# Patient Record
Sex: Female | Born: 1955
Health system: Southern US, Community
[De-identification: ages and names within clinical notes are randomized; demographics above are authoritative.]

## PROBLEM LIST (undated history)

## (undated) DIAGNOSIS — G4700 Insomnia, unspecified: Secondary | ICD-10-CM

## (undated) DIAGNOSIS — C9030 Solitary plasmacytoma not having achieved remission: Secondary | ICD-10-CM

## (undated) DIAGNOSIS — I10 Essential (primary) hypertension: Secondary | ICD-10-CM

## (undated) DIAGNOSIS — E079 Disorder of thyroid, unspecified: Secondary | ICD-10-CM

## (undated) DIAGNOSIS — N289 Disorder of kidney and ureter, unspecified: Secondary | ICD-10-CM

## (undated) HISTORY — DX: Essential (primary) hypertension: I10

## (undated) HISTORY — DX: Disorder of thyroid, unspecified: E07.9

## (undated) HISTORY — DX: Disorder of kidney and ureter, unspecified: N28.9

## (undated) HISTORY — DX: Insomnia, unspecified: G47.00

---

## 1999-08-22 ENCOUNTER — Other Ambulatory Visit: Admission: RE | Admit: 1999-08-22 | Discharge: 1999-08-22 | Payer: Self-pay | Admitting: Family Medicine

## 2002-03-11 ENCOUNTER — Other Ambulatory Visit: Admission: RE | Admit: 2002-03-11 | Discharge: 2002-03-11 | Payer: Self-pay | Admitting: Family Medicine

## 2004-05-07 ENCOUNTER — Other Ambulatory Visit: Admission: RE | Admit: 2004-05-07 | Discharge: 2004-05-07 | Payer: Self-pay | Admitting: Family Medicine

## 2005-12-03 ENCOUNTER — Other Ambulatory Visit: Admission: RE | Admit: 2005-12-03 | Discharge: 2005-12-03 | Payer: Self-pay | Admitting: Family Medicine

## 2007-03-16 ENCOUNTER — Other Ambulatory Visit: Admission: RE | Admit: 2007-03-16 | Discharge: 2007-03-16 | Payer: Self-pay | Admitting: Family Medicine

## 2008-03-02 ENCOUNTER — Ambulatory Visit: Payer: Self-pay | Admitting: Internal Medicine

## 2008-03-02 ENCOUNTER — Other Ambulatory Visit: Admission: RE | Admit: 2008-03-02 | Discharge: 2008-03-02 | Payer: Self-pay | Admitting: Internal Medicine

## 2008-06-12 ENCOUNTER — Ambulatory Visit: Payer: Self-pay | Admitting: Internal Medicine

## 2009-08-21 ENCOUNTER — Other Ambulatory Visit: Admission: RE | Admit: 2009-08-21 | Discharge: 2009-08-21 | Payer: Self-pay | Admitting: Internal Medicine

## 2009-08-27 ENCOUNTER — Ambulatory Visit: Payer: Self-pay | Admitting: Internal Medicine

## 2010-07-09 LAB — HM MAMMOGRAPHY

## 2010-07-30 ENCOUNTER — Encounter: Payer: Self-pay | Admitting: Internal Medicine

## 2010-07-31 ENCOUNTER — Encounter: Payer: Self-pay | Admitting: Internal Medicine

## 2010-08-02 ENCOUNTER — Encounter: Payer: Self-pay | Admitting: Internal Medicine

## 2010-08-02 ENCOUNTER — Ambulatory Visit (INDEPENDENT_AMBULATORY_CARE_PROVIDER_SITE_OTHER): Payer: 59 | Admitting: Internal Medicine

## 2010-08-02 VITALS — BP 158/98 | HR 76 | Temp 99.2°F | Ht 68.0 in | Wt 181.0 lb

## 2010-08-02 DIAGNOSIS — E039 Hypothyroidism, unspecified: Secondary | ICD-10-CM

## 2010-08-02 DIAGNOSIS — IMO0001 Reserved for inherently not codable concepts without codable children: Secondary | ICD-10-CM

## 2010-08-02 DIAGNOSIS — R030 Elevated blood-pressure reading, without diagnosis of hypertension: Secondary | ICD-10-CM

## 2010-08-02 DIAGNOSIS — E669 Obesity, unspecified: Secondary | ICD-10-CM

## 2010-08-02 DIAGNOSIS — L0100 Impetigo, unspecified: Secondary | ICD-10-CM

## 2010-08-03 ENCOUNTER — Encounter: Payer: Self-pay | Admitting: Internal Medicine

## 2010-08-03 DIAGNOSIS — E039 Hypothyroidism, unspecified: Secondary | ICD-10-CM | POA: Insufficient documentation

## 2010-08-03 NOTE — Progress Notes (Signed)
  Subjective:    Patient ID: Jordan Gregory, female    DOB: 12/27/1955, 55 y.o.   MRN: [id number]  HPI patient with history of hypothyroidism currently on thyroid replacement therapy. She had some sun exposure on her nose and is concerned about a lesion on her nose that has not healed over the past couple of weeks. Initially thought it was just sun burn or a pimple but it did not improve and she became worried. Also her blood pressure is elevated today at 158/98. Has has not been watching her blood pressure at time but says parents have a home monitor that she can start using. Suggested she start taking blood pressure several times a week and bring me some readings in the next few weeks. She is also going to start some salt restriction. He is overweight.    Review of Systems     Objective:   Physical Exam chest clear to auscultation; cardiac exam regular rate and rhythm, normal S1/S2, extremities without edema.  Nasal bridge: Has abraded area mid nasal bridge with some exudate.        Assessment & Plan:   Impetigo of nasal bridge status post sun exposure  Probable essential hypertension-patient is to keep blood pressure readings for a few weeks and contact me with these readings  Hypothyroidism  Patient will be placed on doxycycline 100 mg twice daily for 10 days. Apply Bactroban ointment to the nasal bridge twice daily after cleaning with peroxide gently. If not better in 2 weeks will refer to dermatologist.

## 2010-09-02 ENCOUNTER — Other Ambulatory Visit: Payer: 59 | Admitting: Internal Medicine

## 2010-09-02 DIAGNOSIS — Z0000 Encounter for general adult medical examination without abnormal findings: Secondary | ICD-10-CM

## 2010-09-02 LAB — COMPREHENSIVE METABOLIC PANEL
ALT: 11 U/L (ref 0–35)
AST: 16 U/L (ref 0–37)
Albumin: 4.6 g/dL (ref 3.5–5.2)
Calcium: 9.4 mg/dL (ref 8.4–10.5)
Chloride: 107 mEq/L (ref 96–112)
Potassium: 4.4 mEq/L (ref 3.5–5.3)
Sodium: 141 mEq/L (ref 135–145)

## 2010-09-02 LAB — CBC WITH DIFFERENTIAL/PLATELET
Basophils Absolute: 0 10*3/uL (ref 0.0–0.1)
Lymphocytes Relative: 31 % (ref 12–46)
Neutro Abs: 2.6 10*3/uL (ref 1.7–7.7)
Platelets: 193 10*3/uL (ref 150–400)
RDW: 13.2 % (ref 11.5–15.5)
WBC: 4.6 10*3/uL (ref 4.0–10.5)

## 2010-09-02 LAB — LIPID PANEL
HDL: 74 mg/dL (ref >39)
LDL Cholesterol: 90 mg/dL (ref 0–99)

## 2010-09-02 LAB — VITAMIN D 25 HYDROXY (VIT D DEFICIENCY, FRACTURES): Vit D, 25-Hydroxy: 38 ng/mL (ref 30–89)

## 2010-09-03 ENCOUNTER — Encounter: Payer: Self-pay | Admitting: Internal Medicine

## 2010-09-03 ENCOUNTER — Ambulatory Visit (INDEPENDENT_AMBULATORY_CARE_PROVIDER_SITE_OTHER): Payer: 59 | Admitting: Internal Medicine

## 2010-09-03 DIAGNOSIS — I10 Essential (primary) hypertension: Secondary | ICD-10-CM

## 2010-09-03 DIAGNOSIS — E039 Hypothyroidism, unspecified: Secondary | ICD-10-CM

## 2010-09-03 DIAGNOSIS — Z0000 Encounter for general adult medical examination without abnormal findings: Secondary | ICD-10-CM

## 2010-09-03 DIAGNOSIS — G4700 Insomnia, unspecified: Secondary | ICD-10-CM

## 2010-09-03 LAB — POCT URINALYSIS DIPSTICK
Bilirubin, UA: NEGATIVE
Blood, UA: NEGATIVE
Glucose, UA: NEGATIVE
Nitrite, UA: NEGATIVE
Spec Grav, UA: 1.02
Urobilinogen, UA: NEGATIVE
pH, UA: 5

## 2010-09-05 ENCOUNTER — Encounter: Payer: Self-pay | Admitting: Internal Medicine

## 2010-09-05 DIAGNOSIS — G4700 Insomnia, unspecified: Secondary | ICD-10-CM | POA: Insufficient documentation

## 2010-09-05 NOTE — Progress Notes (Signed)
  Subjective:    Patient ID: Jordan Gregory, female    DOB: 1955-02-13, 55 y.o.   MRN: [id number]  HPI  55 year old white female with history of hypothyroidism. Occasionally takes Ambien for insomnia. History of fractured wrist, fractured clavicle and fractured leg: Childhood. Patient has a Masters degree and is married. Does not smoke. Social alcohol consumption. Exercise consists of walking 5 times a week for an hour or so. She does some cycling. Never had screening colonoscopy. Last mammogram July 2012. Tetanus immunization given February 2010. History of vitamin D deficiency.  Father with history of arrhythmia, mother with history of arthritis. 2 brothers in good health. No children.    Review of Systems  Constitutional: Negative.   HENT: Negative.   Eyes: Negative.   Respiratory: Negative.   Cardiovascular: Negative.   Gastrointestinal: Negative.   Genitourinary: Negative.   Musculoskeletal: Negative.   Skin: Negative.   Neurological: Negative.   Hematological: Negative.   Psychiatric/Behavioral: Negative.        Objective:   Physical Exam  Constitutional: She is oriented to person, place, and time. She appears well-developed and well-nourished. No distress.  HENT:  Head: Normocephalic and atraumatic.  Right Ear: External ear normal.  Left Ear: External ear normal.  Mouth/Throat: Oropharynx is clear and moist.  Eyes: Conjunctivae and EOM are normal. Pupils are equal, round, and reactive to light. No scleral icterus.  Neck: Neck supple. No JVD present. No thyromegaly present.  Cardiovascular: Normal rate, regular rhythm, normal heart sounds and intact distal pulses.   Pulmonary/Chest: Effort normal and breath sounds normal. No respiratory distress. She has no rales.  Abdominal: Soft. Bowel sounds are normal. She exhibits no mass. There is no tenderness.  Genitourinary:       Last Pap 2011. Pap not done today  Musculoskeletal: She exhibits no edema.  Lymphadenopathy:      She has no cervical adenopathy.  Neurological: She is alert and oriented to person, place, and time. She has normal reflexes. No cranial nerve deficit.  Skin: Skin is warm and dry. No rash noted.  Psychiatric: She has a normal mood and affect. Her behavior is normal. Thought content normal.          Assessment & Plan:  Hypothyroidism  Insomnia  Refill Ambien 10 mg (#30 (1/2-1 by mouth each bedtime with one refill. Refill levothyroxin 0.05 mg daily for 6 months. Return in 6 months for office visit and recheck TSH. Patient is to let he know when she wants to have screening colonoscopy.  Hypertension patient brings in multiple blood pressure readings which have been elevated from 138-147 systolically over the last 2 weeks and diastolically from 87-104. We're going to start her on Altace 5 mg daily and reevaluate in 4-6 weeks with office visit and basic metabolic panel.

## 2010-10-03 ENCOUNTER — Encounter: Payer: Self-pay | Admitting: Internal Medicine

## 2010-10-03 ENCOUNTER — Ambulatory Visit (INDEPENDENT_AMBULATORY_CARE_PROVIDER_SITE_OTHER): Payer: 59 | Admitting: Internal Medicine

## 2010-10-03 VITALS — BP 134/82 | HR 76 | Resp 16 | Ht 68.0 in | Wt 178.0 lb

## 2010-10-03 DIAGNOSIS — I10 Essential (primary) hypertension: Secondary | ICD-10-CM | POA: Insufficient documentation

## 2010-10-03 DIAGNOSIS — R002 Palpitations: Secondary | ICD-10-CM

## 2010-10-03 LAB — BASIC METABOLIC PANEL
BUN: 21 mg/dL (ref 6–23)
Chloride: 102 mEq/L (ref 96–112)
Creat: 0.99 mg/dL (ref 0.50–1.10)
Glucose, Bld: 87 mg/dL (ref 70–99)
Potassium: 4.4 mEq/L (ref 3.5–5.3)

## 2010-10-03 NOTE — Progress Notes (Signed)
  Subjective:    Patient ID: Jordan Gregory, female    DOB: 07/15/1955, 55 y.o.   MRN: [id number]  HPI 55 year old white female with history of hypothyroidism, vitamin D deficiency, insomnia recently seen for elevated blood pressures and placed on Altace 5 mg daily. Returns today for followup. Says blood pressure at home and hasn't changed all that much. She's also had some issues with palpitations recently. This is of concern for her because in January she is planning to go to [place] for a three-week trip where she will be riding a bike 30 miles a day in the remote part of the country. No chest pain. No shortness of breath. Palpitations are self-limited. Not related to caffeine consumption or stress that she is aware of. EKG performed today. She is on thyroid replacement therapy consisting of levothyroxine 0.05 mg daily.    Review of Systems     Objective:   Physical Exam neck is supple without bruits or thyromegaly; chest clear; cardiac exam regular rate and rhythm; extremities without edema        Assessment & Plan:  Hypertension  Palpitations  Patient is considerably alarmed about palpitations and 1.2 refer her to cardiologist for further evaluation. This is because of her upcoming trip out of the country for an extended period of time.

## 2010-10-04 ENCOUNTER — Telehealth: Payer: Self-pay

## 2010-10-04 DIAGNOSIS — Z1211 Encounter for screening for malignant neoplasm of colon: Secondary | ICD-10-CM

## 2010-10-04 DIAGNOSIS — R002 Palpitations: Secondary | ICD-10-CM

## 2010-10-04 NOTE — Telephone Encounter (Signed)
Ambulatory ref to Cardiology and Gastroenterology made

## 2010-10-14 ENCOUNTER — Encounter: Payer: Self-pay | Admitting: Gastroenterology

## 2010-10-29 ENCOUNTER — Ambulatory Visit (AMBULATORY_SURGERY_CENTER): Payer: 59

## 2010-10-29 VITALS — Ht 68.0 in | Wt 180.8 lb

## 2010-10-29 DIAGNOSIS — Z1211 Encounter for screening for malignant neoplasm of colon: Secondary | ICD-10-CM

## 2010-10-29 DIAGNOSIS — Z8371 Family history of colonic polyps: Secondary | ICD-10-CM

## 2010-10-29 MED ORDER — PEG-KCL-NACL-NASULF-NA ASC-C 100 G PO SOLR: 1.0000 | Freq: Once | ORAL | Status: AC

## 2010-10-30 ENCOUNTER — Encounter: Payer: Self-pay | Admitting: Gastroenterology

## 2010-11-07 ENCOUNTER — Telehealth: Payer: Self-pay | Admitting: Internal Medicine

## 2010-11-07 NOTE — Telephone Encounter (Signed)
Phoned in ramipril 5 mg #90 with 1 additional refill to [facility]

## 2010-11-07 NOTE — Telephone Encounter (Signed)
At the end of the call, the patient stated she had ran out of medication 3 days ago and the pharmacy had given her 3 pills.  Stated the pharmacist told her they had been trying to reach our office without success.  Advised patient we had not been contacted by pharmacy and that she was the only person who had tried to contact us for a refill and that was today.

## 2010-11-11 ENCOUNTER — Encounter: Payer: Self-pay | Admitting: Gastroenterology

## 2010-11-11 ENCOUNTER — Ambulatory Visit (AMBULATORY_SURGERY_CENTER): Payer: 59 | Admitting: Gastroenterology

## 2010-11-11 DIAGNOSIS — Z1211 Encounter for screening for malignant neoplasm of colon: Secondary | ICD-10-CM

## 2010-11-11 DIAGNOSIS — Z8371 Family history of colonic polyps: Secondary | ICD-10-CM

## 2010-11-11 MED ORDER — SODIUM CHLORIDE 0.9 % IV SOLN: 500.0000 mL | INTRAVENOUS | Status: DC

## 2010-11-11 NOTE — Patient Instructions (Signed)
Please refer to the blue and neon green sheets for instructions regarding diet and activity for the rest of today.  Resume previous medications. 

## 2010-11-12 ENCOUNTER — Telehealth: Payer: Self-pay

## 2010-11-12 NOTE — Telephone Encounter (Signed)
No ID on answering machine. 

## 2011-01-08 ENCOUNTER — Other Ambulatory Visit: Payer: Self-pay | Admitting: Internal Medicine

## 2011-01-10 ENCOUNTER — Other Ambulatory Visit: Payer: Self-pay

## 2011-01-10 MED ORDER — ZOLPIDEM TARTRATE 10 MG PO TABS: 5.0000 mg | ORAL_TABLET | Freq: Every evening | ORAL | Status: DC | PRN

## 2011-04-26 ENCOUNTER — Other Ambulatory Visit: Payer: Self-pay | Admitting: Internal Medicine

## 2011-04-29 ENCOUNTER — Encounter: Payer: Self-pay | Admitting: Internal Medicine

## 2011-04-29 ENCOUNTER — Ambulatory Visit (INDEPENDENT_AMBULATORY_CARE_PROVIDER_SITE_OTHER): Payer: 59 | Admitting: Internal Medicine

## 2011-04-29 VITALS — BP 146/84 | HR 78 | Resp 16 | Ht 68.0 in | Wt 175.0 lb

## 2011-04-29 DIAGNOSIS — R002 Palpitations: Secondary | ICD-10-CM

## 2011-04-29 DIAGNOSIS — E039 Hypothyroidism, unspecified: Secondary | ICD-10-CM

## 2011-04-29 LAB — TSH: TSH: 2.315 u[IU]/mL (ref 0.350–4.500)

## 2011-04-29 NOTE — Patient Instructions (Addendum)
Thyroid functions have been checked today. We will make a referral to cardiologist regarding recurrent palpitations.

## 2011-05-04 ENCOUNTER — Encounter: Payer: Self-pay | Admitting: Internal Medicine

## 2011-05-04 NOTE — Progress Notes (Signed)
  Subjective:    Patient ID: Jordan Gregory, female    DOB: 1955/12/17, 56 y.o.   MRN: [id number]  HPI  56 year old white female in today complaining of some chest tightness and palpitations. Was seen September 2012 for palpitations. EKG at that time showed left axis deviation and borderline first degree AV block. Patient is concerned because she has family history of heart disease in her father. Seems a bit anxious. Is not taking over-the-counter decongestants, drinking alcohol or lots of caffeine. Is somewhat vague about history of palpitations and chest tightness. Is across her chest. Does not go into neck or down left arm. No nausea with this chest discomfort.      Review of Systems     Objective:   Physical Exam neck is supple without JVD thyromegaly or carotid bruits; chest clear to auscultation. Cardiac exam regular rate and rhythm normal S1 and S2 without murmurs gallops rubs or clicks; extremities without edema. EKG shows sinus rhythm rate of 80. PR interval 0.20, QRS 0.06, QT interval 0.36. Axis is 0.        Assessment & Plan:  Palpitations  Chest tightness  Plan: I think patient has some underlying anxiety causing the symptoms however she does have family history of heart disease in her father. We will refer her to cardiologist for further evaluation.

## 2011-05-12 ENCOUNTER — Ambulatory Visit (INDEPENDENT_AMBULATORY_CARE_PROVIDER_SITE_OTHER): Payer: 59 | Admitting: Cardiovascular Disease

## 2011-05-12 ENCOUNTER — Encounter: Payer: Self-pay | Admitting: Cardiovascular Disease

## 2011-05-12 VITALS — BP 154/89 | HR 72 | Ht 68.0 in | Wt 179.0 lb

## 2011-05-12 DIAGNOSIS — E039 Hypothyroidism, unspecified: Secondary | ICD-10-CM

## 2011-05-12 DIAGNOSIS — R079 Chest pain, unspecified: Secondary | ICD-10-CM

## 2011-05-12 DIAGNOSIS — R002 Palpitations: Secondary | ICD-10-CM

## 2011-05-12 DIAGNOSIS — I10 Essential (primary) hypertension: Secondary | ICD-10-CM

## 2011-05-12 NOTE — Assessment & Plan Note (Signed)
Continue synthroid.  TSH normal on labs reviewed 3/26 at [facility] office

## 2011-05-12 NOTE — Progress Notes (Signed)
Patient ID: Jordan Gregory, female   DOB: 08-27-1955, 56 y.o.   MRN: [id number] Referred by Dr Lenord Fellers.  HPI 56 year old white female in today complaining of some chest tightness and palpitations. Was seen September 2012 for palpitations. EKG at that time showed left axis deviation and borderline first degree AV block. Patient is concerned because she has family history of heart disease in her father. Seems a bit anxious. Is not taking over-the-counter decongestants, drinking alcohol or lots of caffeine. Is somewhat vague about history of palpitations and chest tightness. Is across her chest. Does not go into neck or down left arm. No nausea with this chest discomfort. She rides her bike and walks 3-4/week.  No correlation with SSCP.  Palpitations more at night and can wake her from sleep.  Has not had ETT before  BP Rx with meds over a year.  Previous patient of Dr Artis Flock  Works at [employer] and [employer]  ROS: Denies fever, malais, weight loss, blurry vision, decreased visual acuity, cough, sputum, SOB, hemoptysis, pleuritic pain, palpitaitons, heartburn, abdominal pain, melena, lower extremity edema, claudication, or rash.  All other systems reviewed and negative   General: Affect appropriate Healthy:  appears stated age HEENT: normal Neck supple with no adenopathy JVP normal no bruits no thyromegaly Lungs clear with no wheezing and good diaphragmatic motion Heart:  S1/S2 no murmur,rub, gallop or click PMI normal Abdomen: benighn, BS positve, no tenderness, no AAA no bruit.  No HSM or HJR Distal pulses intact with no bruits No edema Neuro non-focal Skin warm and dry No muscular weakness  Medications Current Outpatient Prescriptions  Medication Sig Dispense Refill  . ramipril (ALTACE) 5 MG capsule TAKE 1 CAPSULE BY MOUTH DAILY  90 capsule  1  . SYNTHROID 50 MCG tablet TAKE 1 TABLET EVERY DAY  90 tablet  3  . VITAMIN D, CHOLECALCIFEROL, PO Take 2,000 Int'l Units by mouth daily.        Marland Kitchen zolpidem (AMBIEN) 10 MG tablet Take 0.5 tablets (5 mg total) by mouth at bedtime as needed.  30 tablet  2    Allergies Review of patient's allergies indicates no known allergies.  Family History: Family History  Problem Relation Age of Onset  . Arthritis Mother   . Heart disease Father     Social History: History   Social History  . Marital Status: Married    Spouse Name: N/A    Number of Children: N/A  . Years of Education: N/A   Occupational History  . Not on file.   Social History Main Topics  . Smoking status: Never Smoker   . Smokeless tobacco: Never Used  . Alcohol Use: 7.0 oz/week    14 drink(s) per week     social  . Drug Use: No  . Sexually Active: Not on file   Other Topics Concern  . Not on file   Social History Narrative  . No narrative on file    Electrocardiogram:  NSR rate 72 normal ECG  Assessment and Plan

## 2011-05-12 NOTE — Assessment & Plan Note (Signed)
Atypical and anxiety may play a role. ECG no acute changes.  F/U stress echo

## 2011-05-12 NOTE — Assessment & Plan Note (Signed)
Benign no need for monitor unless stress echo abnormal

## 2011-05-12 NOTE — Patient Instructions (Signed)
Your physician recommends that you schedule a follow-up appointment in: AS NEEDED Your physician recommends that you continue on your current medications as directed. Please refer to the Current Medication list given to you today. Your physician has requested that you have a stress echocardiogram. For further information please visit [email or web address] Please follow instruction sheet as given. DX PALPITATIONS

## 2011-05-12 NOTE — Assessment & Plan Note (Signed)
Well controlled.  Continue current medications and low sodium Dash type diet.   Continue ACE/ARB

## 2011-05-29 ENCOUNTER — Ambulatory Visit (HOSPITAL_COMMUNITY): Payer: 59 | Attending: Cardiovascular Disease

## 2011-05-29 ENCOUNTER — Encounter: Payer: Self-pay | Admitting: Cardiovascular Disease

## 2011-05-29 DIAGNOSIS — R002 Palpitations: Secondary | ICD-10-CM | POA: Insufficient documentation

## 2011-05-29 DIAGNOSIS — I10 Essential (primary) hypertension: Secondary | ICD-10-CM | POA: Insufficient documentation

## 2011-05-29 DIAGNOSIS — R072 Precordial pain: Secondary | ICD-10-CM | POA: Insufficient documentation

## 2011-06-04 ENCOUNTER — Telehealth: Payer: Self-pay | Admitting: Cardiovascular Disease

## 2011-06-04 NOTE — Telephone Encounter (Signed)
[phone] PT RTN CALL RE STRESS TEST RESULTS

## 2011-06-04 NOTE — Telephone Encounter (Signed)
PT  AWARE OF STRESS ECHO RESULTS ./CY 

## 2011-08-01 ENCOUNTER — Encounter: Payer: Self-pay | Admitting: Internal Medicine

## 2011-08-01 ENCOUNTER — Ambulatory Visit (INDEPENDENT_AMBULATORY_CARE_PROVIDER_SITE_OTHER): Payer: 59 | Admitting: Internal Medicine

## 2011-08-01 VITALS — BP 148/90 | HR 80 | Temp 98.8°F | Ht 68.0 in | Wt 180.0 lb

## 2011-08-01 DIAGNOSIS — N390 Urinary tract infection, site not specified: Secondary | ICD-10-CM

## 2011-08-01 LAB — POCT URINALYSIS DIPSTICK
Bilirubin, UA: NEGATIVE
Glucose, UA: NEGATIVE
Ketones, UA: NEGATIVE
Nitrite, UA: NEGATIVE
pH, UA: 6

## 2011-08-01 NOTE — Addendum Note (Signed)
Addended by: Judy Pimple on: 08/01/2011 05:22 PM   Modules accepted: Orders

## 2011-08-01 NOTE — Progress Notes (Signed)
  Subjective:    Patient ID: Jordan Gregory, female    DOB: [birth year], 56 y.o.   MRN: [id number]  HPI 56 year old white female with onset of dysuria and urinary frequency on Sunday, July 21. She drank a lot of cranberry juice and thought she was getting better. H however, her symptoms have returned. She took some Azo-Standard over-the-counter for dysuria. No fever nausea vomiting or shaking chills. No back pain.    Review of Systems     Objective:   Physical Exam no CVA tenderness. Chest clear to auscultation. Cardiac exam regular rate and rhythm. Urine is abnormal. Culture taken.       Assessment & Plan:  Urinary tract infection  Plan: Cipro 250 mg twice daily for 10 days. Await culture results.

## 2011-08-01 NOTE — Patient Instructions (Addendum)
Take Cipro twice daily for 10 days. Call if not better in 48 hours or sooner if worse. Culture is pending.

## 2011-08-04 LAB — URINE CULTURE: Colony Count: >=100000

## 2011-08-15 ENCOUNTER — Other Ambulatory Visit: Payer: Self-pay

## 2011-08-15 MED ORDER — ZOLPIDEM TARTRATE 10 MG PO TABS: 10.0000 mg | ORAL_TABLET | Freq: Every evening | ORAL | Status: DC | PRN

## 2011-11-09 ENCOUNTER — Other Ambulatory Visit: Payer: Self-pay | Admitting: Internal Medicine

## 2012-01-02 NOTE — Addendum Note (Signed)
Addended by: Judy Pimple on: 01/02/2012 12:33 PM   Modules accepted: Orders

## 2012-01-26 ENCOUNTER — Encounter: Payer: Self-pay | Admitting: Radiology

## 2012-02-04 ENCOUNTER — Other Ambulatory Visit: Payer: Self-pay | Admitting: Internal Medicine

## 2012-02-04 NOTE — Telephone Encounter (Signed)
Last CPE 8/12. Last TSH 4/13. Needs OV and TSH check and can book PE after that.

## 2012-02-05 ENCOUNTER — Telehealth: Payer: Self-pay

## 2012-02-05 MED ORDER — SYNTHROID 50 MCG PO TABS: 50.0000 ug | ORAL_TABLET | Freq: Every day | ORAL | Status: DC

## 2012-02-05 NOTE — Telephone Encounter (Signed)
Note done

## 2012-02-11 ENCOUNTER — Other Ambulatory Visit: Payer: Self-pay

## 2012-02-11 MED ORDER — SYNTHROID 50 MCG PO TABS: 50.0000 ug | ORAL_TABLET | Freq: Every day | ORAL | Status: DC

## 2012-03-16 ENCOUNTER — Other Ambulatory Visit: Payer: 59 | Admitting: Internal Medicine

## 2012-03-16 DIAGNOSIS — E039 Hypothyroidism, unspecified: Secondary | ICD-10-CM

## 2012-03-16 DIAGNOSIS — Z0000 Encounter for general adult medical examination without abnormal findings: Secondary | ICD-10-CM

## 2012-03-16 LAB — LIPID PANEL
Cholesterol: 191 mg/dL (ref 0–200)
VLDL: 11 mg/dL (ref 0–40)

## 2012-03-16 LAB — CBC WITH DIFFERENTIAL/PLATELET
Hemoglobin: 13.4 g/dL (ref 12.0–15.0)
Lymphocytes Relative: 33 % (ref 12–46)
Lymphs Abs: 1.2 10*3/uL (ref 0.7–4.0)
Neutrophils Relative %: 53 % (ref 43–77)
Platelets: 219 10*3/uL (ref 150–400)
RBC: 4.63 MIL/uL (ref 3.87–5.11)
WBC: 3.7 10*3/uL — ABNORMAL LOW (ref 4.0–10.5)

## 2012-03-16 LAB — COMPREHENSIVE METABOLIC PANEL
ALT: 14 U/L (ref 0–35)
Albumin: 4.6 g/dL (ref 3.5–5.2)
CO2: 28 mEq/L (ref 19–32)
Calcium: 9.2 mg/dL (ref 8.4–10.5)
Chloride: 105 mEq/L (ref 96–112)
Potassium: 4.1 mEq/L (ref 3.5–5.3)
Sodium: 141 mEq/L (ref 135–145)
Total Protein: 6.6 g/dL (ref 6.0–8.3)

## 2012-03-17 LAB — VITAMIN D 25 HYDROXY (VIT D DEFICIENCY, FRACTURES): Vit D, 25-Hydroxy: 53 ng/mL (ref 30–89)

## 2012-03-18 ENCOUNTER — Ambulatory Visit: Payer: 59 | Admitting: Internal Medicine

## 2012-03-18 ENCOUNTER — Other Ambulatory Visit (HOSPITAL_COMMUNITY)
Admission: RE | Admit: 2012-03-18 | Discharge: 2012-03-18 | Disposition: A | Discharge status: Home / Self Care | Payer: 59 | Source: Ambulatory Visit | Attending: Internal Medicine | Admitting: Internal Medicine

## 2012-03-18 ENCOUNTER — Encounter: Payer: Self-pay | Admitting: Internal Medicine

## 2012-03-18 VITALS — BP 140/90 | HR 76 | Ht 67.25 in

## 2012-03-18 DIAGNOSIS — Z01419 Encounter for gynecological examination (general) (routine) without abnormal findings: Secondary | ICD-10-CM | POA: Insufficient documentation

## 2012-03-18 DIAGNOSIS — M25559 Pain in unspecified hip: Secondary | ICD-10-CM

## 2012-03-18 DIAGNOSIS — M25551 Pain in right hip: Secondary | ICD-10-CM

## 2012-03-18 DIAGNOSIS — I10 Essential (primary) hypertension: Secondary | ICD-10-CM

## 2012-03-18 DIAGNOSIS — R21 Rash and other nonspecific skin eruption: Secondary | ICD-10-CM

## 2012-03-18 DIAGNOSIS — E039 Hypothyroidism, unspecified: Secondary | ICD-10-CM

## 2012-03-18 DIAGNOSIS — Z0000 Encounter for general adult medical examination without abnormal findings: Secondary | ICD-10-CM

## 2012-03-18 DIAGNOSIS — L259 Unspecified contact dermatitis, unspecified cause: Secondary | ICD-10-CM

## 2012-03-18 LAB — POCT URINALYSIS DIPSTICK
Bilirubin, UA: NEGATIVE
Glucose, UA: NEGATIVE
Leukocytes, UA: NEGATIVE
Nitrite, UA: NEGATIVE
pH, UA: 5.5

## 2012-03-18 NOTE — Patient Instructions (Addendum)
Prescription written 3 weeks of physical therapy for right hip pain. For rash on the upper back use triamcinolone cream twice daily. Continue same antihypertensive medication. TSH is normal and continue thyroid replacement therapy. Cough may be caused by your antihypertensive medication. Change to Cozaar 50 mg daily and return in 3 weeks.

## 2012-03-19 ENCOUNTER — Other Ambulatory Visit: Payer: Self-pay | Admitting: Internal Medicine

## 2012-03-19 NOTE — Telephone Encounter (Signed)
Refill for 6 months. 

## 2012-04-08 ENCOUNTER — Encounter: Payer: Self-pay | Admitting: Internal Medicine

## 2012-04-08 ENCOUNTER — Ambulatory Visit (INDEPENDENT_AMBULATORY_CARE_PROVIDER_SITE_OTHER): Payer: 59 | Admitting: Internal Medicine

## 2012-04-08 VITALS — BP 130/70 | Temp 98.7°F | Wt 184.0 lb

## 2012-04-08 DIAGNOSIS — I10 Essential (primary) hypertension: Secondary | ICD-10-CM

## 2012-04-08 LAB — BASIC METABOLIC PANEL
BUN: 17 mg/dL (ref 6–23)
CO2: 26 mEq/L (ref 19–32)
Glucose, Bld: 96 mg/dL (ref 70–99)
Potassium: 3.7 mEq/L (ref 3.5–5.3)

## 2012-04-08 MED ORDER — LOSARTAN POTASSIUM 50 MG PO TABS: 50.0000 mg | ORAL_TABLET | Freq: Every day | ORAL | Status: DC

## 2012-04-08 NOTE — Progress Notes (Signed)
  Subjective:    Patient ID: Jordan Gregory, female    DOB: 28-Mar-1955, 57 y.o.   MRN: [id number]  HPI  At last visit, changed from ramipril  to Cozaar 50 mg daily because of cough. In today for followup on hypertension on Cozaar. Tolerating Cozaar fine without side effects. Drew B-met today. Still having bilateral hip pain. Has order for physical therapy but has not gone.    Review of Systems     Objective:   Physical Exam neck is supple without JVD thyromegaly or carotid bruits. Chest clear to auscultation. Cardiac exam regular rate and rhythm normal S1 and S2. Extremities without edema.        Assessment & Plan:  Hypertension-well controlled on Cozaar 50 mg daily.  Intolerant to Ramipril because of cough  Bilateral hip pain-previously given prescription for physical therapy  Plan: Return in 6 months. B- met drawn.  Time spent with patient: 15 minutes

## 2012-04-08 NOTE — Patient Instructions (Addendum)
Continue Cozaar and return in 6 months.

## 2012-04-21 ENCOUNTER — Other Ambulatory Visit: Payer: Self-pay

## 2012-04-21 MED ORDER — SYNTHROID 50 MCG PO TABS: 50.0000 ug | ORAL_TABLET | Freq: Every day | ORAL | Status: DC

## 2012-05-10 ENCOUNTER — Other Ambulatory Visit: Payer: Self-pay

## 2012-05-10 DIAGNOSIS — I10 Essential (primary) hypertension: Secondary | ICD-10-CM

## 2012-05-10 MED ORDER — LOSARTAN POTASSIUM 50 MG PO TABS: 50.0000 mg | ORAL_TABLET | Freq: Every day | ORAL | Status: DC

## 2012-07-20 ENCOUNTER — Encounter: Payer: Self-pay | Admitting: Internal Medicine

## 2012-07-20 ENCOUNTER — Ambulatory Visit (INDEPENDENT_AMBULATORY_CARE_PROVIDER_SITE_OTHER): Payer: 59 | Admitting: Internal Medicine

## 2012-07-20 VITALS — BP 140/84 | HR 68 | Temp 99.0°F | Wt 182.0 lb

## 2012-07-20 DIAGNOSIS — R109 Unspecified abdominal pain: Secondary | ICD-10-CM

## 2012-07-20 DIAGNOSIS — R319 Hematuria, unspecified: Secondary | ICD-10-CM

## 2012-07-20 DIAGNOSIS — R1012 Left upper quadrant pain: Secondary | ICD-10-CM

## 2012-07-20 DIAGNOSIS — F411 Generalized anxiety disorder: Secondary | ICD-10-CM

## 2012-07-20 DIAGNOSIS — R1032 Left lower quadrant pain: Secondary | ICD-10-CM

## 2012-07-20 LAB — POCT URINALYSIS DIPSTICK
Bilirubin, UA: NEGATIVE
Ketones, UA: NEGATIVE
Protein, UA: NEGATIVE
Spec Grav, UA: 1.01
pH, UA: 5.5

## 2012-07-20 NOTE — Patient Instructions (Addendum)
Advance diet slowly. Call If  symptoms persist.

## 2012-07-20 NOTE — Progress Notes (Signed)
  Subjective:    Patient ID: Jordan Gregory, female    DOB: [birth year], 57 y.o.   MRN: [id number]  HPI  Patient noticed some discomfort last week on Friday, July 11. Had some epigastric and left-sided abdominal pain extending from her left diaphragm area down to her left lower quadrant. Had some burping. Had decreased appetite. Had no diarrhea or nausea. Bowel movements were essentially normal. In late June she finished physical therapy for back and hip pain. She has been doing some exercises and thought she may have had a muscle strain in her left abdomen. Recently a close friend was diagnosed with stage IV ovarian cancer. This has her concerned.  No urinary symptoms. She is menopausal. She had a Pap smear which was normal in March 2014.    Review of Systems     Objective:   Physical Exam Chest is clear to auscultation. No palpable left anterior chest wall tenderness. Abdomen is soft nondistended. Bowel sounds are decreased. No hepatosplenomegaly masses or significant tenderness. Bimanual exam is normal without masses. Rectovaginal confirms. No stool to guaiac. Skin is warm and dry. She seems anxious. Blood pressure is elevated today I assume because of anxiety      Assessment & Plan:  Vague epigastric and left abdominal discomfort. Etiology unclear. May have element of GE reflux and element of musculoskeletal pain. Also seems to have an element of anxiety.  Anxiety  Elevated blood pressure likely secondary to anxiety continue to monitor  Plan: Patient was reassured no masses or palpable. She is going to advance diet slowly over the next few days and advise if symptoms do not resolve. Says she feels better today than last week. Right now, do not see a reason to order ultrasound of the ovaries/CT of abdomen. Would like to see if patient improves with conservative therapy. If she's having reflux symptoms may take Zantac or Nexium over-the-counter. Patient has abnormal urine dipstick. We'll  send for microscopic urine and culture. Urine in March 2014 was normal.  25 minutes spent with patient

## 2012-07-21 LAB — URINALYSIS, ROUTINE W REFLEX MICROSCOPIC
Bilirubin Urine: NEGATIVE
Glucose, UA: NEGATIVE mg/dL
Ketones, ur: NEGATIVE mg/dL
Specific Gravity, Urine: 1.011 (ref 1.005–1.030)
pH: 5.5 (ref 5.0–8.0)

## 2012-07-21 LAB — URINALYSIS, MICROSCOPIC ONLY
Crystals: NONE SEEN
Squamous Epithelial / LPF: NONE SEEN

## 2012-07-22 LAB — URINE CULTURE: Organism ID, Bacteria: NO GROWTH

## 2012-08-01 ENCOUNTER — Encounter: Payer: Self-pay | Admitting: Internal Medicine

## 2012-08-01 NOTE — Progress Notes (Signed)
Subjective:    Patient ID: Jordan Gregory, female    DOB: Jan 09, 1955, 57 y.o.   MRN: [id number]  HPI 57 year old White female in today for health maintenance and evaluation of medical problems including hypertension and hypothyroidism. History of insomnia and palpitations. Patient says she is unable to tolerate Ramipril because of cough. History of palpitations  evaluated by Dr. Eden Emms in the past and thought to be benign. Had negative stress echocardiogram with his evaluation. Last Pap smear 2011. Last mammogram  January 2014.  Colonoscopy by Dr. Jarold Motto 2012. No polyps identified. 10 year followup recommended.  Hypertension diagnosed in 2012. History of insomnia and Vitamin D deficiency.  Had tetanus immunization February 2010  No known drug allergies  History of fractured wrist, fractured clavicle and fractured leg all in  Childhood.  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 She is married. No children. Does not smoke. Social alcohol consumption. Exercise consists of walking about an hour 5 times a week and she does some cycling.  Family history: Father with history of arrhythmia. Mother with history of arthritis. 2 brothers in good health.  History of right shoulder pain evaluated by Dr. Teressa Senter in 2009 showing type II acromion with impingement. Physical therapy recommended.  New complaints today: Right hip pain and nonspecific rash upper back   Review of Systems  Constitutional: Negative.   HENT: Negative.   Respiratory:       Has developed cough on Altace  Cardiovascular:       History of palpitations thought to be benign  Gastrointestinal: Negative.   Endocrine:       History of hypothyroidism  Genitourinary:       Menopausal  Psychiatric/Behavioral:       History of insomnia       Objective:   Physical Exam  Vitals reviewed. Constitutional: She is oriented to person, place, and time. She appears well-developed and  well-nourished. No distress.  HENT:  Head: Normocephalic and atraumatic.  Right Ear: External ear normal.  Left Ear: External ear normal.  Mouth/Throat: Oropharynx is clear and moist. No oropharyngeal exudate.  Eyes: Conjunctivae and EOM are normal. Pupils are equal, round, and reactive to light. Right eye exhibits no discharge. Left eye exhibits no discharge. No scleral icterus.  Neck: Neck supple. No JVD present. No thyromegaly present.  Cardiovascular: Normal rate, regular rhythm and normal heart sounds.   No murmur heard. Pulmonary/Chest: Effort normal and breath sounds normal. No respiratory distress. She has no wheezes. She has no rales. She exhibits no tenderness.  Breasts normal female  Abdominal: Soft. Bowel sounds are normal. She exhibits no distension and no mass. There is no tenderness. There is no rebound and no guarding.  Genitourinary: No vaginal discharge found.  Pap taken no masses on bimanual  Musculoskeletal: Normal range of motion. She exhibits no edema and no tenderness.  Good range of motion right hip but pain with rotation  Lymphadenopathy:    She has no cervical adenopathy.  Neurological: She is alert and oriented to person, place, and time. She has normal reflexes. No cranial nerve deficit. Coordination normal.  Skin: Skin is warm and dry. Rash noted. She is not diaphoretic.  Upper back scattered fine macular papular rash consistent with folliculitis  Psychiatric: She has a normal mood and affect. Her behavior is normal. Judgment and thought content normal.          Assessment & Plan:  Cough on Altace-change to Cozaar 50  mg daily  Hypothyroidism-continue same dose of Synthroid and recheck in 6 months  History of vitamin D deficiency  History of palpitations previously seen by cardiologist and thought to be benign  History of insomnia  History of hypertension diagnosed 2012  History of type II acromion with impingement 2009  Nonspecific  dermatitis-use triamcinolone cream upper back 3 times daily  Right hip pain-musculoskeletal-prescription given for physical therapy for 3 weeks  Plan: Recheck blood pressure on Cozaar 50 mg daily in 3 weeks and recheck hip pain at that time

## 2012-08-07 ENCOUNTER — Ambulatory Visit (INDEPENDENT_AMBULATORY_CARE_PROVIDER_SITE_OTHER): Payer: 59 | Admitting: Internal Medicine

## 2012-08-07 ENCOUNTER — Ambulatory Visit: Payer: 59

## 2012-08-07 VITALS — BP 135/72 | HR 71 | Temp 98.6°F | Resp 18 | Ht 67.5 in | Wt 183.0 lb

## 2012-08-07 DIAGNOSIS — K921 Melena: Secondary | ICD-10-CM

## 2012-08-07 DIAGNOSIS — R1032 Left lower quadrant pain: Secondary | ICD-10-CM

## 2012-08-07 DIAGNOSIS — N950 Postmenopausal bleeding: Secondary | ICD-10-CM

## 2012-08-07 DIAGNOSIS — R1013 Epigastric pain: Secondary | ICD-10-CM

## 2012-08-07 LAB — POCT CBC
HCT, POC: 46.6 % (ref 37.7–47.9)
Hemoglobin: 15.1 g/dL (ref 12.2–16.2)
Lymph, poc: 1 (ref 0.6–3.4)
MCH, POC: 30.1 pg (ref 27–31.2)
MCHC: 32.4 g/dL (ref 31.8–35.4)
MCV: 92.8 fL (ref 80–97)
POC Granulocyte: 3.1 (ref 2–6.9)
POC LYMPH PERCENT: 22.9 %L (ref 10–50)
RDW, POC: 13.4 %
WBC: 4.4 10*3/uL — AB (ref 4.6–10.2)

## 2012-08-07 LAB — IFOBT (OCCULT BLOOD): IFOBT: NEGATIVE

## 2012-08-07 LAB — POCT SEDIMENTATION RATE: POCT SED RATE: 14 mm/hr (ref 0–22)

## 2012-08-07 IMAGING — CR DG ABDOMEN 1V
1 series · 1 of 1 positions shown · non-contrast
Comparison: None.

CLINICAL DATA: LLQ abdominal pain

ABDOMEN - 1 VIEW

[AP]
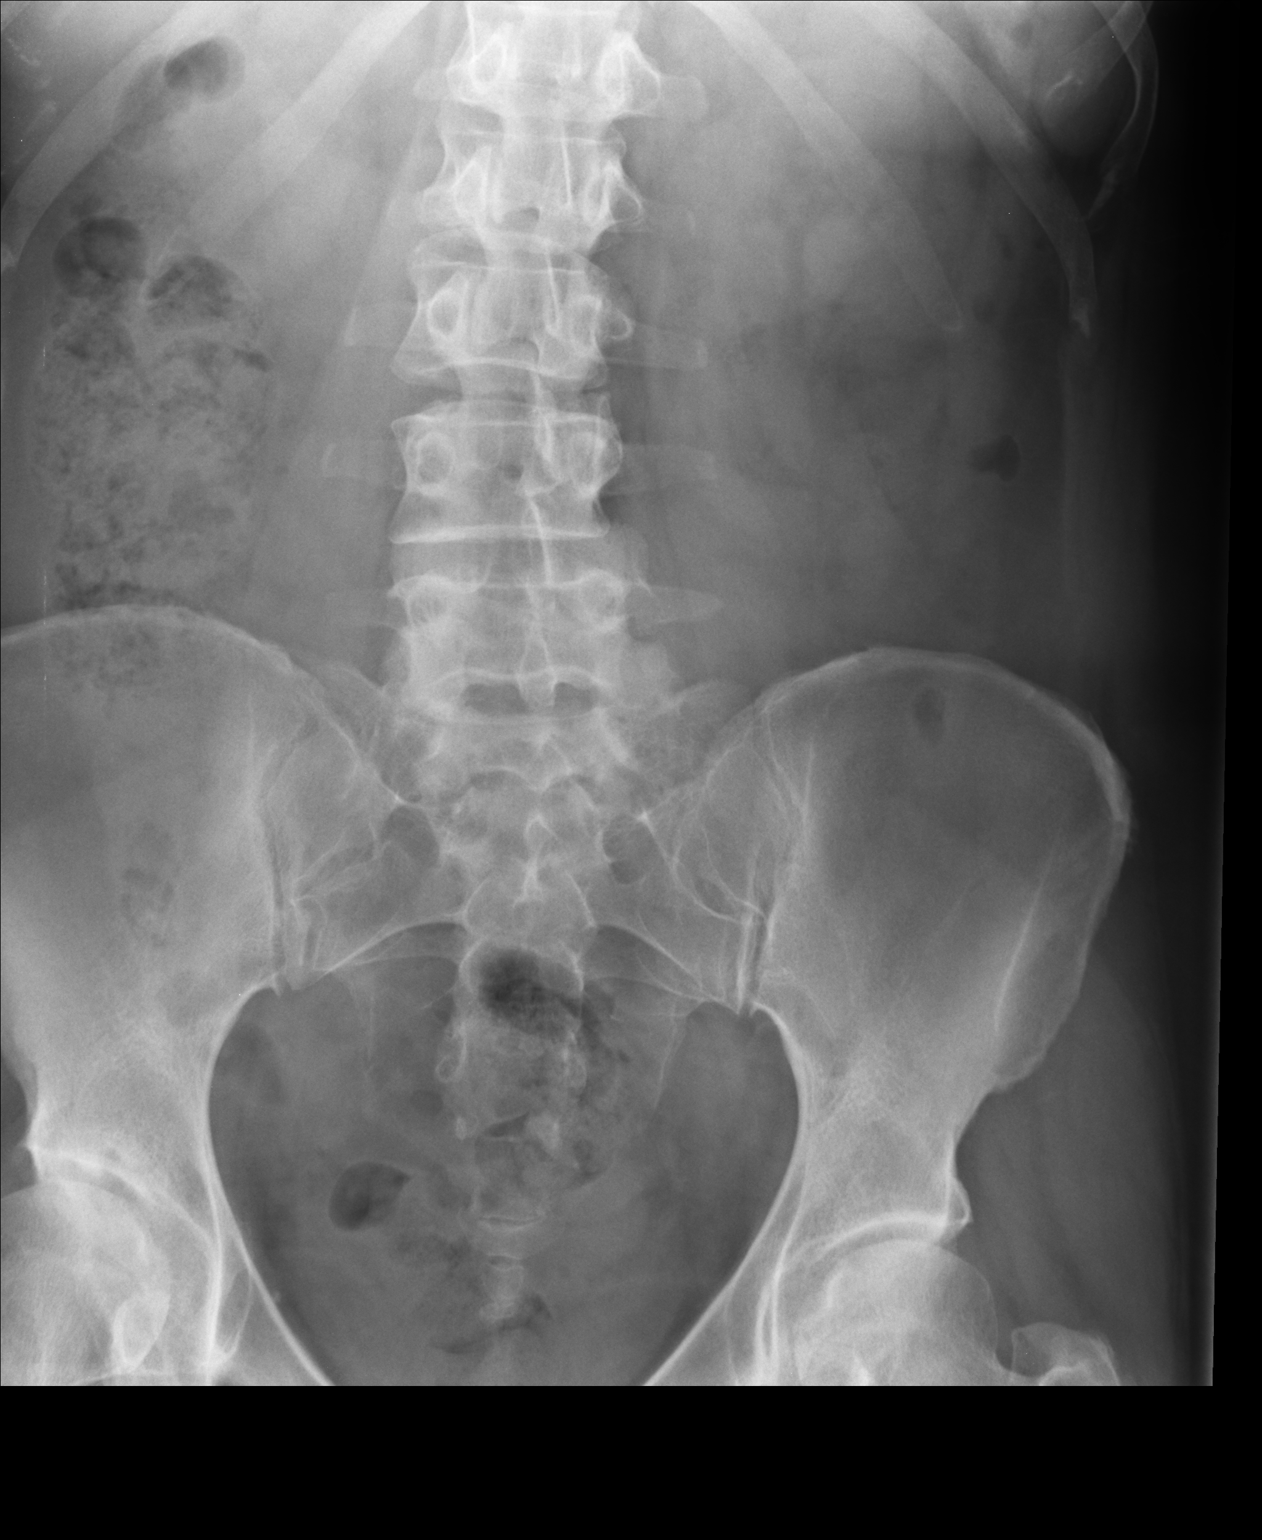

[1 of 1 positions shown; findings below may reference images not displayed]

FINDINGS: Nonobstructive bowel gas pattern.

Visualized osseous structures are within normal limits.
IMPRESSION: Unremarkable abdominal radiograph.

Clinically significant discrepancy from primary report, if
provided: None

## 2012-08-07 NOTE — Progress Notes (Signed)
Subjective:    Patient ID: Jordan Gregory, female    DOB: 03-20-1955, 57 y.o.   MRN: [id number]  HPI complaining of continuing abdominal pain for one month specifically in the left lower and middle quadrants. This started with epigastric involvement with the left lower quadrant. No nausea vomiting diarrhea or constipation. No dysuria or frequency. She had some epigastric burning at the onset but this has resolved. No history of NSAIDs. No problems her. Pain not made worse by food. No nocturnal pain. After evaluation by her primary care provider Dr. Eden Emms [place] she tried Gas-X with no relief. Over the past 3 weeks she has had episodes of dark black stools. Today she noticed vaginal bleeding of dark blood. She is 18 months post menopause. No vaginal discharge or dyspareunia.  Past medical history hypothyroidism on Synthroid/hypertension on Cozaar/insomnia on Ambien  Review of Systems  Constitutional: Negative for fever, chills, activity change, appetite change, fatigue and unexpected weight change.  Respiratory: Negative for cough and shortness of breath.   Cardiovascular: Negative for chest pain.  Genitourinary: Negative for dysuria, frequency, hematuria, flank pain and pelvic pain.  Musculoskeletal: Negative for back pain.  Skin: Negative for rash.  Hematological: Negative for adenopathy. Does not bruise/bleed easily.       Objective:   Physical Exam  Constitutional: She is oriented to person, place, and time. She appears well-developed and well-nourished.  Neck: Neck supple. No thyromegaly present.  Cardiovascular: Normal rate, regular rhythm, normal heart sounds and intact distal pulses.   No murmur heard. Pulmonary/Chest: Effort normal and breath sounds normal.  Abdominal:  The abdomen is soft nontender and nondistended except for very mild tenderness in the left lower quadrant. There is no rebound tenderness or guarding. No hepatomegaly or splenomegaly. Bowel sounds are  normal. Rectal exam reveals no masses and stool is heme negative  Genitourinary:  The introitus is clear although there is dark red blood in the vault. No active bleeding from the os. Uterus is mid position and nontender. There are no adnexal masses or tenderness.  Musculoskeletal: She exhibits no edema.  Lymphadenopathy:    She has no cervical adenopathy.  Neurological: She is alert and oriented to person, place, and time.  Psychiatric: She has a normal mood and affect. Her behavior is normal. Judgment and thought content normal.      [facility] reading (PRIMARY) by  Dr. Gilad Dugger=NAD   Results for orders placed in visit on 08/07/12  POCT CBC      Result Value Range   WBC 4.4 (*) 4.6 - 10.2 K/uL   Lymph, poc 1.0  0.6 - 3.4   POC LYMPH PERCENT 22.9  10 - 50 %L   MID (cbc) 0.3  0 - 0.9   POC MID % 6.3  0 - 12 %M   POC Granulocyte 3.1  2 - 6.9   Granulocyte percent 70.8  37 - 80 %G   RBC 5.02  4.04 - 5.48 M/uL   Hemoglobin 15.1  12.2 - 16.2 g/dL   HCT, POC 82.9  56.2 - 47.9 %   MCV 92.8  80 - 97 fL   MCH, POC 30.1  27 - 31.2 pg   MCHC 32.4  31.8 - 35.4 g/dL   RDW, POC 13.0     Platelet Count, POC 253  142 - 424 K/uL   MPV 8.7  0 - 99.8 fL  IFOBT (OCCULT BLOOD)      Result Value Range   IFOBT Negative  Assessment & Plan:  Problem #1 left lower quadrant pain Problem #2 postmenopausal bleeding  Sed rate/metabolic profile Refer to GYN for transvaginal ultrasound and consideration of endometrial biopsy Discussed that the pain in the vaginal bleeding may not be related

## 2012-08-08 LAB — COMPREHENSIVE METABOLIC PANEL
BUN: 13 mg/dL (ref 6–23)
CO2: 23 mEq/L (ref 19–32)
Calcium: 9.4 mg/dL (ref 8.4–10.5)
Chloride: 108 mEq/L (ref 96–112)
Creat: 1.08 mg/dL (ref 0.50–1.10)
Glucose, Bld: 101 mg/dL — ABNORMAL HIGH (ref 70–99)
Total Bilirubin: 0.4 mg/dL (ref 0.3–1.2)

## 2012-08-10 LAB — HELICOBACTER PYLORI  ANTIBODY, IGM: Helicobacter pylori, IgM: 3.4 U/mL (ref <9.0)

## 2012-08-19 ENCOUNTER — Ambulatory Visit (INDEPENDENT_AMBULATORY_CARE_PROVIDER_SITE_OTHER): Payer: 59 | Admitting: Gynecology

## 2012-08-19 ENCOUNTER — Encounter: Payer: Self-pay | Admitting: Gynecology

## 2012-08-19 VITALS — BP 130/88 | Ht 67.25 in | Wt 182.0 lb

## 2012-08-19 DIAGNOSIS — R102 Pelvic and perineal pain: Secondary | ICD-10-CM | POA: Insufficient documentation

## 2012-08-19 DIAGNOSIS — N950 Postmenopausal bleeding: Secondary | ICD-10-CM | POA: Insufficient documentation

## 2012-08-19 DIAGNOSIS — N949 Unspecified condition associated with female genital organs and menstrual cycle: Secondary | ICD-10-CM

## 2012-08-19 NOTE — Progress Notes (Signed)
Patient is a 57 year old who was referred for practice today from Dr. Merla Riches as a result of patient's recent episode of postmenopausal bleeding and lower abdominal pains. Patient states that she has been menopausal for several years. She has never suffered from major vasomotor symptoms and has never been on hormone replacement therapy. She denies any dyspareunia. She noticed this past weekend that she had bleeding for a couple of days very light. She is complaining at times feeling bloated pain is lower abdomen sometimes left upper quadrant.  Menarche age 84 No children No family history of GYN malignancy No history of hormone replacement therapy Oral contraceptive pills at a younger age  Normal Pap smear and mammogram reported January of this year.  Exam: Abdomen: Soft nontender no rebound guarding Pelvic: Bartholin urethra Skene was within normal limits Vagina: No lesions or discharge Cervix: No lesions or discharge Uterus: Anteverted normal size shape and consistency Adnexa: No palpable masses or tenderness Rectal exam not done  Patient was counseled for endometrial biopsy as part of the evaluation for postmenopausal bleeding. The cervix was cleansed with Betadine solution. A sterile Pipelle was introduced into the intrauterine cavity. The uterus sounded to 7 cm. Very little endometrial tissue was obtained but a little that was collected was submitted for histological evaluation.  Assessment/plan: Patient with postmenopausal bleeding. Endometrial biopsy today results pending at time of this dictation. Patient returned back to the office within the next week for a sonohysterogram to rule out any intracavitary defect and for better assessment of her adnexa.

## 2012-08-19 NOTE — Patient Instructions (Addendum)
Endometrial Biopsy This is a test in which a tissue sample (a biopsy) is taken from inside the uterus (womb). It is then looked at by a specialist under a microscope to see if the tissue is normal or abnormal. The endometrium is the lining of the uterus. This test helps determine where you are in your menstrual cycle and how hormone levels are affecting the lining of the uterus. Another use for this test is to diagnose endometrial cancer, tuberculosis, polyps, or inflammatory conditions and to evaluate uterine bleeding. PREPARATION FOR TEST No preparation or fasting is necessary. NORMAL FINDINGS No pathologic conditions. Presence of "secretory-type" endometrium 3 to 5 days before to normal menstruation. Ranges for normal findings may vary among different laboratories and hospitals. You should always check with your doctor after having lab work or other tests done to discuss the meaning of your test results and whether your values are considered within normal limits. MEANING OF TEST  Your caregiver will go over the test results with you and discuss the importance and meaning of your results, as well as treatment options and the need for additional tests if necessary. OBTAINING THE TEST RESULTS It is your responsibility to obtain your test results. Ask the lab or department performing the test when and how you will get your results. Document Released: 04/25/2004 Document Revised: 03/17/2011 Document Reviewed: 12/03/2007 [facility] Patient Information 2014 [place], [place]. Transvaginal Ultrasound Transvaginal ultrasound is a pelvic ultrasound, using a metal probe that is placed in the vagina, to look at a women's female organs. Transvaginal ultrasound is a method of seeing inside the pelvis of a woman. The ultrasound machine sends out sound waves from the transducer (probe). These sound waves bounce off body structures (like an echo) to create a picture. The picture shows up on a monitor. It is called  transvaginal because the probe is inserted into the vagina. There should be very little discomfort from the vaginal probe. This test can also be used during pregnancy. Endovaginal ultrasound is another name for a transvaginal ultrasound. In a transabdominal ultrasound, the probe is placed on the outside of the belly. This method gives pictures that are lower quality than pictures from the transvaginal technique. Transvaginal ultrasound is used to look for problems of the female genital tract. Some such problems include:  Infertility problems.  Congenital (birth defect) malformations of the uterus and ovaries.  Tumors in the uterus.  Abnormal bleeding.  Ovarian tumors and cysts.  Abscess (inflamed tissue around pus) in the pelvis.  Unexplained abdominal or pelvic pain.  Pelvic infection. DURING PREGNANCY, TRANSVAGINAL ULTRASOUND MAY BE USED TO LOOK AT:  Normal pregnancy.  Ectopic pregnancy (pregnancy outside the uterus).  Fetal heartbeat.  Abnormalities in the pelvis, that are not seen well with transabdominal ultrasound.  Suspected twins or multiples.  Impending miscarriage.  Problems with the cervix (incompetent cervix, not able to stay closed and hold the baby).  When doing an amniocentesis (removing fluid from the pregnancy sac, for testing).  Looking for abnormalities of the baby.  Checking the growth, development, and age of the fetus.  Measuring the amount of fluid in the amniotic sac.  When doing an external version of the baby (moving baby into correct position).  Evaluating the baby for problems in high risk pregnancies (biophysical profile).  Suspected fetal demise (death). Sometimes a special ultrasound method called Saline Infusion Sonography (SIS) is used for a more accurate look at the uterus. Sterile saline (salt water) is injected into the uterus of non-pregnant  patients to see the inside of the uterus better. SIS is not used on pregnant women. The  vaginal probe can also assist in obtaining biopsies of abnormal areas, in draining fluid from cysts on the ovary, and in finding IUDs (intrauterine device, birth control) that cannot be located. PREPARATION FOR TEST A transvaginal ultrasound is done with the bladder empty. The transabdominal ultrasound is done with your bladder full. You may be asked to drink several glasses of water before that exam. Sometimes, a transabdominal ultrasound is done just after a transvaginal ultrasound, to look at organs in your abdomen. PROCEDURE  You will lie down on a table, with your knees bent and your feet in foot holders. The probe is covered with a condom. A sterile lubricant is put into the vagina and on the probe. The lubricant helps transmit the sound waves and avoid irritating the vagina. Your caregiver will move the probe inside the vaginal cavity to scan the pelvic structures. A normal test will show a normal pelvis and normal contents. An abnormal test will show abnormalities of the pelvis, placenta, or baby. ABNORMAL RESULTS MAY BE DUE TO:  Growths or tumors in the:  Uterus.  Ovaries.  Vagina.  Other pelvic structures.  Non-cancerous growths of the uterus and ovaries.  Twisting of the ovary, cutting off blood supply to the ovary (ovarian torsion).  Areas of infection, including:  Pelvic inflammatory disease.  Abscess in the pelvis.  Locating an IUD. PROBLEMS FOUND IN PREGNANT WOMEN MAY INCLUDE:  Ectopic pregnancy (pregnancy outside the uterus).  Multiple pregnancies.  Early dilation (opening) of the cervix. This may indicate an incompetent cervix and early delivery.  Impending miscarriage.  Fetal death.  Problems with the placenta, including:  Placenta has grown over the opening of the womb (placenta previa).  Placenta has separated early in the womb (placental abruption).  Placenta grows into the muscle of the uterus (placenta accreta).  Tumors of pregnancy, including  gestational trophoblastic disease. This is an abnormal pregnancy, with no fetus. The uterus is filled with many grape-like cysts that could sometimes be cancerous.  Incorrect position of the fetus (breech, vertex).  Intrauterine fetal growth retardation (IUGR) (poor growth in the womb).  Fetal abnormalities or infection. RISKS AND COMPLICATIONS There are no known risks to the ultrasound procedure. There is no X-ray used when doing an ultrasound. Document Released: 12/05/2003 Document Revised: 03/17/2011 Document Reviewed: 11/22/2008 [facility] Patient Information 2014 [place], [place].

## 2012-08-23 ENCOUNTER — Other Ambulatory Visit: Payer: Self-pay | Admitting: Gynecology

## 2012-08-23 DIAGNOSIS — R102 Pelvic and perineal pain: Secondary | ICD-10-CM

## 2012-08-30 ENCOUNTER — Ambulatory Visit (INDEPENDENT_AMBULATORY_CARE_PROVIDER_SITE_OTHER): Payer: 59 | Admitting: Internal Medicine

## 2012-08-30 ENCOUNTER — Encounter: Payer: Self-pay | Admitting: Internal Medicine

## 2012-08-30 VITALS — BP 148/90 | HR 80 | Temp 99.5°F | Wt 179.5 lb

## 2012-08-30 DIAGNOSIS — R1012 Left upper quadrant pain: Secondary | ICD-10-CM

## 2012-08-30 DIAGNOSIS — R109 Unspecified abdominal pain: Secondary | ICD-10-CM

## 2012-08-30 LAB — LIPASE: Lipase: <10 U/L (ref 0–75)

## 2012-08-30 LAB — AMYLASE: Amylase: 97 U/L (ref 0–105)

## 2012-09-02 ENCOUNTER — Ambulatory Visit
Admission: RE | Admit: 2012-09-02 | Discharge: 2012-09-02 | Disposition: A | Discharge status: Home / Self Care | Payer: 59 | Source: Ambulatory Visit | Attending: Internal Medicine | Admitting: Internal Medicine

## 2012-09-02 MED ORDER — IOHEXOL 300 MG/ML  SOLN
100.0000 mL | Freq: Once | INTRAMUSCULAR | Status: AC | PRN
  Administered 2012-09-02: 100 mL via INTRAVENOUS

## 2012-09-03 NOTE — Progress Notes (Signed)
Patient informed. 

## 2012-09-03 NOTE — Progress Notes (Signed)
Patient informed. Will be having pelvic ultrasound on 09/22/2012. Advised ref to GI MD if abdominal pain is no better.

## 2012-09-04 NOTE — Patient Instructions (Addendum)
Amylase lipase and and antigliadin antibodies drawn. CT of the abdomen and pelvis to be arranged. If symptoms persist, we will arrange for you to see gastroenterologist.

## 2012-09-04 NOTE — Progress Notes (Signed)
  Subjective:    Patient ID: Jordan Gregory, female    DOB: 30-Oct-1955, 57 y.o.   MRN: [id number]  HPI 57 year old white female returns today with complaint of persistent abdominal pain. Complaining of left upper quadrant and epigastric pain almost daily. Has tried Zantac and Gas-X without relief. She has seen GYN physician. She had an endometrial biopsy which was negative. An ultrasound is planned for September. Patient complaining of decreased appetite. No constipation or diarrhea. She was seen in urgent care recently by Dr. Merla Riches on August 2.. Patient had normal rectal exam stool is guaiac negative on August 2. CBC, C. met, H. pylori antibody were all within normal limits.  Colonoscopy by Dr. Jarold Motto in 2012. No polyps identified. 10 year followup recommended.  History of hypertension, insomnia, vitamin D deficiency and hypothyroidism. History of palpitations previously evaluated by Dr. Eden Emms and thought to be benign. Had negative stress echocardiogram during his evaluation.  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 Is married. No children. Does not smoke. Social alcohol consumption. She exercises by walking an hour 5 times a week. Does some cycling.  Family history: Father with history of arrhythmia. Mother with history of arthritis. 2 brothers in good health.  Patient admits that she is worried because a friend has been diagnosed with ovarian cancer.    Review of Systems     Objective:   Physical Exam Bowel sounds are active. No hepatosplenomegaly. No masses appreciated. She has some mild tenderness in her left upper quadrant without rebound tenderness.        Assessment & Plan:  Persistent left abdominal pain-etiology unclear. Has had colonoscopy in 2012 which was normal. Recent lab work including CBC and see met with H. pylori antibody are all within normal limits. She's had recent GYN exam. Endometrial biopsy normal for postmenopausal leading.  Ultrasound of the ovaries pending in September per GYN. Today will proceed with amylase, lipase, antigliadin antibodies, sedimentation rate. She'll have CT of the abdomen and pelvis. Patient was reassured once again. If symptoms persist, she will need to see gastroenterologist. I think there is an element of anxiety because her friends been diagnosed with ovarian cancer.  Addendum: CT of abdomen and pelvis is negative. They noted a small 4 mm nodule in left lung base. She is a nonsmoker and therefore at low-risk for carcinoma.  25 minutes spent with patient

## 2012-09-13 ENCOUNTER — Ambulatory Visit (INDEPENDENT_AMBULATORY_CARE_PROVIDER_SITE_OTHER): Payer: 59 | Admitting: Gynecology

## 2012-09-13 ENCOUNTER — Ambulatory Visit (INDEPENDENT_AMBULATORY_CARE_PROVIDER_SITE_OTHER): Payer: 59

## 2012-09-13 DIAGNOSIS — D259 Leiomyoma of uterus, unspecified: Secondary | ICD-10-CM

## 2012-09-13 DIAGNOSIS — R102 Pelvic and perineal pain: Secondary | ICD-10-CM

## 2012-09-13 DIAGNOSIS — N949 Unspecified condition associated with female genital organs and menstrual cycle: Secondary | ICD-10-CM

## 2012-09-13 DIAGNOSIS — N950 Postmenopausal bleeding: Secondary | ICD-10-CM

## 2012-09-13 NOTE — Progress Notes (Signed)
The patient is a 57 year old who presented to the office today for continued evaluation of her isolated episode of postmenopausal bleeding (dark and small amount) and left lower quadrant discomfort.Patient states that she has been menopausal for several years. She has never suffered from major vasomotor symptoms and has never been on hormone replacement therapy. She denies any dyspareunia.   Menarche age 44  No children  No family history of GYN malignancy  No history of hormone replacement therapy  Oral contraceptive pills at a younger age  Normal Pap smear and mammogram reported January of this year.  Patient had an endometrial biopsy on the last office visit August 14 with the following results:  Diagnosis Endometrium, biopsy - ATROPHIC APPEARING ENDOMETRIUM. - THERE IS NO EVIDENCE OF HYPERPLASIA OR MALIGNANCY.  The patient underwent a sonohysterogram today with the following findings: Uterus measures 6.9 x 5.7 x 3.2 cm endometrial stripe of 3.2 mm. One small left pedunculated fibroid measuring 3.9 x 2.6 mm was noted and 2 other small ones largest one measuring 15 x 11 mm. Both ovaries appeared to be normal. After normal saline was instilled into the uterine cavity there was no evidence of any intrauterine defect noted.  Patient stated her primary physician ordered a CA 125 recently and the result was normal with a value of 5.0  Assessment/plan: Isolated postmenopausal uterine atrophic bleed. Normal endometrial biopsy, normal sonohysterogram, normal CA 125. The left lower quadrant twinges that patient's experience may be either attributed to the small pedunculated fibroid intermittently causing discomfort or IBS or diverticulosis. She will keep a menstrual calendar for the next 6 months and monitor her symptoms. She will touch base with her primary for possible colonoscopy. We did discuss if her symptoms continue to affect her quality of life we may proceed a later date with a total  laparoscopic hysterectomy instead of a laparoscopic myomectomy since she is 57 years of age.

## 2012-09-22 ENCOUNTER — Other Ambulatory Visit: Payer: 59

## 2012-09-22 ENCOUNTER — Ambulatory Visit: Payer: 59 | Admitting: Gynecology

## 2012-10-15 ENCOUNTER — Ambulatory Visit (INDEPENDENT_AMBULATORY_CARE_PROVIDER_SITE_OTHER): Payer: 59 | Admitting: Internal Medicine

## 2012-10-15 ENCOUNTER — Encounter: Payer: Self-pay | Admitting: Internal Medicine

## 2012-10-15 VITALS — BP 148/84 | HR 68 | Temp 98.8°F | Wt 182.0 lb

## 2012-10-15 DIAGNOSIS — K589 Irritable bowel syndrome without diarrhea: Secondary | ICD-10-CM

## 2012-10-15 DIAGNOSIS — Z23 Encounter for immunization: Secondary | ICD-10-CM

## 2012-10-15 DIAGNOSIS — I10 Essential (primary) hypertension: Secondary | ICD-10-CM

## 2012-10-15 MED ORDER — LOSARTAN POTASSIUM 100 MG PO TABS: 100.0000 mg | ORAL_TABLET | Freq: Every day | ORAL | Status: DC

## 2012-10-15 MED ORDER — HYOSCYAMINE SULFATE 0.125 MG SL SUBL: 0.1250 mg | SUBLINGUAL_TABLET | SUBLINGUAL | Status: DC | PRN

## 2012-10-15 MED ORDER — LEVOFLOXACIN 500 MG PO TABS: 500.0000 mg | ORAL_TABLET | Freq: Every day | ORAL | Status: DC

## 2012-10-15 NOTE — Progress Notes (Signed)
  Subjective:    Patient ID: Jordan Gregory, female    DOB: [birth year], 57 y.o.   MRN: [id number]  HPI  In today to followup on hypothyroidism and hypertension. She is not been checking blood pressure at home. It is elevated today. She has recently seen Dr. Lily Peer regarding abdominal pain. Was found to have uterine fibroids. She does feel gassy after a meal particularly in the afternoon. Has seen Dr. Jarold Motto in the past for colonoscopy. For going to try Levsin sublingual before lunch to see if that will help with flatus symptoms and abdominal bloating. Recent CT of the abdomen and pelvis were within normal limits. She her husband are going to [place] for a vacation in 2 weeks. Suggested she take hepatitis A series. Will take influenza vaccine today. Blood pressure need some adjustment. TSH drawn and is pending.    Review of Systems     Objective:   Physical Exam neck is supple without thyromegaly. Chest clear to auscultation. Cardiac exam regular rate and rhythm normal S1 and S2. Extremities without edema. Skin is warm and dry.        Assessment & Plan:  Traveled to [place]  Hypothyroidism-TSH is pending  Hypertension-increased losartan from 50-100 mg daily and have patient return in 4 weeks for office visit and basic metabolic panel  Abdominal bloating-? Irritable bowel syndrome  Hepatitis A vaccine ordered. She will only get one dose before leaving for [place]. Second dose due in 6 months. Try Levsin sublingual before lunch and reevaluate when she returns in 4 weeks  Time spent with patient including research for travel to [place] 25 minutes

## 2012-10-15 NOTE — Patient Instructions (Signed)
Hepatitis A vaccine will be ordered. Increased losartan 100 mg daily and return in 4 weeks. TSH drawn today and is pending. Flu vaccine given

## 2012-10-16 LAB — TSH: TSH: 1.964 u[IU]/mL (ref 0.350–4.500)

## 2012-10-17 ENCOUNTER — Other Ambulatory Visit: Payer: Self-pay | Admitting: Internal Medicine

## 2012-10-17 NOTE — Telephone Encounter (Signed)
Refill x 6 months 

## 2012-10-19 ENCOUNTER — Ambulatory Visit (INDEPENDENT_AMBULATORY_CARE_PROVIDER_SITE_OTHER): Payer: 59 | Admitting: Internal Medicine

## 2012-10-19 DIAGNOSIS — Z23 Encounter for immunization: Secondary | ICD-10-CM

## 2012-10-19 MED ORDER — HEPATITIS A VACCINE 1440 EL U/ML IM SUSP: 1.0000 mL | Freq: Once | INTRAMUSCULAR | Status: DC

## 2012-10-20 ENCOUNTER — Other Ambulatory Visit: Payer: Self-pay | Admitting: Internal Medicine

## 2012-11-26 ENCOUNTER — Ambulatory Visit (INDEPENDENT_AMBULATORY_CARE_PROVIDER_SITE_OTHER): Payer: 59 | Admitting: Internal Medicine

## 2012-11-26 ENCOUNTER — Encounter: Payer: Self-pay | Admitting: Internal Medicine

## 2012-11-26 VITALS — BP 130/88 | HR 64 | Temp 98.2°F | Ht 67.5 in | Wt 185.0 lb

## 2012-11-26 DIAGNOSIS — Z131 Encounter for screening for diabetes mellitus: Secondary | ICD-10-CM

## 2012-11-26 DIAGNOSIS — I10 Essential (primary) hypertension: Secondary | ICD-10-CM

## 2012-11-26 DIAGNOSIS — R109 Unspecified abdominal pain: Secondary | ICD-10-CM

## 2012-11-26 LAB — BASIC METABOLIC PANEL
BUN: 21 mg/dL (ref 6–23)
Calcium: 9 mg/dL (ref 8.4–10.5)
Creat: 0.95 mg/dL (ref 0.50–1.10)
Glucose, Bld: 80 mg/dL (ref 70–99)
Potassium: 4.7 mEq/L (ref 3.5–5.3)

## 2012-11-26 LAB — HEMOGLOBIN A1C
Hgb A1c MFr Bld: 5.5 % (ref <5.7)
Mean Plasma Glucose: 111 mg/dL (ref <117)

## 2013-03-03 ENCOUNTER — Encounter: Payer: Self-pay | Admitting: Internal Medicine

## 2013-03-03 NOTE — Patient Instructions (Addendum)
Continue same antihypertensive medication- losartan 100 mg daily and return in 6 months. Physical exam due after 03/18/2013. Second hepatitis A vaccine due at same time

## 2013-03-03 NOTE — Progress Notes (Signed)
   Subjective:    Patient ID: Jordan Gregory, female    DOB: 08/28/1955, 58 y.o.   MRN: [id number]  HPI She returned from trip to [place] recently. She did well during the trip and had a nice time. She had first hepatitis A immunization October 14 and is due for second immunization mid-March 2015. She had been having some vague abdominal pain. She was worried about cancer. She saw GYN physician for exam and was found to have uterine fibroids. She  also  had CT abdomen and pelvis ordered by myself which proved to be negative. I prescribed Levsin for her. At last visit we increased losartan from 50-100 mg daily for hypertension. She feels well on this dose. Blood pressure is slightly elevated today diastolically. However says blood pressures been fine at home. She seems much less stressed.    Review of Systems     Objective:   Physical Exam Neck is supple without JVD thyromegaly or carotid bruits. Chest clear. Cardiac exam regular rate and rhythm normal S1 and S2. Abdomen no hepatosplenomegaly masses or significant tenderness        Assessment & Plan:  Abdominal pain-improved  Hypertension-stable on losartan 100 mg daily  Uterine fibroids-followed by Dr. Toney Rakes  Plan: Return in 6 months or as needed. Hepatitis A #2   is due mid-March 2015

## 2013-04-10 ENCOUNTER — Other Ambulatory Visit: Payer: Self-pay | Admitting: Internal Medicine

## 2013-04-24 ENCOUNTER — Other Ambulatory Visit: Payer: Self-pay | Admitting: Internal Medicine

## 2013-04-25 NOTE — Telephone Encounter (Signed)
Notes say pt due for Hep A #2 and TSH with OV and BP check this month.

## 2013-05-24 ENCOUNTER — Other Ambulatory Visit: Payer: 59 | Admitting: Internal Medicine

## 2013-05-24 DIAGNOSIS — Z1329 Encounter for screening for other suspected endocrine disorder: Secondary | ICD-10-CM

## 2013-05-24 DIAGNOSIS — E039 Hypothyroidism, unspecified: Secondary | ICD-10-CM

## 2013-05-24 DIAGNOSIS — Z130 Encounter for screening for diseases of the blood and blood-forming organs and certain disorders involving the immune mechanism: Secondary | ICD-10-CM

## 2013-05-24 DIAGNOSIS — I10 Essential (primary) hypertension: Secondary | ICD-10-CM

## 2013-05-24 DIAGNOSIS — Z13228 Encounter for screening for other metabolic disorders: Secondary | ICD-10-CM

## 2013-05-24 DIAGNOSIS — Z13220 Encounter for screening for lipoid disorders: Secondary | ICD-10-CM

## 2013-05-24 LAB — LIPID PANEL
CHOL/HDL RATIO: 2.6 ratio
CHOLESTEROL: 190 mg/dL (ref 0–200)
HDL: 74 mg/dL (ref >39)
LDL Cholesterol: 107 mg/dL — ABNORMAL HIGH (ref 0–99)
Triglycerides: 47 mg/dL (ref <150)
VLDL: 9 mg/dL (ref 0–40)

## 2013-05-24 LAB — CBC WITH DIFFERENTIAL/PLATELET
Basophils Absolute: 0 10*3/uL (ref 0.0–0.1)
Basophils Relative: 1 % (ref 0–1)
EOS ABS: 0.2 10*3/uL (ref 0.0–0.7)
EOS PCT: 5 % (ref 0–5)
HEMATOCRIT: 39.2 % (ref 36.0–46.0)
HEMOGLOBIN: 13.5 g/dL (ref 12.0–15.0)
LYMPHS ABS: 1.2 10*3/uL (ref 0.7–4.0)
Lymphocytes Relative: 33 % (ref 12–46)
MCH: 29.4 pg (ref 26.0–34.0)
MCHC: 34.4 g/dL (ref 30.0–36.0)
MCV: 85.4 fL (ref 78.0–100.0)
MONO ABS: 0.3 10*3/uL (ref 0.1–1.0)
MONOS PCT: 8 % (ref 3–12)
Neutro Abs: 1.9 10*3/uL (ref 1.7–7.7)
Neutrophils Relative %: 53 % (ref 43–77)
Platelets: 241 10*3/uL (ref 150–400)
RBC: 4.59 MIL/uL (ref 3.87–5.11)
RDW: 14.3 % (ref 11.5–15.5)
WBC: 3.6 10*3/uL — AB (ref 4.0–10.5)

## 2013-05-24 LAB — COMPREHENSIVE METABOLIC PANEL
ALT: 11 U/L (ref 0–35)
AST: 14 U/L (ref 0–37)
Albumin: 4.5 g/dL (ref 3.5–5.2)
Alkaline Phosphatase: 51 U/L (ref 39–117)
BUN: 14 mg/dL (ref 6–23)
CALCIUM: 9.2 mg/dL (ref 8.4–10.5)
CO2: 28 meq/L (ref 19–32)
CREATININE: 0.84 mg/dL (ref 0.50–1.10)
Chloride: 105 mEq/L (ref 96–112)
GLUCOSE: 97 mg/dL (ref 70–99)
Potassium: 4 mEq/L (ref 3.5–5.3)
Sodium: 140 mEq/L (ref 135–145)
TOTAL PROTEIN: 6.9 g/dL (ref 6.0–8.3)
Total Bilirubin: 0.6 mg/dL (ref 0.2–1.2)

## 2013-05-25 ENCOUNTER — Other Ambulatory Visit: Payer: Self-pay | Admitting: Internal Medicine

## 2013-05-25 LAB — VITAMIN D 25 HYDROXY (VIT D DEFICIENCY, FRACTURES): Vit D, 25-Hydroxy: 64 ng/mL (ref 30–89)

## 2013-05-25 LAB — TSH: TSH: 2.105 u[IU]/mL (ref 0.350–4.500)

## 2013-05-26 ENCOUNTER — Ambulatory Visit (INDEPENDENT_AMBULATORY_CARE_PROVIDER_SITE_OTHER): Payer: 59 | Admitting: Internal Medicine

## 2013-05-26 VITALS — BP 144/86 | HR 80 | Temp 98.6°F | Ht 68.25 in | Wt 186.0 lb

## 2013-05-26 DIAGNOSIS — Z23 Encounter for immunization: Secondary | ICD-10-CM

## 2013-05-26 DIAGNOSIS — I10 Essential (primary) hypertension: Secondary | ICD-10-CM

## 2013-05-26 DIAGNOSIS — G4700 Insomnia, unspecified: Secondary | ICD-10-CM

## 2013-05-26 DIAGNOSIS — E039 Hypothyroidism, unspecified: Secondary | ICD-10-CM

## 2013-05-26 DIAGNOSIS — Z0000 Encounter for general adult medical examination without abnormal findings: Secondary | ICD-10-CM

## 2013-05-26 LAB — POCT URINALYSIS DIPSTICK
BILIRUBIN UA: NEGATIVE
GLUCOSE UA: NEGATIVE
KETONES UA: NEGATIVE
Leukocytes, UA: NEGATIVE
Nitrite, UA: NEGATIVE
Protein, UA: NEGATIVE
SPEC GRAV UA: 1.02
Urobilinogen, UA: NEGATIVE
pH, UA: 6

## 2013-05-26 MED ORDER — HEPATITIS A VACCINE 1440 EL U/ML IM SUSP: 1.0000 mL | Freq: Once | INTRAMUSCULAR | Status: DC

## 2013-05-26 NOTE — Telephone Encounter (Signed)
Print refill before OV today

## 2013-07-14 ENCOUNTER — Encounter: Payer: Self-pay | Admitting: Internal Medicine

## 2013-07-14 ENCOUNTER — Ambulatory Visit (INDEPENDENT_AMBULATORY_CARE_PROVIDER_SITE_OTHER): Payer: 59 | Admitting: Internal Medicine

## 2013-07-14 VITALS — BP 144/88 | HR 76 | Temp 99.1°F | Wt 182.0 lb

## 2013-07-14 DIAGNOSIS — J302 Other seasonal allergic rhinitis: Secondary | ICD-10-CM

## 2013-07-14 DIAGNOSIS — J3089 Other allergic rhinitis: Secondary | ICD-10-CM

## 2013-07-14 MED ORDER — METHYLPREDNISOLONE ACETATE 80 MG/ML IJ SUSP
80.0000 mg | Freq: Once | INTRAMUSCULAR | Status: AC
  Administered 2013-07-14: 80 mg via INTRAMUSCULAR

## 2013-09-12 ENCOUNTER — Encounter: Payer: Self-pay | Admitting: Internal Medicine

## 2013-09-12 NOTE — Progress Notes (Signed)
   Subjective:    Patient ID: Jordan Gregory, female    DOB: [birth year], 58 y.o.   MRN: [id number]  HPI  57 year old Female in today for health maintenance exam and evaluation of medical issues. She has a history of hypertension and hypothyroidism. Hypertension was diagnosed in 2012. She also has history of insomnia and vitamin D deficiency.   Patient is unable to tolerate Ramapril  because of cough. History of palpitations evaluated by Dr. Johnsie Cancel in the past and thought to be benign. Had negative stress echocardiogram with his evaluation. Colonoscopy done by Dr. Sharlett Iles and 2012. No polyps identified. 10 year followup recommended. No known drug allergies  History of fractured wrist, fractured clavicle and fractured leg: Childhood.  History of right shoulder pain evaluated by Dr. Daylene Katayama  in 2009 showing type II acromion with impingement. Physical therapy recommended.  Social history: She has a Scientist, water quality in art history. Works at [employer]. She is married. No children. Does not smoke. Social alcohol consumption.  Family history: Father with history of arrhythmia. Mother with history of arthritis. 2 brothers in good health.    Review of Systems  Constitutional: Negative.   All other systems reviewed and are negative.      Objective:   Physical Exam  Vitals reviewed. Constitutional: She is oriented to person, place, and time. She appears well-developed and well-nourished. No distress.  HENT:  Head: Normocephalic and atraumatic.  Right Ear: External ear normal.  Left Ear: External ear normal.  Mouth/Throat: Oropharynx is clear and moist. No oropharyngeal exudate.  Eyes: Conjunctivae and EOM are normal. Pupils are equal, round, and reactive to light. Right eye exhibits no discharge. No scleral icterus.  Neck: Neck supple. No JVD present. No thyromegaly present.  Cardiovascular: Normal rate, regular rhythm, normal heart sounds and intact distal pulses.   No  murmur heard. Pulmonary/Chest: Effort normal and breath sounds normal. No respiratory distress. She has no rales. She exhibits no tenderness.  Breasts normal female  Abdominal: Soft. Bowel sounds are normal. She exhibits no distension and no mass. There is no tenderness. There is no rebound and no guarding.  Genitourinary:  Pap done 2014. Bimanual is normal.  Musculoskeletal: Normal range of motion. She exhibits no edema.  Lymphadenopathy:    She has no cervical adenopathy.  Neurological: She is alert and oriented to person, place, and time. She has normal reflexes. No cranial nerve deficit. Coordination normal.  Skin: Skin is warm and dry. No rash noted. She is not diaphoretic.  Psychiatric: She has a normal mood and affect. Her behavior is normal. Judgment and thought content normal.          Assessment & Plan:  Hypertension-stable  Hypothyroidism-stable on thyroid replacement.  History of insomnia  History of type II acromion impingement 2009  History of vitamin D deficiency  History of palpitations  Plan: Return in 6 months for office visit and TSH.

## 2013-09-12 NOTE — Patient Instructions (Signed)
Continue Cozaar 100 mg daily. Return in 6 months for office visit and TSH.

## 2013-09-17 NOTE — Patient Instructions (Signed)
Call if symptoms have not improved within the next 2 weeks. Depo-Medrol IM given today

## 2013-09-17 NOTE — Progress Notes (Signed)
   Subjective:    Patient ID: Jordan Gregory, female    DOB: August 19, 1955, 58 y.o.   MRN: [id number]  HPI Was here in May for physical examination. Complaining of some dizziness. Thinks she may have an ear infection. Dizziness is noted with position change.     Review of Systems     Objective:   Physical Exam  TMs are slightly full but not red. Pharynx is clear. Boggy nasal mucosa. PERRLA. Slight nystagmus. Cranial nerves II through XII are intact. Muscle strength is normal. No focal deficits on brief neurological exam.      Assessment & Plan:  Benign positional vertigo  Serous otitis media bilateral  Plan: Depo-Medrol 80 mg IM.

## 2013-10-13 ENCOUNTER — Other Ambulatory Visit: Payer: Self-pay | Admitting: Internal Medicine

## 2013-11-03 ENCOUNTER — Other Ambulatory Visit: Payer: Self-pay | Admitting: Internal Medicine

## 2013-11-28 ENCOUNTER — Encounter: Payer: Self-pay | Admitting: Internal Medicine

## 2013-11-28 ENCOUNTER — Ambulatory Visit (INDEPENDENT_AMBULATORY_CARE_PROVIDER_SITE_OTHER): Payer: 59 | Admitting: Internal Medicine

## 2013-11-28 VITALS — BP 134/82 | HR 79 | Temp 98.8°F | Wt 183.0 lb

## 2013-11-28 DIAGNOSIS — I10 Essential (primary) hypertension: Secondary | ICD-10-CM

## 2013-11-28 DIAGNOSIS — G4700 Insomnia, unspecified: Secondary | ICD-10-CM

## 2013-11-28 DIAGNOSIS — E039 Hypothyroidism, unspecified: Secondary | ICD-10-CM

## 2013-11-28 NOTE — Patient Instructions (Addendum)
Continue same medications and return in 6 months. TSH is pending.

## 2013-11-29 ENCOUNTER — Telehealth: Payer: Self-pay

## 2013-11-29 LAB — TSH: TSH: 2.469 u[IU]/mL (ref 0.350–4.500)

## 2013-11-29 NOTE — Telephone Encounter (Signed)
Left message informing patient tsh was normal and no change in medication.

## 2013-11-29 NOTE — Telephone Encounter (Signed)
-----   Message from Elby Showers, MD sent at 11/29/2013 10:23 AM EST ----- TSH is normal. Continue same dose of Synthroid

## 2014-01-03 ENCOUNTER — Other Ambulatory Visit: Payer: Self-pay | Admitting: Internal Medicine

## 2014-01-04 ENCOUNTER — Other Ambulatory Visit: Payer: Self-pay | Admitting: *Deleted

## 2014-01-04 MED ORDER — ZOLPIDEM TARTRATE 10 MG PO TABS: 10.0000 mg | ORAL_TABLET | Freq: Every evening | ORAL | Status: DC | PRN

## 2014-01-04 NOTE — Telephone Encounter (Signed)
Refill x 6 months 

## 2014-01-04 NOTE — Telephone Encounter (Signed)
Script called in to pharmacy for Ambien

## 2014-01-06 HISTORY — PX: MOHS SURGERY: SUR867

## 2014-03-02 ENCOUNTER — Encounter: Payer: Self-pay | Admitting: Internal Medicine

## 2014-03-02 NOTE — Progress Notes (Signed)
   Subjective:    Patient ID: Jordan Gregory, female    DOB: 13-Dec-1955, 59 y.o.   MRN: [id number]  HPI 59 year old White Female in today for six-month recheck on hypertension and hypothyroidism. No new complaints or problems. Feels well. Continues to work at [employer]. History of insomnia for which she takes Ambien. She is on low-dose thyroid replacement therapy and losartan.    Review of Systems     Objective:   Physical Exam  Skin warm and dry. Nodes none. No thyromegaly. Chest clear. Cardiac exam regular rate and rhythm. Extremities without edema.      Assessment & Plan:  Essential hypertension-stable on losartan  Hypothyroidism-TSH is normal on Synthroid 0.05 mg daily  Insomnia  Plan: Return in 6 months for physical examination.

## 2014-03-05 ENCOUNTER — Other Ambulatory Visit: Payer: Self-pay | Admitting: Internal Medicine

## 2014-04-12 ENCOUNTER — Other Ambulatory Visit: Payer: Self-pay | Admitting: Internal Medicine

## 2014-05-15 ENCOUNTER — Other Ambulatory Visit: Payer: Self-pay | Admitting: Internal Medicine

## 2014-05-29 ENCOUNTER — Other Ambulatory Visit: Payer: 59 | Admitting: Internal Medicine

## 2014-05-29 ENCOUNTER — Other Ambulatory Visit: Payer: Self-pay | Admitting: Internal Medicine

## 2014-05-29 DIAGNOSIS — Z1329 Encounter for screening for other suspected endocrine disorder: Secondary | ICD-10-CM

## 2014-05-29 DIAGNOSIS — Z1321 Encounter for screening for nutritional disorder: Secondary | ICD-10-CM

## 2014-05-29 DIAGNOSIS — Z0000 Encounter for general adult medical examination without abnormal findings: Secondary | ICD-10-CM

## 2014-05-29 DIAGNOSIS — Z13220 Encounter for screening for lipoid disorders: Secondary | ICD-10-CM

## 2014-05-29 DIAGNOSIS — Z130 Encounter for screening for diseases of the blood and blood-forming organs and certain disorders involving the immune mechanism: Secondary | ICD-10-CM

## 2014-05-29 LAB — CBC WITH DIFFERENTIAL/PLATELET
BASOS PCT: 1 % (ref 0–1)
Basophils Absolute: 0 10*3/uL (ref 0.0–0.1)
EOS ABS: 0.2 10*3/uL (ref 0.0–0.7)
Eosinophils Relative: 5 % (ref 0–5)
HCT: 40.6 % (ref 36.0–46.0)
HEMOGLOBIN: 13.7 g/dL (ref 12.0–15.0)
LYMPHS ABS: 1.2 10*3/uL (ref 0.7–4.0)
Lymphocytes Relative: 30 % (ref 12–46)
MCH: 29.3 pg (ref 26.0–34.0)
MCHC: 33.7 g/dL (ref 30.0–36.0)
MCV: 86.9 fL (ref 78.0–100.0)
MONO ABS: 0.3 10*3/uL (ref 0.1–1.0)
MONOS PCT: 8 % (ref 3–12)
MPV: 9.9 fL (ref 8.6–12.4)
NEUTROS ABS: 2.3 10*3/uL (ref 1.7–7.7)
Neutrophils Relative %: 56 % (ref 43–77)
Platelets: 207 10*3/uL (ref 150–400)
RBC: 4.67 MIL/uL (ref 3.87–5.11)
RDW: 14.3 % (ref 11.5–15.5)
WBC: 4.1 10*3/uL (ref 4.0–10.5)

## 2014-05-29 LAB — LIPID PANEL
CHOL/HDL RATIO: 2.6 ratio
CHOLESTEROL: 183 mg/dL (ref 0–200)
HDL: 71 mg/dL (ref >=46)
LDL Cholesterol: 100 mg/dL — ABNORMAL HIGH (ref 0–99)
TRIGLYCERIDES: 58 mg/dL (ref <150)
VLDL: 12 mg/dL (ref 0–40)

## 2014-05-29 LAB — COMPLETE METABOLIC PANEL WITH GFR
ALT: 14 U/L (ref 0–35)
AST: 15 U/L (ref 0–37)
Albumin: 4.3 g/dL (ref 3.5–5.2)
Alkaline Phosphatase: 52 U/L (ref 39–117)
BILIRUBIN TOTAL: 0.5 mg/dL (ref 0.2–1.2)
BUN: 16 mg/dL (ref 6–23)
CALCIUM: 9.2 mg/dL (ref 8.4–10.5)
CO2: 27 mEq/L (ref 19–32)
Chloride: 105 mEq/L (ref 96–112)
Creat: 0.78 mg/dL (ref 0.50–1.10)
GFR, EST NON AFRICAN AMERICAN: 83 mL/min
GFR, Est African American: >89 mL/min
Glucose, Bld: 104 mg/dL — ABNORMAL HIGH (ref 70–99)
POTASSIUM: 4.5 meq/L (ref 3.5–5.3)
Sodium: 141 mEq/L (ref 135–145)
Total Protein: 6.5 g/dL (ref 6.0–8.3)

## 2014-05-29 LAB — TSH: TSH: 1.871 u[IU]/mL (ref 0.350–4.500)

## 2014-05-30 ENCOUNTER — Encounter: Payer: Self-pay | Admitting: Internal Medicine

## 2014-05-30 ENCOUNTER — Ambulatory Visit (INDEPENDENT_AMBULATORY_CARE_PROVIDER_SITE_OTHER): Payer: 59 | Admitting: Internal Medicine

## 2014-05-30 VITALS — BP 130/84 | HR 84 | Temp 98.6°F | Ht 68.0 in | Wt 180.0 lb

## 2014-05-30 DIAGNOSIS — G4700 Insomnia, unspecified: Secondary | ICD-10-CM | POA: Diagnosis not present

## 2014-05-30 DIAGNOSIS — Z8639 Personal history of other endocrine, nutritional and metabolic disease: Secondary | ICD-10-CM

## 2014-05-30 DIAGNOSIS — E669 Obesity, unspecified: Secondary | ICD-10-CM

## 2014-05-30 DIAGNOSIS — F411 Generalized anxiety disorder: Secondary | ICD-10-CM

## 2014-05-30 DIAGNOSIS — E039 Hypothyroidism, unspecified: Secondary | ICD-10-CM

## 2014-05-30 DIAGNOSIS — Z0000 Encounter for general adult medical examination without abnormal findings: Secondary | ICD-10-CM

## 2014-05-30 DIAGNOSIS — Z8679 Personal history of other diseases of the circulatory system: Secondary | ICD-10-CM | POA: Diagnosis not present

## 2014-05-30 DIAGNOSIS — I10 Essential (primary) hypertension: Secondary | ICD-10-CM

## 2014-05-30 DIAGNOSIS — Z87898 Personal history of other specified conditions: Secondary | ICD-10-CM

## 2014-05-30 LAB — POCT URINALYSIS DIPSTICK
Bilirubin, UA: NEGATIVE
Blood, UA: NEGATIVE
GLUCOSE UA: NEGATIVE
KETONES UA: NEGATIVE
LEUKOCYTES UA: NEGATIVE
Nitrite, UA: NEGATIVE
Protein, UA: NEGATIVE
Spec Grav, UA: 1.01
Urobilinogen, UA: NEGATIVE
pH, UA: 7.5

## 2014-05-30 LAB — HEMOGLOBIN A1C
HEMOGLOBIN A1C: 5.7 % — AB (ref <5.7)
MEAN PLASMA GLUCOSE: 117 mg/dL — AB (ref <117)

## 2014-05-30 LAB — VITAMIN D 25 HYDROXY (VIT D DEFICIENCY, FRACTURES): Vit D, 25-Hydroxy: 49 ng/mL (ref 30–100)

## 2014-05-30 MED ORDER — ALPRAZOLAM 0.25 MG PO TABS: 0.2500 mg | ORAL_TABLET | Freq: Two times a day (BID) | ORAL | Status: DC | PRN

## 2014-05-30 NOTE — Patient Instructions (Signed)
Trial of Xanax as needed for anxiety. Return in 6 months for office visit TSH and hemoglobin A1c. Refer to dietitian regarding elevated serum glucose and hemoglobin A1c of 5.7%.

## 2014-05-30 NOTE — Progress Notes (Signed)
   Subjective:    Patient ID: Jordan Gregory, female    DOB: 1955-11-25, 59 y.o.   MRN: [id number]  HPI  59 year old white female in today for health maintenance exam. She has a history of hypertension and hypothyroidism. Hypertension was diagnosed in 2012. She also has a history of insomnia and vitamin D deficiency. Has been unable to tolerate Ramapril because of cough. History of palpitations evaluated by Dr. Johnsie Cancel in the past and thought to be benign. Had negative stress echocardiogram with cardiology evaluation.  Colonoscopy done by Dr. Sharlett Iles in 2012 with no polyps identified. 10 year follow-up recommended.  No known drug allergies  History of fractured wrist, fractured clavicle and fractured leg in childhood  History of right shoulder pain evaluated by Dr. Daylene Katayama in 2009 showing type II acromion with impingement. Physical therapy was recommended  Social history: She has Masters degree in art history. Works at [employer]. She is married. No children. Does not smoke. Social alcohol consumption.  Family history: Father with history of arrhythmia. Mother with history of arthritis. 2 brothers in good health.    Review of Systems  Constitutional: Negative.   All other systems reviewed and are negative.      Objective:   Physical Exam  Constitutional: She is oriented to person, place, and time. She appears well-developed and well-nourished. No distress.  HENT:  Head: Normocephalic and atraumatic.  Right Ear: External ear normal.  Mouth/Throat: Oropharynx is clear and moist. No oropharyngeal exudate.  Eyes: Conjunctivae are normal. Pupils are equal, round, and reactive to light. Right eye exhibits no discharge. No scleral icterus.  Neck: Neck supple. No JVD present. No thyromegaly present.  Cardiovascular: Normal rate, regular rhythm, normal heart sounds and intact distal pulses.   No murmur heard. Pulmonary/Chest: Effort normal and breath sounds normal. No  respiratory distress. She has no wheezes. She has no rales.  Breasts normal female without masses  Abdominal: Soft. Bowel sounds are normal. She exhibits no distension and no mass. There is no tenderness. There is no rebound and no guarding.  Genitourinary:  Pap done in 2014. Bimanual normal.  Musculoskeletal: Normal range of motion. She exhibits no edema.  Lymphadenopathy:    She has no cervical adenopathy.  Neurological: She is alert and oriented to person, place, and time. She has normal reflexes. No cranial nerve deficit. Coordination normal.  Skin: Skin is warm and dry. No rash noted. She is not diaphoretic.  Psychiatric: She has a normal mood and affect. Her behavior is normal. Judgment and thought content normal.  Vitals reviewed.         Assessment & Plan:  Obesity-encouraged diet and exercise  Elevated serum glucose-add hemoglobin A1c  Hypertension-stable  Hypothyroidism-stable on thyroid replacement  History of insomnia  History of vitamin D deficiency  History of palpitations  Plan: Return in 6 months for office visit, TSH and hemoglobin A1c. Refer to dietitian. Encouraged diet exercise and weight loss.  Addendum: Hemoglobin A1c 5.7%-recommend dietary consultation and reevaluate in 6 months

## 2014-05-31 ENCOUNTER — Telehealth: Payer: Self-pay | Admitting: *Deleted

## 2014-05-31 NOTE — Telephone Encounter (Signed)
Reviewed lab results and instructions with patient

## 2014-06-14 ENCOUNTER — Encounter: Payer: Self-pay | Admitting: Internal Medicine

## 2014-07-09 ENCOUNTER — Other Ambulatory Visit: Payer: Self-pay | Admitting: Internal Medicine

## 2014-07-29 ENCOUNTER — Other Ambulatory Visit: Payer: Self-pay | Admitting: Internal Medicine

## 2014-07-31 NOTE — Telephone Encounter (Signed)
Refill x 3 months 

## 2014-08-13 ENCOUNTER — Other Ambulatory Visit: Payer: Self-pay | Admitting: Internal Medicine

## 2014-11-08 ENCOUNTER — Other Ambulatory Visit: Payer: Self-pay | Admitting: Internal Medicine

## 2014-11-11 ENCOUNTER — Other Ambulatory Visit: Payer: Self-pay | Admitting: Internal Medicine

## 2014-12-05 ENCOUNTER — Other Ambulatory Visit: Payer: Commercial Managed Care - HMO | Admitting: Internal Medicine

## 2014-12-05 DIAGNOSIS — R739 Hyperglycemia, unspecified: Secondary | ICD-10-CM

## 2014-12-05 DIAGNOSIS — E039 Hypothyroidism, unspecified: Secondary | ICD-10-CM

## 2014-12-05 LAB — TSH: TSH: 1.852 u[IU]/mL (ref 0.350–4.500)

## 2014-12-05 LAB — HEMOGLOBIN A1C
Hgb A1c MFr Bld: 5.5 % (ref <5.7)
MEAN PLASMA GLUCOSE: 111 mg/dL (ref <117)

## 2014-12-05 NOTE — Addendum Note (Signed)
Addended by: Beryle Quant on: 12/05/2014 12:31 PM   Modules accepted: Orders

## 2014-12-07 ENCOUNTER — Ambulatory Visit (INDEPENDENT_AMBULATORY_CARE_PROVIDER_SITE_OTHER): Payer: Commercial Managed Care - HMO | Admitting: Internal Medicine

## 2014-12-07 ENCOUNTER — Encounter: Payer: Self-pay | Admitting: Internal Medicine

## 2014-12-07 VITALS — BP 130/80 | HR 78 | Temp 97.7°F | Resp 20 | Ht 68.0 in | Wt 174.0 lb

## 2014-12-07 DIAGNOSIS — G4700 Insomnia, unspecified: Secondary | ICD-10-CM

## 2014-12-07 DIAGNOSIS — E039 Hypothyroidism, unspecified: Secondary | ICD-10-CM

## 2014-12-07 DIAGNOSIS — I10 Essential (primary) hypertension: Secondary | ICD-10-CM | POA: Diagnosis not present

## 2014-12-07 DIAGNOSIS — M7918 Myalgia, other site: Secondary | ICD-10-CM

## 2014-12-07 DIAGNOSIS — E559 Vitamin D deficiency, unspecified: Secondary | ICD-10-CM | POA: Diagnosis not present

## 2014-12-07 DIAGNOSIS — M791 Myalgia: Secondary | ICD-10-CM

## 2014-12-07 DIAGNOSIS — F411 Generalized anxiety disorder: Secondary | ICD-10-CM

## 2014-12-07 DIAGNOSIS — R7302 Impaired glucose tolerance (oral): Secondary | ICD-10-CM | POA: Diagnosis not present

## 2014-12-07 NOTE — Patient Instructions (Signed)
Continue diet and exercise efforts. Continue same medications. Return in 6 months for physical exam.

## 2014-12-25 ENCOUNTER — Other Ambulatory Visit: Payer: Self-pay | Admitting: Internal Medicine

## 2015-01-03 ENCOUNTER — Other Ambulatory Visit: Payer: Self-pay | Admitting: Internal Medicine

## 2015-01-03 NOTE — Telephone Encounter (Signed)
Refill x 3 months 

## 2015-01-03 NOTE — Telephone Encounter (Signed)
Phoned to pharmacy 

## 2015-02-06 DIAGNOSIS — R7302 Impaired glucose tolerance (oral): Secondary | ICD-10-CM | POA: Insufficient documentation

## 2015-02-06 NOTE — Progress Notes (Signed)
   Subjective:    Patient ID: Jordan Gregory, female    DOB: 03-May-1955, 60 y.o.   MRN: [id number]  HPI history of essential hypertension, insomnia, hypothyroidism. Blood pressures under good control on current regimen consisting of losartan. Takes Ambien occasionally to sleep. Is on low dose Synthroid for hypothyroidism. Take exam axis needed for anxiety. She is lost 6 pounds since May 2016. Anxiety is under good control. She has a history of palpitations but that's not been an issue lately. His been evaluated by Dr. Roosvelt Harps and thought to be benign. Had negative stress echocardiogram with cardiology evaluation. History obesity. History of vitamin D deficiency.    Review of Systems no new complaints     Objective:   Physical Exam Skin warm and dry. Nodes none. No thyromegaly. No JVD or carotid bruits. Chest clear to auscultation. Cardiac exam regular rate and rhythm. Normal S1 and S2. Extremity is without edema. Affect is normal.       Assessment & Plan:  Essential hypertension-stable  Hypothyroidism-stable  Insomnia-treated with Ambien area of anxiety treated with when necessary Xanax  History of vitamin D deficiency  History of palpitations  History of elevated serum glucose  Obesity  Plan: Hemoglobin A1c was 5.7% in May. Encourage diet exercise and weight loss. Return in 6 months for physical examination.

## 2015-05-09 ENCOUNTER — Other Ambulatory Visit: Payer: Self-pay | Admitting: Internal Medicine

## 2015-05-29 ENCOUNTER — Other Ambulatory Visit: Payer: Commercial Managed Care - HMO | Admitting: Internal Medicine

## 2015-05-29 DIAGNOSIS — E559 Vitamin D deficiency, unspecified: Secondary | ICD-10-CM

## 2015-05-29 DIAGNOSIS — Z130 Encounter for screening for diseases of the blood and blood-forming organs and certain disorders involving the immune mechanism: Secondary | ICD-10-CM

## 2015-05-29 DIAGNOSIS — R7302 Impaired glucose tolerance (oral): Secondary | ICD-10-CM

## 2015-05-29 DIAGNOSIS — Z0000 Encounter for general adult medical examination without abnormal findings: Secondary | ICD-10-CM

## 2015-05-29 DIAGNOSIS — Z13220 Encounter for screening for lipoid disorders: Secondary | ICD-10-CM

## 2015-05-29 DIAGNOSIS — E039 Hypothyroidism, unspecified: Secondary | ICD-10-CM

## 2015-05-29 DIAGNOSIS — I10 Essential (primary) hypertension: Secondary | ICD-10-CM

## 2015-05-29 LAB — CBC WITH DIFFERENTIAL/PLATELET
Basophils Absolute: 36 cells/uL (ref 0–200)
Basophils Relative: 1 %
Eosinophils Absolute: 216 cells/uL (ref 15–500)
Eosinophils Relative: 6 %
HCT: 41.6 % (ref 35.0–45.0)
Hemoglobin: 13.9 g/dL (ref 11.7–15.5)
LYMPHS PCT: 29 %
Lymphs Abs: 1044 cells/uL (ref 850–3900)
MCH: 29.3 pg (ref 27.0–33.0)
MCHC: 33.4 g/dL (ref 32.0–36.0)
MCV: 87.8 fL (ref 80.0–100.0)
MONO ABS: 396 {cells}/uL (ref 200–950)
MONOS PCT: 11 %
MPV: 10.1 fL (ref 7.5–12.5)
NEUTROS PCT: 53 %
Neutro Abs: 1908 cells/uL (ref 1500–7800)
Platelets: 201 10*3/uL (ref 140–400)
RBC: 4.74 MIL/uL (ref 3.80–5.10)
RDW: 14 % (ref 11.0–15.0)
WBC: 3.6 10*3/uL — AB (ref 3.8–10.8)

## 2015-05-29 LAB — LIPID PANEL
CHOL/HDL RATIO: 2.1 ratio (ref <=5.0)
CHOLESTEROL: 181 mg/dL (ref 125–200)
HDL: 88 mg/dL (ref >=46)
LDL Cholesterol: 84 mg/dL (ref <130)
TRIGLYCERIDES: 43 mg/dL (ref <150)
VLDL: 9 mg/dL (ref <30)

## 2015-05-29 LAB — COMPLETE METABOLIC PANEL WITH GFR
ALT: 12 U/L (ref 6–29)
AST: 14 U/L (ref 10–35)
Albumin: 4.4 g/dL (ref 3.6–5.1)
Alkaline Phosphatase: 57 U/L (ref 33–130)
BUN: 16 mg/dL (ref 7–25)
CHLORIDE: 104 mmol/L (ref 98–110)
CO2: 27 mmol/L (ref 20–31)
Calcium: 9.6 mg/dL (ref 8.6–10.4)
Creat: 0.8 mg/dL (ref 0.50–0.99)
GFR, Est Non African American: 80 mL/min (ref >=60)
Glucose, Bld: 113 mg/dL — ABNORMAL HIGH (ref 65–99)
POTASSIUM: 4.6 mmol/L (ref 3.5–5.3)
Sodium: 139 mmol/L (ref 135–146)
Total Bilirubin: 0.5 mg/dL (ref 0.2–1.2)
Total Protein: 6.6 g/dL (ref 6.1–8.1)

## 2015-05-29 LAB — TSH: TSH: 1.77 mIU/L

## 2015-05-29 LAB — HEMOGLOBIN A1C
Hgb A1c MFr Bld: 5.5 % (ref <5.7)
Mean Plasma Glucose: 111 mg/dL

## 2015-05-30 LAB — VITAMIN D 25 HYDROXY (VIT D DEFICIENCY, FRACTURES): Vit D, 25-Hydroxy: 48 ng/mL (ref 30–100)

## 2015-05-31 ENCOUNTER — Encounter: Payer: Self-pay | Admitting: Internal Medicine

## 2015-05-31 ENCOUNTER — Other Ambulatory Visit (HOSPITAL_COMMUNITY)
Admission: RE | Admit: 2015-05-31 | Discharge: 2015-05-31 | Disposition: A | Discharge status: Home / Self Care | Payer: Self-pay | Source: Ambulatory Visit | Attending: Internal Medicine | Admitting: Internal Medicine

## 2015-05-31 ENCOUNTER — Ambulatory Visit (INDEPENDENT_AMBULATORY_CARE_PROVIDER_SITE_OTHER): Payer: Commercial Managed Care - HMO | Admitting: Internal Medicine

## 2015-05-31 VITALS — BP 128/84 | HR 78 | Temp 98.0°F | Resp 18 | Wt 174.0 lb

## 2015-05-31 DIAGNOSIS — G8929 Other chronic pain: Secondary | ICD-10-CM | POA: Diagnosis not present

## 2015-05-31 DIAGNOSIS — Z124 Encounter for screening for malignant neoplasm of cervix: Secondary | ICD-10-CM | POA: Diagnosis not present

## 2015-05-31 DIAGNOSIS — G4700 Insomnia, unspecified: Secondary | ICD-10-CM

## 2015-05-31 DIAGNOSIS — E039 Hypothyroidism, unspecified: Secondary | ICD-10-CM | POA: Diagnosis not present

## 2015-05-31 DIAGNOSIS — E669 Obesity, unspecified: Secondary | ICD-10-CM

## 2015-05-31 DIAGNOSIS — I10 Essential (primary) hypertension: Secondary | ICD-10-CM | POA: Diagnosis not present

## 2015-05-31 DIAGNOSIS — Z0000 Encounter for general adult medical examination without abnormal findings: Secondary | ICD-10-CM

## 2015-05-31 DIAGNOSIS — Z01419 Encounter for gynecological examination (general) (routine) without abnormal findings: Secondary | ICD-10-CM | POA: Insufficient documentation

## 2015-05-31 DIAGNOSIS — M898X1 Other specified disorders of bone, shoulder: Secondary | ICD-10-CM | POA: Diagnosis not present

## 2015-05-31 LAB — POCT URINALYSIS DIPSTICK
BILIRUBIN UA: NEGATIVE
Blood, UA: NEGATIVE
GLUCOSE UA: NEGATIVE
KETONES UA: NEGATIVE
LEUKOCYTES UA: NEGATIVE
NITRITE UA: NEGATIVE
PH UA: 7
Protein, UA: NEGATIVE
Spec Grav, UA: 1.015
Urobilinogen, UA: 0.2

## 2015-05-31 NOTE — Progress Notes (Signed)
   Subjective:    Patient ID: Jordan Gregory, female    DOB: 1955/11/28, 60 y.o.   MRN: [id number]  HPI 60 year old Female in today for health maintenance exam and evaluation of medical issues. Is feeling some discomfort along her medial right scapular area. Thinks is musculoskeletal pain. Have suggested she try over-the-counter anti-inflammatory medication along with ice or heat. She may want to consider massage therapy. She has a history of anxiety, hypertension, insomnia, hypothyroidism, vitamin D deficiency, palpitations and obesity.  Cannot take Ramapril due to cough. Has seen Dr. Johnsie Cancel for palpitations in the past and thought to be benign. Had negative stress echocardiogram a cardiology evaluation.  No known drug allergies  Colonoscopy done by Dr. Sharlett Iles in 2012 and no polyps identified. 10 year follow-up recommended.  History of fractured wrist, fractured clavicle and fractured leg in childhood.  History of right shoulder pain evaluated by Dr. Daylene Katayama in 2009 showing type II acromion on with impingement. Physical therapy recommended.   Social history: She has a Scientist, water quality in art history. She works at [employer]. She is married. Likes to travel. No children. Does not smoke. Social alcohol consumption.  Family history: Father with history of arrhythmia. Mother with history of arthritis. 2 brothers in good health.    Review of Systems  Constitutional: Negative.   All other systems reviewed and are negative.      Objective:   Physical Exam  Constitutional: She is oriented to person, place, and time. She appears well-developed and well-nourished. No distress.  HENT:  Head: Normocephalic and atraumatic.  Right Ear: External ear normal.  Mouth/Throat: Oropharynx is clear and moist. No oropharyngeal exudate.  Eyes: Conjunctivae and EOM are normal. Pupils are equal, round, and reactive to light. Right eye exhibits no discharge. Left eye exhibits no discharge.  No scleral icterus.  Neck: No JVD present. No thyromegaly present.  Cardiovascular: Normal rate, normal heart sounds and intact distal pulses.   No murmur heard. Pulmonary/Chest: Effort normal and breath sounds normal. No respiratory distress. She has no wheezes. She has no rales. She exhibits no tenderness.  Breasts normal female  Abdominal: Soft. Bowel sounds are normal. She exhibits no distension and no mass. There is no tenderness. There is no rebound and no guarding.  Genitourinary:  Pap taken. Bimanual normal.  Musculoskeletal: Normal range of motion. She exhibits no edema.  Lymphadenopathy:    She has no cervical adenopathy.  Neurological: She is alert and oriented to person, place, and time. She has normal reflexes. No cranial nerve deficit. Coordination normal.  Skin: Skin is warm and dry. No rash noted. She is not diaphoretic.  Psychiatric: She has a normal mood and affect. Her behavior is normal. Judgment and thought content normal.  Vitals reviewed.         Assessment & Plan:  Essential hypertension stable on current regimen  Insomnia  History of vitamin D deficiency-vitamin D level is normal  Musculoskeletal pain in scapular area  Hypothyroidism-stable on thyroid replacement  History of palpitations-benign cardiology workup  Impaired glucose tolerance-hemoglobin A1c 5.7% in 2016. Hemoglobin A1c now 5.5%  Plan: Return in 6 months or as needed

## 2015-06-05 LAB — CYTOLOGY - PAP

## 2015-06-06 NOTE — Patient Instructions (Signed)
It was a pleasure to see you today. Consider massage therapy for scapular pain. Continue same medications and return in 6 months.

## 2015-06-07 ENCOUNTER — Other Ambulatory Visit: Payer: Self-pay | Admitting: Internal Medicine

## 2015-06-08 NOTE — Telephone Encounter (Signed)
Refill for 90 days

## 2015-06-08 NOTE — Telephone Encounter (Signed)
Phoned to pharmacy 

## 2015-06-25 ENCOUNTER — Other Ambulatory Visit: Payer: Self-pay | Admitting: Internal Medicine

## 2015-07-19 ENCOUNTER — Other Ambulatory Visit: Payer: Self-pay | Admitting: Internal Medicine

## 2015-07-20 NOTE — Telephone Encounter (Signed)
Phoned to pharmacy 

## 2015-07-20 NOTE — Telephone Encounter (Signed)
Refill x 90 days 

## 2015-08-07 ENCOUNTER — Other Ambulatory Visit: Payer: Self-pay | Admitting: Internal Medicine

## 2015-11-04 ENCOUNTER — Other Ambulatory Visit: Payer: Self-pay | Admitting: Internal Medicine

## 2015-12-02 ENCOUNTER — Other Ambulatory Visit: Payer: Self-pay | Admitting: Internal Medicine

## 2015-12-23 ENCOUNTER — Other Ambulatory Visit: Payer: Self-pay | Admitting: Internal Medicine

## 2015-12-24 NOTE — Telephone Encounter (Signed)
Pt was to have been seen in November for 6 month recheck. Please call her before refilling.

## 2016-01-10 ENCOUNTER — Ambulatory Visit: Payer: Commercial Managed Care - HMO | Admitting: Internal Medicine

## 2016-01-10 ENCOUNTER — Telehealth: Payer: Self-pay | Admitting: Internal Medicine

## 2016-01-10 NOTE — Telephone Encounter (Signed)
Did not keep appointment for follow-up regarding anxiety, hypertension and hypothyroidism. Did not answer phone.

## 2016-01-10 NOTE — Telephone Encounter (Signed)
Please call pt.  Needs q 6 month recheck to get meds refilled Was last here in May.

## 2016-01-10 NOTE — Telephone Encounter (Signed)
Patient called and stated that she did not make that appointment nor did she have any record of it. She states that she did get an appointment reminder but left a message stating that she did not make any appointment and she's not even sure why she would have to be seen. She said that she hasn't spoken with anyone in the office since she was last here in May, and did not schedule that appointment. Patient did inquire what the appointment was about and stated that her thyroid is fine. Patient was advised that MD would be made aware.

## 2016-01-10 NOTE — Telephone Encounter (Signed)
Left message on machine to call back and schedule f/u appt.

## 2016-01-21 ENCOUNTER — Other Ambulatory Visit: Payer: Self-pay | Admitting: Internal Medicine

## 2016-02-12 ENCOUNTER — Encounter: Payer: Self-pay | Admitting: Adult Health

## 2016-02-12 ENCOUNTER — Ambulatory Visit (INDEPENDENT_AMBULATORY_CARE_PROVIDER_SITE_OTHER): Payer: BLUE CROSS/BLUE SHIELD | Admitting: Adult Health

## 2016-02-12 VITALS — BP 130/86 | Temp 97.7°F | Ht 68.0 in | Wt 185.8 lb

## 2016-02-12 DIAGNOSIS — I159 Secondary hypertension, unspecified: Secondary | ICD-10-CM

## 2016-02-12 DIAGNOSIS — E039 Hypothyroidism, unspecified: Secondary | ICD-10-CM | POA: Diagnosis not present

## 2016-02-12 DIAGNOSIS — G4700 Insomnia, unspecified: Secondary | ICD-10-CM | POA: Diagnosis not present

## 2016-02-12 DIAGNOSIS — Z7689 Persons encountering health services in other specified circumstances: Secondary | ICD-10-CM | POA: Diagnosis not present

## 2016-02-12 MED ORDER — LEVOTHYROXINE SODIUM 50 MCG PO TABS: 50.0000 ug | ORAL_TABLET | Freq: Every day | ORAL | 1 refills | Status: DC

## 2016-02-12 NOTE — Progress Notes (Signed)
Patient presents to clinic today to establish care. She is a pleasant 61 year old female who  has a past medical history of Hypertension; Insomnia; Thyroid disease; and Vitamin D deficiency.   Her last physical was in 2017   Acute Concerns: Establish Care   Chronic Issues: Essential Hypertension - She feels as as though it is well controlled on current medication   Insomnia - She has been without [place] for the last two months and feels as though she is doing well without it.   Health Maintenance: Dental --Routine Care  Vision -- Routine Care  Immunizations -- UTD  Colonoscopy -- 2012 - normal  Mammogram -- May 2017  PAP --  May 2017  Bone Density -- Never had  Diet: She eats healthy  Exercise: She likes to stay active and cycle   Past Medical History:  Diagnosis Date  . Hypertension   . Insomnia   . Thyroid disease   . Vitamin D deficiency     No past surgical history on file.  Current Outpatient Prescriptions on File Prior to Visit  Medication Sig Dispense Refill  . ALPRAZolam (XANAX) 0.25 MG tablet TAKE 1 TABLET BY MOUTH TWICE A DAY AS NEEDED FOR ANXIETY 60 tablet 2  . aspirin EC 81 MG tablet Take 81 mg by mouth daily.    . calcium carbonate (OS-CAL) 600 MG TABS Take 600 mg by mouth daily.    Marland Kitchen losartan (COZAAR) 100 MG tablet TAKE 1 TABLET (100 MG TOTAL) BY MOUTH DAILY. 90 tablet 1  . SYNTHROID 50 MCG tablet TAKE ONE TABLET BY MOUTH DAILY 30 tablet 0  . VITAMIN D, CHOLECALCIFEROL, PO Take 2,000 Int'l Units by mouth daily.     Marland Kitchen zolpidem (AMBIEN) 10 MG tablet TAKE ONE-HALF TO ONE TABLET BY MOUTH AT BEDTIME AS NEEDED 30 tablet 2   No current facility-administered medications on file prior to visit.     Allergies  Allergen Reactions  . Ramipril Cough    Family History  Problem Relation Age of Onset  . Arthritis Mother   . Heart disease Father     Social History   Social History  . Marital status: Married    Spouse name: N/A  . Number of children: N/A   . Years of education: N/A   Occupational History  . Not on file.   Social History Main Topics  . Smoking status: Never Smoker  . Smokeless tobacco: Never Used  . Alcohol use 7.0 oz/week    14 drink(s) per week     Comment: social  . Drug use: No  . Sexual activity: Yes    Birth control/ protection: Post-menopausal   Other Topics Concern  . Not on file   Social History Narrative  . No narrative on file    Review of Systems  Constitutional: Negative.   HENT: Negative.   Eyes: Negative.   Respiratory: Negative.   Cardiovascular: Negative.   Gastrointestinal: Negative.   Musculoskeletal: Negative.   Skin: Negative.   Neurological: Negative.   Endo/Heme/Allergies: Negative.   Psychiatric/Behavioral: Negative.   All other systems reviewed and are negative.   BP (!) 150/82   Temp 97.7 F (36.5 C) (Oral)   Ht 5\' 8"  (1.727 m)   Wt 185 lb 12.8 oz (84.3 kg)   LMP 03/04/2011   BMI 28.25 kg/m   Physical Exam  Constitutional: She is oriented to person, place, and time. No distress.  Cardiovascular: Normal rate, regular rhythm, normal heart  sounds and intact distal pulses.  Exam reveals no gallop and no friction rub.   No murmur heard. Pulmonary/Chest: Effort normal and breath sounds normal. No respiratory distress. She has no wheezes. She has no rales. She exhibits no tenderness.  Neurological: She is alert and oriented to person, place, and time. She has normal reflexes. No cranial nerve deficit. She exhibits normal muscle tone. Gait normal. Coordination normal. GCS score is 15.  Skin: Skin is warm and dry. No rash noted. She is not diaphoretic. No erythema. No pallor.  Psychiatric: Mood, memory, affect and judgment normal.  Nursing note and vitals reviewed.  Assessment/Plan: 1. Encounter to establish care - Follow up in May for next CPE or sooner if needed - Continue to eat healthy and exercise   2. Insomnia, unspecified type - Advised melatonin as needed  3.  Secondary hypertension - Near goal - No change in medication at this time  4. Hypothyroidism, unspecified type  - levothyroxine (SYNTHROID) 50 MCG tablet; Take 1 tablet (50 mcg total) by mouth daily.  Dispense: 90 tablet; Refill: 1  Dorothyann Peng, NP

## 2016-05-06 ENCOUNTER — Other Ambulatory Visit (INDEPENDENT_AMBULATORY_CARE_PROVIDER_SITE_OTHER): Payer: BLUE CROSS/BLUE SHIELD

## 2016-05-06 DIAGNOSIS — Z0000 Encounter for general adult medical examination without abnormal findings: Secondary | ICD-10-CM | POA: Diagnosis not present

## 2016-05-06 LAB — BASIC METABOLIC PANEL
BUN: 20 mg/dL (ref 6–23)
CALCIUM: 9.5 mg/dL (ref 8.4–10.5)
CHLORIDE: 105 meq/L (ref 96–112)
CO2: 28 meq/L (ref 19–32)
CREATININE: 0.86 mg/dL (ref 0.40–1.20)
GFR: 71.09 mL/min (ref >60.00)
GLUCOSE: 110 mg/dL — AB (ref 70–99)
Potassium: 4.3 mEq/L (ref 3.5–5.1)
Sodium: 141 mEq/L (ref 135–145)

## 2016-05-06 LAB — POC URINALSYSI DIPSTICK (AUTOMATED)
BILIRUBIN UA: NEGATIVE
GLUCOSE UA: NEGATIVE
Ketones, UA: NEGATIVE
LEUKOCYTES UA: NEGATIVE
NITRITE UA: NEGATIVE
Protein, UA: NEGATIVE
Spec Grav, UA: 1.015 (ref 1.010–1.025)
UROBILINOGEN UA: 0.2 U/dL
pH, UA: 6 (ref 5.0–8.0)

## 2016-05-06 LAB — CBC WITH DIFFERENTIAL/PLATELET
BASOS ABS: 0 10*3/uL (ref 0.0–0.1)
Basophils Relative: 1.1 % (ref 0.0–3.0)
EOS ABS: 0.2 10*3/uL (ref 0.0–0.7)
Eosinophils Relative: 4.3 % (ref 0.0–5.0)
HCT: 41 % (ref 36.0–46.0)
Hemoglobin: 14 g/dL (ref 12.0–15.0)
LYMPHS ABS: 1.1 10*3/uL (ref 0.7–4.0)
Lymphocytes Relative: 26.5 % (ref 12.0–46.0)
MCHC: 34.1 g/dL (ref 30.0–36.0)
MCV: 87.1 fl (ref 78.0–100.0)
MONOS PCT: 8.3 % (ref 3.0–12.0)
Monocytes Absolute: 0.3 10*3/uL (ref 0.1–1.0)
NEUTROS ABS: 2.4 10*3/uL (ref 1.4–7.7)
NEUTROS PCT: 59.8 % (ref 43.0–77.0)
PLATELETS: 204 10*3/uL (ref 150.0–400.0)
RBC: 4.71 Mil/uL (ref 3.87–5.11)
RDW: 13.2 % (ref 11.5–15.5)
WBC: 4 10*3/uL (ref 4.0–10.5)

## 2016-05-06 LAB — HEPATIC FUNCTION PANEL
ALBUMIN: 4.5 g/dL (ref 3.5–5.2)
ALK PHOS: 55 U/L (ref 39–117)
ALT: 13 U/L (ref 0–35)
AST: 15 U/L (ref 0–37)
Bilirubin, Direct: 0.1 mg/dL (ref 0.0–0.3)
TOTAL PROTEIN: 6.9 g/dL (ref 6.0–8.3)
Total Bilirubin: 0.6 mg/dL (ref 0.2–1.2)

## 2016-05-06 LAB — TSH: TSH: 2.08 u[IU]/mL (ref 0.35–4.50)

## 2016-05-06 LAB — LIPID PANEL
Cholesterol: 195 mg/dL (ref 0–200)
HDL: 78.6 mg/dL (ref >39.00)
LDL Cholesterol: 108 mg/dL — ABNORMAL HIGH (ref 0–99)
NONHDL: 116.86
TRIGLYCERIDES: 46 mg/dL (ref 0.0–149.0)
Total CHOL/HDL Ratio: 2
VLDL: 9.2 mg/dL (ref 0.0–40.0)

## 2016-05-13 ENCOUNTER — Ambulatory Visit (INDEPENDENT_AMBULATORY_CARE_PROVIDER_SITE_OTHER): Payer: BLUE CROSS/BLUE SHIELD | Admitting: Adult Health

## 2016-05-13 ENCOUNTER — Encounter: Payer: Self-pay | Admitting: Adult Health

## 2016-05-13 VITALS — BP 136/78 | Temp 98.0°F | Ht 68.0 in | Wt 187.9 lb

## 2016-05-13 DIAGNOSIS — Z0000 Encounter for general adult medical examination without abnormal findings: Secondary | ICD-10-CM

## 2016-05-13 DIAGNOSIS — R7309 Other abnormal glucose: Secondary | ICD-10-CM | POA: Diagnosis not present

## 2016-05-13 DIAGNOSIS — I159 Secondary hypertension, unspecified: Secondary | ICD-10-CM

## 2016-05-13 DIAGNOSIS — E039 Hypothyroidism, unspecified: Secondary | ICD-10-CM | POA: Diagnosis not present

## 2016-05-13 LAB — POCT GLYCOSYLATED HEMOGLOBIN (HGB A1C): Hemoglobin A1C: 5.4

## 2016-05-13 MED ORDER — LEVOTHYROXINE SODIUM 50 MCG PO TABS: 50.0000 ug | ORAL_TABLET | Freq: Every day | ORAL | 3 refills | Status: DC

## 2016-05-13 MED ORDER — LOSARTAN POTASSIUM 100 MG PO TABS: 100.0000 mg | ORAL_TABLET | Freq: Every day | ORAL | 3 refills | Status: DC

## 2016-05-13 NOTE — Patient Instructions (Addendum)
It was great seeing you today   Your A1c is 5.4 - this is borderline for borderline diabeties   Please work on diet and exercise   Follow up in one year or sooner if needed

## 2016-05-13 NOTE — Progress Notes (Signed)
Subjective:    Patient ID: Jordan Gregory, female    DOB: 1955/05/26, 61 y.o.   MRN: [id number]  HPI  Patient presents for yearly preventative medicine examination. She is a pleasant 61 year old female who  has a past medical history of Hypertension; Insomnia; Thyroid disease; and Vitamin D deficiency.  All immunizations and health maintenance protocols were reviewed with the patient and needed orders were placed.  Appropriate screening laboratory values were ordered for the patient including screening of hyperlipidemia, renal function and hepatic function.  Medication reconciliation,  past medical history, social history, problem list and allergies were reviewed in detail with the patient  Goals were established with regard to weight loss, exercise, and diet in compliance with medications  End of life planning was discussed.  She does routine dental and vision screens. She is up to date on her colonoscopy. She has a mammogram in two weeks.   Review of Systems  Constitutional: Negative.   HENT: Negative.   Eyes: Negative.   Respiratory: Negative.   Cardiovascular: Negative.   Gastrointestinal: Negative.   Endocrine: Negative.   Genitourinary: Negative.   Musculoskeletal: Negative.   Skin: Negative.   Allergic/Immunologic: Negative.   Neurological: Negative.   Hematological: Negative.   Psychiatric/Behavioral: Negative.   All other systems reviewed and are negative.  Past Medical History:  Diagnosis Date  . Hypertension   . Insomnia   . Thyroid disease   . Vitamin D deficiency     Social History   Social History  . Marital status: Married    Spouse name: N/A  . Number of children: N/A  . Years of education: N/A   Occupational History  . Not on file.   Social History Main Topics  . Smoking status: Never Smoker  . Smokeless tobacco: Never Used  . Alcohol use 7.0 oz/week    14 drink(s) per week     Comment: social  . Drug use: No  . Sexual  activity: Yes    Birth control/ protection: Post-menopausal   Other Topics Concern  . Not on file   Social History Narrative   She works at [employer].    Married - 23 years    No kids       She likes to travel, read, cycle, be outside.     Past Surgical History:  Procedure Laterality Date  . MOHS SURGERY  2016    Family History  Problem Relation Age of Onset  . Arthritis Mother   . Hypertension Mother   . Heart disease Father     Allergies  Allergen Reactions  . Ramipril Cough    Current Outpatient Prescriptions on File Prior to Visit  Medication Sig Dispense Refill  . aspirin EC 81 MG tablet Take 81 mg by mouth daily.    . calcium carbonate (OS-CAL) 600 MG TABS Take 600 mg by mouth daily.    Marland Kitchen levothyroxine (SYNTHROID) 50 MCG tablet Take 1 tablet (50 mcg total) by mouth daily. 90 tablet 1  . losartan (COZAAR) 100 MG tablet TAKE 1 TABLET (100 MG TOTAL) BY MOUTH DAILY. 90 tablet 1  . VITAMIN D, CHOLECALCIFEROL, PO Take 2,000 Int'l Units by mouth daily.      No current facility-administered medications on file prior to visit.     BP 136/78 (BP Location: Left Arm, Patient Position: Sitting, Cuff Size: Normal)   Temp 98 F (36.7 C) (Oral)   Ht 5\' 8"  (1.727 m)   Wt 187  lb 14.4 oz (85.2 kg)   LMP 03/04/2011   BMI 28.57 kg/m        Objective:   Physical Exam  Constitutional: She is oriented to person, place, and time. She appears well-developed and well-nourished. No distress.  HENT:  Head: Normocephalic and atraumatic.  Right Ear: External ear normal.  Left Ear: External ear normal.  Nose: Nose normal.  Mouth/Throat: Oropharynx is clear and moist. No oropharyngeal exudate.  Eyes: Conjunctivae and EOM are normal. Pupils are equal, round, and reactive to light. Right eye exhibits no discharge. Left eye exhibits no discharge. No scleral icterus.  Neck: Normal range of motion. Neck supple. No JVD present. No tracheal deviation present. No thyromegaly  present.  Cardiovascular: Normal rate, regular rhythm, normal heart sounds and intact distal pulses.  Exam reveals no gallop and no friction rub.   No murmur heard. Pulmonary/Chest: Effort normal and breath sounds normal. No stridor. No respiratory distress. She has no wheezes. She has no rales. She exhibits no tenderness.  Abdominal: Soft. Bowel sounds are normal. She exhibits no distension and no mass. There is no tenderness. There is no rebound and no guarding.  Musculoskeletal: Normal range of motion. She exhibits no edema, tenderness or deformity.  Lymphadenopathy:    She has no cervical adenopathy.  Neurological: She is alert and oriented to person, place, and time. She has normal reflexes. She displays normal reflexes. No cranial nerve deficit. She exhibits normal muscle tone. Coordination normal.  Skin: Skin is warm and dry. No rash noted. She is not diaphoretic. No erythema. No pallor.  Psychiatric: She has a normal mood and affect. Her behavior is normal. Judgment and thought content normal.  Nursing note and vitals reviewed.     Assessment & Plan:  1. Routine general medical examination at a health care facility - Reviewed labs in detail with patient and all questions answered.  - Educated on the importance of a heart healthy diet and frequent exercise   2. Secondary hypertension - Controlled with Cozaar 100mg  daily  - losartan (COZAAR) 100 MG tablet; Take 1 tablet (100 mg total) by mouth daily.  Dispense: 90 tablet; Refill: 3  3. Hypothyroidism, unspecified type - Controlled with synthroid 50 mg  - levothyroxine (SYNTHROID) 50 MCG tablet; Take 1 tablet (50 mcg total) by mouth daily.  Dispense: 90 tablet; Refill: 3  4. Elevated glucose - Hemoglobin A1c- 5.4   Dorothyann Peng, NP

## 2016-05-26 DIAGNOSIS — Z1231 Encounter for screening mammogram for malignant neoplasm of breast: Secondary | ICD-10-CM | POA: Diagnosis not present

## 2016-05-26 LAB — HM MAMMOGRAPHY

## 2016-05-27 ENCOUNTER — Encounter: Payer: Self-pay | Admitting: Family Medicine

## 2016-10-29 DIAGNOSIS — Z85828 Personal history of other malignant neoplasm of skin: Secondary | ICD-10-CM | POA: Diagnosis not present

## 2016-10-29 DIAGNOSIS — L821 Other seborrheic keratosis: Secondary | ICD-10-CM | POA: Diagnosis not present

## 2016-10-29 DIAGNOSIS — D225 Melanocytic nevi of trunk: Secondary | ICD-10-CM | POA: Diagnosis not present

## 2016-10-29 DIAGNOSIS — D2261 Melanocytic nevi of right upper limb, including shoulder: Secondary | ICD-10-CM | POA: Diagnosis not present

## 2017-05-13 ENCOUNTER — Other Ambulatory Visit: Payer: Self-pay | Admitting: Adult Health

## 2017-05-13 DIAGNOSIS — E039 Hypothyroidism, unspecified: Secondary | ICD-10-CM

## 2017-05-13 NOTE — Telephone Encounter (Signed)
Sent to the pharmacy by e-scribe.  Pt has upcoming appt on 06/03/17

## 2017-05-28 DIAGNOSIS — Z1231 Encounter for screening mammogram for malignant neoplasm of breast: Secondary | ICD-10-CM | POA: Diagnosis not present

## 2017-05-28 LAB — HM MAMMOGRAPHY

## 2017-06-03 ENCOUNTER — Encounter: Payer: Self-pay | Admitting: Adult Health

## 2017-06-03 ENCOUNTER — Ambulatory Visit (INDEPENDENT_AMBULATORY_CARE_PROVIDER_SITE_OTHER): Payer: BLUE CROSS/BLUE SHIELD | Admitting: Adult Health

## 2017-06-03 VITALS — BP 126/82 | Temp 97.6°F | Ht 67.5 in | Wt 190.0 lb

## 2017-06-03 DIAGNOSIS — Z1159 Encounter for screening for other viral diseases: Secondary | ICD-10-CM

## 2017-06-03 DIAGNOSIS — Z0000 Encounter for general adult medical examination without abnormal findings: Secondary | ICD-10-CM

## 2017-06-03 DIAGNOSIS — E039 Hypothyroidism, unspecified: Secondary | ICD-10-CM | POA: Diagnosis not present

## 2017-06-03 DIAGNOSIS — Z114 Encounter for screening for human immunodeficiency virus [HIV]: Secondary | ICD-10-CM

## 2017-06-03 DIAGNOSIS — I159 Secondary hypertension, unspecified: Secondary | ICD-10-CM

## 2017-06-03 LAB — CBC WITH DIFFERENTIAL/PLATELET
BASOS ABS: 0.1 10*3/uL (ref 0.0–0.1)
BASOS PCT: 2.4 % (ref 0.0–3.0)
EOS ABS: 0.1 10*3/uL (ref 0.0–0.7)
Eosinophils Relative: 4.8 % (ref 0.0–5.0)
HEMATOCRIT: 41.8 % (ref 36.0–46.0)
Hemoglobin: 14.3 g/dL (ref 12.0–15.0)
LYMPHS PCT: 34.5 % (ref 12.0–46.0)
Lymphs Abs: 1.1 10*3/uL (ref 0.7–4.0)
MCHC: 34.3 g/dL (ref 30.0–36.0)
MCV: 88.3 fl (ref 78.0–100.0)
Monocytes Absolute: 0.3 10*3/uL (ref 0.1–1.0)
Monocytes Relative: 8.7 % (ref 3.0–12.0)
Neutro Abs: 1.5 10*3/uL (ref 1.4–7.7)
Neutrophils Relative %: 49.6 % (ref 43.0–77.0)
Platelets: 217 10*3/uL (ref 150.0–400.0)
RBC: 4.73 Mil/uL (ref 3.87–5.11)
RDW: 13.3 % (ref 11.5–15.5)
WBC: 3.1 10*3/uL — ABNORMAL LOW (ref 4.0–10.5)

## 2017-06-03 LAB — BASIC METABOLIC PANEL
BUN: 20 mg/dL (ref 6–23)
CALCIUM: 9.5 mg/dL (ref 8.4–10.5)
CHLORIDE: 104 meq/L (ref 96–112)
CO2: 28 mEq/L (ref 19–32)
CREATININE: 0.77 mg/dL (ref 0.40–1.20)
GFR: 80.48 mL/min (ref >60.00)
Glucose, Bld: 105 mg/dL — ABNORMAL HIGH (ref 70–99)
Potassium: 4.4 mEq/L (ref 3.5–5.1)
Sodium: 141 mEq/L (ref 135–145)

## 2017-06-03 LAB — LIPID PANEL
CHOL/HDL RATIO: 3
Cholesterol: 206 mg/dL — ABNORMAL HIGH (ref 0–200)
HDL: 72.3 mg/dL (ref >39.00)
LDL Cholesterol: 120 mg/dL — ABNORMAL HIGH (ref 0–99)
NONHDL: 133.2
Triglycerides: 67 mg/dL (ref 0.0–149.0)
VLDL: 13.4 mg/dL (ref 0.0–40.0)

## 2017-06-03 LAB — TSH: TSH: 1.59 u[IU]/mL (ref 0.35–4.50)

## 2017-06-03 LAB — HEPATIC FUNCTION PANEL
ALK PHOS: 58 U/L (ref 39–117)
ALT: 15 U/L (ref 0–35)
AST: 15 U/L (ref 0–37)
Albumin: 4.6 g/dL (ref 3.5–5.2)
BILIRUBIN DIRECT: 0.1 mg/dL (ref 0.0–0.3)
TOTAL PROTEIN: 7.2 g/dL (ref 6.0–8.3)
Total Bilirubin: 0.7 mg/dL (ref 0.2–1.2)

## 2017-06-03 NOTE — Progress Notes (Signed)
Subjective:    Patient ID: Jordan Gregory, female    DOB: [birth year], 62 y.o.   MRN: [id number]  HPI  Patient presents for yearly preventative medicine examination. She is a pleasant 62 year old female who  has a past medical history of Hypertension, Insomnia, Thyroid disease, and Vitamin D deficiency.  Essential Hypertension - is well controlled with Cozzar 100 mg   BP Readings from Last 3 Encounters:  06/03/17 126/82  05/13/16 136/78  02/12/16 130/86   Hypothyroidism - Well controlled with Synthroid 50 mcg   All immunizations and health maintenance protocols were reviewed with the patient and needed orders were placed. She is UTD   Appropriate screening laboratory values were ordered for the patient including screening of hyperlipidemia, renal function and hepatic function.  Medication reconciliation,  past medical history, social history, problem list and allergies were reviewed in detail with the patient  Goals were established with regard to weight loss, exercise, and  diet in compliance with medications. She eats a heart healthy diet and stays active with cycling   Wt Readings from Last 3 Encounters:  06/03/17 190 lb (86.2 kg)  05/13/16 187 lb 14.4 oz (85.2 kg)  02/12/16 185 lb 12.8 oz (84.3 kg)   End of life planning was discussed.  She participates in routine dental and vision screens.  She is up-to-date on health maintenance items such as colonoscopy, mammogram,and Pap smear. She has an upcoming dermatology appointment   She has no acute complaints.   Review of Systems  Constitutional: Negative.   HENT: Negative.   Eyes: Negative.   Respiratory: Negative.   Cardiovascular: Negative.   Gastrointestinal: Negative.   Endocrine: Negative.   Genitourinary: Negative.   Musculoskeletal: Negative.   Skin: Negative.   Allergic/Immunologic: Negative.   Neurological: Negative.   Hematological: Negative.   Psychiatric/Behavioral: Negative.    Past Medical  History:  Diagnosis Date  . Hypertension   . Insomnia   . Thyroid disease   . Vitamin D deficiency     Social History   Socioeconomic History  . Marital status: Married    Spouse name: Not on file  . Number of children: Not on file  . Years of education: Not on file  . Highest education level: Not on file  Occupational History  . Not on file  Social Needs  . Financial resource strain: Not on file  . Food insecurity:    Worry: Not on file    Inability: Not on file  . Transportation needs:    Medical: Not on file    Non-medical: Not on file  Tobacco Use  . Smoking status: Never Smoker  . Smokeless tobacco: Never Used  Substance and Sexual Activity  . Alcohol use: Yes    Alcohol/week: 7.0 oz    Types: 14 drink(s) per week    Comment: social  . Drug use: No  . Sexual activity: Yes    Birth control/protection: Post-menopausal  Lifestyle  . Physical activity:    Days per week: Not on file    Minutes per session: Not on file  . Stress: Not on file  Relationships  . Social connections:    Talks on phone: Not on file    Gets together: Not on file    Attends religious service: Not on file    Active member of club or organization: Not on file    Attends meetings of clubs or organizations: Not on file    Relationship status: Not on  file  . Intimate partner violence:    Fear of current or ex partner: Not on file    Emotionally abused: Not on file    Physically abused: Not on file    Forced sexual activity: Not on file  Other Topics Concern  . Not on file  Social History Narrative   She works at [employer] and [employer].    Married - 23 years    No kids       She likes to travel, read, cycle, be outside.     Past Surgical History:  Procedure Laterality Date  . MOHS SURGERY  2016    Family History  Problem Relation Age of Onset  . Arthritis Mother   . Hypertension Mother   . Heart disease Father     Allergies  Allergen Reactions  . Ramipril Cough    Current  Outpatient Medications on File Prior to Visit  Medication Sig Dispense Refill  . calcium carbonate (OS-CAL) 600 MG TABS Take 600 mg by mouth daily.    Marland Kitchen losartan (COZAAR) 100 MG tablet Take 1 tablet (100 mg total) by mouth daily. 90 tablet 3  . SYNTHROID 50 MCG tablet TAKE 1 TABLET BY MOUTH EVERY DAY 90 tablet 0  . VITAMIN D, CHOLECALCIFEROL, PO Take 2,000 Int'l Units by mouth daily.      No current facility-administered medications on file prior to visit.     BP 126/82   Temp 97.6 F (36.4 C) (Oral)   Ht 5' 7.5" (1.715 m)   Wt 190 lb (86.2 kg)   LMP 03/04/2011   BMI 29.32 kg/m       Objective:   Physical Exam  Constitutional: She is oriented to person, place, and time. She appears well-developed and well-nourished. No distress.  Overweight    HENT:  Head: Normocephalic and atraumatic.  Right Ear: External ear normal.  Left Ear: External ear normal.  Nose: Nose normal.  Mouth/Throat: Oropharynx is clear and moist. No oropharyngeal exudate.  Eyes: Pupils are equal, round, and reactive to light. Conjunctivae and EOM are normal. Right eye exhibits no discharge. Left eye exhibits no discharge. No scleral icterus.  Neck: Normal range of motion. Neck supple. No JVD present. No tracheal deviation present. No thyromegaly present.  Cardiovascular: Normal rate, regular rhythm, normal heart sounds and intact distal pulses. Exam reveals no gallop and no friction rub.  No murmur heard. Pulmonary/Chest: Effort normal and breath sounds normal. No stridor. No respiratory distress. She has no wheezes. She has no rales. She exhibits no tenderness.  Abdominal: Soft. Bowel sounds are normal. She exhibits no distension and no mass. There is no tenderness. There is no rebound and no guarding. No hernia.  Genitourinary:  Genitourinary Comments: Refused breast exam    Musculoskeletal: Normal range of motion. She exhibits no edema, tenderness or deformity.  Lymphadenopathy:    She has no cervical  adenopathy.  Neurological: She is alert and oriented to person, place, and time. She displays normal reflexes. No cranial nerve deficit or sensory deficit. She exhibits normal muscle tone. Coordination normal.  Skin: Skin is warm and dry. Capillary refill takes less than 2 seconds. No rash noted. She is not diaphoretic. No erythema. No pallor.  Psychiatric: She has a normal mood and affect. Her behavior is normal. Judgment and thought content normal.  Nursing note and vitals reviewed.     Assessment & Plan:  1. Routine general medical examination at a health care facility - Continue to diet and  exercise  - Follow up in one year or sooner if needed - Basic metabolic panel - CBC with Differential/Platelet - Hepatic function panel - Lipid panel - TSH  2. Hypothyroidism, unspecified type - Consider dose change of synthroid  - Basic metabolic panel - CBC with Differential/Platelet - Hepatic function panel - Lipid panel - TSH  3. Secondary hypertension - No change in medication  - Basic metabolic panel - CBC with Differential/Platelet - Hepatic function panel - Lipid panel - TSH  4. Need for hepatitis C screening test  - Hep C Antibody  5. Encounter for screening for HIV  - HIV antibody  Dorothyann Peng, NP

## 2017-06-04 ENCOUNTER — Encounter: Payer: Self-pay | Admitting: Family Medicine

## 2017-06-04 LAB — HEPATITIS C ANTIBODY
HEP C AB: NONREACTIVE
SIGNAL TO CUT-OFF: 0.03 (ref <1.00)

## 2017-06-04 LAB — HIV ANTIBODY (ROUTINE TESTING W REFLEX): HIV 1&2 Ab, 4th Generation: NONREACTIVE

## 2017-06-16 ENCOUNTER — Other Ambulatory Visit: Payer: Self-pay | Admitting: Adult Health

## 2017-06-16 DIAGNOSIS — I159 Secondary hypertension, unspecified: Secondary | ICD-10-CM

## 2017-06-16 NOTE — Telephone Encounter (Signed)
Sent to the pharmacy by e-scribe. 

## 2017-08-10 ENCOUNTER — Other Ambulatory Visit: Payer: Self-pay | Admitting: Adult Health

## 2017-08-10 DIAGNOSIS — E039 Hypothyroidism, unspecified: Secondary | ICD-10-CM

## 2017-08-11 NOTE — Telephone Encounter (Signed)
Sent to the pharmacy by e-scribe. 

## 2017-10-28 DIAGNOSIS — Z85828 Personal history of other malignant neoplasm of skin: Secondary | ICD-10-CM | POA: Diagnosis not present

## 2017-10-28 DIAGNOSIS — L821 Other seborrheic keratosis: Secondary | ICD-10-CM | POA: Diagnosis not present

## 2017-10-28 DIAGNOSIS — D2261 Melanocytic nevi of right upper limb, including shoulder: Secondary | ICD-10-CM | POA: Diagnosis not present

## 2017-10-28 DIAGNOSIS — D225 Melanocytic nevi of trunk: Secondary | ICD-10-CM | POA: Diagnosis not present

## 2018-04-29 ENCOUNTER — Other Ambulatory Visit: Payer: Self-pay | Admitting: Adult Health

## 2018-04-29 DIAGNOSIS — E039 Hypothyroidism, unspecified: Secondary | ICD-10-CM

## 2018-04-29 NOTE — Telephone Encounter (Signed)
Sent to the pharmacy by e-scribe for 90 days. 

## 2018-06-01 ENCOUNTER — Other Ambulatory Visit: Payer: Self-pay | Admitting: Adult Health

## 2018-06-01 DIAGNOSIS — I159 Secondary hypertension, unspecified: Secondary | ICD-10-CM

## 2018-06-02 NOTE — Telephone Encounter (Signed)
Sent to the pharmacy by e-scribe for 90 days. 

## 2018-07-22 ENCOUNTER — Other Ambulatory Visit: Payer: Self-pay | Admitting: Adult Health

## 2018-07-22 DIAGNOSIS — E039 Hypothyroidism, unspecified: Secondary | ICD-10-CM

## 2018-07-22 NOTE — Telephone Encounter (Signed)
Left a message for a return call.  CRM created. 

## 2018-07-23 NOTE — Telephone Encounter (Signed)
Sent to the pharmacy by e-scribe for 90 days.  Pt has upcoming appt on 10/07/2018.

## 2018-09-11 ENCOUNTER — Other Ambulatory Visit: Payer: Self-pay | Admitting: Adult Health

## 2018-09-11 DIAGNOSIS — I159 Secondary hypertension, unspecified: Secondary | ICD-10-CM

## 2018-09-14 ENCOUNTER — Encounter: Payer: Self-pay | Admitting: Family Medicine

## 2018-09-14 NOTE — Telephone Encounter (Signed)
Sent to the pharmacy by e-scribe for 30 days.  Pt is past due for cpx.  Letter mailed to the pt.

## 2018-09-20 ENCOUNTER — Encounter: Payer: Self-pay | Admitting: Adult Health

## 2018-10-07 ENCOUNTER — Ambulatory Visit (INDEPENDENT_AMBULATORY_CARE_PROVIDER_SITE_OTHER): Payer: 59 | Admitting: Adult Health

## 2018-10-07 ENCOUNTER — Telehealth: Payer: Self-pay | Admitting: Adult Health

## 2018-10-07 ENCOUNTER — Other Ambulatory Visit: Payer: Self-pay

## 2018-10-07 ENCOUNTER — Encounter: Payer: Self-pay | Admitting: Adult Health

## 2018-10-07 ENCOUNTER — Other Ambulatory Visit: Payer: Self-pay | Admitting: Adult Health

## 2018-10-07 VITALS — BP 122/86 | Temp 97.6°F | Ht 67.5 in | Wt 192.0 lb

## 2018-10-07 DIAGNOSIS — Z0000 Encounter for general adult medical examination without abnormal findings: Secondary | ICD-10-CM

## 2018-10-07 DIAGNOSIS — G479 Sleep disorder, unspecified: Secondary | ICD-10-CM

## 2018-10-07 DIAGNOSIS — I10 Essential (primary) hypertension: Secondary | ICD-10-CM

## 2018-10-07 DIAGNOSIS — Z23 Encounter for immunization: Secondary | ICD-10-CM

## 2018-10-07 DIAGNOSIS — E039 Hypothyroidism, unspecified: Secondary | ICD-10-CM

## 2018-10-07 DIAGNOSIS — I159 Secondary hypertension, unspecified: Secondary | ICD-10-CM

## 2018-10-07 LAB — LIPID PANEL
Cholesterol: 195 mg/dL (ref 0–200)
HDL: 64.3 mg/dL (ref >39.00)
LDL Cholesterol: 114 mg/dL — ABNORMAL HIGH (ref 0–99)
NonHDL: 130.35
Total CHOL/HDL Ratio: 3
Triglycerides: 80 mg/dL (ref 0.0–149.0)
VLDL: 16 mg/dL (ref 0.0–40.0)

## 2018-10-07 LAB — COMPREHENSIVE METABOLIC PANEL
ALT: 18 U/L (ref 0–35)
AST: 17 U/L (ref 0–37)
Albumin: 4.8 g/dL (ref 3.5–5.2)
Alkaline Phosphatase: 69 U/L (ref 39–117)
BUN: 17 mg/dL (ref 6–23)
CO2: 27 mEq/L (ref 19–32)
Calcium: 10.2 mg/dL (ref 8.4–10.5)
Chloride: 105 mEq/L (ref 96–112)
Creatinine, Ser: 0.82 mg/dL (ref 0.40–1.20)
GFR: 70.12 mL/min (ref >60.00)
Glucose, Bld: 105 mg/dL — ABNORMAL HIGH (ref 70–99)
Potassium: 4.1 mEq/L (ref 3.5–5.1)
Sodium: 141 mEq/L (ref 135–145)
Total Bilirubin: 0.7 mg/dL (ref 0.2–1.2)
Total Protein: 7.3 g/dL (ref 6.0–8.3)

## 2018-10-07 LAB — CBC WITH DIFFERENTIAL/PLATELET
Basophils Absolute: 0 10*3/uL (ref 0.0–0.1)
Basophils Relative: 1.2 % (ref 0.0–3.0)
Eosinophils Absolute: 0.1 10*3/uL (ref 0.0–0.7)
Eosinophils Relative: 4.1 % (ref 0.0–5.0)
HCT: 40.5 % (ref 36.0–46.0)
Hemoglobin: 13.7 g/dL (ref 12.0–15.0)
Lymphocytes Relative: 31.3 % (ref 12.0–46.0)
Lymphs Abs: 1 10*3/uL (ref 0.7–4.0)
MCHC: 33.8 g/dL (ref 30.0–36.0)
MCV: 88.7 fl (ref 78.0–100.0)
Monocytes Absolute: 0.3 10*3/uL (ref 0.1–1.0)
Monocytes Relative: 9.8 % (ref 3.0–12.0)
Neutro Abs: 1.8 10*3/uL (ref 1.4–7.7)
Neutrophils Relative %: 53.6 % (ref 43.0–77.0)
Platelets: 200 10*3/uL (ref 150.0–400.0)
RBC: 4.57 Mil/uL (ref 3.87–5.11)
RDW: 13.3 % (ref 11.5–15.5)
WBC: 3.3 10*3/uL — ABNORMAL LOW (ref 4.0–10.5)

## 2018-10-07 LAB — TSH: TSH: 2.68 u[IU]/mL (ref 0.35–4.50)

## 2018-10-07 LAB — HEMOGLOBIN A1C: Hgb A1c MFr Bld: 5.6 % (ref 4.6–6.5)

## 2018-10-07 MED ORDER — TRAZODONE HCL 50 MG PO TABS: 25.0000 mg | ORAL_TABLET | Freq: Every evening | ORAL | 1 refills | Status: DC | PRN

## 2018-10-07 MED ORDER — LEVOTHYROXINE SODIUM 50 MCG PO TABS: 50.0000 ug | ORAL_TABLET | Freq: Every day | ORAL | 3 refills | Status: DC

## 2018-10-07 NOTE — Progress Notes (Signed)
Subjective:    Patient ID: Jordan Gregory, female    DOB: 18-Apr-1955, 63 y.o.   MRN: [id number]  HPI Patient presents for yearly preventative medicine examination. She is a pleasant 63 year old female who  has a past medical history of Hypertension, Insomnia, Thyroid disease, and Vitamin D deficiency.   Since she was last seen she has retired from work  Hypothyroidism - well controlled with synthroid 50 mcg every morning   Hypertension -is well controlled with Cozaar 100 mg.  She denies dizziness, lightheadedness, chest pain, shortness of breath, or syncopal episodes BP Readings from Last 3 Encounters:  10/07/18 122/86  06/03/17 126/82  05/13/16 136/78   Insomnia -has been a longstanding issue, was on Ambien for multiple years in the past but she ended up stopping due to dependence on the medication.  As of lately she has been having a tough time falling asleep, reports that her mind feels like it will not turn off.  She is practicing good sleep hygiene.  She does fall asleep she stays asleep, often does not feel rested completely in the morning.  Denies snoring or apneic episodes  All immunizations and health maintenance protocols were reviewed with the patient and needed orders were placed. She is due for flu shot   Appropriate screening laboratory values were ordered for the patient including screening of hyperlipidemia, renal function and hepatic function. If indicated by BPH, a PSA was ordered.  Medication reconciliation,  past medical history, social history, problem list and allergies were reviewed in detail with the patient  Goals were established with regard to weight loss, exercise, and  diet in compliance with medications.  She is exercising on a daily basis and tries to eat a heart healthy diet.  She is up-to-date on her mammogram, dental and vision screens.  She is due for PAP and will follow-up with her gynecologist for this.  Review of Systems  Constitutional:  Negative.   HENT: Negative.   Eyes: Negative.   Respiratory: Negative.   Cardiovascular: Negative.   Gastrointestinal: Negative.   Endocrine: Negative.   Genitourinary: Negative.   Musculoskeletal: Negative.   Skin: Negative.   Allergic/Immunologic: Negative.   Neurological: Negative.   Hematological: Negative.   Psychiatric/Behavioral: Positive for sleep disturbance.   Past Medical History:  Diagnosis Date  . Hypertension   . Insomnia   . Thyroid disease   . Vitamin D deficiency     Social History   Socioeconomic History  . Marital status: Married    Spouse name: Not on file  . Number of children: Not on file  . Years of education: Not on file  . Highest education level: Not on file  Occupational History  . Not on file  Social Needs  . Financial resource strain: Not on file  . Food insecurity    Worry: Not on file    Inability: Not on file  . Transportation needs    Medical: Not on file    Non-medical: Not on file  Tobacco Use  . Smoking status: Never Smoker  . Smokeless tobacco: Never Used  Substance and Sexual Activity  . Alcohol use: Yes    Alcohol/week: 14.0 standard drinks    Types: 14 drink(s) per week    Comment: social  . Drug use: No  . Sexual activity: Yes    Birth control/protection: Post-menopausal  Lifestyle  . Physical activity    Days per week: Not on file    Minutes per session:  Not on file  . Stress: Not on file  Relationships  . Social Herbalist on phone: Not on file    Gets together: Not on file    Attends religious service: Not on file    Active member of club or organization: Not on file    Attends meetings of clubs or organizations: Not on file    Relationship status: Not on file  . Intimate partner violence    Fear of current or ex partner: Not on file    Emotionally abused: Not on file    Physically abused: Not on file    Forced sexual activity: Not on file  Other Topics Concern  . Not on file  Social History  Narrative   She works at [employer] and [employer].    Married - 23 years    No kids       She likes to travel, read, cycle, be outside.     Past Surgical History:  Procedure Laterality Date  . MOHS SURGERY  2016    Family History  Problem Relation Age of Onset  . Arthritis Mother   . Hypertension Mother   . Heart disease Father     Allergies  Allergen Reactions  . Ramipril Cough    Current Outpatient Medications on File Prior to Visit  Medication Sig Dispense Refill  . calcium carbonate (OS-CAL) 600 MG TABS Take 600 mg by mouth daily.    Marland Kitchen levothyroxine (SYNTHROID) 50 MCG tablet TAKE 1 TABLET BY MOUTH EVERY DAY 90 tablet 0  . losartan (COZAAR) 100 MG tablet TAKE 1 TABLET BY MOUTH EVERY DAY 30 tablet 0  . VITAMIN D, CHOLECALCIFEROL, PO Take 2,000 Int'l Units by mouth daily.      No current facility-administered medications on file prior to visit.     BP 122/86   Temp 97.6 F (36.4 C) (Temporal)   Ht 5' 7.5" (1.715 m)   Wt 192 lb (87.1 kg)   LMP 03/04/2011   BMI 29.63 kg/m       Objective:   Physical Exam Vitals signs and nursing note reviewed.  Constitutional:      Appearance: Normal appearance. She is overweight.  HENT:     Head: Normocephalic and atraumatic.     Right Ear: Tympanic membrane, ear canal and external ear normal. There is no impacted cerumen.     Left Ear: Tympanic membrane, ear canal and external ear normal. There is no impacted cerumen.     Nose: Nose normal. No congestion or rhinorrhea.     Mouth/Throat:     Mouth: Mucous membranes are moist.     Pharynx: Oropharynx is clear.  Eyes:     Extraocular Movements: Extraocular movements intact.     Pupils: Pupils are equal, round, and reactive to light.  Neck:     Musculoskeletal: Normal range of motion and neck supple.  Cardiovascular:     Rate and Rhythm: Normal rate and regular rhythm.     Pulses: Normal pulses.     Heart sounds: Normal heart sounds. No murmur. No friction rub. No gallop.    Pulmonary:     Effort: Pulmonary effort is normal. No respiratory distress.     Breath sounds: Normal breath sounds. No stridor. No wheezing, rhonchi or rales.  Chest:     Chest wall: No tenderness.  Abdominal:     General: Abdomen is flat. Bowel sounds are normal. There is no distension.     Palpations: Abdomen  is soft. There is no mass.     Tenderness: There is no abdominal tenderness. There is no right CVA tenderness, left CVA tenderness, guarding or rebound.     Hernia: No hernia is present.  Musculoskeletal: Normal range of motion.        General: No swelling, tenderness, deformity or signs of injury.     Right lower leg: No edema.  Skin:    General: Skin is warm and dry.     Capillary Refill: Capillary refill takes less than 2 seconds.     Coloration: Skin is not jaundiced or pale.     Findings: No bruising, erythema, lesion or rash.  Neurological:     General: No focal deficit present.     Mental Status: She is alert and oriented to person, place, and time. Mental status is at baseline.     Cranial Nerves: No cranial nerve deficit.     Sensory: No sensory deficit.     Motor: No weakness.     Coordination: Coordination normal.     Gait: Gait normal.     Deep Tendon Reflexes: Reflexes normal.  Psychiatric:        Mood and Affect: Mood normal.        Behavior: Behavior normal.        Thought Content: Thought content normal.        Judgment: Judgment normal.       Assessment & Plan:  1. Routine general medical examination at a health care facility -Continue to work on lifestyle modifications to help lose weight. -Follow-up in 1 year or sooner if needed - CBC with Differential/Platelet - Comprehensive metabolic panel - Lipid panel - TSH - Hemoglobin A1c  2. Hypothyroidism, unspecified type -Consider dose change of Synthroid - CBC with Differential/Platelet - Comprehensive metabolic panel - Lipid panel - TSH - Hemoglobin A1c  3. Essential hypertension -No  change in medication - CBC with Differential/Platelet - Comprehensive metabolic panel - Lipid panel - TSH - Hemoglobin A1c  4. Sleep disturbance -We will trial her on trazodone as needed.  Vies follow-up if medication does not help her with insomnia - traZODone (DESYREL) 50 MG tablet; Take 0.5-1 tablets (25-50 mg total) by mouth at bedtime as needed for sleep.  Dispense: 90 tablet; Refill: 1  5. Need for prophylactic vaccination and inoculation against influenza  - Flu Vaccine QUAD 6+ mos PF IM (Fluarix Quad PF)  6. Need for tetanus booster  - Tdap vaccine greater than or equal to 7yo IM  Dorothyann Peng, NP

## 2018-10-07 NOTE — Telephone Encounter (Signed)
Sent to the pharmacy by e-scribe. 

## 2018-10-07 NOTE — Telephone Encounter (Signed)
Updated patient on her labs  

## 2019-04-01 ENCOUNTER — Other Ambulatory Visit: Payer: Self-pay | Admitting: Adult Health

## 2019-04-01 DIAGNOSIS — G479 Sleep disorder, unspecified: Secondary | ICD-10-CM

## 2019-04-04 NOTE — Telephone Encounter (Signed)
Sent to the pharmacy by e-scribe. 

## 2019-05-19 ENCOUNTER — Other Ambulatory Visit: Payer: Self-pay

## 2019-05-20 ENCOUNTER — Encounter: Payer: Self-pay | Admitting: Obstetrics and Gynecology

## 2019-05-20 ENCOUNTER — Ambulatory Visit (INDEPENDENT_AMBULATORY_CARE_PROVIDER_SITE_OTHER): Payer: 59 | Admitting: Obstetrics and Gynecology

## 2019-05-20 VITALS — BP 118/78 | Ht 68.0 in | Wt 192.0 lb

## 2019-05-20 DIAGNOSIS — Z01419 Encounter for gynecological examination (general) (routine) without abnormal findings: Secondary | ICD-10-CM | POA: Diagnosis not present

## 2019-05-20 DIAGNOSIS — Z13820 Encounter for screening for osteoporosis: Secondary | ICD-10-CM

## 2019-05-20 DIAGNOSIS — Z124 Encounter for screening for malignant neoplasm of cervix: Secondary | ICD-10-CM | POA: Diagnosis not present

## 2019-05-20 NOTE — Patient Instructions (Signed)
We will plan to do the DEXA/bone density scan this year.  Continue weight bearing exercise, and vitamin D/calcium intake.

## 2019-05-20 NOTE — Progress Notes (Signed)
   [place] December 16, 1955 [id number]  SUBJECTIVE:  64 y.o. G0P0 female for annual routine gynecologic exam and Pap smear. Last seen in this office in 2014 for PMB workup.  She has no gynecologic concerns.  Current Outpatient Medications  Medication Sig Dispense Refill  . calcium carbonate (OS-CAL) 600 MG TABS Take 600 mg by mouth daily.    Marland Kitchen levothyroxine (SYNTHROID) 50 MCG tablet Take 1 tablet (50 mcg total) by mouth daily. 90 tablet 3  . losartan (COZAAR) 100 MG tablet TAKE 1 TABLET BY MOUTH EVERY DAY 90 tablet 3  . traZODone (DESYREL) 50 MG tablet TAKE 0.5-1 TABLETS (25-50 MG TOTAL) BY MOUTH AT BEDTIME AS NEEDED FOR SLEEP. 90 tablet 1  . VITAMIN D, CHOLECALCIFEROL, PO Take 2,000 Int'l Units by mouth daily.      No current facility-administered medications for this visit.   Allergies: Ramipril  Patient's last menstrual period was 03/04/2011.  Past medical history,surgical history, problem list, medications, allergies, family history and social history were all reviewed and documented as reviewed in the EPIC chart.  ROS:  Feeling well. No dyspnea or chest pain on exertion.  No abdominal pain, change in bowel habits, black or bloody stools.  No urinary tract symptoms. GYN ROS: no abnormal bleeding, pelvic pain or discharge, no breast pain or new or enlarging lumps on self exam. No neurological complaints.   OBJECTIVE:  BP 118/78   Ht 5\' 8"  (1.727 m)   Wt 192 lb (87.1 kg)   LMP 03/04/2011   BMI 29.19 kg/m  The patient appears well, alert, oriented x 3, in no distress. ENT normal.  Neck supple. No cervical or supraclavicular adenopathy or thyromegaly.  Lungs are clear, good air entry, no wheezes, rhonchi or rales. S1 and S2 normal, no murmurs, regular rate and rhythm.  Abdomen soft without tenderness, guarding, mass or organomegaly.  Neurological is normal, no focal findings.  BREAST EXAM: breasts appear normal, no suspicious masses, no skin or nipple changes or axillary  nodes  PELVIC EXAM: VULVA: normal appearing vulva with no masses, tenderness or lesions, atrophic changes, VAGINA: normal appearing vagina with normal color and discharge, no lesions, CERVIX: normal appearing cervix without discharge or lesions, UTERUS: uterus is normal size, shape, consistency and nontender, ADNEXA: normal adnexa in size, nontender and no masses,PAP: Pap smear done today, thin-prep method  Chaperone: Caryn Bee present during the examination  ASSESSMENT:  64 y.o. G0P0 here for annual gynecologic exam  PLAN:   1. Postmenopausal.  No hot flashes or night sweats. No vaginal bleeding. 2. Pap smear 2017.  No significant history of abnormal Pap smears.  A Pap smear is collected today. 3. Mammogram 09/2018.  Normal breast exam today.  She will continue with annual mammograms. 4. Colonoscopy 2012.  Recommended that she follow up at the recommended interval.   5. DEXA never.  We discussed the recommendation to screen for osteoporosis so she plans to schedule this year.  Scan is ordered.  6. Health maintenance.  No labs today as she normally has these completed with her primary care provider.    Return annually or sooner, prn.  Joseph Pierini MD 05/20/19

## 2019-05-20 NOTE — Addendum Note (Signed)
Addended by: Nelva Nay on: 05/20/2019 10:38 AM   Modules accepted: Orders

## 2019-05-24 LAB — PAP IG W/ RFLX HPV ASCU

## 2019-09-26 LAB — HM MAMMOGRAPHY

## 2019-09-28 ENCOUNTER — Encounter: Payer: Self-pay | Admitting: Adult Health

## 2019-10-02 ENCOUNTER — Other Ambulatory Visit: Payer: Self-pay | Admitting: Adult Health

## 2019-10-02 DIAGNOSIS — G479 Sleep disorder, unspecified: Secondary | ICD-10-CM

## 2019-10-05 ENCOUNTER — Other Ambulatory Visit: Payer: Self-pay | Admitting: Adult Health

## 2019-10-05 DIAGNOSIS — I159 Secondary hypertension, unspecified: Secondary | ICD-10-CM

## 2019-10-05 DIAGNOSIS — E039 Hypothyroidism, unspecified: Secondary | ICD-10-CM

## 2019-10-28 ENCOUNTER — Other Ambulatory Visit: Payer: Self-pay | Admitting: Adult Health

## 2019-10-28 DIAGNOSIS — I159 Secondary hypertension, unspecified: Secondary | ICD-10-CM

## 2019-10-28 DIAGNOSIS — E039 Hypothyroidism, unspecified: Secondary | ICD-10-CM

## 2019-11-01 MED ORDER — LEVOTHYROXINE SODIUM 50 MCG PO TABS: 50.0000 ug | ORAL_TABLET | Freq: Every day | ORAL | 0 refills | Status: DC

## 2019-11-01 MED ORDER — LOSARTAN POTASSIUM 100 MG PO TABS: 100.0000 mg | ORAL_TABLET | Freq: Every day | ORAL | 0 refills | Status: DC

## 2019-11-01 NOTE — Addendum Note (Signed)
Addended by: Karle Barr on: 11/01/2019 08:37 AM   Modules accepted: Orders

## 2019-11-01 NOTE — Telephone Encounter (Signed)
Pharmacy request a 90 day supply for insurance.   Erx has been sent. Appointment must be kept for additional refills.

## 2019-12-16 ENCOUNTER — Ambulatory Visit (INDEPENDENT_AMBULATORY_CARE_PROVIDER_SITE_OTHER): Payer: 59 | Admitting: Adult Health

## 2019-12-16 ENCOUNTER — Other Ambulatory Visit: Payer: Self-pay

## 2019-12-16 ENCOUNTER — Encounter: Payer: Self-pay | Admitting: Adult Health

## 2019-12-16 VITALS — BP 122/80 | HR 82 | Temp 98.5°F | Ht 67.0 in | Wt 195.2 lb

## 2019-12-16 DIAGNOSIS — Z23 Encounter for immunization: Secondary | ICD-10-CM | POA: Diagnosis not present

## 2019-12-16 DIAGNOSIS — G479 Sleep disorder, unspecified: Secondary | ICD-10-CM

## 2019-12-16 DIAGNOSIS — Z0000 Encounter for general adult medical examination without abnormal findings: Secondary | ICD-10-CM | POA: Diagnosis not present

## 2019-12-16 DIAGNOSIS — I10 Essential (primary) hypertension: Secondary | ICD-10-CM | POA: Diagnosis not present

## 2019-12-16 DIAGNOSIS — E039 Hypothyroidism, unspecified: Secondary | ICD-10-CM

## 2019-12-16 NOTE — Progress Notes (Signed)
Subjective:    Patient ID: Jordan Gregory, female    DOB: [birth year], 64 y.o.   MRN: [id number]  HPI Patient presents for yearly preventative medicine examination. She is a pleasant 64 year old female who  has a past medical history of Hypertension, Insomnia, Thyroid disease, and Vitamin D deficiency.  Hypothyroidism - takes Synthroid 50 mcg every morning.  Denies symptoms of hypo or hyperthyroidism Lab Results  Component Value Date   TSH 2.68 10/07/2018   Hypertension-is well controlled with Cozaar 100 mg.  She denies dizziness, lightheadedness, chest pain, shortness of breath, or syncopal episodes BP Readings from Last 3 Encounters:  12/16/19 122/80  05/20/19 118/78  10/07/18 122/86   Insomnia - Takes Trazodone QHS PRN- does get restful sleep with 50 mg dose.   All immunizations and health maintenance protocols were reviewed with the patient and needed orders were placed. She is due for prevnar 13 and first shingles vaccination   Appropriate screening laboratory values were ordered for the patient including screening of hyperlipidemia, renal function and hepatic function.   Medication reconciliation,  past medical history, social history, problem list and allergies were reviewed in detail with the patient  Goals were established with regard to weight loss, exercise, and  diet in compliance with medications  Wt Readings from Last 3 Encounters:  12/16/19 195 lb 3.2 oz (88.5 kg)  05/20/19 192 lb (87.1 kg)  10/07/18 192 lb (87.1 kg)    She is up-to-date on routine colon cancer screening as well as routine mammograms and GYN care  No acute issues    Review of Systems  Constitutional: Negative.   HENT: Negative.   Eyes: Negative.   Respiratory: Negative.   Cardiovascular: Negative.   Gastrointestinal: Negative.   Endocrine: Negative.   Genitourinary: Negative.   Musculoskeletal: Negative.   Skin: Negative.   Allergic/Immunologic: Negative.   Neurological:  Negative.   Hematological: Negative.   Psychiatric/Behavioral: Negative.    Past Medical History:  Diagnosis Date  . Hypertension   . Insomnia   . Thyroid disease   . Vitamin D deficiency     Social History   Socioeconomic History  . Marital status: Married    Spouse name: Not on file  . Number of children: Not on file  . Years of education: Not on file  . Highest education level: Not on file  Occupational History  . Not on file  Tobacco Use  . Smoking status: Never Smoker  . Smokeless tobacco: Never Used  Vaping Use  . Vaping Use: Never used  Substance and Sexual Activity  . Alcohol use: Yes    Alcohol/week: 14.0 standard drinks    Types: 14 Standard drinks or equivalent per week    Comment: social  . Drug use: No  . Sexual activity: Yes    Birth control/protection: Post-menopausal    Comment: 1st intercourse 76 yo-5 partners  Other Topics Concern  . Not on file  Social History Narrative   She works at [employer] and [employer].    Married - 23 years    No kids       She likes to travel, read, cycle, be outside.    Social Determinants of Health   Financial Resource Strain: Not on file  Food Insecurity: Not on file  Transportation Needs: Not on file  Physical Activity: Not on file  Stress: Not on file  Social Connections: Not on file  Intimate Partner Violence: Not on file    Past Surgical  History:  Procedure Laterality Date  . MOHS SURGERY  2016    Family History  Problem Relation Age of Onset  . Arthritis Mother   . Hypertension Mother   . Dementia Mother   . Heart disease Father   . Cancer Father        Liver    Allergies  Allergen Reactions  . Ramipril Cough    Current Outpatient Medications on File Prior to Visit  Medication Sig Dispense Refill  . calcium carbonate (OS-CAL) 600 MG TABS Take 600 mg by mouth daily.    . levothyroxine (SYNTHROID) 50 MCG tablet Take 1 tablet (50 mcg total) by mouth daily. 90 tablet 0  . losartan (COZAAR) 100 MG  tablet Take 1 tablet (100 mg total) by mouth daily. 90 tablet 0  . traZODone (DESYREL) 50 MG tablet TAKE 0.5-1 TABLETS (25-50 MG TOTAL) BY MOUTH AT BEDTIME AS NEEDED FOR SLEEP. 90 tablet 1  . VITAMIN D, CHOLECALCIFEROL, PO Take 2,000 Int'l Units by mouth daily.     No current facility-administered medications on file prior to visit.    BP 122/80   Pulse 82   Temp 98.5 F (36.9 C) (Oral)   Ht 5' 7" (1.702 m)   Wt 195 lb 3.2 oz (88.5 kg)   LMP 03/04/2011   SpO2 98%   BMI 30.57 kg/m       Objective:   Physical Exam Vitals and nursing note reviewed.  Constitutional:      General: She is not in acute distress.    Appearance: Normal appearance. She is well-developed. She is not ill-appearing.  HENT:     Head: Normocephalic and atraumatic.     Right Ear: Tympanic membrane, ear canal and external ear normal. There is no impacted cerumen.     Left Ear: Tympanic membrane, ear canal and external ear normal. There is no impacted cerumen.     Nose: Nose normal. No congestion or rhinorrhea.     Mouth/Throat:     Mouth: Mucous membranes are moist.     Pharynx: Oropharynx is clear. No oropharyngeal exudate or posterior oropharyngeal erythema.  Eyes:     General:        Right eye: No discharge.        Left eye: No discharge.     Extraocular Movements: Extraocular movements intact.     Conjunctiva/sclera: Conjunctivae normal.     Pupils: Pupils are equal, round, and reactive to light.  Neck:     Thyroid: No thyromegaly.     Vascular: No carotid bruit.     Trachea: No tracheal deviation.  Cardiovascular:     Rate and Rhythm: Normal rate and regular rhythm.     Pulses: Normal pulses.     Heart sounds: Normal heart sounds. No murmur heard. No friction rub. No gallop.   Pulmonary:     Effort: Pulmonary effort is normal. No respiratory distress.     Breath sounds: Normal breath sounds. No stridor. No wheezing, rhonchi or rales.  Chest:     Chest wall: No tenderness.  Abdominal:      General: Abdomen is flat. Bowel sounds are normal. There is no distension.     Palpations: Abdomen is soft. There is no mass.     Tenderness: There is no abdominal tenderness. There is no right CVA tenderness, left CVA tenderness, guarding or rebound.     Hernia: No hernia is present.  Musculoskeletal:        General: No swelling,   tenderness, deformity or signs of injury. Normal range of motion.     Cervical back: Normal range of motion and neck supple.     Right lower leg: No edema.     Left lower leg: No edema.  Lymphadenopathy:     Cervical: No cervical adenopathy.  Skin:    General: Skin is warm and dry.     Coloration: Skin is not jaundiced or pale.     Findings: No bruising, erythema, lesion or rash.  Neurological:     General: No focal deficit present.     Mental Status: She is alert and oriented to person, place, and time.     Cranial Nerves: No cranial nerve deficit.     Sensory: No sensory deficit.     Motor: No weakness.     Coordination: Coordination normal.     Gait: Gait normal.     Deep Tendon Reflexes: Reflexes normal.  Psychiatric:        Mood and Affect: Mood normal.        Behavior: Behavior normal.        Thought Content: Thought content normal.        Judgment: Judgment normal.       Assessment & Plan:  1. Routine general medical examination at a health care facility - Continue with lifestyle modifications  - Follow up in one year or sooner if needed - CMP with eGFR([facility]); Future - CBC with Differential/Platelet; Future - Lipid panel; Future - TSH; Future  2. Essential hypertension - BP well controlled. Continue Cozaar 100 mg  - CMP with eGFR([facility]); Future - CBC with Differential/Platelet; Future - Lipid panel; Future - TSH; Future  3. Hypothyroidism, unspecified type - Consider dose change of synthroid  - CMP with eGFR([facility]); Future - CBC with Differential/Platelet; Future - Lipid panel; Future - TSH; Future  4. Sleep disturbance -  Continue with Trazodone 50 mg QHS PRN   5. Need for shingles vaccine  - Varicella-zoster vaccine IM (Shingrix)  6. Need for pneumococcal vaccination  - Pneumococcal conjugate vaccine 13-valent IM  Dorothyann Peng, NP

## 2019-12-16 NOTE — Patient Instructions (Addendum)
It was great seeing you today!   We will follow up with you regarding your blood work   Please schedule your second shingles vaccination in about 6 weeks   I will see you back in one year or sooner if needed

## 2019-12-17 LAB — LIPID PANEL
Cholesterol: 186 mg/dL (ref <200)
HDL: 63 mg/dL (ref >=50)
LDL Cholesterol (Calc): 106 mg/dL (calc) — ABNORMAL HIGH
Non-HDL Cholesterol (Calc): 123 mg/dL (calc) (ref <130)
Total CHOL/HDL Ratio: 3 (calc) (ref <5.0)
Triglycerides: 83 mg/dL (ref <150)

## 2019-12-17 LAB — COMPLETE METABOLIC PANEL WITH GFR
AG Ratio: 1.9 (calc) (ref 1.0–2.5)
ALT: 28 U/L (ref 6–29)
AST: 22 U/L (ref 10–35)
Albumin: 4.5 g/dL (ref 3.6–5.1)
Alkaline phosphatase (APISO): 68 U/L (ref 37–153)
BUN: 21 mg/dL (ref 7–25)
CO2: 27 mmol/L (ref 20–32)
Calcium: 9.4 mg/dL (ref 8.6–10.4)
Chloride: 107 mmol/L (ref 98–110)
Creat: 0.84 mg/dL (ref 0.50–0.99)
GFR, Est African American: 85 mL/min/{1.73_m2} (ref >=60)
GFR, Est Non African American: 73 mL/min/{1.73_m2} (ref >=60)
Globulin: 2.4 g/dL (calc) (ref 1.9–3.7)
Glucose, Bld: 109 mg/dL — ABNORMAL HIGH (ref 65–99)
Potassium: 4.8 mmol/L (ref 3.5–5.3)
Sodium: 141 mmol/L (ref 135–146)
Total Bilirubin: 0.7 mg/dL (ref 0.2–1.2)
Total Protein: 6.9 g/dL (ref 6.1–8.1)

## 2019-12-17 LAB — CBC WITH DIFFERENTIAL/PLATELET
Absolute Monocytes: 314 cells/uL (ref 200–950)
Basophils Absolute: 42 cells/uL (ref 0–200)
Basophils Relative: 1.3 %
Eosinophils Absolute: 112 cells/uL (ref 15–500)
Eosinophils Relative: 3.5 %
HCT: 38 % (ref 35.0–45.0)
Hemoglobin: 13 g/dL (ref 11.7–15.5)
Lymphs Abs: 1072 cells/uL (ref 850–3900)
MCH: 30.2 pg (ref 27.0–33.0)
MCHC: 34.2 g/dL (ref 32.0–36.0)
MCV: 88.4 fL (ref 80.0–100.0)
MPV: 10.1 fL (ref 7.5–12.5)
Monocytes Relative: 9.8 %
Neutro Abs: 1661 cells/uL (ref 1500–7800)
Neutrophils Relative %: 51.9 %
Platelets: 186 10*3/uL (ref 140–400)
RBC: 4.3 10*6/uL (ref 3.80–5.10)
RDW: 13 % (ref 11.0–15.0)
Total Lymphocyte: 33.5 %
WBC: 3.2 10*3/uL — ABNORMAL LOW (ref 3.8–10.8)

## 2019-12-17 LAB — TSH: TSH: 4.81 mIU/L — ABNORMAL HIGH (ref 0.40–4.50)

## 2019-12-19 ENCOUNTER — Other Ambulatory Visit: Payer: Self-pay | Admitting: Adult Health

## 2019-12-19 DIAGNOSIS — E039 Hypothyroidism, unspecified: Secondary | ICD-10-CM

## 2019-12-19 MED ORDER — LEVOTHYROXINE SODIUM 75 MCG PO TABS: 75.0000 ug | ORAL_TABLET | Freq: Every day | ORAL | 1 refills | Status: DC

## 2020-01-13 ENCOUNTER — Other Ambulatory Visit: Payer: Self-pay | Admitting: Adult Health

## 2020-01-13 NOTE — Telephone Encounter (Signed)
Last refilled on 12/19/2019 for 2 months.  Request is too early.

## 2020-01-24 ENCOUNTER — Other Ambulatory Visit: Payer: Self-pay

## 2020-01-25 ENCOUNTER — Telehealth: Payer: Self-pay | Admitting: Adult Health

## 2020-01-25 NOTE — Telephone Encounter (Signed)
Patient is calling and requesting a refill for levothyroxine (SYNTHROID) 75 MCG tablet sent to [id number] - [place], [address], [place] [address]  Phone:  [phone] Fax:  [phone]  CB is [phone]

## 2020-01-25 NOTE — Telephone Encounter (Signed)
Spoke to the pt and informed her that the new levothyroxine was sent to the pharmacy on 12/19/19 for 2 months.  Pt took it for the first full 30 days.  She didn't realize that there was a refill so she resorted back to the old dose.  She is going to pick up the other refill today.  How long does she need to take it before she can have her lab work?

## 2020-01-25 NOTE — Telephone Encounter (Signed)
I have scheduled the pt for Feb 9th and informed her to start medication.  Nothing further needed.

## 2020-01-25 NOTE — Telephone Encounter (Signed)
Have her follow up in 3 weeks for recheck

## 2020-01-26 ENCOUNTER — Other Ambulatory Visit: Payer: Self-pay | Admitting: Adult Health

## 2020-01-26 DIAGNOSIS — I159 Secondary hypertension, unspecified: Secondary | ICD-10-CM

## 2020-01-26 DIAGNOSIS — E039 Hypothyroidism, unspecified: Secondary | ICD-10-CM

## 2020-01-26 NOTE — Telephone Encounter (Signed)
Losartan sent to the pharmacy by e-scribe.  Levothyroxine dosage has been changed.  Nothing further needed.

## 2020-02-10 NOTE — Addendum Note (Signed)
Addended by: Marrion Coy on: 02/10/2020 03:54 PM   Modules accepted: Orders

## 2020-02-15 ENCOUNTER — Other Ambulatory Visit (INDEPENDENT_AMBULATORY_CARE_PROVIDER_SITE_OTHER): Payer: 59

## 2020-02-15 ENCOUNTER — Other Ambulatory Visit: Payer: Self-pay

## 2020-02-15 DIAGNOSIS — E039 Hypothyroidism, unspecified: Secondary | ICD-10-CM | POA: Diagnosis not present

## 2020-02-15 LAB — TSH: TSH: 1.79 u[IU]/mL (ref 0.35–4.50)

## 2020-02-16 ENCOUNTER — Other Ambulatory Visit: Payer: Self-pay | Admitting: Adult Health

## 2020-02-16 MED ORDER — LEVOTHYROXINE SODIUM 75 MCG PO TABS: 75.0000 ug | ORAL_TABLET | Freq: Every day | ORAL | 3 refills | Status: DC

## 2020-03-13 ENCOUNTER — Other Ambulatory Visit: Payer: Self-pay

## 2020-03-13 ENCOUNTER — Ambulatory Visit (INDEPENDENT_AMBULATORY_CARE_PROVIDER_SITE_OTHER): Payer: 59 | Admitting: Adult Health

## 2020-03-13 DIAGNOSIS — Z23 Encounter for immunization: Secondary | ICD-10-CM

## 2020-03-27 ENCOUNTER — Other Ambulatory Visit: Payer: Self-pay | Admitting: Adult Health

## 2020-03-27 DIAGNOSIS — G479 Sleep disorder, unspecified: Secondary | ICD-10-CM

## 2020-04-24 ENCOUNTER — Ambulatory Visit (INDEPENDENT_AMBULATORY_CARE_PROVIDER_SITE_OTHER): Payer: 59 | Admitting: Adult Health

## 2020-04-24 ENCOUNTER — Ambulatory Visit (INDEPENDENT_AMBULATORY_CARE_PROVIDER_SITE_OTHER): Payer: 59

## 2020-04-24 ENCOUNTER — Encounter: Payer: Self-pay | Admitting: Adult Health

## 2020-04-24 ENCOUNTER — Other Ambulatory Visit: Payer: Self-pay

## 2020-04-24 VITALS — BP 112/82 | HR 75 | Temp 97.6°F | Ht 67.0 in | Wt 195.6 lb

## 2020-04-24 DIAGNOSIS — M898X1 Other specified disorders of bone, shoulder: Secondary | ICD-10-CM | POA: Diagnosis not present

## 2020-04-24 IMAGING — DX DG CLAVICLE*R*
2 series · 2 of 2 positions shown · non-contrast
Comparison: None.

CLINICAL DATA: Right clavicle pain and swelling, edema for 1 week

EXAM:
RIGHT CLAVICLE - 2+ VIEWS

[clavicle ap]
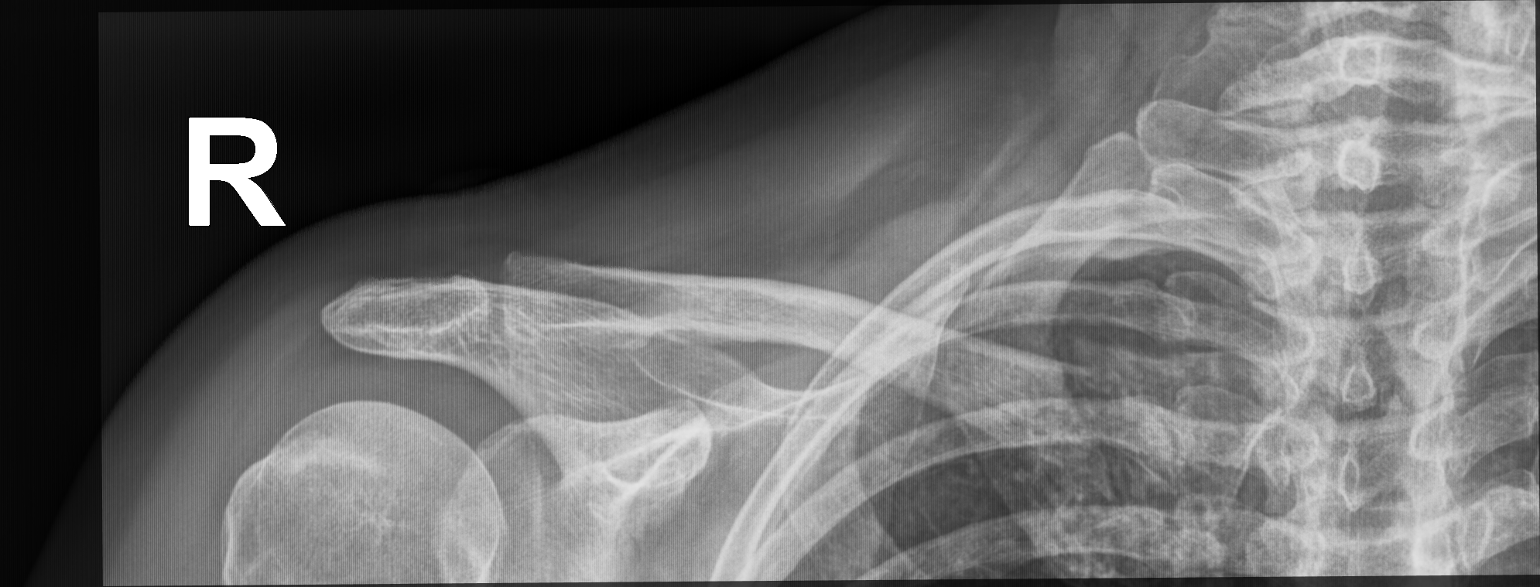

[clavicle tangential]
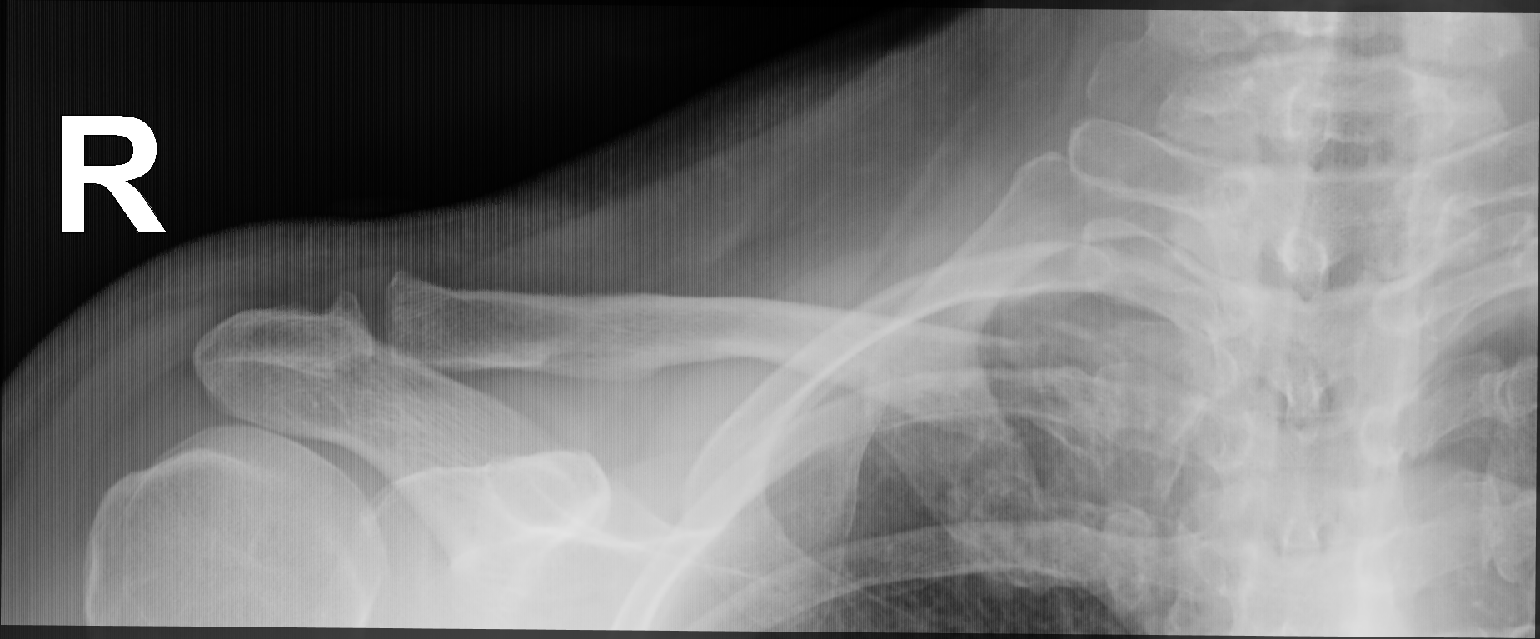

[2 of 2 positions shown; findings below may reference images not displayed]

FINDINGS: Frontal and lordotic views of the right clavicle are obtained. There
are no acute displaced fractures. Mild hypertrophic changes of the
acromioclavicular joint. Visualized portions of the lungs are clear.
IMPRESSION: 1. Mild right acromioclavicular joint osteoarthritis. No acute bony
abnormality.

## 2020-04-24 NOTE — Progress Notes (Signed)
Subjective:    Patient ID: Jordan Gregory, female    DOB: 11/17/1955, 65 y.o.   MRN: [id number]  HPI 65 year old female who  has a past medical history of Hypertension, Insomnia, Thyroid disease, and Vitamin D deficiency.  She presents to the office today for right clavcle l pain. She reports that she was over extending to hammer a nail into the wall and felt a sudden pain to her right clavicle. This happened about one week ago. Since then she has developed swelling over the left clavicle and has some mild limited range of motion to the left shoulder.  She does report some radiating pain across her shoulder and up her right side of her neck.  She has been taking Advil at home which helps with the pain.    Review of Systems See HPI   Past Medical History:  Diagnosis Date  . Hypertension   . Insomnia   . Thyroid disease   . Vitamin D deficiency     Social History   Socioeconomic History  . Marital status: Married    Spouse name: Not on file  . Number of children: Not on file  . Years of education: Not on file  . Highest education level: Not on file  Occupational History  . Not on file  Tobacco Use  . Smoking status: Never Smoker  . Smokeless tobacco: Never Used  Vaping Use  . Vaping Use: Never used  Substance and Sexual Activity  . Alcohol use: Yes    Alcohol/week: 14.0 standard drinks    Types: 14 Standard drinks or equivalent per week    Comment: social  . Drug use: No  . Sexual activity: Yes    Birth control/protection: Post-menopausal    Comment: 1st intercourse 11 yo-5 partners  Other Topics Concern  . Not on file  Social History Narrative   She works at [employer] and [employer].    Married - 23 years    No kids       She likes to travel, read, cycle, be outside.    Social Determinants of Health   Financial Resource Strain: Not on file  Food Insecurity: Not on file  Transportation Needs: Not on file  Physical Activity: Not on file  Stress: Not on  file  Social Connections: Not on file  Intimate Partner Violence: Not on file    Past Surgical History:  Procedure Laterality Date  . MOHS SURGERY  2016    Family History  Problem Relation Age of Onset  . Arthritis Mother   . Hypertension Mother   . Dementia Mother   . Heart disease Father   . Cancer Father        Liver    Allergies  Allergen Reactions  . Ramipril Cough    Current Outpatient Medications on File Prior to Visit  Medication Sig Dispense Refill  . calcium carbonate (OS-CAL) 600 MG TABS Take 600 mg by mouth daily.    Marland Kitchen levothyroxine (SYNTHROID) 75 MCG tablet Take 1 tablet (75 mcg total) by mouth daily before breakfast. 90 tablet 3  . losartan (COZAAR) 100 MG tablet TAKE 1 TABLET BY MOUTH EVERY DAY 90 tablet 3  . traZODone (DESYREL) 50 MG tablet TAKE 0.5-1 TABLETS (25-50 MG TOTAL) BY MOUTH AT BEDTIME AS NEEDED FOR SLEEP. 90 tablet 1  . VITAMIN D, CHOLECALCIFEROL, PO Take 2,000 Int'l Units by mouth daily.     No current facility-administered medications on file prior to visit.  BP 112/82 (BP Location: Left Arm, Patient Position: Sitting, Cuff Size: Large)   Pulse 75   Temp 97.6 F (36.4 C) (Oral)   Ht 5\' 7"  (1.702 m)   Wt 195 lb 9.6 oz (88.7 kg)   LMP 03/04/2011   SpO2 98%   BMI 30.64 kg/m       Objective:   Physical Exam Vitals and nursing note reviewed.  Constitutional:      Appearance: Normal appearance.  Cardiovascular:     Rate and Rhythm: Normal rate and regular rhythm.     Pulses: Normal pulses.     Heart sounds: Normal heart sounds.  Pulmonary:     Effort: Pulmonary effort is normal.     Breath sounds: Normal breath sounds.  Musculoskeletal:        General: Swelling and tenderness present.     Comments: She does have tenderness with palpation throughout the right clavicle as well as some soft tissue swelling.  Has loss of range of motion when bringing her arm above her head or trying to scratch her back.  Skin:    General: Skin is  warm and dry.     Capillary Refill: Capillary refill takes less than 2 seconds.  Neurological:     General: No focal deficit present.     Mental Status: She is alert and oriented to person, place, and time.  Psychiatric:        Mood and Affect: Mood normal.        Behavior: Behavior normal.        Thought Content: Thought content normal.        Judgment: Judgment normal.       Assessment & Plan:  1. Pain of right clavicle - DG Clavicle Right; Future - Will likely need [place] t/o soft tissue injury  - Continue with NSAIDS - Rest - Ice  Dorothyann Peng, NP

## 2020-09-20 ENCOUNTER — Other Ambulatory Visit: Payer: Self-pay

## 2020-09-21 ENCOUNTER — Encounter: Payer: Self-pay | Admitting: Adult Health

## 2020-09-21 ENCOUNTER — Ambulatory Visit (INDEPENDENT_AMBULATORY_CARE_PROVIDER_SITE_OTHER): Payer: 59 | Admitting: Adult Health

## 2020-09-21 VITALS — BP 110/80 | HR 88 | Temp 98.1°F | Ht 67.0 in | Wt 183.0 lb

## 2020-09-21 DIAGNOSIS — R110 Nausea: Secondary | ICD-10-CM | POA: Diagnosis not present

## 2020-09-21 DIAGNOSIS — T733XXA Exhaustion due to excessive exertion, initial encounter: Secondary | ICD-10-CM

## 2020-09-21 DIAGNOSIS — M958 Other specified acquired deformities of musculoskeletal system: Secondary | ICD-10-CM

## 2020-09-21 DIAGNOSIS — G8929 Other chronic pain: Secondary | ICD-10-CM

## 2020-09-21 DIAGNOSIS — M5441 Lumbago with sciatica, right side: Secondary | ICD-10-CM

## 2020-09-21 MED ORDER — METHYLPREDNISOLONE 4 MG PO TBPK: ORAL_TABLET | ORAL | 0 refills | Status: DC

## 2020-09-21 NOTE — Progress Notes (Signed)
Subjective:    Patient ID: Jordan Gregory, female    DOB: September 15, 1955, 65 y.o.   MRN: [id number]  HPI 65 year old female who  has a past medical history of Hypertension, Insomnia, Thyroid disease, and Vitamin D deficiency.  She presents to the office today for multiple issues   She was seen about 5 months ago for right clavicle pain.  At this time she reported overextending her shoulder to hammer a nail into the wall and felt a sudden pain in her right clavicle.  X-ray did not show any fracture or dislocation.  Once the swelling went down she had a more noticeable "knot" on the clavicle closer to her sternum.  This knot has stayed and possibly become a little larger.  Is not really painful.  Has no issues with range of motion of the right arm. She has been experiencing a degree of low back pain with radiating pain/weakness down the right leg.  This happened to her many years ago and she went through physical therapy which helped resolve the symptoms.  Currently the symptoms have been present for roughly a year and a half but have been having progressively worse.  She was out in [place] in [place] recently and noticed that she was having trouble hiking due to the weakness in her right leg and has also noticed that is hard for her to climb steps without feeling off balance.  This pain seems to be intermittent.  Nothing seems to help or make the pain worse. While she was in [place] she started to develop extensive fatigue, lightheadedness, and some mild nausea with episodic dry heaves and loose stool.  She originally thought it was due to elevation and when she got back to [place] she thought it would go away.  Unfortunately it has not and she continues to have symptoms intermittently.  In the last 10 days since she is returned she also reports feeling a single episode of a "twinge" in the area of her heart.  She did not have any left-sided chest pain at this time.   Review of Systems See  HPI   Past Medical History:  Diagnosis Date   Hypertension    Insomnia    Thyroid disease    Vitamin D deficiency     Social History   Socioeconomic History   Marital status: Married    Spouse name: Not on file   Number of children: Not on file   Years of education: Not on file   Highest education level: Not on file  Occupational History   Not on file  Tobacco Use   Smoking status: Never   Smokeless tobacco: Never  Vaping Use   Vaping Use: Never used  Substance and Sexual Activity   Alcohol use: Yes    Alcohol/week: 14.0 standard drinks    Types: 14 Standard drinks or equivalent per week    Comment: social   Drug use: No   Sexual activity: Yes    Birth control/protection: Post-menopausal    Comment: 1st intercourse 82 yo-5 partners  Other Topics Concern   Not on file  Social History Narrative   She works at [employer] and [employer].    Married - 23 years    No kids       She likes to travel, read, cycle, be outside.    Social Determinants of Health   Financial Resource Strain: Not on file  Food Insecurity: Not on file  Transportation Needs: Not on  file  Physical Activity: Not on file  Stress: Not on file  Social Connections: Not on file  Intimate Partner Violence: Not on file    Past Surgical History:  Procedure Laterality Date   MOHS SURGERY  2016    Family History  Problem Relation Age of Onset   Arthritis Mother    Hypertension Mother    Dementia Mother    Heart disease Father    Cancer Father        Liver    Allergies  Allergen Reactions   Ramipril Cough    Current Outpatient Medications on File Prior to Visit  Medication Sig Dispense Refill   calcium carbonate (OS-CAL) 600 MG TABS Take 600 mg by mouth daily.     levothyroxine (SYNTHROID) 75 MCG tablet Take 1 tablet (75 mcg total) by mouth daily before breakfast. 90 tablet 3   losartan (COZAAR) 100 MG tablet TAKE 1 TABLET BY MOUTH EVERY DAY 90 tablet 3   VITAMIN D, CHOLECALCIFEROL, PO Take  2,000 Int'l Units by mouth daily.     traZODone (DESYREL) 50 MG tablet TAKE 0.5-1 TABLETS (25-50 MG TOTAL) BY MOUTH AT BEDTIME AS NEEDED FOR SLEEP. 90 tablet 1   No current facility-administered medications on file prior to visit.    BP 110/80   Pulse 88   Temp 98.1 F (36.7 C) (Oral)   Ht '5\' 7"'$  (1.702 m)   Wt 183 lb (83 kg)   LMP 03/04/2011   SpO2 97%   BMI 28.66 kg/m       Objective:   Physical Exam Vitals and nursing note reviewed.  Constitutional:      Appearance: Normal appearance.  Cardiovascular:     Rate and Rhythm: Normal rate and regular rhythm.     Pulses: Normal pulses.     Heart sounds: Normal heart sounds.  Pulmonary:     Effort: Pulmonary effort is normal.     Breath sounds: Normal breath sounds.  Abdominal:     General: Abdomen is flat. Bowel sounds are normal. There is no distension.     Palpations: Abdomen is soft. There is mass.     Tenderness: There is no abdominal tenderness. There is no rebound.  Musculoskeletal:        General: Deformity (quarter sized hard mass noted on right medial clavical) present. Normal range of motion.  Skin:    General: Skin is warm and dry.  Neurological:     General: No focal deficit present.     Mental Status: She is alert and oriented to person, place, and time.  Psychiatric:        Mood and Affect: Mood normal.        Behavior: Behavior normal.        Thought Content: Thought content normal.        Judgment: Judgment normal.      Assessment & Plan:  1. Deformity of clavicle  - MR STERNUM WO CONTRAST; Future  2. Fatigue due to excessive exertion, initial encounter - EKG 12-Lead- NSR, Rate 79.  - Not cardiac related.  -Unclear cause at this time.  Patient does not want further work-up, will see if symptoms resolve with time.  She knows to follow-up as needed  3. Chronic right-sided low back pain with right-sided sciatica  - methylPREDNISolone (MEDROL DOSEPAK) 4 MG TBPK tablet; Take as directed  Dispense:  21 tablet; Refill: 0 - MR Lumbar Spine Wo Contrast; Future  4. Nausea - refused zofran  -  Follow up as needed  Dorothyann Peng, NP

## 2020-09-23 ENCOUNTER — Other Ambulatory Visit: Payer: Self-pay | Admitting: Adult Health

## 2020-09-23 DIAGNOSIS — G479 Sleep disorder, unspecified: Secondary | ICD-10-CM

## 2020-10-01 LAB — HM MAMMOGRAPHY

## 2020-10-02 ENCOUNTER — Encounter: Payer: Self-pay | Admitting: Adult Health

## 2020-10-15 ENCOUNTER — Ambulatory Visit
Admission: RE | Admit: 2020-10-15 | Discharge: 2020-10-15 | Disposition: A | Discharge status: Home / Self Care | Payer: 59 | Source: Ambulatory Visit | Attending: Adult Health | Admitting: Adult Health

## 2020-10-15 ENCOUNTER — Other Ambulatory Visit: Payer: Self-pay

## 2020-10-15 DIAGNOSIS — M5441 Lumbago with sciatica, right side: Secondary | ICD-10-CM

## 2020-10-15 DIAGNOSIS — G8929 Other chronic pain: Secondary | ICD-10-CM

## 2020-10-15 DIAGNOSIS — M958 Other specified acquired deformities of musculoskeletal system: Secondary | ICD-10-CM

## 2020-10-15 IMAGING — MR MR LUMBAR SPINE W/O CM
4 of 5 series · 27 of 48 positions shown · non-contrast
Comparison: None.

CLINICAL DATA: Low back pain.

EXAM:
MRI LUMBAR SPINE WITHOUT CONTRAST
TECHNIQUE: Multiplanar, multisequence MR imaging of the lumbar spine was
performed. No intravenous contrast was administered.

[Series 3: T2 · sagittal · 4.0mm · 1.09mm/px · 6 of 15 slices shown (1 of 2)]
[im 1/15]
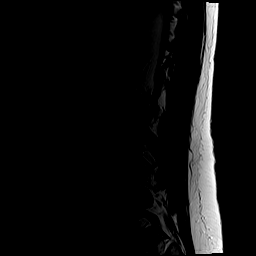
[im 3/15]
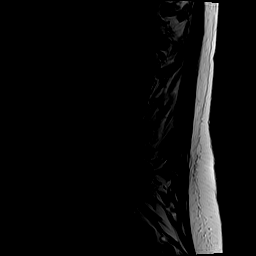
[im 6/15]
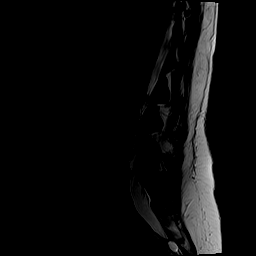
[im 9/15]
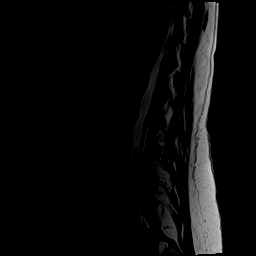
[im 12/15]
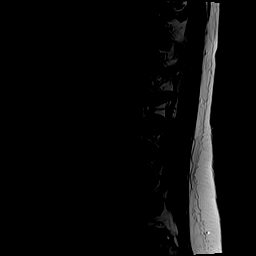
[im 15/15]
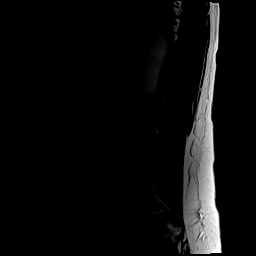

[Series 5: T1 · sagittal · 4.0mm · 1.09mm/px · 6 of 15 slices shown (1 of 2)]
[im 1/15]
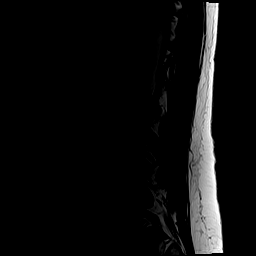
[im 3/15]
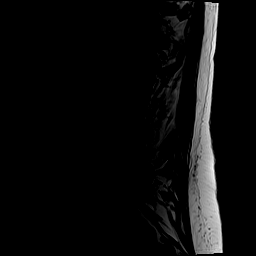
[im 6/15]
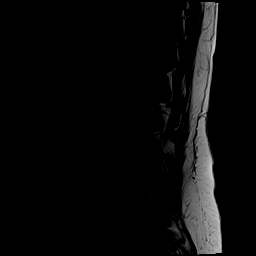
[im 9/15]
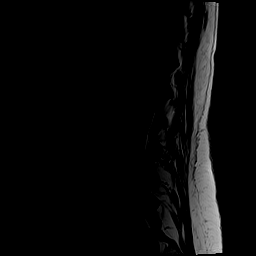
[im 12/15]
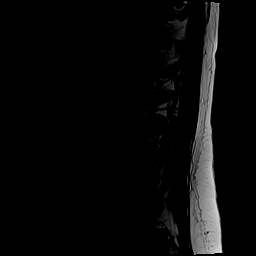
[im 15/15]
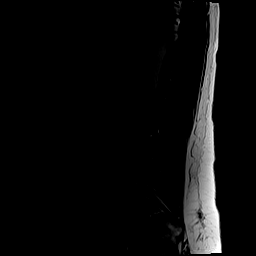

[Series 6: T2 · axial · 4.0mm · 0.39mm/px · z∈[-79,+132]mm · 9 of 41 slices shown (2 of 2)]
[im 1/41]
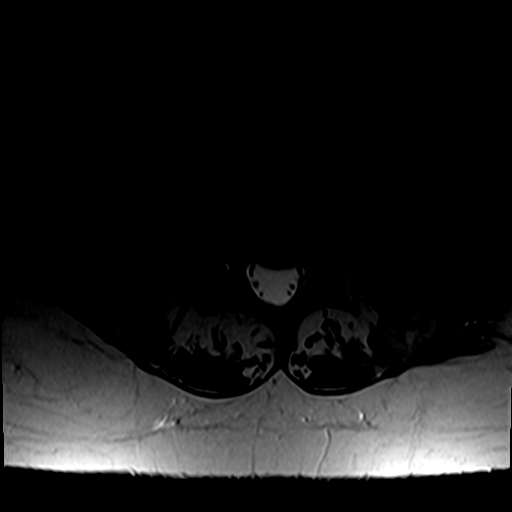
[im 6/41]
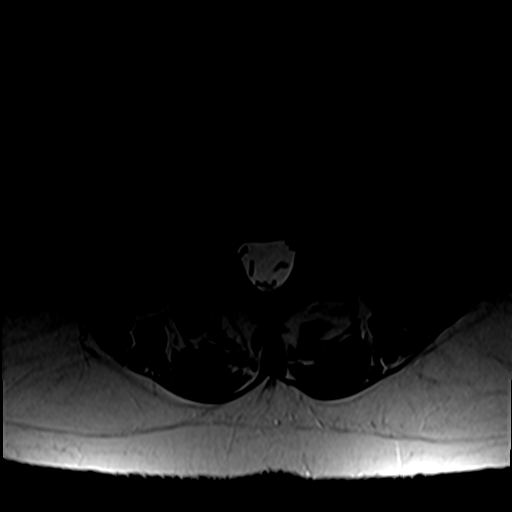
[im 12/41]
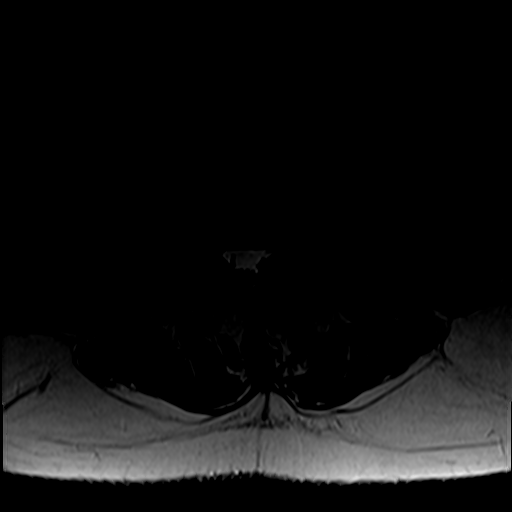
[im 18/41]
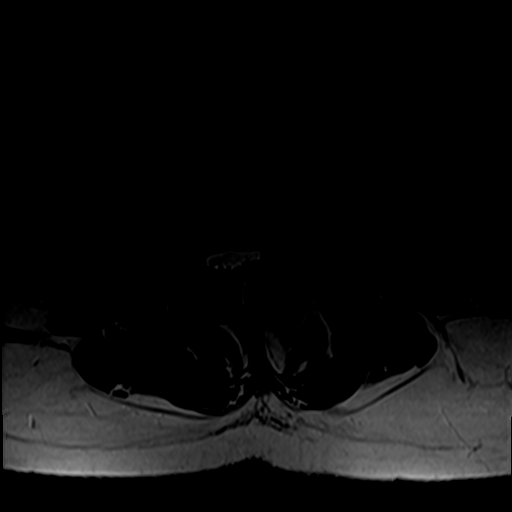
[im 21/41]
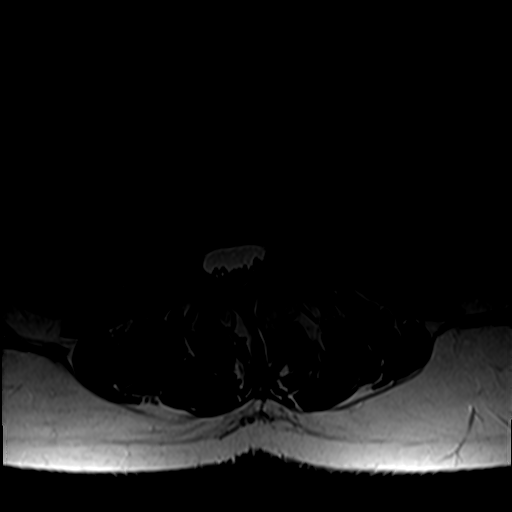
[im 23/41]
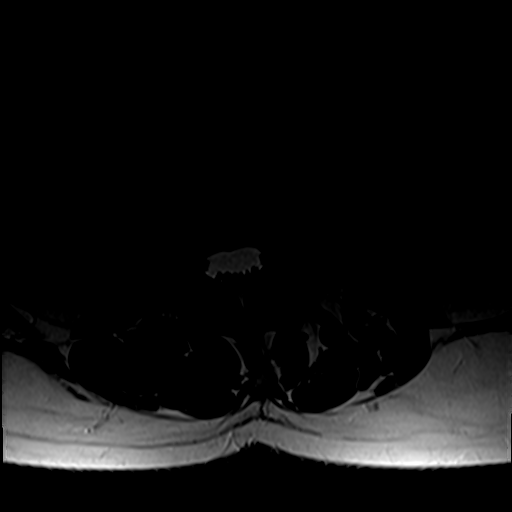
[im 29/41]
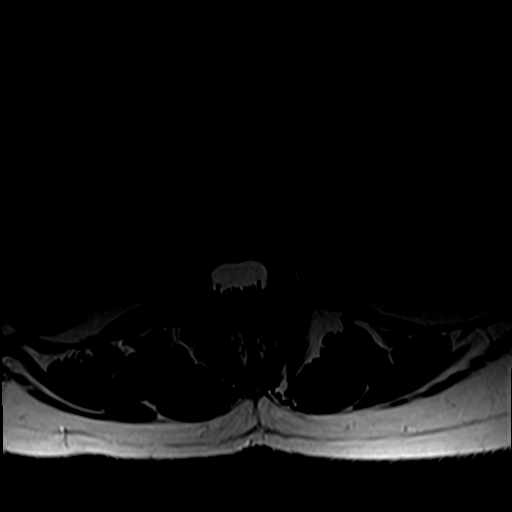
[im 35/41]
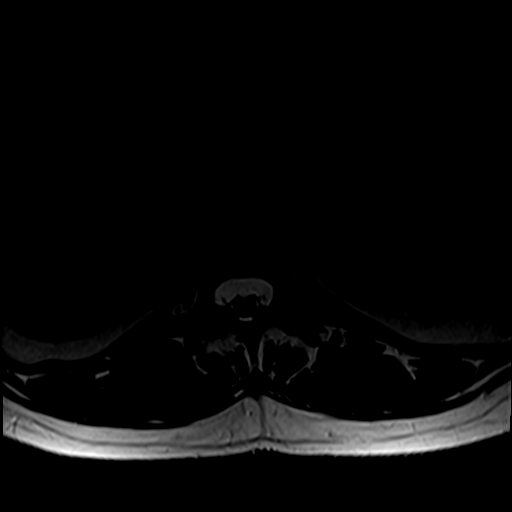
[im 41/41]
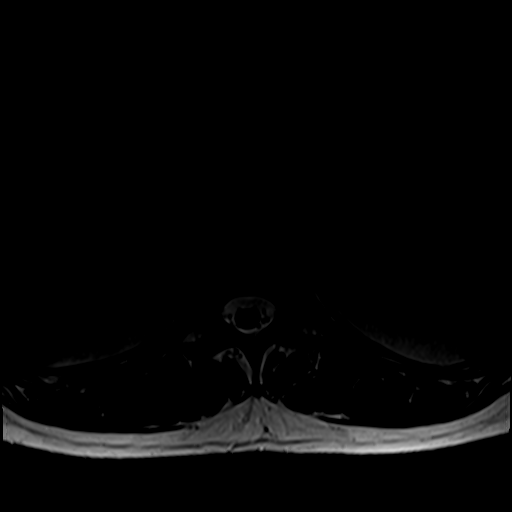

[Series 7: T1 · axial · 4.0mm · 0.39mm/px · z∈[-79,+103]mm · 6 of 41 slices shown (2 of 2)]
[im 1/41]
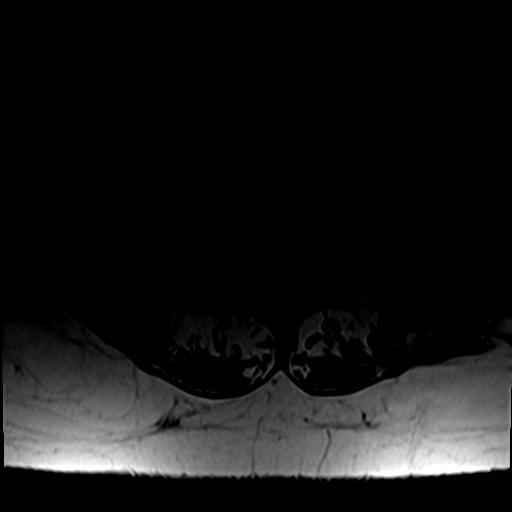
[im 6/41]
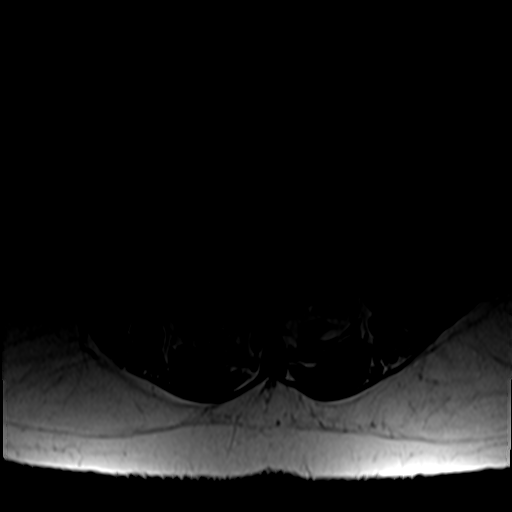
[im 12/41]
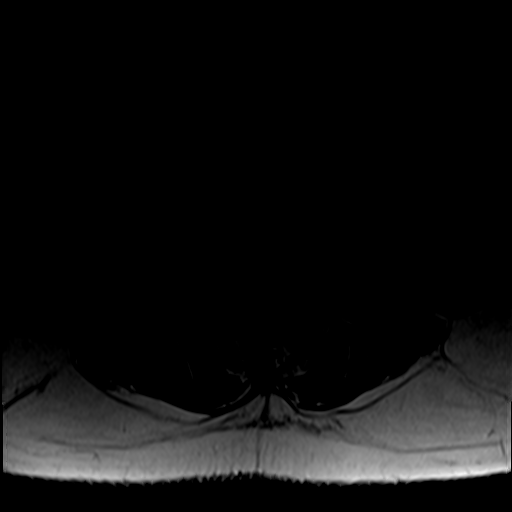
[im 18/41]
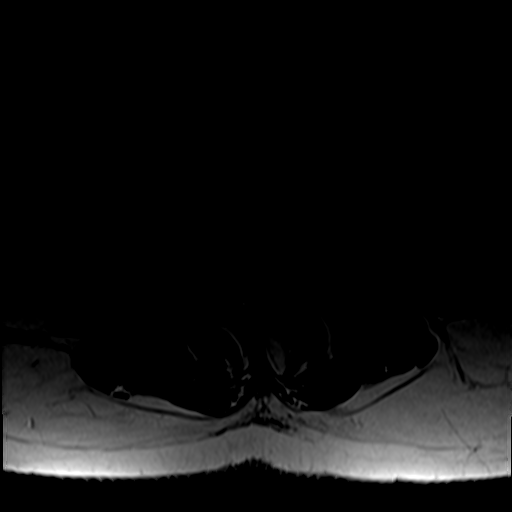
[im 21/41]
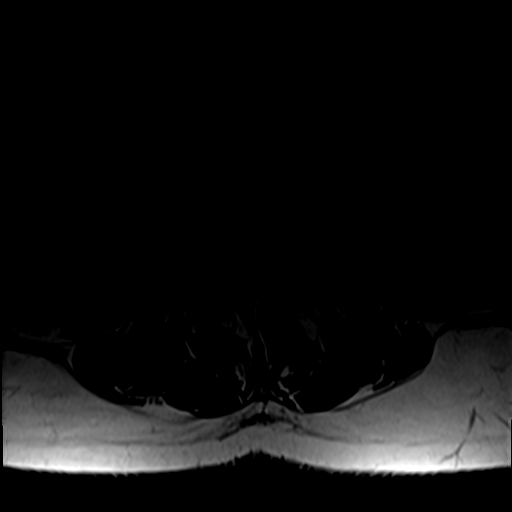
[im 35/41]
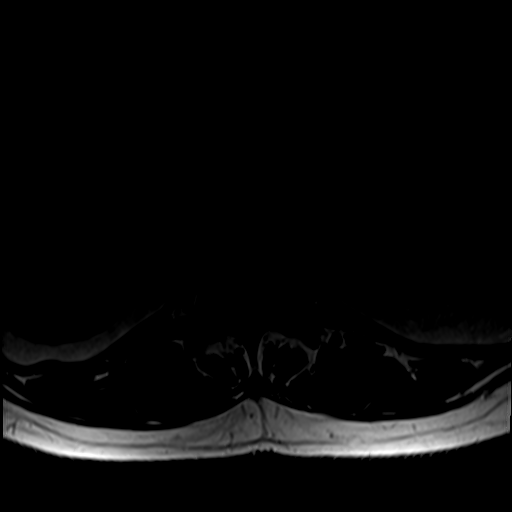

[27 of 48 positions shown; findings below may reference images not displayed]

FINDINGS: Segmentation:  Standard.

Alignment:  Dextroconvex scoliosis.

Vertebrae: No fracture, evidence of discitis, or bone lesion.
Schmorl node in the superior endplate of T12 with surrounding marrow
edema.

Conus medullaris and cauda equina: Conus extends to the L1-2 level.
Conus and cauda equina appear normal.

Paraspinal and other soft tissues: Negative.

Disc levels:

T12-L1: Tiny central disc protrusion. No spinal canal neural
foraminal stenosis.

L1-2: No spinal canal or neural foraminal stenosis.

L2-3: Mild loss of disc height, shallow disc bulge and mild facet
degenerative changes without significant spinal canal or neural
foraminal stenosis.

L3-4: Disc bulge with superimposed small central left central disc
protrusion and moderate facet degenerative changes, left greater
than right, resulting in TIGER of d narrowing of the left subarticular
zone. No significant neural foraminal narrowing.

L4-5: Disc bulge and moderate to advanced hypertrophic facet
degenerative changes with ligamentum flavum redundancy resulting in
mild narrowing of the left subarticular zone. No significant spinal
canal or neural foraminal stenosis.

L5-S1: Shallow disc bulge, moderate to advanced hypertrophic right
and mild-to-moderate left facet degenerative changes without
significant spinal canal or neural foraminal stenosis.
IMPRESSION: 1. Degenerative changes of the lumbar spine, more pronounced at the
level of the facet joints at L3-4, L4-5 and L5-S1 with mild
narrowing of the left subarticular zone at L3-4 and L4-5.
2. No high-grade spinal canal stenosis or neural foraminal stenosis
at any level.

## 2020-10-15 IMAGING — MR MR STERNUM W/O CM
6 series · 16 of 16 positions shown · non-contrast
Comparison: Radiographs [DATE]

CLINICAL DATA: Right clavicular mass

EXAM:
MR CHEST WITHOUT CONTRAST
TECHNIQUE: Multiplanar, multisequence MR imaging of the right upper chest with
attention to the right clavicle was performed. No intravenous
contrast was administered.

[Series 5: T1 · coronal · 3.3mm · 0.86mm/px · 3 of 24 slices shown (1 of 3)]
[im 1/24]
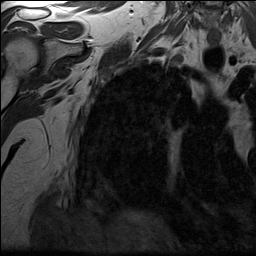
[im 12/24]
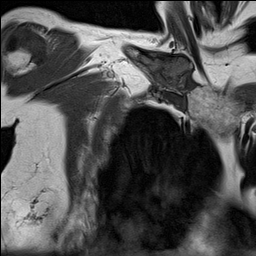
[im 24/24]
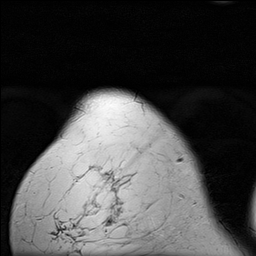

[Series 6: T2 fat-sat · sagittal · 4.0mm · 0.90mm/px · 3 of 36 slices shown (1 of 2)]
[im 1/36]
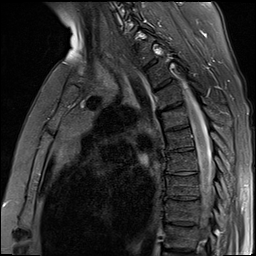
[im 18/36]
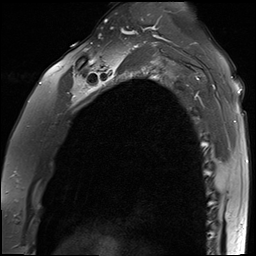
[im 36/36]
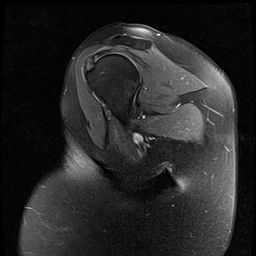

[Series 7: STIR · coronal · 3.3mm · 0.86mm/px · 2 of 24 slices shown]
[im 1/24]
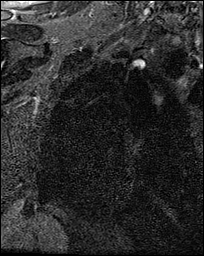
[im 24/24]
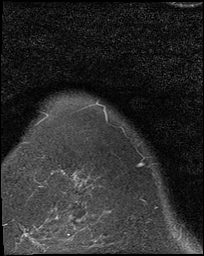

[Series 8: T1 · axial · 3.3mm · 0.86mm/px · z∈[-14,+61]mm · 2 of 24 slices shown (2 of 3)]
[im 1/24]
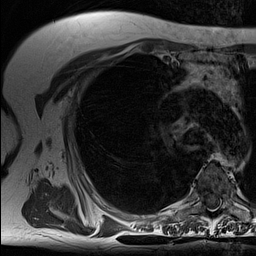
[im 24/24]
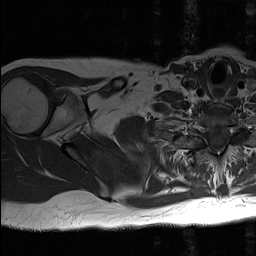

[Series 9: T2 fat-sat · axial · 3.3mm · 0.86mm/px · z∈[+45,+130]mm · 3 of 28 slices shown (2 of 2)]
[im 1/28]
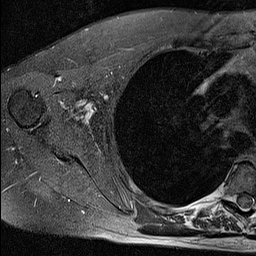
[im 14/28]
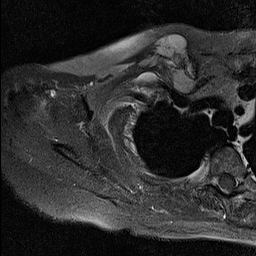
[im 28/28]
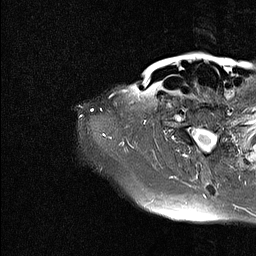

[Series 11: T1 · sagittal · 4.0mm · 0.90mm/px · 3 of 36 slices shown (3 of 3)]
[im 1/36]
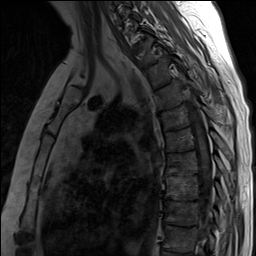
[im 18/36]
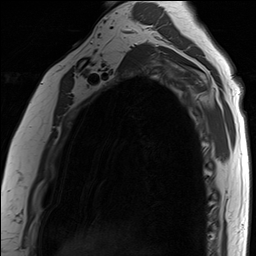
[im 36/36]
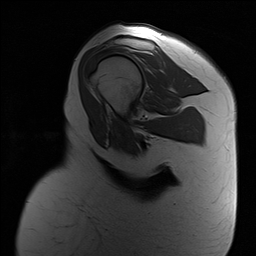

[16 of 16 positions shown; findings below may reference images not displayed]

FINDINGS: Bones/Joint/Cartilage:

Pathologic fracture the medial clavicle associated with a 5.1 by
by 4.1 cm (volume = 30 cm^3) mass of the medial clavicle with high
T2 and low T1 signal characteristics and lobular morphology of the
extraosseous components. Much of this mass is extraosseous and
extends both into the anterior upper mediastinum and along the upper
margin of the pectoralis muscle, and a component of the mass extends
longitudinally in the medullary space of the medial clavicle with
associated endosteal scalloping. No sternal involvement or overt
effusion of the sternoclavicular joint. No other regional bony
lesions are identified.

Ligaments:

Coracoclavicular ligament appears intact.

Muscles and Tendons:

As noted above, the clavicular mass bulges into the upper margin of
the pectoralis major muscle.

Soft tissue:

No additional significant soft tissue related findings.
IMPRESSION: 1. 30 cc mass of the right medial clavicle with bony destructive
findings an underlying pathologic fracture is well as extraosseous
extension, causing anterior bulging of the upper pectoralis major
muscle and slight posterior displacement of the right for subclavian
vein. This mass has high T2 and low T1 signal characteristics and
internal heterogeneity in septation raising concern for malignancy,
and also is component extending longitudinally in the medullary
space of the distal clavicle. Neoplastic etiology such as metastatic
disease or plasmacytoma favored over infection, although correlation
with the patient's clinical presentation and scenario is
recommended. Orthopedic tumor specialist referral for tissue
diagnosis is likely warranted.

## 2020-10-17 ENCOUNTER — Telehealth: Payer: Self-pay | Admitting: Adult Health

## 2020-10-17 DIAGNOSIS — D492 Neoplasm of unspecified behavior of bone, soft tissue, and skin: Secondary | ICD-10-CM

## 2020-10-17 DIAGNOSIS — G8929 Other chronic pain: Secondary | ICD-10-CM

## 2020-10-17 DIAGNOSIS — M545 Low back pain, unspecified: Secondary | ICD-10-CM

## 2020-10-17 NOTE — Telephone Encounter (Signed)
Updated patient on MRI results   Appropriate referrals placed

## 2020-10-18 ENCOUNTER — Telehealth: Payer: Self-pay | Admitting: Oncology

## 2020-10-18 NOTE — Telephone Encounter (Signed)
[facility] Scheduling Phone Note  I contacted [place] regarding a referral from Sallee Provencal, NP, [facility] for purpose of evaluation and work up of clavical mass.  Lauris Poag [facility] was aware of her referral and reason.  Information on reason for referral was not necessary.  Patient was informed about the Diagnostic Clinic and the intent being to complete a diagnostic work-up for her suspicious findings.  She is aware her appointment is with our Physician Assistant to identify a diagnostic plan of care and to arrange further oncologist or specialist care as indicated.  I confirmed with Duanne Guess she has transportation to her [facility] appointment.  Patient is aware to bring a list of medications, as well as insurance cards.  Visitor policy reviewed with patient as well, including what to expect on arrival to the [facility].  Thank you for the referral, and we look forward to [place] and completing her diagnostic work-up and arranging her subsequent appointment with our oncologist team.  Clinical referral information:  10/15/2020 MR STERNUM WO CONTRAST IMPRESSION: 1. 30 cc mass of the right medial clavicle with bony destructive findings an underlying pathologic fracture is well as extraosseous extension, causing anterior bulging of the upper pectoralis major muscle and slight posterior displacement of the right for subclavian vein. This mass has high T2 and low T1 signal characteristics and internal heterogeneity in septation raising concern for malignancy, and also is component extending longitudinally in the medullary space of the distal clavicle. Neoplastic etiology such as metastatic disease or plasmacytoma favored over infection, although correlation with the patient's clinical presentation and scenario is recommended. Orthopedic tumor specialist referral for  tissue diagnosis is likely warranted.

## 2020-10-23 ENCOUNTER — Inpatient Hospital Stay: Payer: 59

## 2020-10-23 ENCOUNTER — Encounter (HOSPITAL_COMMUNITY): Payer: Self-pay

## 2020-10-23 ENCOUNTER — Other Ambulatory Visit: Payer: Self-pay

## 2020-10-23 ENCOUNTER — Encounter: Payer: Self-pay | Admitting: Physician Assistant

## 2020-10-23 ENCOUNTER — Emergency Department (HOSPITAL_COMMUNITY): Payer: 59

## 2020-10-23 ENCOUNTER — Inpatient Hospital Stay: Payer: 59 | Attending: Physician Assistant | Admitting: Physician Assistant

## 2020-10-23 ENCOUNTER — Telehealth: Payer: Self-pay | Admitting: *Deleted

## 2020-10-23 ENCOUNTER — Emergency Department (HOSPITAL_COMMUNITY)
Admission: EM | Admit: 2020-10-23 | Discharge: 2020-10-23 | Disposition: A | Discharge status: Home / Self Care | Payer: 59 | Attending: Emergency Medicine | Admitting: Emergency Medicine

## 2020-10-23 VITALS — BP 172/78 | HR 105 | Temp 98.0°F | Resp 20 | Ht 67.0 in | Wt 184.3 lb

## 2020-10-23 DIAGNOSIS — G8929 Other chronic pain: Secondary | ICD-10-CM | POA: Diagnosis not present

## 2020-10-23 DIAGNOSIS — M899 Disorder of bone, unspecified: Secondary | ICD-10-CM | POA: Diagnosis not present

## 2020-10-23 DIAGNOSIS — Z808 Family history of malignant neoplasm of other organs or systems: Secondary | ICD-10-CM

## 2020-10-23 DIAGNOSIS — N179 Acute kidney failure, unspecified: Secondary | ICD-10-CM | POA: Insufficient documentation

## 2020-10-23 DIAGNOSIS — M89319 Hypertrophy of bone, unspecified shoulder: Secondary | ICD-10-CM

## 2020-10-23 DIAGNOSIS — R2231 Localized swelling, mass and lump, right upper limb: Secondary | ICD-10-CM | POA: Insufficient documentation

## 2020-10-23 DIAGNOSIS — E039 Hypothyroidism, unspecified: Secondary | ICD-10-CM

## 2020-10-23 DIAGNOSIS — R7989 Other specified abnormal findings of blood chemistry: Secondary | ICD-10-CM | POA: Insufficient documentation

## 2020-10-23 DIAGNOSIS — I10 Essential (primary) hypertension: Secondary | ICD-10-CM

## 2020-10-23 DIAGNOSIS — M8440XA Pathological fracture, unspecified site, initial encounter for fracture: Secondary | ICD-10-CM

## 2020-10-23 DIAGNOSIS — Z79899 Other long term (current) drug therapy: Secondary | ICD-10-CM | POA: Insufficient documentation

## 2020-10-23 DIAGNOSIS — M549 Dorsalgia, unspecified: Secondary | ICD-10-CM

## 2020-10-23 DIAGNOSIS — E559 Vitamin D deficiency, unspecified: Secondary | ICD-10-CM

## 2020-10-23 LAB — BASIC METABOLIC PANEL
Anion gap: 10 (ref 5–15)
BUN: 44 mg/dL — ABNORMAL HIGH (ref 8–23)
CO2: 22 mmol/L (ref 22–32)
Calcium: 9.7 mg/dL (ref 8.9–10.3)
Chloride: 106 mmol/L (ref 98–111)
Creatinine, Ser: 3.3 mg/dL — ABNORMAL HIGH (ref 0.44–1.00)
GFR, Estimated: 15 mL/min — ABNORMAL LOW (ref >60)
Glucose, Bld: 106 mg/dL — ABNORMAL HIGH (ref 70–99)
Potassium: 4.8 mmol/L (ref 3.5–5.1)
Sodium: 138 mmol/L (ref 135–145)

## 2020-10-23 LAB — URINALYSIS, ROUTINE W REFLEX MICROSCOPIC
Bilirubin Urine: NEGATIVE
Glucose, UA: NEGATIVE mg/dL
Ketones, ur: NEGATIVE mg/dL
Leukocytes,Ua: NEGATIVE
Nitrite: NEGATIVE
Protein, ur: NEGATIVE mg/dL
Specific Gravity, Urine: 1.011 (ref 1.005–1.030)
pH: 5 (ref 5.0–8.0)

## 2020-10-23 LAB — CMP (CANCER CENTER ONLY)
ALT: 12 U/L (ref 0–44)
AST: 16 U/L (ref 15–41)
Albumin: 4.1 g/dL (ref 3.5–5.0)
Alkaline Phosphatase: 74 U/L (ref 38–126)
Anion gap: 13 (ref 5–15)
BUN: 38 mg/dL — ABNORMAL HIGH (ref 8–23)
CO2: 21 mmol/L — ABNORMAL LOW (ref 22–32)
Calcium: 9.8 mg/dL (ref 8.9–10.3)
Chloride: 106 mmol/L (ref 98–111)
Creatinine: 3.49 mg/dL (ref 0.44–1.00)
GFR, Estimated: 14 mL/min — ABNORMAL LOW (ref >60)
Glucose, Bld: 141 mg/dL — ABNORMAL HIGH (ref 70–99)
Potassium: 4.3 mmol/L (ref 3.5–5.1)
Sodium: 140 mmol/L (ref 135–145)
Total Bilirubin: 0.4 mg/dL (ref 0.3–1.2)
Total Protein: 8.1 g/dL (ref 6.5–8.1)

## 2020-10-23 LAB — CBC WITH DIFFERENTIAL (CANCER CENTER ONLY)
Abs Immature Granulocytes: 0.01 10*3/uL (ref 0.00–0.07)
Basophils Absolute: 0 10*3/uL (ref 0.0–0.1)
Basophils Relative: 1 %
Eosinophils Absolute: 0.1 10*3/uL (ref 0.0–0.5)
Eosinophils Relative: 4 %
HCT: 30.3 % — ABNORMAL LOW (ref 36.0–46.0)
Hemoglobin: 10.1 g/dL — ABNORMAL LOW (ref 12.0–15.0)
Immature Granulocytes: 0 %
Lymphocytes Relative: 30 %
Lymphs Abs: 0.9 10*3/uL (ref 0.7–4.0)
MCH: 28.1 pg (ref 26.0–34.0)
MCHC: 33.3 g/dL (ref 30.0–36.0)
MCV: 84.2 fL (ref 80.0–100.0)
Monocytes Absolute: 0.3 10*3/uL (ref 0.1–1.0)
Monocytes Relative: 11 %
Neutro Abs: 1.6 10*3/uL — ABNORMAL LOW (ref 1.7–7.7)
Neutrophils Relative %: 54 %
Platelet Count: 197 10*3/uL (ref 150–400)
RBC: 3.6 MIL/uL — ABNORMAL LOW (ref 3.87–5.11)
RDW: 12.5 % (ref 11.5–15.5)
WBC Count: 3 10*3/uL — ABNORMAL LOW (ref 4.0–10.5)
nRBC: 0 % (ref 0.0–0.2)

## 2020-10-23 LAB — SEDIMENTATION RATE: Sed Rate: 48 mm/hr — ABNORMAL HIGH (ref 0–22)

## 2020-10-23 LAB — SODIUM, URINE, RANDOM: Sodium, Ur: 94 mmol/L

## 2020-10-23 LAB — CREATININE CLEARANCE, URINE, 24 HOUR
Collection Interval-CRCL: 24 hours
Creatinine Clearance: 1 mL/min — ABNORMAL LOW (ref 75–115)
Creatinine, 24H Ur: 29 mg/d — ABNORMAL LOW (ref 600–1800)
Creatinine, Urine: 57.52 mg/dL
Urine Total Volume-CRCL: 50 mL

## 2020-10-23 LAB — C-REACTIVE PROTEIN: CRP: 1.7 mg/dL — ABNORMAL HIGH (ref <1.0)

## 2020-10-23 LAB — LACTATE DEHYDROGENASE: LDH: 283 U/L — ABNORMAL HIGH (ref 98–192)

## 2020-10-23 IMAGING — US US RENAL
1 series · 15 of 25 positions shown · non-contrast
Comparison: None.

CLINICAL DATA: Acute renal injury

EXAM:
RENAL / URINARY TRACT ULTRASOUND COMPLETE

[Series 1: us renal mc & wl · 15 of 31 slices shown]
[im 1/31]
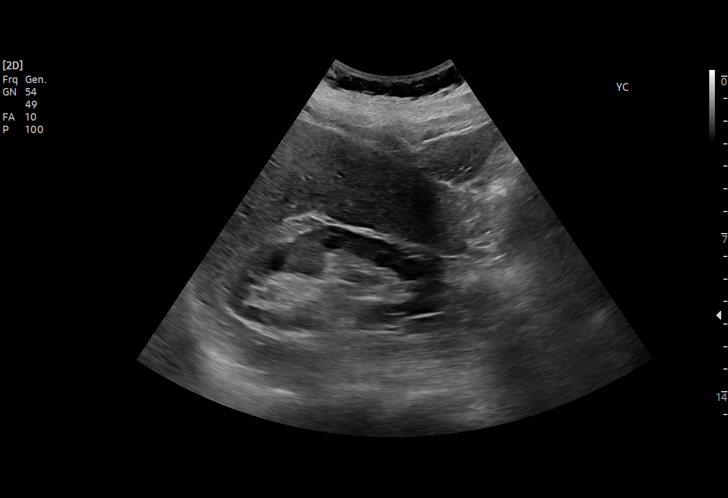
[im 3/31]
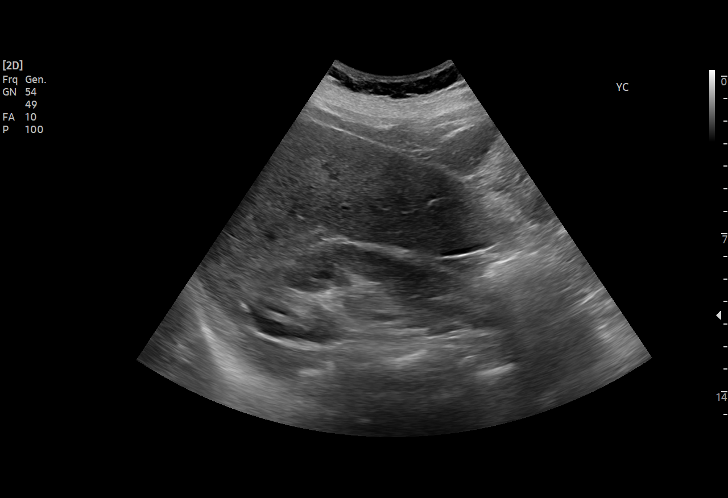
[im 6/31]
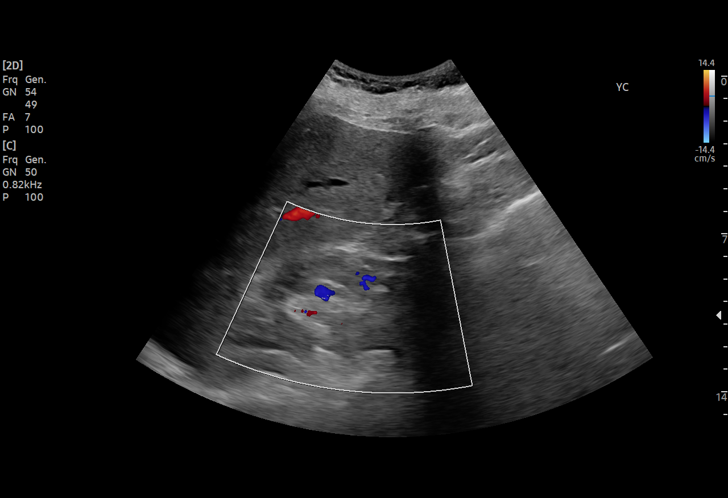
[im 7/31]
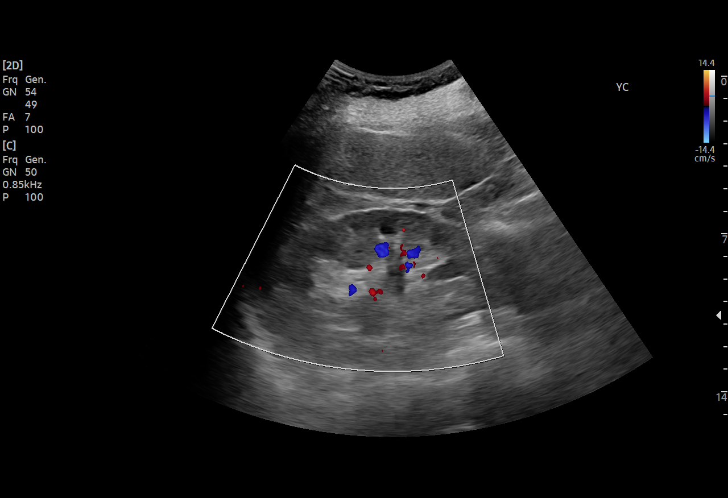
[im 9/31]
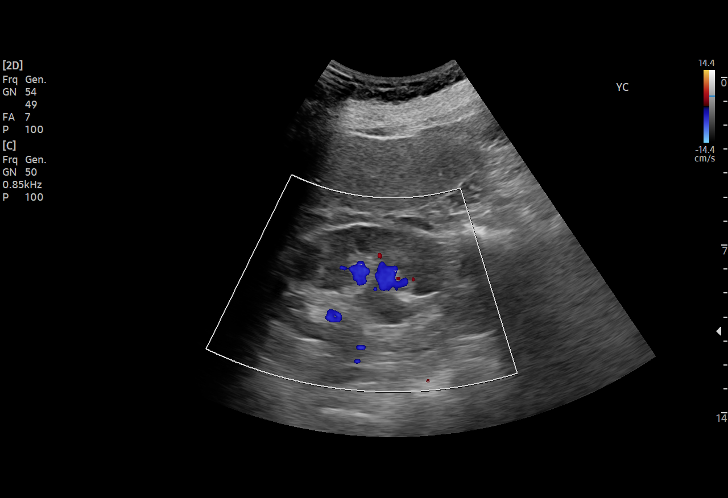
[im 12/31]
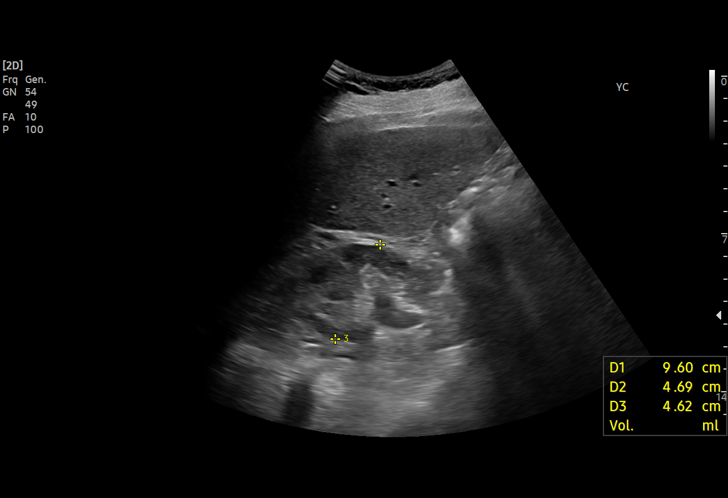
[im 13/31]
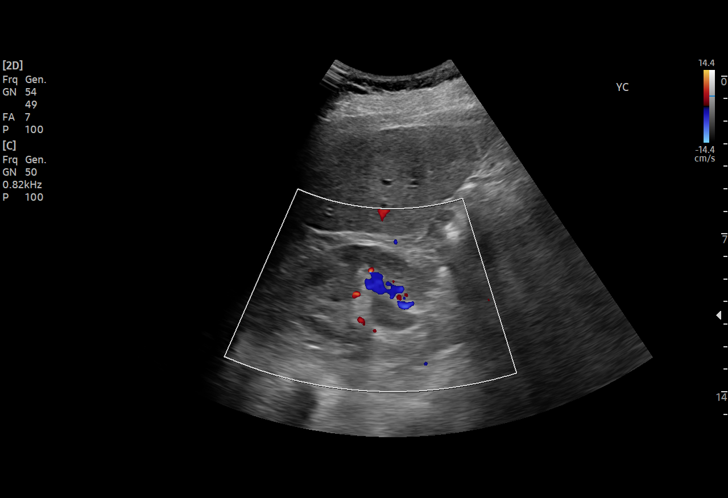
[im 16/31]
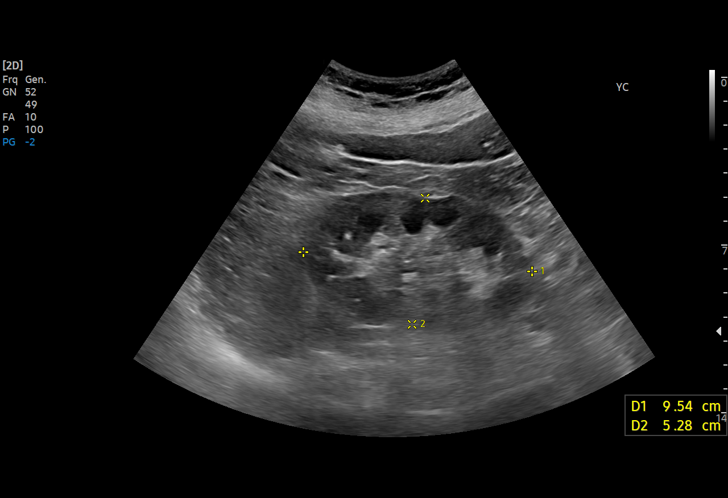
[im 18/31]
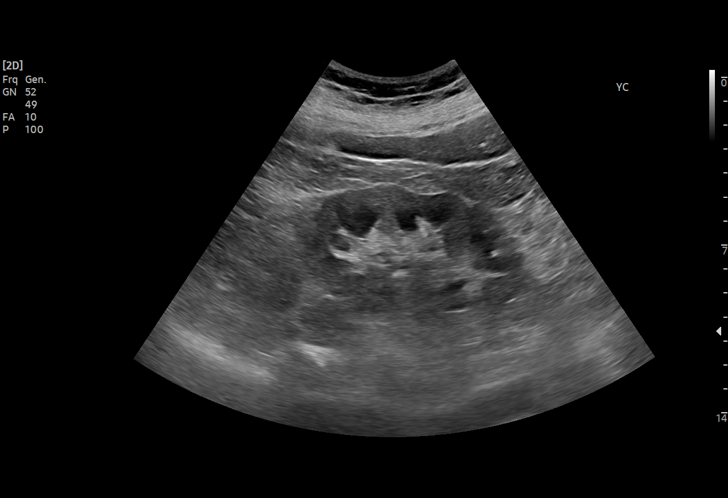
[im 19/31]
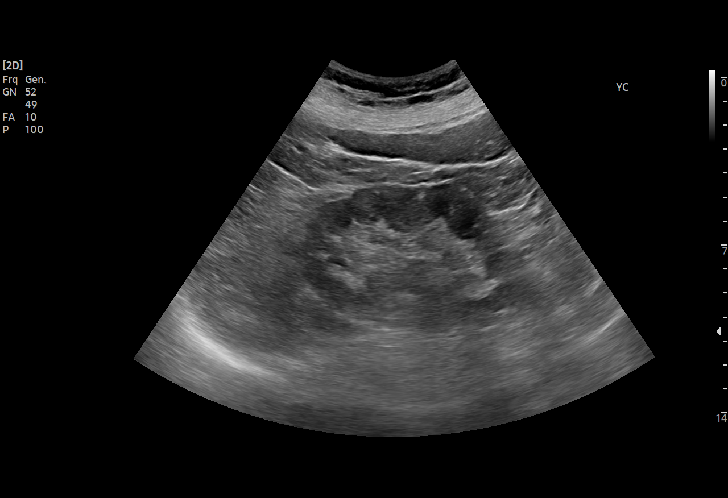
[im 22/31]
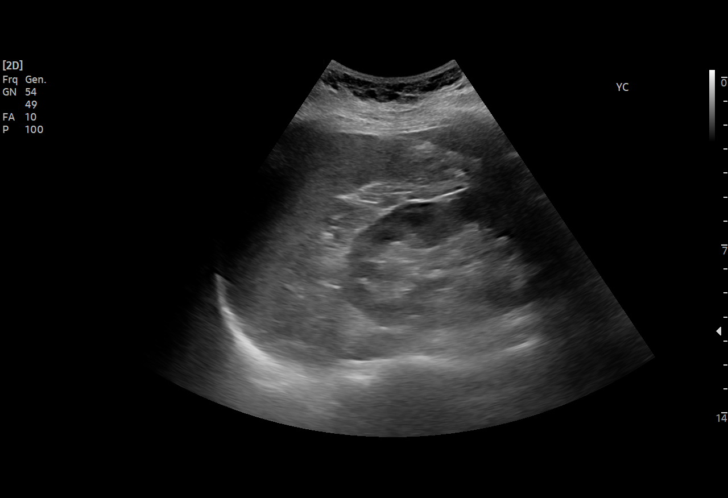
[im 24/31]
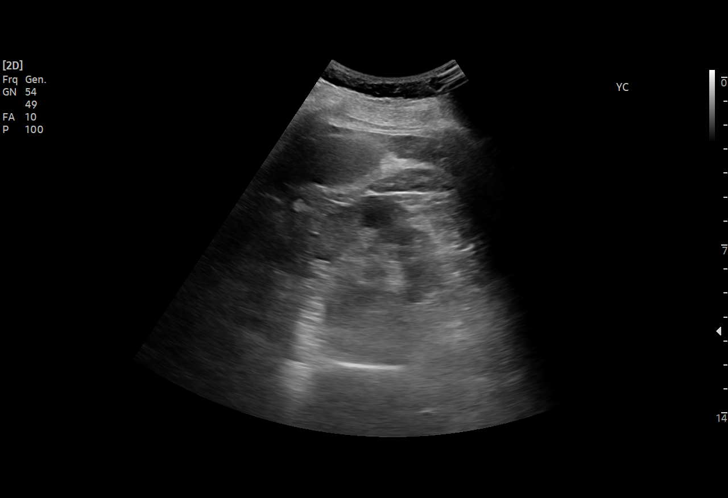
[im 26/31]
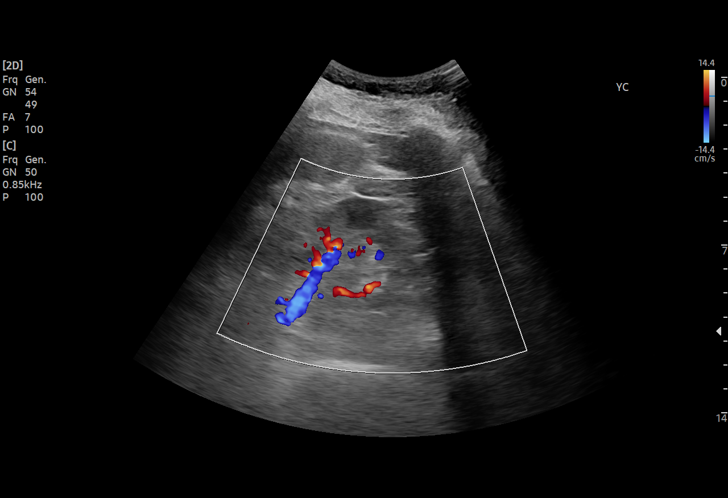
[im 28/31]
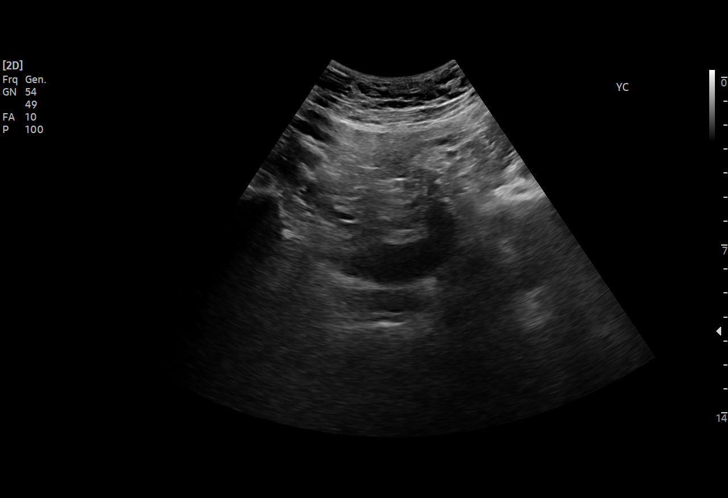
[im 31/31]
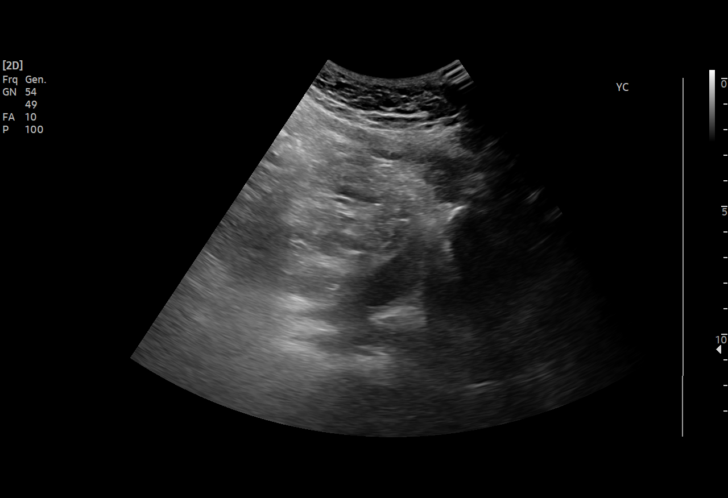

[15 of 25 positions shown; findings below may reference images not displayed]

FINDINGS: Right Kidney:

Renal measurements: 9.6 x 4.7 x 4.6 cm. = volume: 109 mL. No
hydronephrosis is noted. Mildly prominent extrarenal pelvis is seen.
No mass lesion is noted.

Left Kidney:

Renal measurements: 9.5 x 5.3 x 5.4 cm. = volume: 142 mL.
Echogenicity within normal limits. No mass or hydronephrosis
visualized.

Bladder:

Decompressed

Other:

None.
IMPRESSION: No acute abnormality is noted within the kidneys.

## 2020-10-23 MED ORDER — SODIUM CHLORIDE 0.9 % IV BOLUS
1000.0000 mL | Freq: Once | INTRAVENOUS | Status: AC
  Administered 2020-10-23: 1000 mL via INTRAVENOUS

## 2020-10-23 NOTE — ED Provider Notes (Signed)
[facility] DEPT Provider Note   CSN: [id number] Arrival date & time: 10/23/20  1703     History Chief Complaint  Patient presents with   Abnormal Lab    Jordan Gregory is a 65 y.o. female.  She has had right clavicle swelling for about 6 months.  She saw oncology today and they took some basic lab work and are setting her up for an IR biopsy.  Later today she received a phone call that her creatinine was elevated and that she needed to come to the emergency department.  She denies any particular dehydration, no recent vomiting or diarrhea.  She uses infrequent NSAIDs.  She says she otherwise feels very well.  The history is provided by the patient.  Abnormal Lab Time since result:  Hours Patient referred by:  Specialist Resulting agency:  Internal Result type: chemistry   Chemistry:    Creatinine:  High     Past Medical History:  Diagnosis Date   Hypertension    Insomnia    Thyroid disease    Vitamin D deficiency     Patient Active Problem List   Diagnosis Date Noted   Clavicle enlargement 10/23/2020   Impaired glucose tolerance 02/06/2015   Irritable bowel syndrome 10/15/2012   Uterine fibroid 09/13/2012   Female pelvic pain 08/19/2012   Postmenopausal bleeding 08/19/2012   Palpitations 04/29/2011   Hypertension 10/03/2010   Insomnia 09/05/2010   Hypothyroidism 08/03/2010    Past Surgical History:  Procedure Laterality Date   MOHS SURGERY  2016     OB History     Gravida  0   Para      Term      Preterm      AB      Living         SAB      IAB      Ectopic      Multiple      Live Births              Family History  Problem Relation Age of Onset   Arthritis Mother    Hypertension Mother    Dementia Mother    Heart disease Father    Cancer Father 81       Liver    Social History   Tobacco Use   Smoking status: Never   Smokeless tobacco: Never  Vaping Use   Vaping Use: Never used   Substance Use Topics   Alcohol use: Yes    Alcohol/week: 5.0 standard drinks    Types: 5 Standard drinks or equivalent per week   Drug use: No    Home Medications Prior to Admission medications   Medication Sig Start Date End Date Taking? Authorizing Provider  calcium carbonate (OS-CAL) 600 MG TABS Take 600 mg by mouth daily.    [provider]  levothyroxine (SYNTHROID) 75 MCG tablet Take 1 tablet (75 mcg total) by mouth daily before breakfast. 02/16/20   Nafziger, Tommi Rumps, NP  losartan (COZAAR) 100 MG tablet TAKE 1 TABLET BY MOUTH EVERY DAY 01/26/20   Nafziger, Tommi Rumps, NP  methylPREDNISolone (MEDROL DOSEPAK) 4 MG TBPK tablet Take as directed 09/21/20   Dorothyann Peng, NP  traZODone (DESYREL) 50 MG tablet TAKE 0.5-1 TABLETS BY MOUTH AT BEDTIME AS NEEDED FOR SLEEP. 09/25/20   Nafziger, Tommi Rumps, NP  VITAMIN D, CHOLECALCIFEROL, PO Take 2,000 Int'l Units by mouth daily.    [provider]    Allergies  Ramipril  Review of Systems   Review of Systems  Constitutional:  Negative for fever.  HENT:  Negative for sore throat.   Eyes:  Negative for visual disturbance.  Respiratory:  Negative for shortness of breath.   Cardiovascular:  Negative for chest pain.  Gastrointestinal:  Negative for abdominal pain.  Genitourinary:  Negative for dysuria.  Musculoskeletal:  Negative for neck pain.  Skin:  Negative for rash.  Neurological:  Negative for headaches.   Physical Exam Updated Vital Signs BP (!) 175/98 (BP Location: Left Arm)   Pulse (!) 107   Temp 98.1 F (36.7 C) (Oral)   Resp 20   LMP 03/04/2011   SpO2 99%   Physical Exam Vitals and nursing note reviewed.  Constitutional:      General: She is not in acute distress.    Appearance: Normal appearance. She is well-developed.  HENT:     Head: Normocephalic and atraumatic.  Eyes:     Conjunctiva/sclera: Conjunctivae normal.  Cardiovascular:     Rate and Rhythm: Normal rate and regular rhythm.     Heart sounds: No  murmur heard. Pulmonary:     Effort: Pulmonary effort is normal. No respiratory distress.     Breath sounds: Normal breath sounds.  Abdominal:     Palpations: Abdomen is soft.     Tenderness: There is no abdominal tenderness. There is no guarding or rebound.  Musculoskeletal:        General: Normal range of motion.     Cervical back: Neck supple.     Right lower leg: No edema.     Left lower leg: No edema.  Skin:    General: Skin is warm and dry.  Neurological:     General: No focal deficit present.     Mental Status: She is alert.    ED Results / Procedures / Treatments   Labs (all labs ordered are listed, but only abnormal results are displayed) Labs Reviewed  BASIC METABOLIC PANEL - Abnormal; Notable for the following components:      Result Value   Glucose, Bld 106 (*)    BUN 44 (*)    Creatinine, Ser 3.30 (*)    GFR, Estimated 15 (*)    All other components within normal limits  URINALYSIS, ROUTINE W REFLEX MICROSCOPIC - Abnormal; Notable for the following components:   Color, Urine STRAW (*)    Hgb urine dipstick SMALL (*)    Bacteria, UA RARE (*)    All other components within normal limits  CREATININE CLEARANCE, URINE, 24 HOUR - Abnormal; Notable for the following components:   Creatinine, 24H Ur 29 (*)    Creatinine Clearance 1 (*)    All other components within normal limits  SODIUM, URINE, RANDOM    EKG None  Radiology US Renal  Result Date: 10/23/2020 CLINICAL DATA:  Acute renal injury EXAM: RENAL / URINARY TRACT ULTRASOUND COMPLETE COMPARISON:  None. FINDINGS: Right Kidney: Renal measurements: 9.6 x 4.7 x 4.6 cm. = volume: 109 mL. No hydronephrosis is noted. Mildly prominent extrarenal pelvis is seen. No mass lesion is noted. Left Kidney: Renal measurements: 9.5 x 5.3 x 5.4 cm. = volume: 142 mL. Echogenicity within normal limits. No mass or hydronephrosis visualized. Bladder: Decompressed Other: None. IMPRESSION: No acute abnormality is noted within the  kidneys. Electronically Signed   By: Inez Catalina M.D.   On: 10/23/2020 20:29    Procedures Procedures   Medications Ordered in ED Medications  sodium chloride 0.9 %  bolus 1,000 mL (0 mLs Intravenous Stopped 10/23/20 2101)    ED Course  I have reviewed the triage vital signs and the nursing notes.  Pertinent labs & imaging results that were available during my care of the patient were reviewed by me and considered in my medical decision making (see chart for details).  Clinical Course as of 10/24/20 1221  Tue Oct 23, 2020  1944 Discussed with nephrology Dr. Moshe Cipro.  She does not think the patient needs to be admitted.  She asked that we stop her losartan and see if we can get a renal ultrasound.  She will have her office try to get her in this week. [MB]    Clinical Course User Index [MB] Hayden Rasmussen, MD   MDM Rules/Calculators/A&P                          This patient complains of abnormal lab value in the setting of clavicle mass; this involves an extensive number of treatment Options and is a complaint that carries with it a high risk of complications and Morbidity. The differential includes dehydration, AKI, obstruction, renal disease, tumor  I ordered, reviewed and interpreted labs, which included chemistries with elevated creatinine and BUN, urinalysis without clear signs of infection.  Additionally patient had multiple labs done earlier today in oncology clinic and these were reviewed I ordered medication IV fluids I ordered imaging studies which included renal ultrasound and I independently    visualized and interpreted imaging which showed no acute findings  Previous records obtained and reviewed in epic including prior oncology note today I consulted nephrology Dr. Moshe Cipro and discussed lab and imaging findings  Critical Interventions: None  After the interventions stated above, I reevaluated the patient and found patient to be asymptomatic and in no  distress.  Reviewed work-up and recommendations from nephrology with patient.  She will hold her losartan and follow-up with nephrology this week.  Return instructions discussed   Final Clinical Impression(s) / ED Diagnoses Final diagnoses:  AKI (acute kidney injury) [facility])    Rx / [place] Orders ED Discharge Orders     None        Hayden Rasmussen, MD 10/24/20 1224

## 2020-10-23 NOTE — ED Triage Notes (Signed)
Patient states she had blood work drawn by [employer] today and patient was notified today to go to the ED for abnormal lab. BUN-38 and creatinine-3.49.

## 2020-10-23 NOTE — Discharge Instructions (Addendum)
You were seen in the emergency department for abnormal lab work done at your oncology appointment today.  Your creatinine was elevated which is a measure of your renal function.  You had an ultrasound that did not show any obvious obstruction.  Dr. Moshe Cipro nephrology recommends that you stop your losartan and she will have her office set up an appointment with you this week.  Avoid NSAIDs like ibuprofen and naproxen for now.  Drink plenty of fluids.  Return to the emergency department for any worsening or concerning symptoms

## 2020-10-23 NOTE — Patient Instructions (Signed)
Thank you for choosing [facility] to provide your care.   Should you have questions after your visit to the [facility] ([facility]), please contact this office at [phone] between 8:30 AM and 4:30 PM.  Voice mails left after 4:00 PM may not be returned until the following business day.  Calls received after 4:30 PM will be answered by an off-site Nurse Triage Line.    Prescription Refills:  Please have your pharmacy contact us directly for most prescription requests.  Contact the office directly for refills of narcotics (pain medications). Allow 48-72 hours for refills.  Appointments: Please contact the [facility] scheduling department [phone] for questions regarding [facility] appointment scheduling.  Contact the schedulers with any scheduling changes so that your appointment can be rescheduled in a timely manner.   Central Scheduling for [place] [phone] - Call to schedule procedures such as PET scans, CT scans, MRI, Ultrasound, etc.  To afford each patient quality time with our providers, please arrive 30 minutes before your scheduled appointment time.  If you arrive late for your appointment, you may be asked to reschedule.  We strive to give you quality time with our providers, and arriving late affects you and other patients whose appointments are after yours. If you are a no show for multiple scheduled visits, you may be dismissed from the clinic at the providers discretion.     Resources: [facility] Social Workers 336-832-0950 for additional information on assistance programs or assistance connecting with community support programs   [place] DSS  336-641-3447: Information regarding food stamps, Medicaid, and utility assistance [place] [employer] [place] 336-333-6589   [place] [employer]'s shared-ride transportation service for eligible riders who have a disability that prevents them from riding the fixed route bus.   Medicare Rights Center 800-333-4114  Helps people with Medicare understand their rights and benefits, navigate the Medicare system, and secure the quality healthcare they deserve [employer] 800-227-2345 Assists patients locate various types of support and financial assistance Cancer Care: 1-800-813-HOPE (4673) Provides financial assistance, online support groups, medication/co-pay assistance.   Transportation Assistance for appointments at [facility]: Transportation Coordinator [phone]  Again, thank you for choosing [facility] for your care.       

## 2020-10-23 NOTE — Telephone Encounter (Signed)
CRITICAL VALUE STICKER  CRITICAL VALUE: SCr 3.49  RECEIVER (on-site recipient of call): Dow Adolph, [place] NOTIFIED: 10/23/2020 1630  MESSENGER (representative from lab): Duwayne Heck  MD NOTIFIED: Dede Query, PA  TIME OF NOTIFICATION: 5300  RESPONSE:  PA will contact patient

## 2020-10-23 NOTE — Progress Notes (Signed)
[place] [phone]   [phone]  INITIAL CONSULTATION:  Patient Care Team: Dorothyann Peng, NP as PCP - General (Family Medicine)  CHIEF COMPLAINTS/PURPOSE OF CONSULTATION:  Right clavicle mass  HISTORY OF PRESENTING ILLNESS:  Jordan Gregory 65 y.o. female with medical history significant for hypothyroidism, hypertension and vitamin D deficiency. She is unaccompanied for this visit.   On review of the previous records Ms. Juarbe presented to her PCP with right clavicle pain and swelling for one week in April 2022. Xray was obtained that  showed mild right acromioclavicular joint osteoarthritis with no acute bony abnormality. Patient continued to have pain and followed up with her PCP in September 2022. MRI was obtained on 10/15/2020 that showed pathologic fracture the medial clavicle associated with a 5.1 x 2.7 x 4.1 cm mass of the medial clavicle.   On exam today, Jordan Gregory reports that her energy levels are fairly stable. She notes mild fatigue but overall continues to complete all her daily activities on her own. She notes chronic low back pain with pain that radiates to the right leg. She was recently prescribed medrol dosepak with minimal improvement. She has a good appetite without any recent weight changes. She denies nausea, vomiting or abdominal pain. Her bowel movements are regular. She denies easy bruising or signs of bleeding. She reports that the palpable right sided clavicle mass became more noticeable in the last 4-5 months. She denies any pain of the mass and uncertain if it has increased in size. She is able to ambulate her right upper extremity without any limitations. She denies fevers, chills, night sweats, shortness of breath, chest pain or cough. She has no other complaints. Rest of the 10 point ROS is below.   MEDICAL HISTORY:  Past Medical History:  Diagnosis Date   Hypertension    Insomnia     Thyroid disease    Vitamin D deficiency     SURGICAL HISTORY: Past Surgical History:  Procedure Laterality Date   MOHS SURGERY  2016    SOCIAL HISTORY: Social History   Socioeconomic History   Marital status: Married    Spouse name: Not on file   Number of children: Not on file   Years of education: Not on file   Highest education level: Not on file  Occupational History   Not on file  Tobacco Use   Smoking status: Never   Smokeless tobacco: Never  Vaping Use   Vaping Use: Never used  Substance and Sexual Activity   Alcohol use: Yes    Alcohol/week: 5.0 standard drinks    Types: 5 Standard drinks or equivalent per week   Drug use: No   Sexual activity: Yes    Birth control/protection: Post-menopausal    Comment: 1st intercourse 58 yo-5 partners  Other Topics Concern   Not on file  Social History Narrative   She works at [employer] and [employer].    Married - 23 years    No kids       She likes to travel, read, cycle, be outside.    Social Determinants of Health   Financial Resource Strain: Not on file  Food Insecurity: Not on file  Transportation Needs: No Transportation Needs   Lack of Transportation (Medical): No   Lack of Transportation (Non-Medical): No  Physical Activity: Not on file  Stress: Not on file  Social Connections: Not on file  Intimate Partner Violence: Not on file    FAMILY  HISTORY: Family History  Problem Relation Age of Onset   Arthritis Mother    Hypertension Mother    Dementia Mother    Heart disease Father    Cancer Father 78       Liver    ALLERGIES:  is allergic to ramipril.  MEDICATIONS:  Current Outpatient Medications  Medication Sig Dispense Refill   calcium carbonate (OS-CAL) 600 MG TABS Take 600 mg by mouth daily.     levothyroxine (SYNTHROID) 75 MCG tablet Take 1 tablet (75 mcg total) by mouth daily before breakfast. 90 tablet 3   losartan (COZAAR) 100 MG tablet TAKE 1 TABLET BY MOUTH EVERY DAY 90 tablet 3    methylPREDNISolone (MEDROL DOSEPAK) 4 MG TBPK tablet Take as directed 21 tablet 0   traZODone (DESYREL) 50 MG tablet TAKE 0.5-1 TABLETS BY MOUTH AT BEDTIME AS NEEDED FOR SLEEP. 90 tablet 1   VITAMIN D, CHOLECALCIFEROL, PO Take 2,000 Int'l Units by mouth daily.     No current facility-administered medications for this visit.    REVIEW OF SYSTEMS:   Constitutional: ( - ) fevers, ( - )  chills , ( - ) night sweats Eyes: ( - ) blurriness of vision, ( - ) double vision, ( - ) watery eyes Ears, nose, mouth, throat, and face: ( - ) mucositis, ( - ) sore throat Respiratory: ( - ) cough, ( - ) dyspnea, ( - ) wheezes Cardiovascular: ( - ) palpitation, ( - ) chest discomfort, ( - ) lower extremity swelling Gastrointestinal:  ( - ) nausea, ( - ) heartburn, ( - ) change in bowel habits Skin: ( - ) abnormal skin rashes Lymphatics: ( - ) new lymphadenopathy, ( - ) easy bruising Neurological: ( - ) numbness, ( - ) tingling, ( - ) new weaknesses Behavioral/Psych: ( - ) mood change, ( - ) new changes  All other systems were reviewed with the patient and are negative.  PHYSICAL EXAMINATION: ECOG PERFORMANCE STATUS: 1 - Symptomatic but completely ambulatory  Vitals:   10/23/20 1358  BP: (!) 172/78  Pulse: (!) 105  Resp: 20  Temp: 98 F (36.7 C)  SpO2: 99%   Filed Weights   10/23/20 1358  Weight: 184 lb 4.8 oz (83.6 kg)    GENERAL: well appearing female in NAD  SKIN: skin color, texture, turgor are normal, no rashes or significant lesions EYES: conjunctiva are pink and non-injected, sclera clear OROPHARYNX: no exudate, no erythema; lips, buccal mucosa, and tongue normal  NECK: supple, non-tender. Palpable, firm mass on the mid right clavicle that is nontender and nonmobile measuring approximately 5 cm in diameter.  LYMPH:  no palpable lymphadenopathy in the cervical, axillary or supraclavicular lymph nodes.  LUNGS: clear to auscultation and percussion with normal breathing effort HEART:  regular rate & rhythm and no murmurs and no lower extremity edema ABDOMEN: soft, non-tender, non-distended, normal bowel sounds Musculoskeletal: no cyanosis of digits and no clubbing  PSYCH: alert & oriented x 3, fluent speech NEURO: no focal motor/sensory deficits  LABORATORY DATA:  I have reviewed the data as listed CBC Latest Ref Rng & Units 10/23/2020 12/16/2019 10/07/2018  WBC 4.0 - 10.5 K/uL 3.0(L) 3.2(L) 3.3(L)  Hemoglobin 12.0 - 15.0 g/dL 10.1(L) 13.0 13.7  Hematocrit 36.0 - 46.0 % 30.3(L) 38.0 40.5  Platelets 150 - 400 K/uL 197 186 200.0    CMP Latest Ref Rng & Units 10/23/2020 10/23/2020 12/16/2019  Glucose 70 - 99 mg/dL 106(H) 141(H) 109(H)  BUN 8 -  23 mg/dL 44(H) 38(H) 21  Creatinine 0.44 - 1.00 mg/dL 3.30(H) 3.49(HH) 0.84  Sodium 135 - 145 mmol/L 138 140 141  Potassium 3.5 - 5.1 mmol/L 4.8 4.3 4.8  Chloride 98 - 111 mmol/L 106 106 107  CO2 22 - 32 mmol/L 22 21(L) 27  Calcium 8.9 - 10.3 mg/dL 9.7 9.8 9.4  Total Protein 6.5 - 8.1 g/dL - 8.1 6.9  Total Bilirubin 0.3 - 1.2 mg/dL - 0.4 0.7  Alkaline Phos 38 - 126 U/L - 74 -  AST 15 - 41 U/L - 16 22  ALT 0 - 44 U/L - 12 28     RADIOGRAPHIC STUDIES: I have personally reviewed the radiological images as listed and agreed with the findings in the report. MR Lumbar Spine Wo Contrast  Result Date: 10/16/2020 CLINICAL DATA:  Low back pain. EXAM: MRI LUMBAR SPINE WITHOUT CONTRAST TECHNIQUE: Multiplanar, multisequence MR imaging of the lumbar spine was performed. No intravenous contrast was administered. COMPARISON:  None. FINDINGS: Segmentation:  Standard. Alignment:  Dextroconvex scoliosis. Vertebrae: No fracture, evidence of discitis, or bone lesion. Schmorl node in the superior endplate of M57 with surrounding marrow edema. Conus medullaris and cauda equina: Conus extends to the L1-2 level. Conus and cauda equina appear normal. Paraspinal and other soft tissues: Negative. Disc levels: T12-L1: Tiny central disc protrusion. No  spinal canal neural foraminal stenosis. L1-2: No spinal canal or neural foraminal stenosis. L2-3: Mild loss of disc height, shallow disc bulge and mild facet degenerative changes without significant spinal canal or neural foraminal stenosis. L3-4: Disc bulge with superimposed small central left central disc protrusion and moderate facet degenerative changes, left greater than right, resulting in mil of d narrowing of the left subarticular zone. No significant neural foraminal narrowing. L4-5: Disc bulge and moderate to advanced hypertrophic facet degenerative changes with ligamentum flavum redundancy resulting in mild narrowing of the left subarticular zone. No significant spinal canal or neural foraminal stenosis. L5-S1: Shallow disc bulge, moderate to advanced hypertrophic right and mild-to-moderate left facet degenerative changes without significant spinal canal or neural foraminal stenosis. IMPRESSION: 1. Degenerative changes of the lumbar spine, more pronounced at the level of the facet joints at L3-4, L4-5 and L5-S1 with mild narrowing of the left subarticular zone at L3-4 and L4-5. 2. No high-grade spinal canal stenosis or neural foraminal stenosis at any level. Electronically Signed   By: Pedro Earls M.D.   On: 10/16/2020 15:59   MR STERNUM WO CONTRAST  Result Date: 10/17/2020 CLINICAL DATA:  Right clavicular mass EXAM: MR CHEST WITHOUT CONTRAST TECHNIQUE: Multiplanar, multisequence MR imaging of the right upper chest with attention to the right clavicle was performed. No intravenous contrast was administered. COMPARISON:  Radiographs 04/24/2020 FINDINGS: Bones/Joint/Cartilage: Pathologic fracture the medial clavicle associated with a 5.1 by 2.7 by 4.1 cm (volume = 30 cm^3) mass of the medial clavicle with high T2 and low T1 signal characteristics and lobular morphology of the extraosseous components. Much of this mass is extraosseous and extends both into the anterior upper  mediastinum and along the upper margin of the pectoralis muscle, and a component of the mass extends longitudinally in the medullary space of the medial clavicle with associated endosteal scalloping. No sternal involvement or overt effusion of the sternoclavicular joint. No other regional bony lesions are identified. Ligaments: Coracoclavicular ligament appears intact. Muscles and Tendons: As noted above, the clavicular mass bulges into the upper margin of the pectoralis major muscle. Soft tissue: No additional significant soft tissue  related findings. IMPRESSION: 1. 30 cc mass of the right medial clavicle with bony destructive findings an underlying pathologic fracture is well as extraosseous extension, causing anterior bulging of the upper pectoralis major muscle and slight posterior displacement of the right for subclavian vein. This mass has high T2 and low T1 signal characteristics and internal heterogeneity in septation raising concern for malignancy, and also is component extending longitudinally in the medullary space of the distal clavicle. Neoplastic etiology such as metastatic disease or plasmacytoma favored over infection, although correlation with the patient's clinical presentation and scenario is recommended. Orthopedic tumor specialist referral for tissue diagnosis is likely warranted. Electronically Signed   By: Van Clines M.D.   On: 10/17/2020 13:00   US Renal  Result Date: 10/23/2020 CLINICAL DATA:  Acute renal injury EXAM: RENAL / URINARY TRACT ULTRASOUND COMPLETE COMPARISON:  None. FINDINGS: Right Kidney: Renal measurements: 9.6 x 4.7 x 4.6 cm. = volume: 109 mL. No hydronephrosis is noted. Mildly prominent extrarenal pelvis is seen. No mass lesion is noted. Left Kidney: Renal measurements: 9.5 x 5.3 x 5.4 cm. = volume: 142 mL. Echogenicity within normal limits. No mass or hydronephrosis visualized. Bladder: Decompressed Other: None. IMPRESSION: No acute abnormality is noted within  the kidneys. Electronically Signed   By: Inez Catalina M.D.   On: 10/23/2020 20:29    ASSESSMENT & PLAN Jordan Gregory is a 65 y.o. female who presents to the clinic for evaluation for right mid clavicle mass with associated pathologic fracture. Discuss process for diagnostic workup including laboratory evaluation and biopsy. Patient will proceed with labs today to check CBC, CMP, LDH, sedimentation rate, C-reactive protein, SPEP with IFE and serum free light chains. Additionally, after reviewing the location of the mass we recommend proceed with a US guided needle biopsy instead of excisional biopsy. We will reach out to interventional radiology to assist with the biopsy. Additional imaging will be ordered once diagnosis is confirmed.   #Right mid clavicle mass with associated pathologic fracture: --CBC, CMP, LDH, sedimentation rate, C-reactive protein, SPEP with IFE and serum free light chains --Request US guided needle biopsy. Referral sent to IR.  --Plan to order additional imaging once diagnosis is confirmed.  --Return to clinic based on above workup.   **ADDENDUM: I received a critical result from today's lab that showed marked increase in creatinine levels from 0.84 to 3.49. Advised patient to go to [facility] emergency room for further evaluation.   Orders Placed This Encounter  Procedures   DG Bone Survey Met    Standing Status:   Future    Standing Expiration Date:   10/25/2021    Order Specific Question:   Reason for Exam (SYMPTOM  OR DIAGNOSIS REQUIRED)    Answer:   evaluate for lytic lesions    Order Specific Question:   Preferred imaging location?    Answer:   [facility]   CBC with Differential ([facility] Only)    Standing Status:   Future    Number of Occurrences:   1    Standing Expiration Date:   10/23/2021   CMP ([facility] only)    Standing Status:   Future    Number of Occurrences:   1    Standing Expiration Date:   10/23/2021   Lactate  dehydrogenase (LDH)    Standing Status:   Future    Number of Occurrences:   1    Standing Expiration Date:   10/23/2021   Sedimentation rate  Standing Status:   Future    Number of Occurrences:   1    Standing Expiration Date:   10/23/2021   C-reactive protein    Standing Status:   Future    Number of Occurrences:   1    Standing Expiration Date:   10/23/2021   Multiple Myeloma Panel (SPEP&IFE w/QIG)    Standing Status:   Future    Number of Occurrences:   1    Standing Expiration Date:   10/23/2021   Kappa/lambda light chains    Standing Status:   Future    Number of Occurrences:   1    Standing Expiration Date:   10/23/2021   24-Hr Ur UPEP/UIFE/Light Chains/TP    Standing Status:   Future    Standing Expiration Date:   10/25/2021    All questions were answered. The patient knows to call the clinic with any problems, questions or concerns.  I have spent a total of 60 minutes minutes of face-to-face and non-face-to-face time, preparing to see the patient, obtaining and/or reviewing separately obtained history, performing a medically appropriate examination, counseling and educating the patient, ordering tests, referring and communicating with other health care professionals, documenting clinical information in the electronic health record, independently interpreting results and communicating results to the patient, and care coordination.   Dede Query, PA-C Department of Hematology/Oncology [place] [facility] Phone: [phone]  Patient was seen with Dr. Lorenso Courier.   I have read the above note and personally examined the patient. I agree with the assessment and plan as noted above.  Briefly Jordan Gregory is a 65 year old female who presents for evaluation of a mass which caused a pathological fracture of her clavicle.  At this time findings are concerning for a hematological malignancy, in particular I am concerned for multiple myeloma.  We have  ordered serum studies including SPEP and serum free light chains in order to begin the work-up for multiple myeloma.  Additionally we will pursue a biopsy of the mass itself, however if the labs found be consistent with multiple myeloma would recommend pursuing a bone marrow biopsy instead.  We will plan to have the patient return to clinic after we have obtained a tissue diagnosis.   Ledell Peoples, MD Department of Hematology/Oncology [place] [facility] Phone: [phone] Pager: [phone] Email: [email or web address]

## 2020-10-24 ENCOUNTER — Telehealth: Payer: Self-pay | Admitting: Physician Assistant

## 2020-10-24 LAB — KAPPA/LAMBDA LIGHT CHAINS
Kappa free light chain: 36.6 mg/L — ABNORMAL HIGH (ref 3.3–19.4)
Kappa, lambda light chain ratio: 0.01 — ABNORMAL LOW (ref 0.26–1.65)
Lambda free light chains: 4355.8 mg/L — ABNORMAL HIGH (ref 5.7–26.3)

## 2020-10-24 NOTE — Telephone Encounter (Addendum)
I called Ms. Sweitzer today to review some of the preliminary lab results after our visit yesterday on 10/23/2020. CBC was significant for persistent leukopenia and now evidence of normocytic anemia with Hgb of 10.1. CMP shows marked increase in creatinine levels to 3.49. Patient was evaluated in the ED yesterday including renal US that ruled out hydronephrosis. Serum free light chains showed elevated lambda free light chains and ratio of 0.01.   I explained results to patient and findings are concerning for plasma cell disorder including multiple myeloma. I reviewed next steps including a bone marrow biopsy scheduled for 10/30/2020. Additionally, we will complete the workup with UPEP and bone met survey. SPEP with IFE is pending from yesterday.   Patient will return for a follow up to review remaining labs and biopsy results with Dr. Lorenso Courier on 11/06/2020.

## 2020-10-25 ENCOUNTER — Ambulatory Visit: Payer: 59 | Admitting: Physical Therapy

## 2020-10-25 ENCOUNTER — Telehealth: Payer: Self-pay | Admitting: *Deleted

## 2020-10-25 ENCOUNTER — Telehealth: Payer: Self-pay

## 2020-10-25 NOTE — Telephone Encounter (Signed)
This RN was contacted by path at [facility] stating they could not do the BMBX as requested 10/19 to be done 10/25.  This RN was informed [facility]/NP had been notified and nurse assigned to her today is in process of rescheduling.

## 2020-10-25 NOTE — Telephone Encounter (Signed)
Pt is not able to hae BMBX 10/25 d/t overbook in flow cytometry. Pt is aware her bmbx has been moved to 11/2. Pt also questions when she would need to come in for 24 hr urine test. Pt verbalizes she lives [place] min from [facility] and would not mind completing this soon. Routed to PA to advise. Pt aware I will call back tomorrow.

## 2020-10-26 ENCOUNTER — Telehealth: Payer: Self-pay | Admitting: Hematology and Oncology

## 2020-10-26 NOTE — Telephone Encounter (Signed)
Scheduled per 10/20 secure chat, called patient regarding November appointments. Patient is notified.

## 2020-10-29 ENCOUNTER — Telehealth: Payer: Self-pay | Admitting: Hematology and Oncology

## 2020-10-29 LAB — MULTIPLE MYELOMA PANEL, SERUM
Albumin SerPl Elph-Mcnc: 4.1 g/dL (ref 2.9–4.4)
Albumin/Glob SerPl: 1.3 (ref 0.7–1.7)
Alpha 1: 0.2 g/dL (ref 0.0–0.4)
Alpha2 Glob SerPl Elph-Mcnc: 0.8 g/dL (ref 0.4–1.0)
B-Globulin SerPl Elph-Mcnc: 0.9 g/dL (ref 0.7–1.3)
Gamma Glob SerPl Elph-Mcnc: 1.3 g/dL (ref 0.4–1.8)
Globulin, Total: 3.2 g/dL (ref 2.2–3.9)
IgA: 51 mg/dL — ABNORMAL LOW (ref 87–352)
IgG (Immunoglobin G), Serum: 1340 mg/dL (ref 586–1602)
IgM (Immunoglobulin M), Srm: 36 mg/dL (ref 26–217)
M Protein SerPl Elph-Mcnc: 0.4 g/dL — ABNORMAL HIGH
Total Protein ELP: 7.3 g/dL (ref 6.0–8.5)

## 2020-10-29 NOTE — Telephone Encounter (Signed)
R/s per 10/24 sch msg, pt has confirmed new appt

## 2020-10-30 ENCOUNTER — Inpatient Hospital Stay: Payer: 59

## 2020-10-30 ENCOUNTER — Other Ambulatory Visit: Payer: Self-pay

## 2020-11-01 ENCOUNTER — Other Ambulatory Visit: Payer: Self-pay | Admitting: *Deleted

## 2020-11-01 DIAGNOSIS — M899 Disorder of bone, unspecified: Secondary | ICD-10-CM | POA: Diagnosis not present

## 2020-11-01 DIAGNOSIS — I10 Essential (primary) hypertension: Secondary | ICD-10-CM | POA: Insufficient documentation

## 2020-11-01 DIAGNOSIS — M89319 Hypertrophy of bone, unspecified shoulder: Secondary | ICD-10-CM

## 2020-11-01 DIAGNOSIS — G8929 Other chronic pain: Secondary | ICD-10-CM | POA: Insufficient documentation

## 2020-11-01 DIAGNOSIS — M8440XA Pathological fracture, unspecified site, initial encounter for fracture: Secondary | ICD-10-CM | POA: Diagnosis not present

## 2020-11-01 DIAGNOSIS — E559 Vitamin D deficiency, unspecified: Secondary | ICD-10-CM | POA: Insufficient documentation

## 2020-11-01 DIAGNOSIS — Z808 Family history of malignant neoplasm of other organs or systems: Secondary | ICD-10-CM | POA: Diagnosis not present

## 2020-11-01 DIAGNOSIS — M549 Dorsalgia, unspecified: Secondary | ICD-10-CM | POA: Insufficient documentation

## 2020-11-01 DIAGNOSIS — E039 Hypothyroidism, unspecified: Secondary | ICD-10-CM | POA: Diagnosis not present

## 2020-11-02 ENCOUNTER — Telehealth: Payer: Self-pay | Admitting: Hematology and Oncology

## 2020-11-02 NOTE — Telephone Encounter (Signed)
R/s per 10/28 sch msg, pt has been called and confirmed appt

## 2020-11-05 LAB — UPEP/UIFE/LIGHT CHAINS/TP, 24-HR UR
% BETA, Urine: 88 %
ALPHA 1 URINE: 1.9 %
Albumin, U: 3.9 %
Alpha 2, Urine: 3.7 %
Free Kappa Lt Chains,Ur: 89.03 mg/L — ABNORMAL HIGH (ref 1.17–86.46)
Free Kappa/Lambda Ratio: 0.01 — ABNORMAL LOW (ref 1.83–14.26)
GAMMA GLOBULIN URINE: 2.5 %
M-SPIKE %, Urine: 80.5 % — ABNORMAL HIGH
M-Spike, Mg/24 Hr: 5994 mg/24 hr — ABNORMAL HIGH
Total Protein, Urine-Ur/day: 7446 mg/24 hr — ABNORMAL HIGH (ref 30–150)
Total Protein, Urine: 292 mg/dL
Total Volume: 2550

## 2020-11-06 ENCOUNTER — Ambulatory Visit (HOSPITAL_COMMUNITY)
Admission: RE | Admit: 2020-11-06 | Discharge: 2020-11-06 | Disposition: A | Discharge status: Home / Self Care | Payer: 59 | Source: Ambulatory Visit | Attending: Physician Assistant | Admitting: Physician Assistant

## 2020-11-06 ENCOUNTER — Other Ambulatory Visit: Payer: 59

## 2020-11-06 ENCOUNTER — Ambulatory Visit: Payer: 59 | Admitting: Hematology and Oncology

## 2020-11-06 ENCOUNTER — Other Ambulatory Visit: Payer: Self-pay

## 2020-11-06 DIAGNOSIS — M89319 Hypertrophy of bone, unspecified shoulder: Secondary | ICD-10-CM | POA: Diagnosis not present

## 2020-11-06 IMAGING — DX DG BONE SURVEY MET
9 of 10 series · 9 of 10 positions shown · non-contrast
Comparison: MRI sternum and lumbar spine [DATE]

CLINICAL DATA: evaluate for lytic lesions

EXAM:
METASTATIC BONE SURVEY

[skull lat]
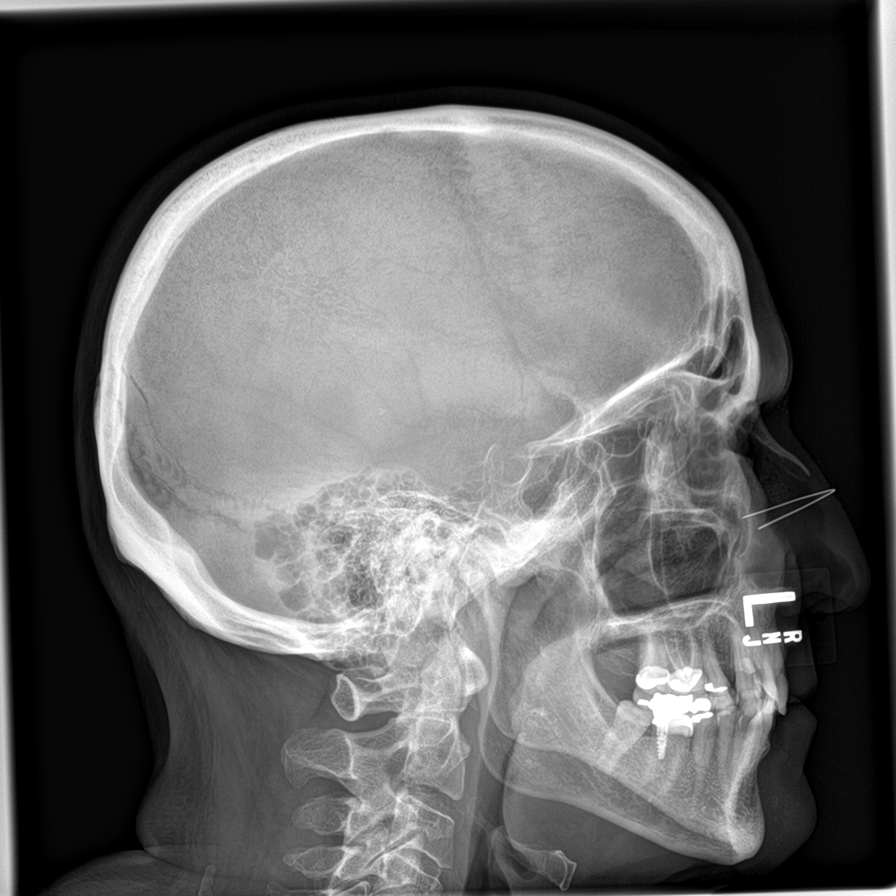

[shoulder ap (1 of 2)]
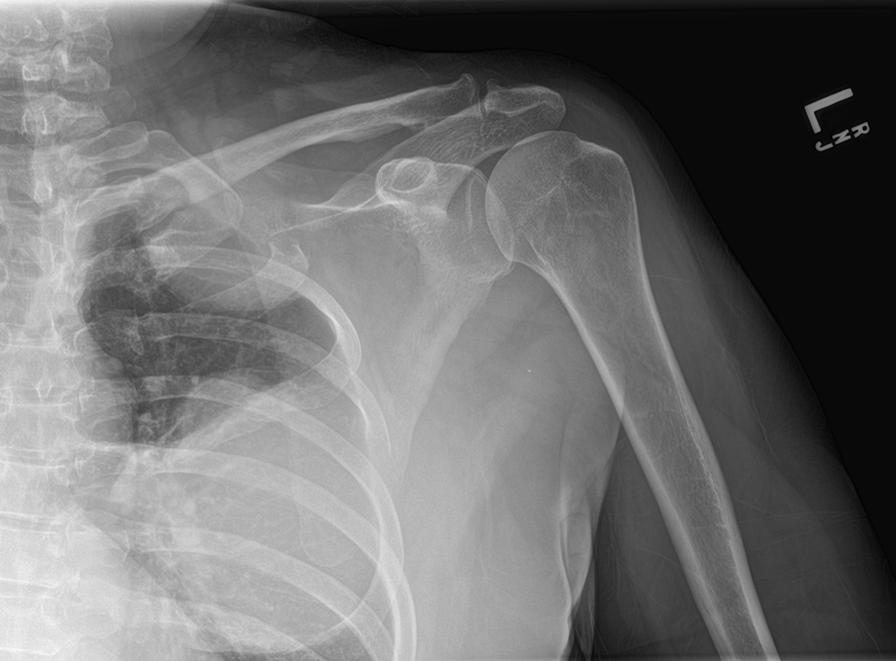

[shoulder ap (2 of 2)]
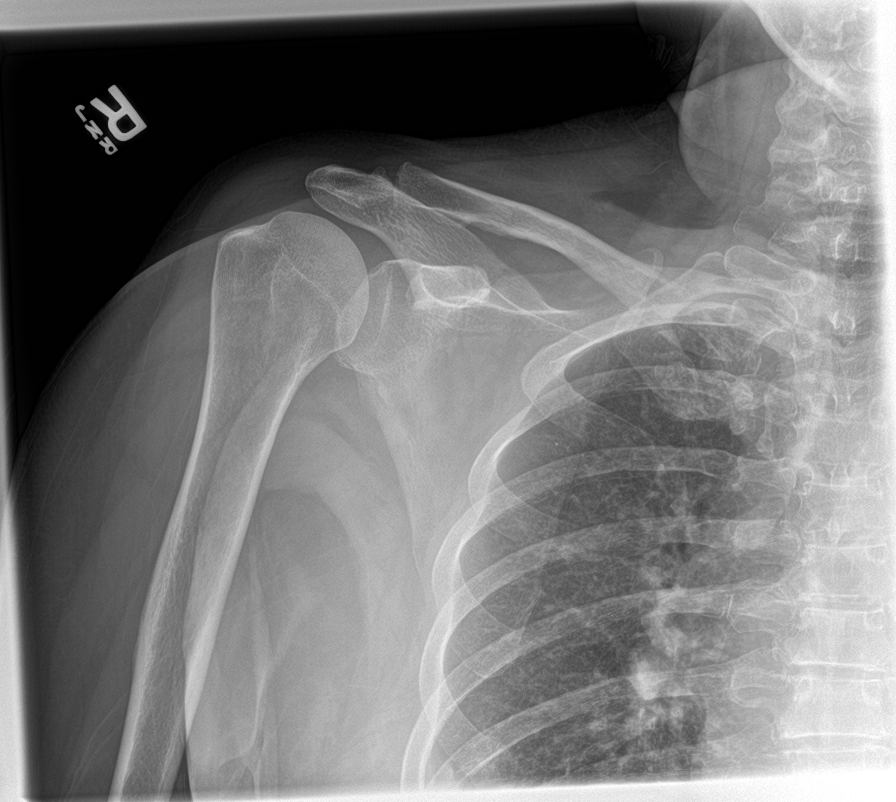

[humerus ap (1 of 2)]
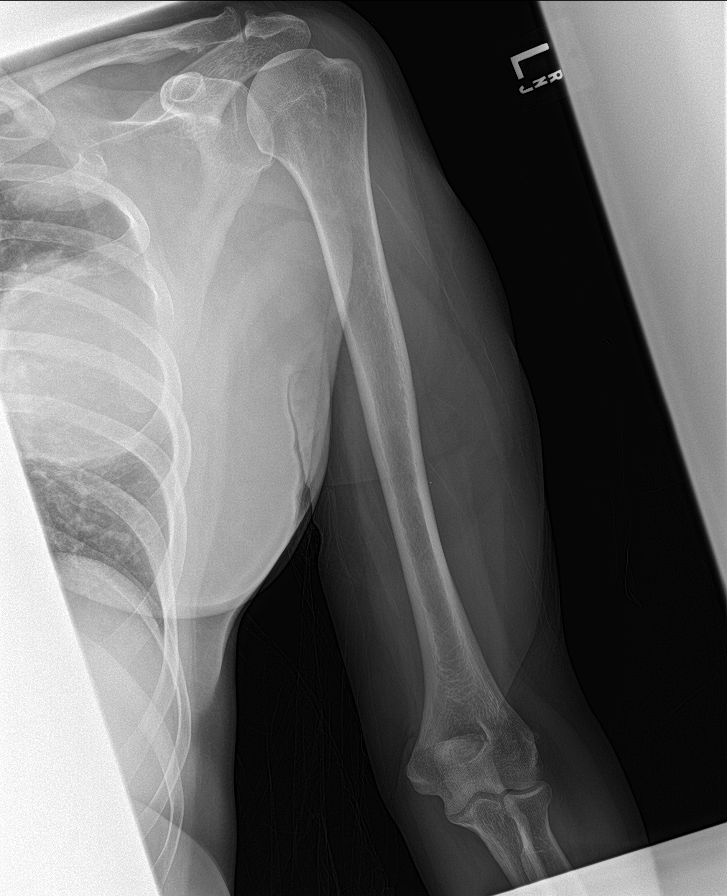

[humerus ap (2 of 2)]
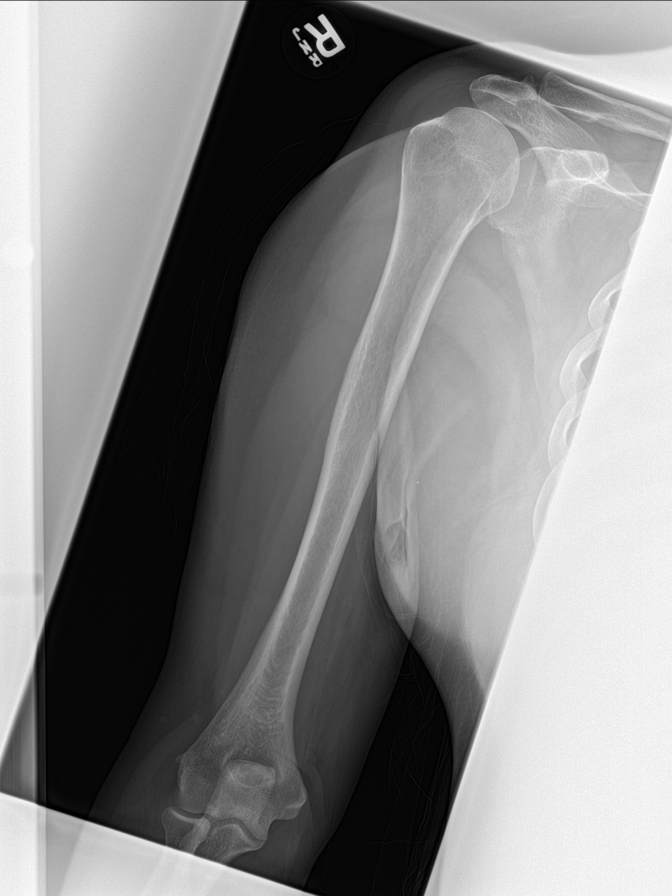

[forearm ap (1 of 2)]
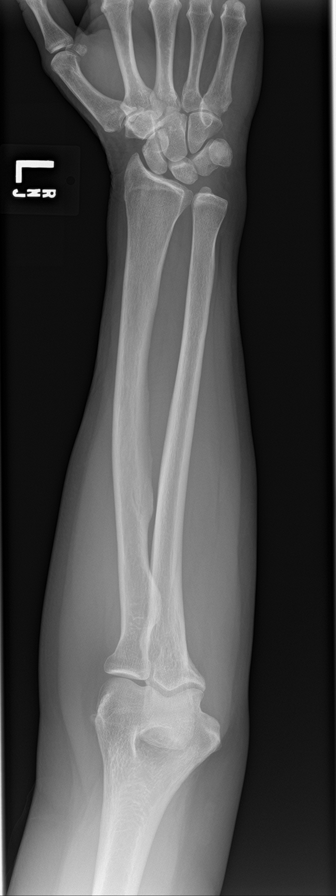

[forearm ap (2 of 2)]
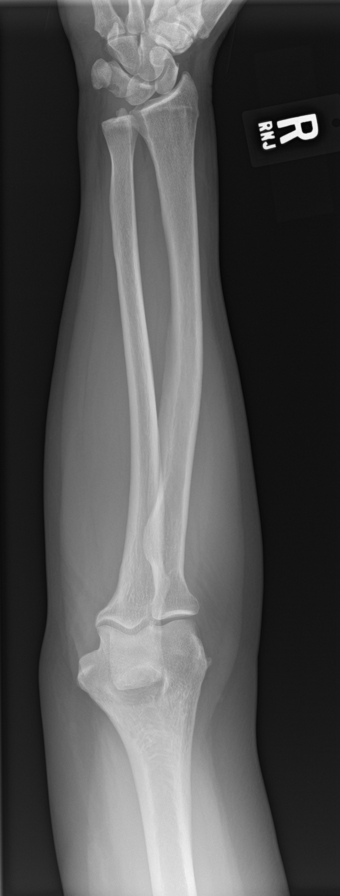

[c-spine ap]
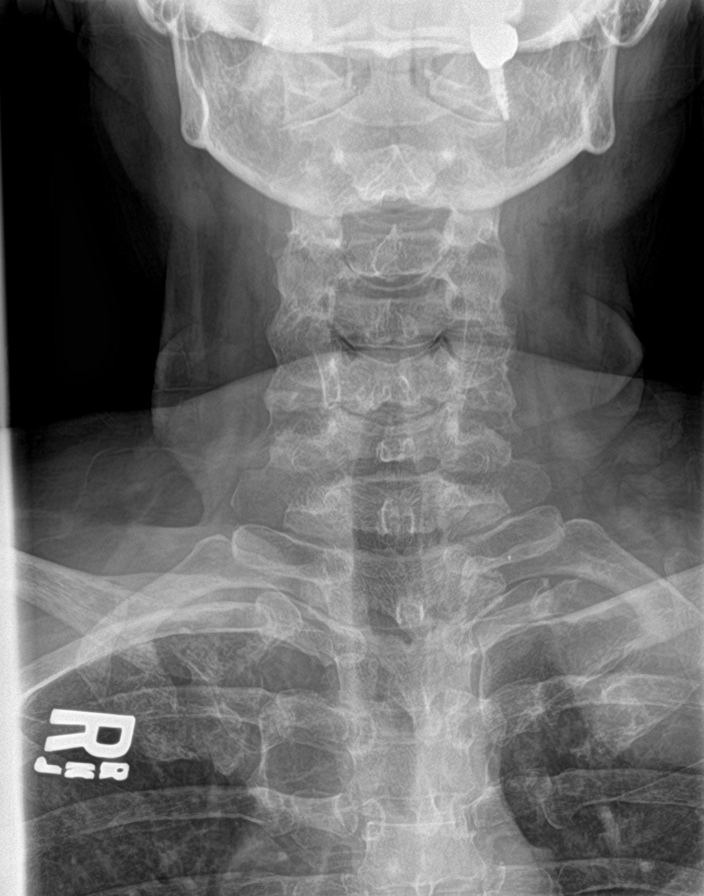

[c-spine lat]
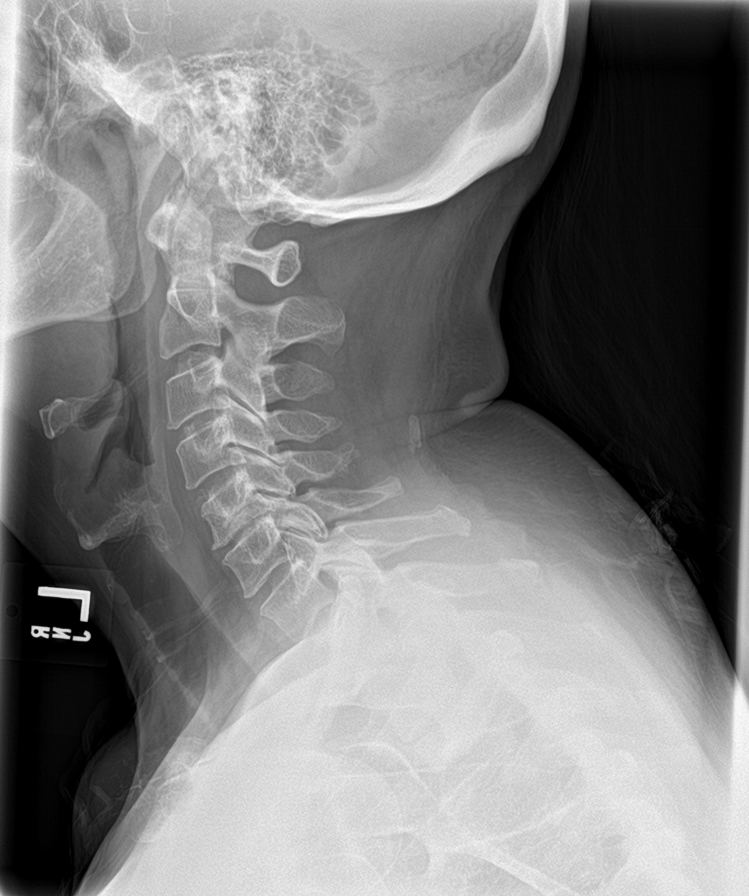

[9 of 10 positions shown; findings below may reference images not displayed]

FINDINGS: There is a posterior left 2nd rib lytic/destructive process noted
with associated soft tissue mass.

Probable lytic/destructive process/lesion in the anterolateral left
4th rib.

Large mass lesion projected over the left mid lung measuring up to
10 cm. It is difficult to determine if this is a pulmonary mass or
soft tissue mass related to a rib lesion.

Loss of cortex involving the medial right clavicle corresponding to
the mass seen on prior MRI in the medial right clavicle.

Soft tissue mass projects over the right lateral lower chest,
possibly associated with a right lateral 7th rib lytic lesion.

Lytic expansile lesion seen in the right inferior pubic ramus
extending into the inferior acetabulum.
IMPRESSION: Multiple bilateral rib lytic/destructive lesions with associated
soft tissue masses peripherally. There is a large left mid lung soft
tissue mass. It is difficult to determine if this is a primary
pulmonary mass lesion or arising from a rib. Recommend chest CT with
IV contrast for further evaluation.

Expansile lytic lesion in the medial right clavicle head.

Expansile lytic lesion in the right inferior pubic ramus extending
into the inferior right acetabulum.

These lesions are most compatible with metastases or myeloma.

## 2020-11-06 NOTE — Progress Notes (Signed)
INDICATION: Myeloma work up  Brief examination was performed. ENT: adequate airway clearance Heart: regular rate and rhythm.No Murmurs Lungs: clear to auscultation, no wheezes, normal respiratory effort  Bone Marrow Biopsy and Aspiration Procedure Note   Informed consent was obtained and potential risks including bleeding, infection and pain were reviewed with the patient.  The patient's name, date of birth, identification, consent and allergies were verified prior to the start of procedure and time out was performed.  The left posterior iliac crest was chosen as the site of biopsy.  The skin was prepped with ChloraPrep.   14 cc of 2% lidocaine was used to provide local anaesthesia.   10 cc of bone marrow aspirate was obtained followed by 1cm biopsy.  Pressure was applied to the biopsy site and bandage was placed over the biopsy site. Patient was made to lie on the back for 30 mins prior to discharge.  The procedure was tolerated well. COMPLICATIONS: None BLOOD LOSS: none The patient was discharged home in stable condition with a 1 week follow up to review results.  Patient was provided with post bone marrow biopsy instructions and instructed to call if there was any bleeding or worsening pain.  Specimens sent for flow cytometry, cytogenetics and additional studies.  Signed Scot Dock, NP

## 2020-11-07 ENCOUNTER — Inpatient Hospital Stay: Payer: 59 | Attending: Physician Assistant | Admitting: Adult Health

## 2020-11-07 ENCOUNTER — Inpatient Hospital Stay: Payer: 59

## 2020-11-07 VITALS — BP 151/73 | HR 77 | Temp 98.3°F | Resp 18

## 2020-11-07 DIAGNOSIS — Z808 Family history of malignant neoplasm of other organs or systems: Secondary | ICD-10-CM | POA: Insufficient documentation

## 2020-11-07 DIAGNOSIS — C9030 Solitary plasmacytoma not having achieved remission: Secondary | ICD-10-CM | POA: Insufficient documentation

## 2020-11-07 DIAGNOSIS — C9000 Multiple myeloma not having achieved remission: Secondary | ICD-10-CM

## 2020-11-07 LAB — CBC WITH DIFFERENTIAL (CANCER CENTER ONLY)
Abs Immature Granulocytes: 0 10*3/uL (ref 0.00–0.07)
Basophils Absolute: 0 10*3/uL (ref 0.0–0.1)
Basophils Relative: 1 %
Eosinophils Absolute: 0.1 10*3/uL (ref 0.0–0.5)
Eosinophils Relative: 5 %
HCT: 25.7 % — ABNORMAL LOW (ref 36.0–46.0)
Hemoglobin: 8.8 g/dL — ABNORMAL LOW (ref 12.0–15.0)
Immature Granulocytes: 0 %
Lymphocytes Relative: 36 %
Lymphs Abs: 0.9 10*3/uL (ref 0.7–4.0)
MCH: 28.6 pg (ref 26.0–34.0)
MCHC: 34.2 g/dL (ref 30.0–36.0)
MCV: 83.4 fL (ref 80.0–100.0)
Monocytes Absolute: 0.4 10*3/uL (ref 0.1–1.0)
Monocytes Relative: 14 %
Neutro Abs: 1.2 10*3/uL — ABNORMAL LOW (ref 1.7–7.7)
Neutrophils Relative %: 44 %
Platelet Count: 156 10*3/uL (ref 150–400)
RBC: 3.08 MIL/uL — ABNORMAL LOW (ref 3.87–5.11)
RDW: 13.2 % (ref 11.5–15.5)
WBC Count: 2.6 10*3/uL — ABNORMAL LOW (ref 4.0–10.5)
nRBC: 0 % (ref 0.0–0.2)

## 2020-11-07 NOTE — Patient Instructions (Signed)
Bone Marrow Aspiration and Bone Marrow Biopsy, Adult, Care After This sheet gives you information about how to care for yourself after your procedure. Your health care provider may also give you more specific instructions. If you have problems or questions, contact your health care provider. What can I expect after the procedure? After the procedure, it is common to have: Mild pain and tenderness. Swelling. Bruising. Follow these instructions at home: Puncture site care  Follow instructions from your health care provider about how to take care of the puncture site. Make sure you: Wash your hands with soap and water before and after you change your bandage (dressing). If soap and water are not available, use hand sanitizer. Change your dressing as told by your health care provider. Check your puncture site every day for signs of infection. Check for: More redness, swelling, or pain. Fluid or blood. Warmth. Pus or a bad smell. Activity Return to your normal activities as told by your health care provider. Ask your health care provider what activities are safe for you. Do not lift anything that is heavier than 10 lb (4.5 kg), or the limit that you are told, until your health care provider says that it is safe. Do not drive for 24 hours if you were given a sedative during your procedure. General instructions  Take over-the-counter and prescription medicines only as told by your health care provider. Do not take baths, swim, or use a hot tub until your health care provider approves. Ask your health care provider if you may take showers. You may only be allowed to take sponge baths. If directed, put ice on the affected area. To do this: Put ice in a plastic bag. Place a towel between your skin and the bag. Leave the ice on for 20 minutes, 2-3 times a day. Keep all follow-up visits as told by your health care provider. This is important. Contact a health care provider if: Your pain is not  controlled with medicine. You have a fever. You have more redness, swelling, or pain around the puncture site. You have fluid or blood coming from the puncture site. Your puncture site feels warm to the touch. You have pus or a bad smell coming from the puncture site. Summary After the procedure, it is common to have mild pain, tenderness, swelling, and bruising. Follow instructions from your health care provider about how to take care of the puncture site and what activities are safe for you. Take over-the-counter and prescription medicines only as told by your health care provider. Contact a health care provider if you have any signs of infection, such as fluid or blood coming from the puncture site. This information is not intended to replace advice given to you by your health care provider. Make sure you discuss any questions you have with your health care provider. Document Revised: 05/11/2018 Document Reviewed: 05/11/2018 Elsevier Patient Education  2022 [employer]  

## 2020-11-09 ENCOUNTER — Other Ambulatory Visit: Payer: Self-pay

## 2020-11-09 ENCOUNTER — Inpatient Hospital Stay (HOSPITAL_BASED_OUTPATIENT_CLINIC_OR_DEPARTMENT_OTHER): Payer: 59 | Admitting: Hematology and Oncology

## 2020-11-09 ENCOUNTER — Inpatient Hospital Stay: Payer: 59

## 2020-11-09 VITALS — BP 153/81 | HR 87 | Temp 98.0°F | Resp 18 | Wt 180.8 lb

## 2020-11-09 DIAGNOSIS — C9000 Multiple myeloma not having achieved remission: Secondary | ICD-10-CM

## 2020-11-09 DIAGNOSIS — C9030 Solitary plasmacytoma not having achieved remission: Secondary | ICD-10-CM

## 2020-11-09 LAB — SURGICAL PATHOLOGY

## 2020-11-09 NOTE — Progress Notes (Signed)
START ON PATHWAY REGIMEN - Multiple Myeloma and Other Plasma Cell Dyscrasias     A cycle is every 28 days:     Dexamethasone      Bortezomib      Cyclophosphamide   **Always confirm dose/schedule in your pharmacy ordering system**  Patient Characteristics: Multiple Myeloma, Newly Diagnosed, Transplant Eligible, Unknown or Awaiting Test Results Disease Classification: Multiple Myeloma R-ISS Staging: II Therapeutic Status: Newly Diagnosed Is Patient Eligible for Transplant<= Transplant Eligible Risk Status: Awaiting Test Results Intent of Therapy: Non-Curative / Palliative Intent, Discussed with Patient

## 2020-11-13 ENCOUNTER — Encounter: Payer: Self-pay | Admitting: Hematology and Oncology

## 2020-11-13 NOTE — Progress Notes (Signed)
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  Serological testing concerning for a plasma cell neoplasm, with UPEP showing 7.4 g of protein daily with markedly high lambda light chains 11/07/2020: Bone marrow biopsy performed, shows no evidence of multiple myeloma  Interval History:  Jordan Gregory 65 y.o. 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 The patient's last visit was on 10/23/2020 at which time she met with Dede Query. In the interim since the last visit she has undergone a bone marrow biopsy which shows no evidence of multiple myeloma.  On exam today Jordan Gregory reports that she has been well in the interim since her last visit.  She is not noticing any increase in the size of her clavicular lesion.  She denies that it is painful or causing any other issues.  She is not having any issues with fevers, chills, sweats, nausea, vomiting or diarrhea.  A full 10 point ROS is listed below.  The bulk of our discussion focused on the current findings and the concern for multiple myeloma.  Also discussed that these may be plasmacytomas without multiple myeloma.  In the event that there is no evidence of multiple myeloma would need to proceed with radiation therapy.  Multiple myeloma is found to need to consider treatment with chemotherapy alone.  The patient voiced understanding of this plan moving  forward.  MEDICAL HISTORY:  Past Medical History:  Diagnosis Date   Hypertension    Insomnia    Thyroid disease    Vitamin D deficiency     SURGICAL HISTORY: Past Surgical History:  Procedure Laterality Date   MOHS SURGERY  2016    SOCIAL HISTORY: Social History   Socioeconomic History   Marital status: Married    Spouse name: Not on file   Number of children: Not on file   Years of education: Not on file   Highest education level: Not on file  Occupational History   Not on file  Tobacco Use   Smoking status: Never   Smokeless tobacco: Never  Vaping Use   Vaping Use: Never used  Substance and Sexual Activity   Alcohol use: Yes    Alcohol/week: 5.0 standard drinks    Types: 5 Standard drinks or equivalent per week   Drug use: No   Sexual activity: Yes    Birth control/protection: Post-menopausal    Comment: 1st intercourse 24 yo-5 partners  Other Topics Concern   Not on file  Social History Narrative   She works at [employer] and [employer].    Married - 23 years    No kids       She likes to travel, read, cycle, be outside.    Social Determinants of Health   Financial Resource Strain: Not on file  Food Insecurity: Not on file  Transportation Needs: No Transportation Needs   Lack of Transportation (Medical): No   Lack of Transportation (Non-Medical): No  Physical Activity: Not on file  Stress: Not on file  Social Connections: Not on  file  Intimate Partner Violence: Not on file    FAMILY HISTORY: Family History  Problem Relation Age of Onset   Arthritis Mother    Hypertension Mother    Dementia Mother    Heart disease Father    Cancer Father 1       Liver    ALLERGIES:  is allergic to ramipril.  MEDICATIONS:  Current Outpatient Medications  Medication Sig Dispense Refill   calcium carbonate (OS-CAL) 600 MG TABS Take 600 mg by mouth daily.     levothyroxine (SYNTHROID) 75 MCG tablet Take 1 tablet (75 mcg total) by mouth daily before breakfast. 90  tablet 3   losartan (COZAAR) 100 MG tablet TAKE 1 TABLET BY MOUTH EVERY DAY 90 tablet 3   methylPREDNISolone (MEDROL DOSEPAK) 4 MG TBPK tablet Take as directed 21 tablet 0   traZODone (DESYREL) 50 MG tablet TAKE 0.5-1 TABLETS BY MOUTH AT BEDTIME AS NEEDED FOR SLEEP. 90 tablet 1   VITAMIN D, CHOLECALCIFEROL, PO Take 2,000 Int'l Units by mouth daily.     No current facility-administered medications for this visit.    REVIEW OF SYSTEMS:   Constitutional: ( - ) fevers, ( - )  chills , ( - ) night sweats Eyes: ( - ) blurriness of vision, ( - ) double vision, ( - ) watery eyes Ears, nose, mouth, throat, and face: ( - ) mucositis, ( - ) sore throat Respiratory: ( - ) cough, ( - ) dyspnea, ( - ) wheezes Cardiovascular: ( - ) palpitation, ( - ) chest discomfort, ( - ) lower extremity swelling Gastrointestinal:  ( - ) nausea, ( - ) heartburn, ( - ) change in bowel habits Skin: ( - ) abnormal skin rashes Lymphatics: ( - ) new lymphadenopathy, ( - ) easy bruising Neurological: ( - ) numbness, ( - ) tingling, ( - ) new weaknesses Behavioral/Psych: ( - ) mood change, ( - ) new changes  All other systems were reviewed with the patient and are negative.  PHYSICAL EXAMINATION: ECOG PERFORMANCE STATUS: 1 - Symptomatic but completely ambulatory  Vitals:   11/09/20 0856  BP: (!) 153/81  Pulse: 87  Resp: 18  Temp: 98 F (36.7 C)  SpO2: 100%   Filed Weights   11/09/20 0856  Weight: 180 lb 12.8 oz (82 kg)    GENERAL: Well-appearing middle-age Caucasian female, alert, no distress and comfortable SKIN: skin color, texture, turgor are normal, no rashes or significant lesions EYES: conjunctiva are pink and non-injected, sclera clear CHEST: mass over right clavicle, firm LUNGS: clear to auscultation and percussion with normal breathing effort HEART: regular rate & rhythm and no murmurs and no lower extremity edema Musculoskeletal: no cyanosis of digits and no clubbing  PSYCH: alert & oriented x 3,  fluent speech NEURO: no focal motor/sensory deficits  LABORATORY DATA:  I have reviewed the data as listed CBC Latest Ref Rng & Units 11/07/2020 10/23/2020 12/16/2019  WBC 4.0 - 10.5 K/uL 2.6(L) 3.0(L) 3.2(L)  Hemoglobin 12.0 - 15.0 g/dL 8.8(L) 10.1(L) 13.0  Hematocrit 36.0 - 46.0 % 25.7(L) 30.3(L) 38.0  Platelets 150 - 400 K/uL 156 197 186    CMP Latest Ref Rng & Units 10/23/2020 10/23/2020 12/16/2019  Glucose 70 - 99 mg/dL 106(H) 141(H) 109(H)  BUN 8 - 23 mg/dL 44(H) 38(H) 21  Creatinine 0.44 - 1.00 mg/dL 3.30(H) 3.49(HH) 0.84  Sodium 135 - 145 mmol/L 138 140 141  Potassium 3.5 - 5.1 mmol/L 4.8 4.3 4.8  Chloride 98 -  111 mmol/L 106 106 107  CO2 22 - 32 mmol/L 22 21(L) 27  Calcium 8.9 - 10.3 mg/dL 9.7 9.8 9.4  Total Protein 6.5 - 8.1 g/dL - 8.1 6.9  Total Bilirubin 0.3 - 1.2 mg/dL - 0.4 0.7  Alkaline Phos 38 - 126 U/L - 74 -  AST 15 - 41 U/L - 16 22  ALT 0 - 44 U/L - 12 28    Lab Results  Component Value Date   MPROTEIN 0.4 (H) 10/23/2020   Lab Results  Component Value Date   KPAFRELGTCHN 36.6 (H) 10/23/2020   LAMBDASER 4,355.8 (H) 10/23/2020   KAPLAMBRATIO 0.01 (L) 11/01/2020   KAPLAMBRATIO 0.01 (L) 10/23/2020     RADIOGRAPHIC STUDIES: MR Lumbar Spine Wo Contrast  Result Date: 10/16/2020 CLINICAL DATA:  Low back pain. EXAM: MRI LUMBAR SPINE WITHOUT CONTRAST TECHNIQUE: Multiplanar, multisequence MR imaging of the lumbar spine was performed. No intravenous contrast was administered. COMPARISON:  None. FINDINGS: Segmentation:  Standard. Alignment:  Dextroconvex scoliosis. Vertebrae: No fracture, evidence of discitis, or bone lesion. Schmorl node in the superior endplate of J85 with surrounding marrow edema. Conus medullaris and cauda equina: Conus extends to the L1-2 level. Conus and cauda equina appear normal. Paraspinal and other soft tissues: Negative. Disc levels: T12-L1: Tiny central disc protrusion. No spinal canal neural foraminal stenosis. L1-2: No spinal canal or  neural foraminal stenosis. L2-3: Mild loss of disc height, shallow disc bulge and mild facet degenerative changes without significant spinal canal or neural foraminal stenosis. L3-4: Disc bulge with superimposed small central left central disc protrusion and moderate facet degenerative changes, left greater than right, resulting in mil of d narrowing of the left subarticular zone. No significant neural foraminal narrowing. L4-5: Disc bulge and moderate to advanced hypertrophic facet degenerative changes with ligamentum flavum redundancy resulting in mild narrowing of the left subarticular zone. No significant spinal canal or neural foraminal stenosis. L5-S1: Shallow disc bulge, moderate to advanced hypertrophic right and mild-to-moderate left facet degenerative changes without significant spinal canal or neural foraminal stenosis. IMPRESSION: 1. Degenerative changes of the lumbar spine, more pronounced at the level of the facet joints at L3-4, L4-5 and L5-S1 with mild narrowing of the left subarticular zone at L3-4 and L4-5. 2. No high-grade spinal canal stenosis or neural foraminal stenosis at any level. Electronically Signed   By: Pedro Earls M.D.   On: 10/16/2020 15:59   MR STERNUM WO CONTRAST  Result Date: 10/17/2020 CLINICAL DATA:  Right clavicular mass EXAM: MR CHEST WITHOUT CONTRAST TECHNIQUE: Multiplanar, multisequence MR imaging of the right upper chest with attention to the right clavicle was performed. No intravenous contrast was administered. COMPARISON:  Radiographs 04/24/2020 FINDINGS: Bones/Joint/Cartilage: Pathologic fracture the medial clavicle associated with a 5.1 by 2.7 by 4.1 cm (volume = 30 cm^3) mass of the medial clavicle with high T2 and low T1 signal characteristics and lobular morphology of the extraosseous components. Much of this mass is extraosseous and extends both into the anterior upper mediastinum and along the upper margin of the pectoralis muscle, and a  component of the mass extends longitudinally in the medullary space of the medial clavicle with associated endosteal scalloping. No sternal involvement or overt effusion of the sternoclavicular joint. No other regional bony lesions are identified. Ligaments: Coracoclavicular ligament appears intact. Muscles and Tendons: As noted above, the clavicular mass bulges into the upper margin of the pectoralis major muscle. Soft tissue: No additional significant soft tissue related findings. IMPRESSION: 1. 30 cc  mass of the right medial clavicle with bony destructive findings an underlying pathologic fracture is well as extraosseous extension, causing anterior bulging of the upper pectoralis major muscle and slight posterior displacement of the right for subclavian vein. This mass has high T2 and low T1 signal characteristics and internal heterogeneity in septation raising concern for malignancy, and also is component extending longitudinally in the medullary space of the distal clavicle. Neoplastic etiology such as metastatic disease or plasmacytoma favored over infection, although correlation with the patient's clinical presentation and scenario is recommended. Orthopedic tumor specialist referral for tissue diagnosis is likely warranted. Electronically Signed   By: Van Clines M.D.   On: 10/17/2020 13:00   US Renal  Result Date: 10/23/2020 CLINICAL DATA:  Acute renal injury EXAM: RENAL / URINARY TRACT ULTRASOUND COMPLETE COMPARISON:  None. FINDINGS: Right Kidney: Renal measurements: 9.6 x 4.7 x 4.6 cm. = volume: 109 mL. No hydronephrosis is noted. Mildly prominent extrarenal pelvis is seen. No mass lesion is noted. Left Kidney: Renal measurements: 9.5 x 5.3 x 5.4 cm. = volume: 142 mL. Echogenicity within normal limits. No mass or hydronephrosis visualized. Bladder: Decompressed Other: None. IMPRESSION: No acute abnormality is noted within the kidneys. Electronically Signed   By: Inez Catalina M.D.   On:  10/23/2020 20:29   DG Bone Survey Met  Result Date: 11/06/2020 CLINICAL DATA:  evaluate for lytic lesions EXAM: METASTATIC BONE SURVEY COMPARISON:  MRI sternum and lumbar spine 10/15/2020 FINDINGS: There is a posterior left 2nd rib lytic/destructive process noted with associated soft tissue mass. Probable lytic/destructive process/lesion in the anterolateral left 4th rib. Large mass lesion projected over the left mid lung measuring up to 10 cm. It is difficult to determine if this is a pulmonary mass or soft tissue mass related to a rib lesion. Loss of cortex involving the medial right clavicle corresponding to the mass seen on prior MRI in the medial right clavicle. Soft tissue mass projects over the right lateral lower chest, possibly associated with a right lateral 7th rib lytic lesion. Lytic expansile lesion seen in the right inferior pubic ramus extending into the inferior acetabulum. IMPRESSION: Multiple bilateral rib lytic/destructive lesions with associated soft tissue masses peripherally. There is a large left mid lung soft tissue mass. It is difficult to determine if this is a primary pulmonary mass lesion or arising from a rib. Recommend chest CT with IV contrast for further evaluation. Expansile lytic lesion in the medial right clavicle head. Expansile lytic lesion in the right inferior pubic ramus extending into the inferior right acetabulum. These lesions are most compatible with metastases or myeloma. Electronically Signed   By: Rolm Baptise M.D.   On: 11/06/2020 19:54    ASSESSMENT & PLAN Hartlee Amedee [facility] 65 y.o. 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  There appear to be at least 3 discrete lesions with the largest being in the lung --Serological studies are confirmatory of a plasma  cell neoplasm with markedly high lambda light chains and proteinuria --Bone marrow biopsy shows no evidence of multiple myeloma, implying that these are plasmacytomas without underlying multiple myeloma --We will consult radiation oncology for consideration of radiation to these masses --We will pursue IR guided biopsy of the right clavicular lesion --Patient may require a PET CT scan in order to further evaluate any other possible sites  of plasmacytoma.  We will discuss this with radiation oncology --Return to clinic pending evaluation by the above specialties.  Orders Placed This Encounter  Procedures   Ambulatory referral to Radiation Oncology    Referral Priority:   Routine    Referral Type:   Consultation    Referral Reason:   Specialty Services Required    Requested Specialty:   Radiation Oncology    Number of Visits Requested:   1    All questions were answered. The patient knows to call the clinic with any problems, questions or concerns.  A total of more than 60 minutes were spent on this encounter with face-to-face time and non-face-to-face time, including preparing to see the patient, ordering tests and/or medications, counseling the patient and coordination of care as outlined above.   Ledell Peoples, MD Department of Hematology/Oncology [place] at [facility] Phone: [phone] Pager: [phone] Email: [email or web address]  11/13/2020 1:42 PM

## 2020-11-14 ENCOUNTER — Encounter (HOSPITAL_COMMUNITY): Payer: Self-pay | Admitting: Radiology

## 2020-11-14 NOTE — Progress Notes (Signed)
Patient Name  Jordan Gregory, [place] Sex  Female DOB  November 14, 1955 SSN  [id number] Address  [place] [address] Phone  [phone] [facility])  [phone] (Mobile) *Preferred*    RE: IR biopsy needed Received: Today Criselda Peaches, MD  Cordelia Poche; P Ir Procedure Requests; [facility] Radiology Schedulers Cc: Orson Slick, MD Yes, no problem.   IR Schedulers - approved for US guided biopsy of reportedly palpable clavicular mass.   Thank you all,   Heath   Signed,   Criselda Peaches, MD   Pager: [phone]  Clinic: [phone]        Previous Messages   ----- Message -----  From: Cordelia Poche  Sent: 11/14/2020   8:58 AM EST  To: Criselda Peaches, MD, Ledell Peoples IV, MD  Subject: Melton Alar: IR biopsy needed                           The bone marrow biopsy did not show multiple myeloma so we are thinking this a plasmacytoma of the bone. Could you help arrange a US guided biopsy of the clavicle mass to confirm?   Thanks,  Murray Hodgkins   ----- Message -----  From: Cordelia Poche  Sent: 10/29/2020   2:20 PM EST  To: Criselda Peaches, MD  Subject: RE: IR biopsy needed                           Thank you. Our workup so far is suggesting a plasma cell disorder so we are pursuing a bone marrow biopsy first.   Murray Hodgkins  ----- Message -----  From: Criselda Peaches, MD  Sent: 10/29/2020   8:16 AM EDT  To: Lincoln Brigham, PA-C  Subject: RE: IR biopsy needed                           Yes, we should be able to target a palpable mass for US guided biopsy.  Be happy to see her.   Heath    ----- Message -----  From: Cordelia Poche  Sent: 10/23/2020   3:39 PM EDT  To: Criselda Peaches, MD  Subject: IR biopsy needed                               Hey Dr. Geroge Baseman,   Dr. Lorenso Courier and I saw [name] today for a mass of the medial clavicle that is causing a pathologic fracture. The mass is superficial enough to  be palpated. Would you be able to see the patient for a biopsy?   Thanks,  Murray Hodgkins

## 2020-11-15 ENCOUNTER — Other Ambulatory Visit: Payer: Self-pay | Admitting: Hematology and Oncology

## 2020-11-15 DIAGNOSIS — C9030 Solitary plasmacytoma not having achieved remission: Secondary | ICD-10-CM

## 2020-11-16 ENCOUNTER — Other Ambulatory Visit: Payer: Self-pay | Admitting: Physician Assistant

## 2020-11-16 DIAGNOSIS — M89319 Hypertrophy of bone, unspecified shoulder: Secondary | ICD-10-CM

## 2020-11-16 DIAGNOSIS — C9030 Solitary plasmacytoma not having achieved remission: Secondary | ICD-10-CM

## 2020-11-16 DIAGNOSIS — C9000 Multiple myeloma not having achieved remission: Secondary | ICD-10-CM

## 2020-11-19 ENCOUNTER — Encounter (HOSPITAL_COMMUNITY): Payer: Self-pay

## 2020-11-22 ENCOUNTER — Other Ambulatory Visit (HOSPITAL_COMMUNITY): Payer: Self-pay | Admitting: Physician Assistant

## 2020-11-23 ENCOUNTER — Ambulatory Visit (HOSPITAL_COMMUNITY)
Admission: RE | Admit: 2020-11-23 | Discharge: 2020-11-23 | Disposition: A | Discharge status: Home / Self Care | Payer: 59 | Source: Ambulatory Visit | Attending: Physician Assistant | Admitting: Physician Assistant

## 2020-11-23 ENCOUNTER — Encounter (HOSPITAL_COMMUNITY): Payer: Self-pay

## 2020-11-23 ENCOUNTER — Other Ambulatory Visit: Payer: Self-pay

## 2020-11-23 DIAGNOSIS — M899 Disorder of bone, unspecified: Secondary | ICD-10-CM | POA: Diagnosis present

## 2020-11-23 DIAGNOSIS — C9000 Multiple myeloma not having achieved remission: Secondary | ICD-10-CM | POA: Insufficient documentation

## 2020-11-23 DIAGNOSIS — M25511 Pain in right shoulder: Secondary | ICD-10-CM | POA: Insufficient documentation

## 2020-11-23 DIAGNOSIS — R809 Proteinuria, unspecified: Secondary | ICD-10-CM | POA: Diagnosis present

## 2020-11-23 DIAGNOSIS — C9030 Solitary plasmacytoma not having achieved remission: Secondary | ICD-10-CM

## 2020-11-23 DIAGNOSIS — R252 Cramp and spasm: Secondary | ICD-10-CM | POA: Insufficient documentation

## 2020-11-23 DIAGNOSIS — M79651 Pain in right thigh: Secondary | ICD-10-CM | POA: Insufficient documentation

## 2020-11-23 DIAGNOSIS — I10 Essential (primary) hypertension: Secondary | ICD-10-CM | POA: Insufficient documentation

## 2020-11-23 DIAGNOSIS — M89319 Hypertrophy of bone, unspecified shoulder: Secondary | ICD-10-CM

## 2020-11-23 LAB — CBC
HCT: 29.9 % — ABNORMAL LOW (ref 36.0–46.0)
Hemoglobin: 9.8 g/dL — ABNORMAL LOW (ref 12.0–15.0)
MCH: 28.4 pg (ref 26.0–34.0)
MCHC: 32.8 g/dL (ref 30.0–36.0)
MCV: 86.7 fL (ref 80.0–100.0)
Platelets: 191 10*3/uL (ref 150–400)
RBC: 3.45 MIL/uL — ABNORMAL LOW (ref 3.87–5.11)
RDW: 13.5 % (ref 11.5–15.5)
WBC: 2.5 10*3/uL — ABNORMAL LOW (ref 4.0–10.5)
nRBC: 0 % (ref 0.0–0.2)

## 2020-11-23 LAB — PROTIME-INR
INR: 1 (ref 0.8–1.2)
Prothrombin Time: 13.1 seconds (ref 11.4–15.2)

## 2020-11-23 IMAGING — US IR BIOPSY CORE MUSCLE/SOFT TISSUE
1 series · 13 of 13 positions shown · non-contrast
Comparison: none

INDICATION: 65-year-old with multiple bone lesions. Patient has a large
exophytic destructive lesion involving the medial right clavicle.
Tissue diagnosis is needed.

[Series 1: us biopsy abd rp mass mc & wl · 13 of 13 slices shown]
[im 1/13]
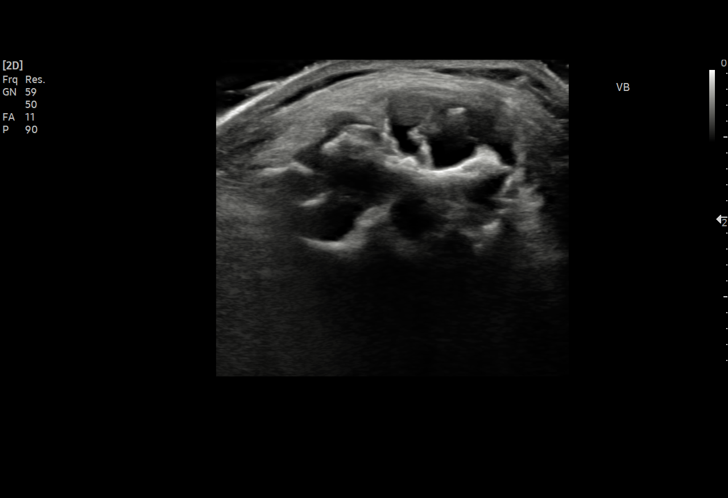
[im 2/13]
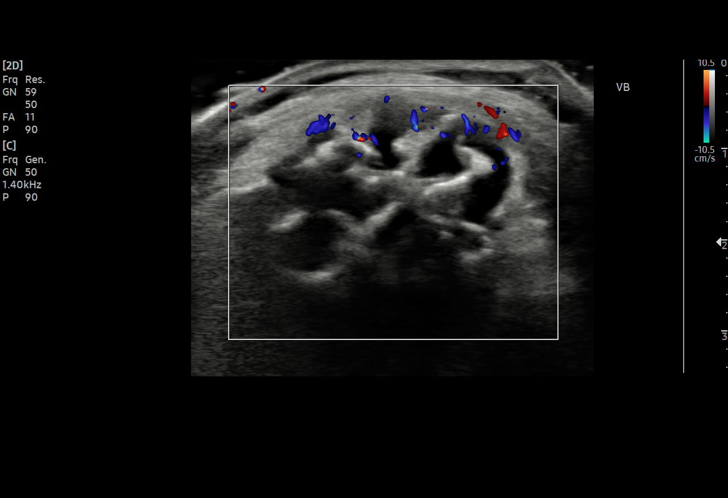
[im 3/13]
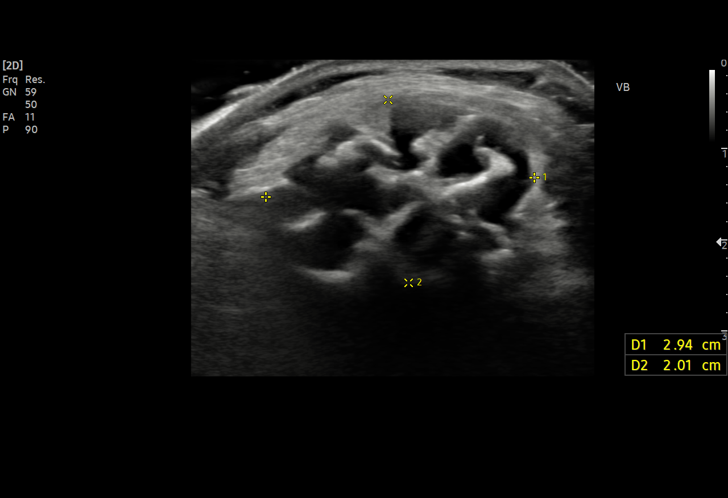
[im 4/13]
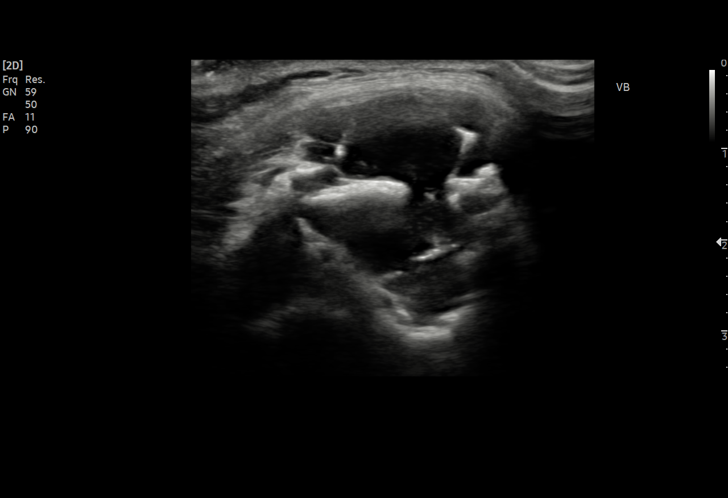
[im 5/13]
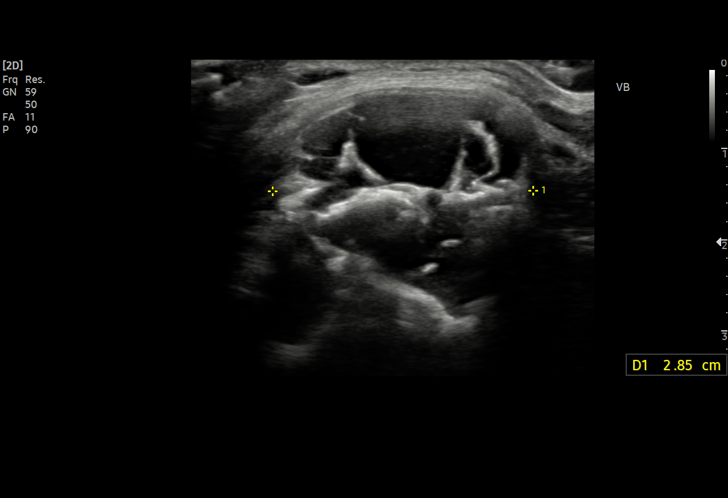
[im 6/13]
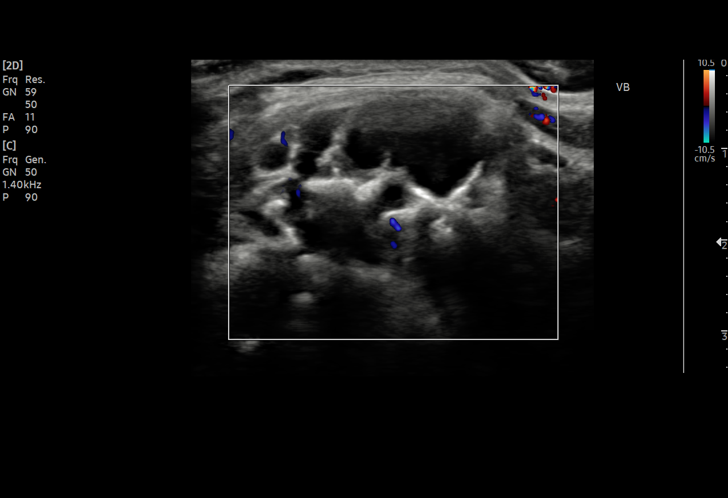
[im 7/13]
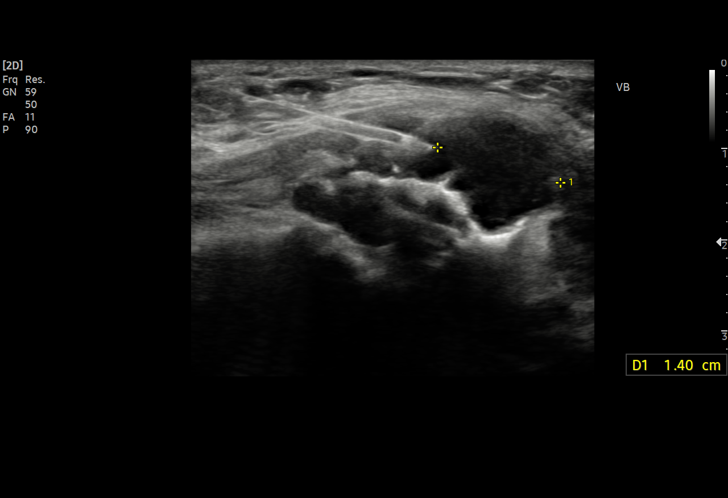
[im 8/13]
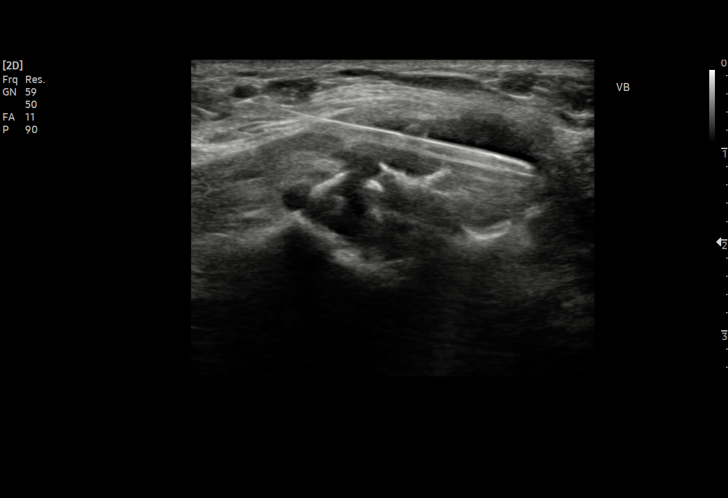
[im 9/13]
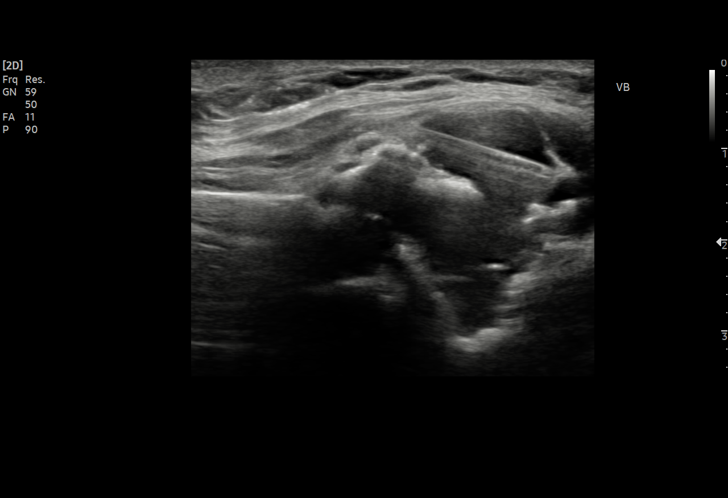
[im 10/13]
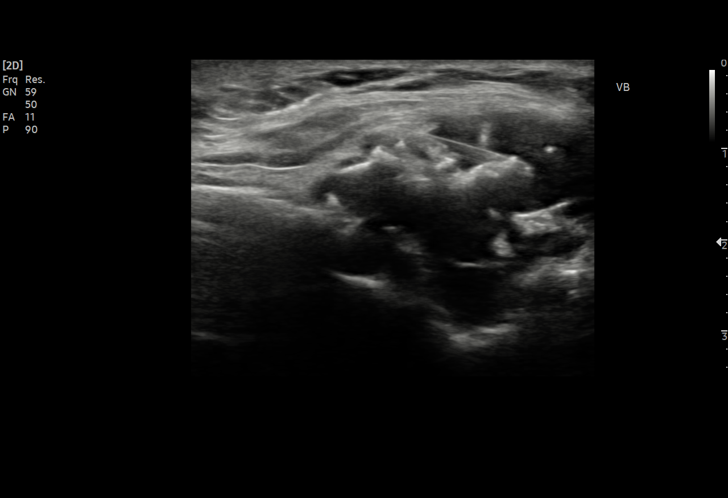
[im 11/13]
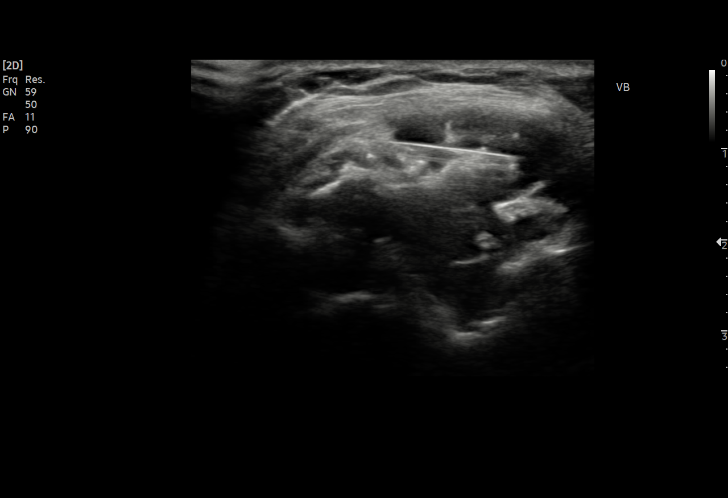
[im 12/13]
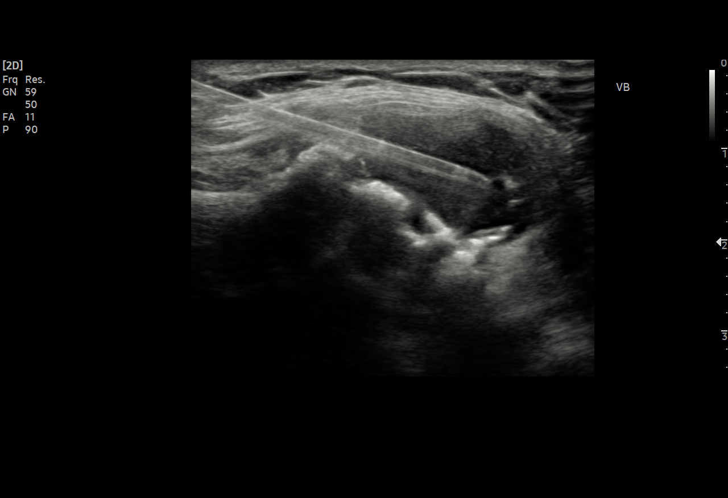
[im 13/13]
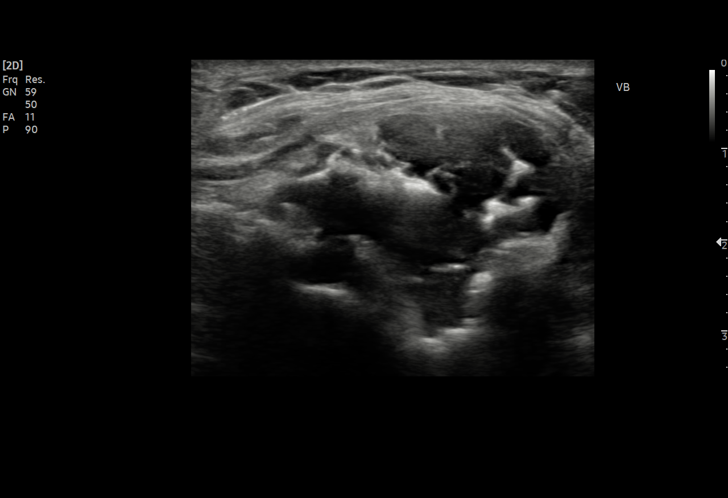

[13 of 13 positions shown; findings below may reference images not displayed]

EXAM:
Ultrasound-guided core biopsy of right clavicular mass

MEDICATIONS:
None.

ANESTHESIA/SEDATION:
Moderate (conscious) sedation was employed during this procedure. A
total of Versed 2.0mg and fentanyl 100 mcg was administered
intravenously at the order of the provider performing the procedure.

Total intra-service moderate sedation time: 13 minutes.

Patient's level of consciousness and vital signs were monitored
continuously by radiology nurse throughout the procedure under the
supervision of the provider performing the procedure.

FLUOROSCOPY TIME:  None

COMPLICATIONS:
None immediate.

PROCEDURE:
Informed written consent was obtained from the patient after a
thorough discussion of the procedural risks, benefits and
alternatives. All questions were addressed. A timeout was performed
prior to the initiation of the procedure.

The right medial clavicle area was evaluated with ultrasound. This
area was prepped with chlorhexidine and sterile field was created.
Skin and soft tissues were anesthetized using 1% lidocaine. Small
incision was made. Using real-time ultrasound guidance, an 18 gauge
core biopsy device was directed into the exophytic lesion. Total of
5 soft tissue core biopsies were obtained and placed in saline.
Bandage placed over the puncture site.
FINDINGS: Large palpable mass along the medial right clavicle. Ultrasound
demonstrates s heterogeneous exophytic lesion that roughly measures
2.4 x 2.0 x 2.9 cm. Soft tissue component is very hypoechoic but
there are hyperechoic osseous components. 5 soft tissue core
biopsies were obtained.
IMPRESSION: Ultrasound-guided core biopsy of the medial right clavicle mass.

## 2020-11-23 MED ORDER — FENTANYL CITRATE (PF) 100 MCG/2ML IJ SOLN
INTRAMUSCULAR | Status: AC | PRN
  Administered 2020-11-23 (×2): 50 ug via INTRAVENOUS

## 2020-11-23 MED ORDER — MIDAZOLAM HCL 2 MG/2ML IJ SOLN
INTRAMUSCULAR | Status: AC
  Filled 2020-11-23: qty 2

## 2020-11-23 MED ORDER — FENTANYL CITRATE (PF) 100 MCG/2ML IJ SOLN
INTRAMUSCULAR | Status: AC
  Filled 2020-11-23: qty 2

## 2020-11-23 MED ORDER — MIDAZOLAM HCL 2 MG/2ML IJ SOLN
INTRAMUSCULAR | Status: AC | PRN
  Administered 2020-11-23 (×2): 1 mg via INTRAVENOUS

## 2020-11-23 MED ORDER — LIDOCAINE HCL 1 % IJ SOLN
INTRAMUSCULAR | Status: AC
  Filled 2020-11-23: qty 20

## 2020-11-23 MED ORDER — SODIUM CHLORIDE 0.9 % IV SOLN: INTRAVENOUS | Status: DC

## 2020-11-23 NOTE — Procedures (Signed)
Interventional Radiology Procedure:   Indications: Large lesion from right clavicle  Procedure: US guided core biospy  Findings: Heterogenous exophytic lesion from medial right clavicle.  5 core biopsies obtained and placed in saline.  Soft tissue cores obtained.   Complications: No immediate complications noted.     EBL: Minimal  Plan: Discharge to home in 1 hour.   Felisha Claytor R. Anselm Pancoast, MD  Pager: [phone]

## 2020-11-23 NOTE — Discharge Instructions (Signed)
Please call Interventional Radiology clinic [phone] with any questions or concerns.   You may remove your dressing and shower tomorrow.    Needle Biopsy, Care After These instructions tell you how to care for yourself after your procedure. Your doctor may also give you more specific instructions. Call your doctor if youhave any problems or questions. What can I expect after the procedure? After the procedure, it is common to have: Soreness. Bruising. Mild pain. Follow these instructions at home:  Return to your normal activities as told by your doctor. Ask your doctor what activities are safe for you. Take over-the-counter and prescription medicines only as told by your doctor. Wash your hands with soap and water before you change your bandage (dressing). If you cannot use soap and water, use hand sanitizer. Follow instructions from your doctor about: How to take care of your puncture site. When and how to change your bandage. When to remove your bandage. Check your puncture site every day for signs of infection. Watch for: Redness, swelling, or pain. Fluid or blood. Pus or a bad smell. Warmth. Do not take baths, swim, or use a hot tub until your doctor approves. Ask your doctor if you may take showers. You may only be allowed to take sponge baths. Keep all follow-up visits as told by your doctor. This is important. Contact a doctor if you have: A fever. Redness, swelling, or pain at the puncture site, and it lasts longer than a few days. Fluid, blood, or pus coming from the puncture site. Warmth coming from the puncture site. Get help right away if: You have a lot of bleeding from the puncture site. Summary After the procedure, it is common to have soreness, bruising, or mild pain at the puncture site. Check your puncture site every day for signs of infection, such as redness, swelling, or pain. Get help right away if you have severe bleeding from your puncture  site. This information is not intended to replace advice given to you by your health care provider. Make sure you discuss any questions you have with your healthcare provider. Document Revised: 06/23/2019 Document Reviewed: 06/23/2019 Elsevier Patient Education  2022 Elsevier Inc.   Moderate Conscious Sedation, Adult, Care After This sheet gives you information about how to care for yourself after your procedure. Your health care provider may also give you more specific instructions. If you have problems or questions, contact your health care provider. What can I expect after the procedure? After the procedure, it is common to have: Sleepiness for several hours. Impaired judgment for several hours. Difficulty with balance. Vomiting if you eat too soon. Follow these instructions at home: For the time period you were told by your health care provider: Rest. Do not participate in activities where you could fall or become injured. Do not drive or use machinery. Do not drink alcohol. Do not take sleeping pills or medicines that cause drowsiness. Do not make important decisions or sign legal documents. Do not take care of children on your own.      Eating and drinking Follow the diet recommended by your health care provider. Drink enough fluid to keep your urine pale yellow. If you vomit: Drink water, juice, or soup when you can drink without vomiting. Make sure you have little or no nausea before eating solid foods.   General instructions Take over-the-counter and prescription medicines only as told by your health care provider. Have a responsible adult stay with you for the time you are told.   It is important to have someone help care for you until you are awake and alert. Do not smoke. Keep all follow-up visits as told by your health care provider. This is important. Contact a health care provider if: You are still sleepy or having trouble with balance after 24 hours. You feel  light-headed. You keep feeling nauseous or you keep vomiting. You develop a rash. You have a fever. You have redness or swelling around the IV site. Get help right away if: You have trouble breathing. You have new-onset confusion at home. Summary After the procedure, it is common to feel sleepy, have impaired judgment, or feel nauseous if you eat too soon. Rest after you get home. Know the things you should not do after the procedure. Follow the diet recommended by your health care provider and drink enough fluid to keep your urine pale yellow. Get help right away if you have trouble breathing or new-onset confusion at home. This information is not intended to replace advice given to you by your health care provider. Make sure you discuss any questions you have with your health care provider. Document Revised: 04/22/2019 Document Reviewed: 11/18/2018 Elsevier Patient Education  2021 Elsevier Inc.     

## 2020-11-23 NOTE — H&P (Addendum)
Chief Complaint: Right clavicle lytic lesion. Request is for right clavicle lytic lesion biopsy  Referring Physician(s): Lincoln Brigham  Supervising Physician: Markus Daft  Patient Status: [facility] - Out-pt  History of Present Illness: Jordan Gregory is a 65 y.o. female outpatient.History of HTN, plasmacytomas without multiple myeloma. Found to have right clavicle lytic lesson seen on prior sternal X-ray from 7.22. and  MRI from 10.12.22). DR Bone Survey Met from 11.1.22 reads Loss of cortex involving the medial right clavicle corresponding to the mass seen on prior MRI in the medial right clavicle. Patient was also found to have with high lambda light chain and proteinuria. Team is requesting clavicle mass biopsy for further evaluation of plasmocytoma.   Currently without any significant complaints.  Endorses fatigue that is worse with exertion. Right sciatica pain that is baseline with occasional right thigh muscle cramps and slight discomfort to right shoulder. Patient alert and laying in bed, calm and comfortable. Denies any fevers, headache, chest pain, SOB, cough, abdominal pain, nausea, vomiting or bleeding. Return precautions and treatment recommendations and follow-up discussed with the patient who is agreeable with the plan.    Past Medical History:  Diagnosis Date   Hypertension    Insomnia    Thyroid disease    Vitamin D deficiency     Past Surgical History:  Procedure Laterality Date   MOHS SURGERY  2016    Allergies: Ramipril  Medications: Prior to Admission medications   Medication Sig Start Date End Date Taking? Authorizing Provider  calcium carbonate (OS-CAL) 600 MG TABS Take 600 mg by mouth daily.    [provider]  levothyroxine (SYNTHROID) 75 MCG tablet Take 1 tablet (75 mcg total) by mouth daily before breakfast. 02/16/20   Nafziger, Tommi Rumps, NP  losartan (COZAAR) 100 MG tablet TAKE 1 TABLET BY MOUTH EVERY DAY 01/26/20   Nafziger, Tommi Rumps, NP   methylPREDNISolone (MEDROL DOSEPAK) 4 MG TBPK tablet Take as directed 09/21/20   Dorothyann Peng, NP  traZODone (DESYREL) 50 MG tablet TAKE 0.5-1 TABLETS BY MOUTH AT BEDTIME AS NEEDED FOR SLEEP. 09/25/20   Nafziger, Tommi Rumps, NP  VITAMIN D, CHOLECALCIFEROL, PO Take 2,000 Int'l Units by mouth daily.    [provider]     Family History  Problem Relation Age of Onset   Arthritis Mother    Hypertension Mother    Dementia Mother    Heart disease Father    Cancer Father 45       Liver    Social History   Socioeconomic History   Marital status: Married    Spouse name: Not on file   Number of children: Not on file   Years of education: Not on file   Highest education level: Not on file  Occupational History   Not on file  Tobacco Use   Smoking status: Never   Smokeless tobacco: Never  Vaping Use   Vaping Use: Never used  Substance and Sexual Activity   Alcohol use: Yes    Alcohol/week: 5.0 standard drinks    Types: 5 Standard drinks or equivalent per week   Drug use: No   Sexual activity: Yes    Birth control/protection: Post-menopausal    Comment: 1st intercourse 2 yo-5 partners  Other Topics Concern   Not on file  Social History Narrative   She works at [employer] and [employer].    Married - 23 years    No kids       She likes to travel, read, cycle,  be outside.    Social Determinants of Health   Financial Resource Strain: Not on file  Food Insecurity: Not on file  Transportation Needs: No Transportation Needs   Lack of Transportation (Medical): No   Lack of Transportation (Non-Medical): No  Physical Activity: Not on file  Stress: Not on file  Social Connections: Not on file    Review of Systems: A 12 point ROS discussed and pertinent positives are indicated in the HPI above.  All other systems are negative.  Review of Systems  Constitutional:  Positive for fatigue. Negative for fever.  HENT:  Negative for congestion.   Respiratory:  Negative for cough and  shortness of breath.   Gastrointestinal:  Negative for abdominal pain, diarrhea, nausea and vomiting.  Musculoskeletal:  Positive for arthralgias (right shoulder) and myalgias (right thigh.).  Neurological:        Sciatica to right right leg radiating down.   Vital Signs: LMP 03/04/2011   Physical Exam Vitals and nursing note reviewed.  Constitutional:      Appearance: She is well-developed.  HENT:     Head: Normocephalic and atraumatic.  Eyes:     Conjunctiva/sclera: Conjunctivae normal.  Cardiovascular:     Rate and Rhythm: Normal rate and regular rhythm.     Heart sounds: Normal heart sounds.  Pulmonary:     Effort: Pulmonary effort is normal.     Breath sounds: Normal breath sounds.  Musculoskeletal:        General: Normal range of motion.     Cervical back: Normal range of motion.  Skin:    General: Skin is warm.  Neurological:     Mental Status: She is alert and oriented to person, place, and time.    Imaging: DG Bone Survey Met  Result Date: 11/06/2020 CLINICAL DATA:  evaluate for lytic lesions EXAM: METASTATIC BONE SURVEY COMPARISON:  MRI sternum and lumbar spine 10/15/2020 FINDINGS: There is a posterior left 2nd rib lytic/destructive process noted with associated soft tissue mass. Probable lytic/destructive process/lesion in the anterolateral left 4th rib. Large mass lesion projected over the left mid lung measuring up to 10 cm. It is difficult to determine if this is a pulmonary mass or soft tissue mass related to a rib lesion. Loss of cortex involving the medial right clavicle corresponding to the mass seen on prior MRI in the medial right clavicle. Soft tissue mass projects over the right lateral lower chest, possibly associated with a right lateral 7th rib lytic lesion. Lytic expansile lesion seen in the right inferior pubic ramus extending into the inferior acetabulum. IMPRESSION: Multiple bilateral rib lytic/destructive lesions with associated soft tissue masses  peripherally. There is a large left mid lung soft tissue mass. It is difficult to determine if this is a primary pulmonary mass lesion or arising from a rib. Recommend chest CT with IV contrast for further evaluation. Expansile lytic lesion in the medial right clavicle head. Expansile lytic lesion in the right inferior pubic ramus extending into the inferior right acetabulum. These lesions are most compatible with metastases or myeloma. Electronically Signed   By: Rolm Baptise M.D.   On: 11/06/2020 19:54    Labs:  CBC: Recent Labs    12/16/19 0855 10/23/20 1536 11/07/20 0816  WBC 3.2* 3.0* 2.6*  HGB 13.0 10.1* 8.8*  HCT 38.0 30.3* 25.7*  PLT 186 197 156    COAGS: No results for input(s): INR, APTT in the last 8760 hours.  BMP: Recent Labs    12/16/19 0855  10/23/20 1536 10/23/20 1809  NA 141 140 138  K 4.8 4.3 4.8  CL 107 106 106  CO2 27 21* 22  GLUCOSE 109* 141* 106*  BUN 21 38* 44*  CALCIUM 9.4 9.8 9.7  CREATININE 0.84 3.49* 3.30*  GFRNONAA 73 14* 15*  GFRAA 85  --   --     LIVER FUNCTION TESTS: Recent Labs    12/16/19 0855 10/23/20 1536  BILITOT 0.7 0.4  AST 22 16  ALT 28 12  ALKPHOS  --  74  PROT 6.9 8.1  ALBUMIN  --  4.1      Assessment and Plan:  65 y.o. female outpatient. History of HTN, plasmacytomas without multiple myeloma. Found to have right clavicle lytic lesson seen on prior sternal X-ray from 7.22. and  MRI from 10.12.22). DR Bone Survey Met from 11.1.22 reads Loss of cortex involving the medial right clavicle corresponding to the mass seen on prior MRI in the medial right clavicle. Patient was also found to have with high lambda light chain and proteinuria. Team is requesting clavicle mass biopsy for further evaluation of plasmocytoma.   Labs from 11.2.22 shows WBC 2.6. All other labs and medications are within acceptable parameters. No pertinent allergies. Patient has been NPO since midnight.   Risks and benefits of right clavicle mass biopsy   was discussed with the patient and/or patient's family including, but not limited to bleeding, infection, damage to adjacent structures or low yield requiring additional tests.  All of the questions were answered and there is agreement to proceed.  Consent signed and in chart.   Thank you for this interesting consult.  I greatly enjoyed meeting Keelan Pomerleau Bena and look forward to participating in their care.  A copy of this report was sent to the requesting provider on this date.  Electronically Signed: Jacqualine Mau, NP 11/23/2020, 11:29 AM   I spent a total of  30 Minutes   in face to face in clinical consultation, greater than 50% of which was counseling/coordinating care for right clavicle lesion biopsy

## 2020-11-27 ENCOUNTER — Ambulatory Visit (HOSPITAL_COMMUNITY)
Admission: RE | Admit: 2020-11-27 | Discharge: 2020-11-27 | Disposition: A | Discharge status: Home / Self Care | Payer: 59 | Source: Ambulatory Visit | Attending: Hematology and Oncology | Admitting: Hematology and Oncology

## 2020-11-27 ENCOUNTER — Other Ambulatory Visit: Payer: Self-pay

## 2020-11-27 DIAGNOSIS — C9030 Solitary plasmacytoma not having achieved remission: Secondary | ICD-10-CM | POA: Diagnosis present

## 2020-11-27 LAB — GLUCOSE, CAPILLARY: Glucose-Capillary: 108 mg/dL — ABNORMAL HIGH (ref 70–99)

## 2020-11-27 IMAGING — CT NM PET TUM IMG INITIAL (PI) SKULL BASE T - THIGH
8 series · 25 of 25 positions shown · non-contrast
Comparison: MRI [DATE]

CLINICAL DATA: Initial treatment strategy for plasmacytoma.
Multiple myeloma

EXAM:
NUCLEAR MEDICINE PET SKULL BASE TO THIGH
TECHNIQUE: 8.8 mCi F-18 FDG was injected intravenously. Full-ring PET imaging
was performed from the skull base to thigh after the radiotracer. CT
data was obtained and used for attenuation correction and anatomic
localization.
Fasting blood glucose: 108 mg/dl

[Series 3: pet sk_thigh ac · axial · 5.0mm · 4.07mm/px · z∈[-1514,-578]mm · 5 of 235 slices shown]
[im 1/235]
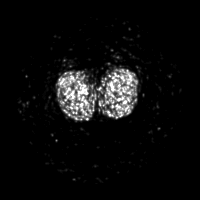
[im 59/235]
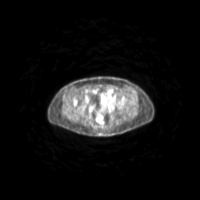
[im 118/235]
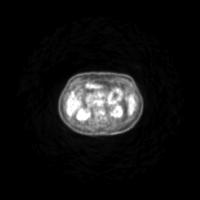
[im 176/235]
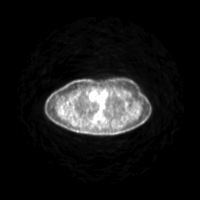
[im 235/235]
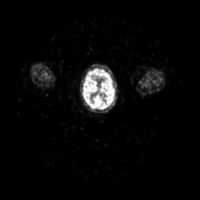

[Series 4: ct sk_thigh 5.0 bf37 · axial · 5.0mm · 0.98mm/px · z∈[-1514,-578]mm · 5 of 235 slices shown]
[im 1/235]
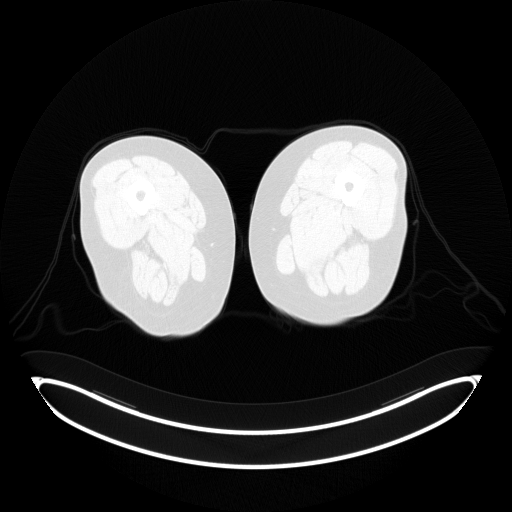
[im 59/235]
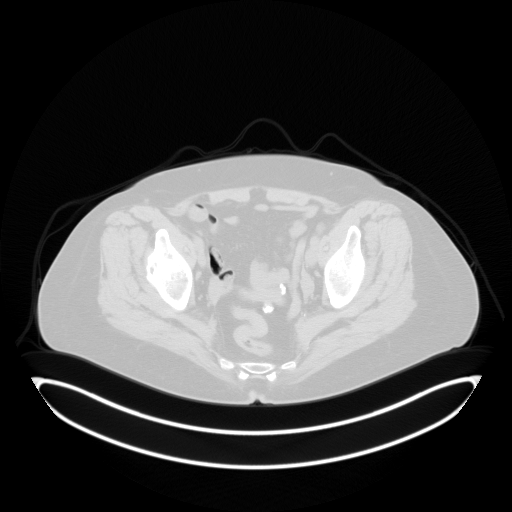
[im 118/235]
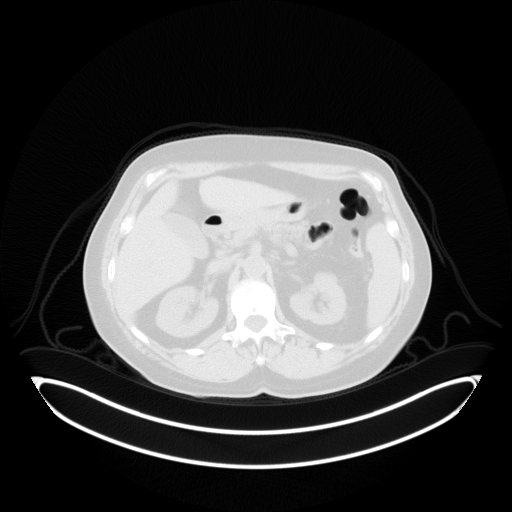
[im 176/235]
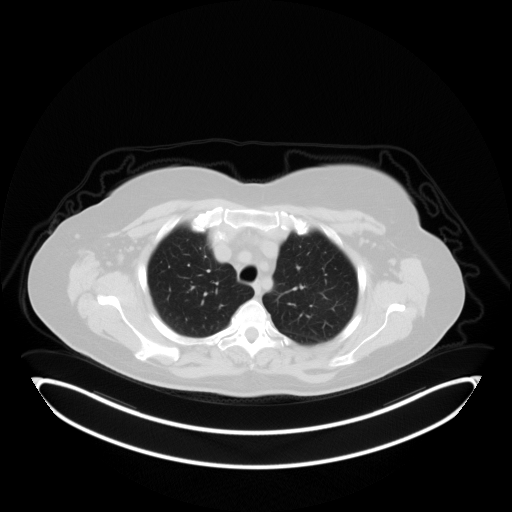
[im 235/235  brain]
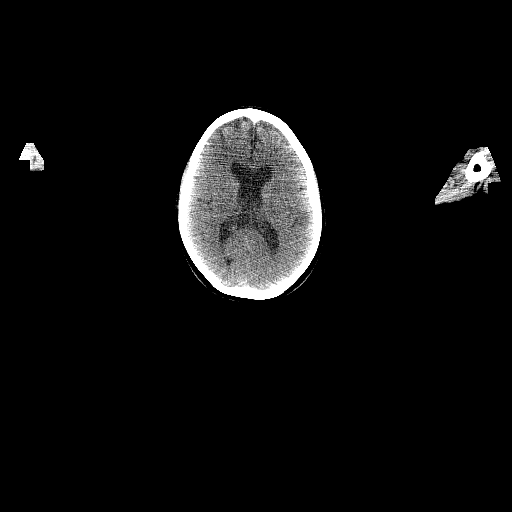

[Series 5: pet sk_thigh nac · axial · 5.0mm · 4.07mm/px · z∈[-1514,-578]mm · 5 of 235 slices shown]
[im 1/235]
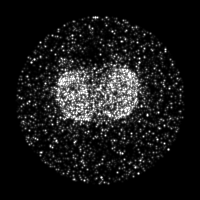
[im 59/235]
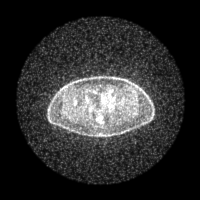
[im 118/235]
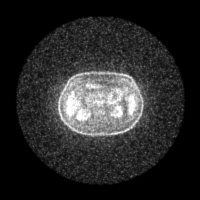
[im 176/235]
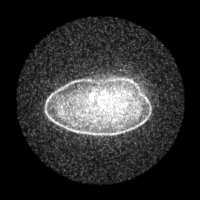
[im 235/235]
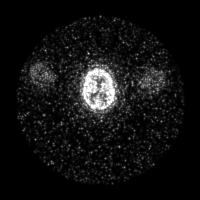

[Series 8: ct sk_thigh 5.0 br59 lung_bone · axial · 5.0mm · 0.64mm/px · z∈[-1012,-748]mm · 2 of 67 slices shown]
[im 1/67]
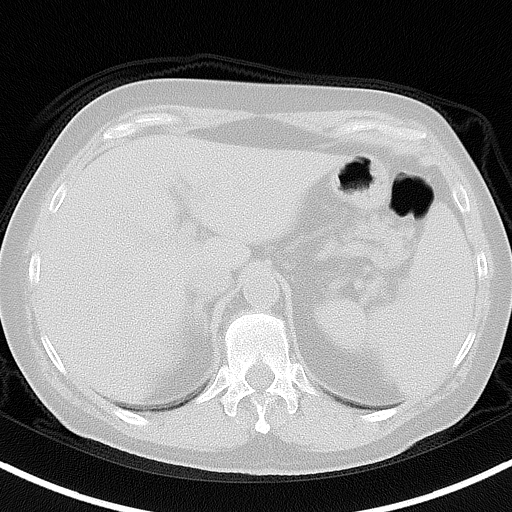
[im 67/67]
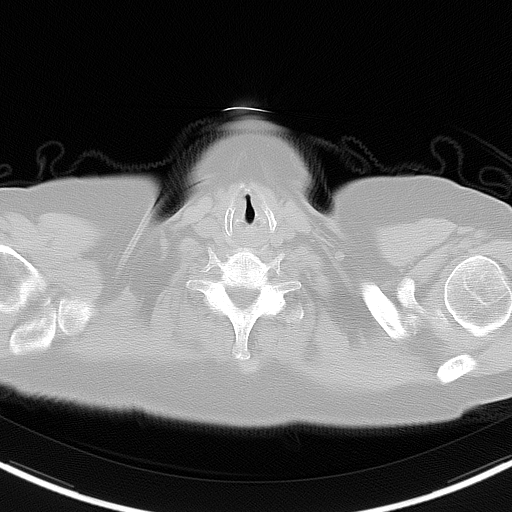

[Series 603: fused cor · 1 of 47 slices shown]
[im 1/47]
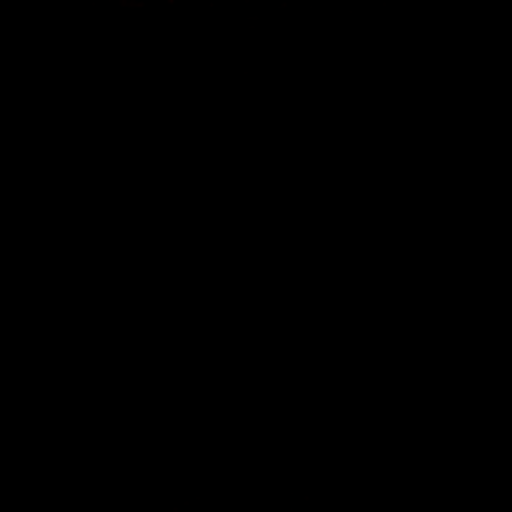

[Series 604: <mip collection> · coronal · 1.94mm/px · 1 of 32 slices shown]
[im 1/32]
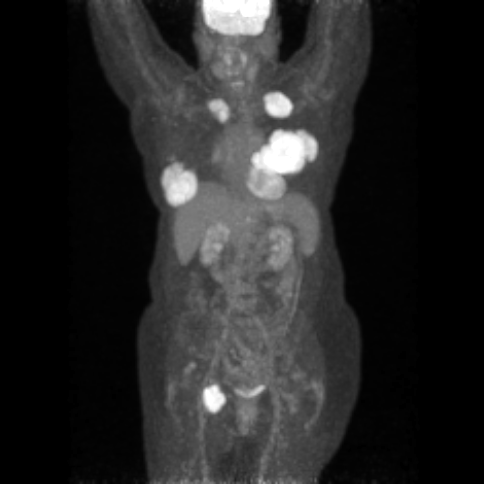

[Series 605: range-ct sk_thigh 5.0 bf37-tra-<alpha range> · 5 of 228 slices shown]
[im 1/228]
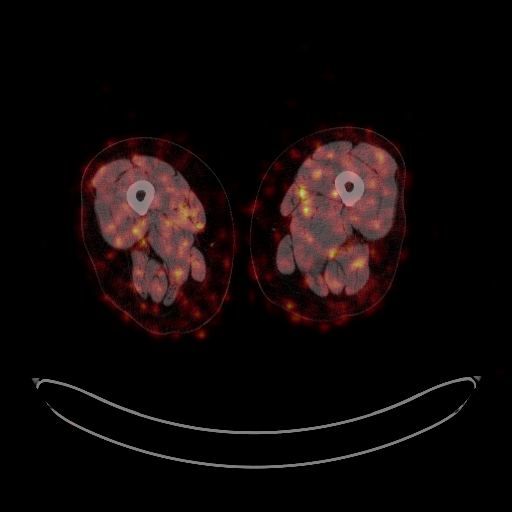
[im 57/228]
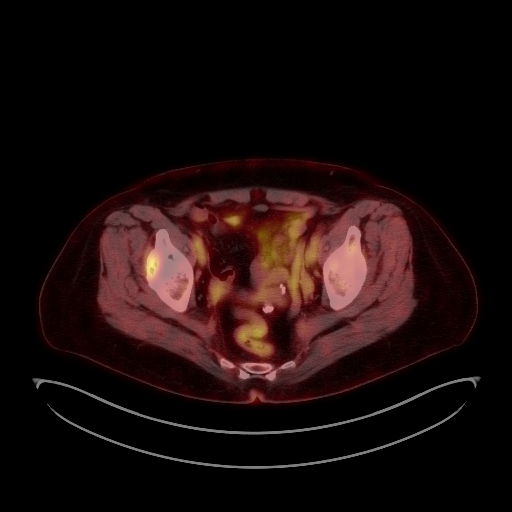
[im 114/228]
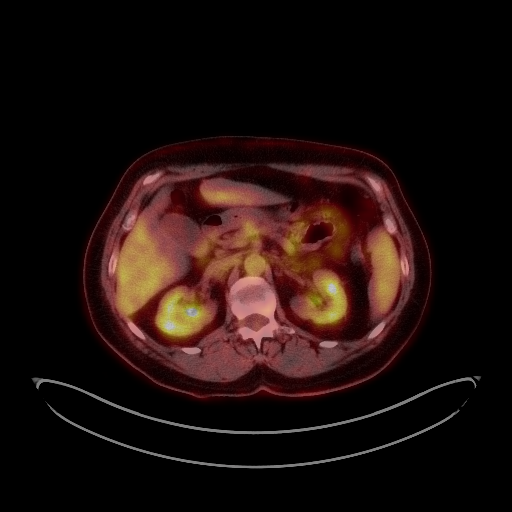
[im 171/228]
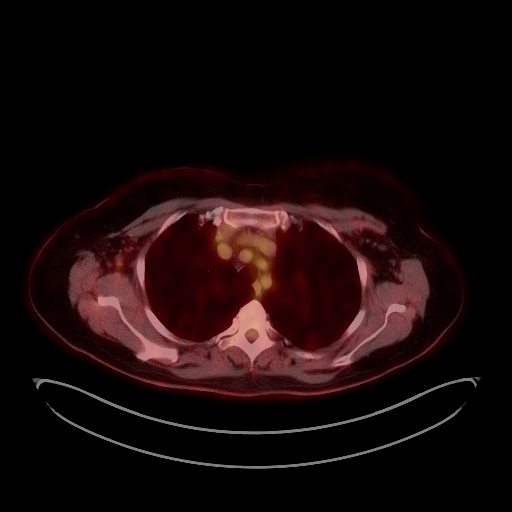
[im 228/228]
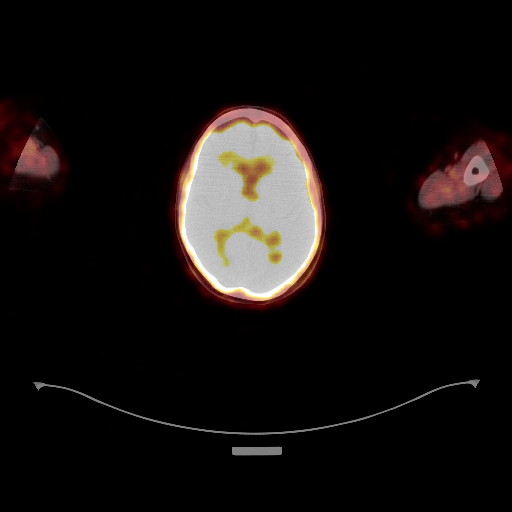

[Series 1080: results mm oncology reading · 1.0mm · 1.00mm/px · 1 of 6 slices shown]
[im 1/6]
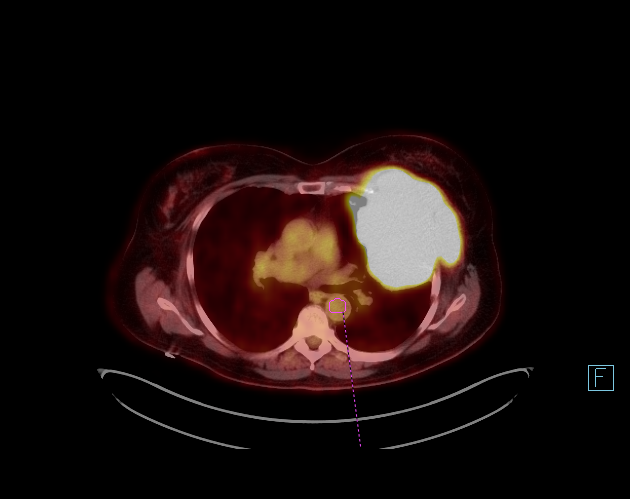

[25 of 25 positions shown; findings below may reference images not displayed]

FINDINGS: Mediastinal blood pool activity: SUV max

Liver activity: SUV max NA

NECK: Intense metabolic activity associated with the lytic lesion in
the medial RIGHT clavicle with SUV max equal 8.7.

No hypermetabolic cervical lymph nodes.

Incidental CT findings:

CHEST: Large expansile solid mass emanating from the LEFT second rib
measures 5.3 x 3.8 cm and has intense metabolic activity SUV max
equal 12.4.

Large expansile solid mass in the anterior LEFT chest wall measures
10.2 x 9.9 cm with SUV max equal 19.3. This mass emanates from the
LEFT fourth rib.

Similar expansile mass originating from the RIGHT seventh rib
measures 6.2 x 7.5 cm with SUV max equal

Incidental CT findings: No hypermetabolic mediastinal lymph nodes.
No hypermetabolic pulmonary nodules.

ABDOMEN/PELVIS: No abnormal hypermetabolic activity within the
liver, pancreas, adrenal glands, or spleen. No hypermetabolic lymph
nodes in the abdomen or pelvis.

Incidental CT findings: Exophytic calcified fibroids adjacent to the
uterine body.

SKELETON: Intensely hypermetabolic expansile skeletal masses
originating from the LEFT and RIGHT ribs described in chest section.
Additional hypermetabolic mass in the medial LEFT clavicle.

Hypermetabolic mass in the inferior RIGHT ischium measures 3.6 cm
SUV max equal 66.

Incidental CT findings: Number
IMPRESSION: 1. Intensely hypermetabolic large soft tissue masses arising from
the LEFT and RIGHT ribs. Two large lesions in the LEFT chest wall
and a single large lesion in the RIGHT chest wall.
2. Hypermetabolic expansile solid masses arising from the medial
RIGHT clavicle and RIGHT inferior pubic ischium.
3. No hypermetabolic lymph nodes.  No pulmonary nodules.

These results will be called to the ordering clinician or
representative by the Radiologist Assistant, and communication
documented in the PACS or [REDACTED].

## 2020-11-27 MED ORDER — FLUDEOXYGLUCOSE F - 18 (FDG) INJECTION
9.0000 | Freq: Once | INTRAVENOUS | Status: AC
  Administered 2020-11-27: 8.8 via INTRAVENOUS

## 2020-11-28 LAB — SURGICAL PATHOLOGY

## 2020-11-30 ENCOUNTER — Telehealth: Payer: Self-pay | Admitting: Physician Assistant

## 2020-11-30 NOTE — Telephone Encounter (Signed)
I called and spoke to Jordan Gregory to review the PET scan and biopsy results. PET scan shows multiple soft tissue masses arising from the ribs, chest wall, right clavicle and right inferior pubic ischium. No evidence of involvement in the lymph nodes or other organs.   Biopsy of the mass involving the right clavicle revealed plasma cell neoplasm. Since bone marrow biopsy was negative, findings are consistent with plasmacytoma.   Patient is scheduled for a consultation with Dr. Lisbeth Renshaw on 12/04/2020 to discuss radiation therapy. We will follow up if systemic therapy is needed after discussion with Dr. Lisbeth Renshaw.   Patient expressed understanding and satisfaction with the plan provided. She is aware to reach back to the clinic if she has any additional questions.

## 2020-12-03 NOTE — Progress Notes (Signed)
Histology and Location of Primary Cancer: Plasmacytoma without multiple myeloma  Sites of Visceral and Bony Metastatic Disease: Medial Clavicle 5.1 x 2.7 x 4.1 cm  Patient presented to her PCP with right clavicle pain and swelling for one week in April 2022. Xray was obtained that  showed mild right acromioclavicular joint osteoarthritis with no acute bony abnormality. Patient continued to have pain and followed up with her PCP in September 2022. MRI was obtained on 10/15/2020 that showed pathologic fracture the medial clavicle associated with a 5.1 x 2.7 x 4.1 cm mass of the medial clavicle.   PET 11/27/2020: Intensely hypermetabolic large soft tissue masses arising from the LEFT and RIGHT ribs. Two large lesions in the LEFT chest wall and a single large lesion in the RIGHT chest wall.  Hypermetabolic expansile solid masses arising from the medial RIGHT clavicle and RIGHT inferior pubic ischium.  MR Sternum 10/15/2020: 30 cc mass of the right medial clavicle with bony destructive findings an underlying pathologic fracture is well as extraosseous extension, causing anterior bulging of the upper pectoralis major muscle and slight posterior displacement of the right for subclavian vein. This mass has high T2 and low T1 signal characteristics and internal heterogeneity in septation raising concern for malignancy, and also is component extending longitudinally in the medullary space of the distal clavicle.  Pathology Report: Medial Right Clavicle 11/23/2020   Past/Anticipated chemotherapy by medical oncology, if any:  Dr. Lorenso Courier 11/09/2020 -The bulk of our discussion focused on the current findings and the concern for multiple myeloma.  Also discussed that these may be plasmacytomas without multiple myeloma.   -In the event that there is no evidence of multiple myeloma would need to proceed with radiation therapy.   -Multiple myeloma is found to need to consider treatment with chemotherapy alone.   -This is a markedly unusual finding with plasmacytomas without multiple myeloma.  There appear to be at least 3 discrete lesions with the largest being in the lung. -Serological studies are confirmatory of a plasma cell neoplasm with markedly high lambda light chains and proteinuria -Bone marrow biopsy shows no evidence of multiple myeloma, implying that these are plasmacytomas without underlying multiple myeloma -We will consult radiation oncology for consideration of radiation to these masses -We will pursue IR guided biopsy of the right clavicular lesion -Patient may require a PET CT scan in order to further evaluate any other possible sites of plasmacytoma.  We will discuss this with radiation oncology. -Follow-up unscheduled at this time   Pain on a scale of 0-10 is:  5-6/10.  Worse pain with bending, stooping.  Pain in the lower back into the hips, occasional rib pain, mild clavicle pain.  Lump on clavicle is not painful to general touch, feels hard.   Ambulatory status? Walker? Wheelchair?: Ambulatory, occasional unsteady on her feet due to the lower back/hip, sciatic pains.  SAFETY ISSUES: Prior radiation? No Pacemaker/ICD? No Possible current pregnancy? Postmenopausal Is the patient on methotrexate? No  Current Complaints / other details:

## 2020-12-04 ENCOUNTER — Other Ambulatory Visit: Payer: Self-pay

## 2020-12-04 ENCOUNTER — Encounter: Payer: Self-pay | Admitting: Radiation Oncology

## 2020-12-04 ENCOUNTER — Ambulatory Visit
Admission: RE | Admit: 2020-12-04 | Discharge: 2020-12-04 | Disposition: A | Discharge status: Home / Self Care | Payer: 59 | Source: Ambulatory Visit | Attending: Radiation Oncology | Admitting: Radiation Oncology

## 2020-12-04 DIAGNOSIS — Z800 Family history of malignant neoplasm of digestive organs: Secondary | ICD-10-CM | POA: Insufficient documentation

## 2020-12-04 DIAGNOSIS — I10 Essential (primary) hypertension: Secondary | ICD-10-CM | POA: Diagnosis not present

## 2020-12-04 DIAGNOSIS — E079 Disorder of thyroid, unspecified: Secondary | ICD-10-CM | POA: Insufficient documentation

## 2020-12-04 DIAGNOSIS — G4730 Sleep apnea, unspecified: Secondary | ICD-10-CM | POA: Insufficient documentation

## 2020-12-04 DIAGNOSIS — C9030 Solitary plasmacytoma not having achieved remission: Secondary | ICD-10-CM | POA: Diagnosis not present

## 2020-12-04 DIAGNOSIS — E559 Vitamin D deficiency, unspecified: Secondary | ICD-10-CM | POA: Insufficient documentation

## 2020-12-04 DIAGNOSIS — Z79899 Other long term (current) drug therapy: Secondary | ICD-10-CM | POA: Diagnosis not present

## 2020-12-04 HISTORY — DX: Solitary plasmacytoma not having achieved remission: C90.30

## 2020-12-04 NOTE — Progress Notes (Signed)
Radiation Oncology         [phone] ________________________________  Name: Jordan Gregory        MRN: [id number]  Date of Service: 12/04/2020 DOB: 23-Oct-1955  WK:[id number], Tommi Rumps, NP  Orson Slick, MD     REFERRING PHYSICIAN: Orson Slick, MD   DIAGNOSIS: The encounter diagnosis was Plasmacytoma not having achieved remission, unspecified plasmacytoma type ([place]).   HISTORY OF PRESENT ILLNESS: Jordan Gregory is a 65 y.o. female seen at the request of Dr. Lorenso Courier for a diagnosis of  multifocal plasmacytomas. The patient presented with pain in the right clavicle originally in the spring 2022.  X-ray imaging at that time showed no acute bony abnormality but changes concerning for osteoarthritis and she continued to have pain and ultimately returned for evaluation in September 2022.  An MRI was performed of the lumbar stenotic spine and sternum on 10/15/2020.  A 3 cm mass in the right medial clavicle with bony destructive findings and underlying pathologic fracture with extraosseous extension was noted causing bulging of the upper pectoralis major slight posterior displacement of the right subclavian vein and her MRI of the lumbar spine showed degenerative changes without lesions or stenosis of the canal.  She underwent an ultrasound-guided biopsy on 11/23/2020 of the clavicle lesion and plasma cell neoplasm was noted on final pathology.  She did undergo a PET scan on 11/27/2020 that showed hypermetabolic soft tissue masses arising in the left chest wall measuring up to 10.2 cm emanating from the left fourth rib, a 7.5 cm expansile mass in the right seventh rib, and additional left clavicular mass, and right ischium mass with an SUV that was also hypermetabolic, this in the right ischium measured 3.6 cm.  She did undergo bone marrow biopsy that showed hypercellular bone marrow with 1% plasma cells and several predominantly small lymphoid aggregates without findings concerning  for myeloma.  Given this situation, she discussed her case with her oncology team and it was recommended that she proceed with radiotherapy as though her disease was still plasmacytoma.  She is seen today to discuss treatment.        PREVIOUS RADIATION THERAPY: No   PAST 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[place])    Thyroid disease    Vitamin D deficiency        PAST SURGICAL HISTORY: Past Surgical History:  Procedure Laterality Date   MOHS SURGERY  2016     FAMILY HISTORY:  Family History  Problem Relation Age of Onset   Arthritis Mother    Hypertension Mother    Dementia Mother    Heart disease Father    Liver cancer Father    Asperger's syndrome Brother    Atrial fibrillation Brother      SOCIAL HISTORY:  reports that she has never smoked. She has never used smokeless tobacco. She reports current alcohol use of about 5.0 standard drinks per week. She reports that she does not use drugs. The patient is married and lives in [place]. She is recently retired. She has also recently navigated the passing of her parents and also is the surrogate decision maker and support system for her adult brother with Asperger's Syndrome.    ALLERGIES: Ramipril   MEDICATIONS:  Current Outpatient Medications  Medication Sig Dispense Refill   calcium carbonate (OS-CAL) 600 MG TABS Take 600 mg by mouth daily.     levothyroxine (  SYNTHROID) 75 MCG tablet Take 1 tablet (75 mcg total) by mouth daily before breakfast. 90 tablet 3   traZODone (DESYREL) 50 MG tablet TAKE 0.5-1 TABLETS BY MOUTH AT BEDTIME AS NEEDED FOR SLEEP. 90 tablet 1   VITAMIN D, CHOLECALCIFEROL, PO Take 2,000 Int'l Units by mouth daily.     losartan (COZAAR) 100 MG tablet TAKE 1 TABLET BY MOUTH EVERY DAY (Patient not taking: Reported on 12/04/2020) 90 tablet 3   No current facility-administered medications for this encounter.     REVIEW OF SYSTEMS:  On review of systems, the patient reports that she has been dealing with a lot of social issues with her brother, and recently closing out her parent's estate which has been stressful just prior to her findings of cancer. She describes persistent weight, perhaps a few pounds of weight gain, and occasionally discomfort in her right collarbone area, occasionally some discomfort in the left chest with sneezing or coughing. Decreased stamina for exercise with mild shortness of breath that resolves with rest, prominent fullness in her right collarbone and left upper chest wall, and right mid lateral chest wall. She's had some low back discomfort, but moreso discomfort in the right hip region, somewhat posteriorly. She has noticed more fatigue as well in the last 7 months. No other complaints are verbalized.      PHYSICAL EXAM:  Wt Readings from Last 3 Encounters:  12/04/20 183 lb (83 kg)  11/09/20 180 lb 12.8 oz (82 kg)  10/23/20 184 lb 4.8 oz (83.6 kg)   Temp Readings from Last 3 Encounters:  12/04/20 (!) 96.7 F (35.9 C) (Temporal)  11/23/20 98 F (36.7 C) (Oral)  11/09/20 98 F (36.7 C) (Oral)   BP Readings from Last 3 Encounters:  12/04/20 (!) 160/81  11/23/20 125/66  11/09/20 (!) 153/81   Pulse Readings from Last 3 Encounters:  12/04/20 88  11/23/20 80  11/09/20 87   Pain Assessment Pain Score: 4  Pain Loc: Back/10  In general this is a well appearing caucasian female in no acute distress. She's alert and oriented x4 and appropriate throughout the examination. Cardiopulmonary assessment is negative for acute distress and she exhibits normal effort but she does tire easily and takes deeper breaths after speaking at times during our discussion. She has prominent fullness in the left chest wall and right clavicle consistent with her disease by PET.     ECOG = 1  0 - Asymptomatic (Fully active, able to carry on all predisease activities without restriction)  1 - Symptomatic but  completely ambulatory (Restricted in physically strenuous activity but ambulatory and able to carry out work of a light or sedentary nature. For example, light housework, office work)  2 - Symptomatic, <50% in bed during the day (Ambulatory and capable of all self care but unable to carry out any work activities. Up and about more than 50% of waking hours)  3 - Symptomatic, >50% in bed, but not bedbound (Capable of only limited self-care, confined to bed or chair 50% or more of waking hours)  4 - Bedbound (Completely disabled. Cannot carry on any self-care. Totally confined to bed or chair)  5 - Death   Eustace Pen MM, Creech RH, Tormey DC, et al. [phone]). "Toxicity and response criteria of the [facility]". [place] Oncol. 5 (6): 649-55    LABORATORY DATA:  Lab Results  Component Value Date   WBC 2.5 (L) 11/23/2020   HGB 9.8 (L) 11/23/2020   HCT  29.9 (L) 11/23/2020   MCV 86.7 11/23/2020   PLT 191 11/23/2020   Lab Results  Component Value Date   NA 138 10/23/2020   K 4.8 10/23/2020   CL 106 10/23/2020   CO2 22 10/23/2020   Lab Results  Component Value Date   ALT 12 10/23/2020   AST 16 10/23/2020   ALKPHOS 74 10/23/2020   BILITOT 0.4 10/23/2020      RADIOGRAPHY: NM PET Image Initial (PI) Skull Base To Thigh  Result Date: 11/28/2020 CLINICAL DATA:  Initial treatment strategy for plasmacytoma. Multiple myeloma EXAM: NUCLEAR MEDICINE PET SKULL BASE TO THIGH TECHNIQUE: 8.8 mCi F-18 FDG was injected intravenously. Full-ring PET imaging was performed from the skull base to thigh after the radiotracer. CT data was obtained and used for attenuation correction and anatomic localization. Fasting blood glucose: 108 mg/dl COMPARISON:  MRI 10/15/2020 FINDINGS: Mediastinal blood pool activity: SUV max 2.0 Liver activity: SUV max NA NECK: Intense metabolic activity associated with the lytic lesion in the medial RIGHT clavicle with SUV max equal 8.7. No hypermetabolic  cervical lymph nodes. Incidental CT findings: CHEST: Large expansile solid mass emanating from the LEFT second rib measures 5.3 x 3.8 cm and has intense metabolic activity SUV max equal 12.4. Large expansile solid mass in the anterior LEFT chest wall measures 10.2 x 9.9 cm with SUV max equal 19.3. This mass emanates from the LEFT fourth rib. Similar expansile mass originating from the RIGHT seventh rib measures 6.2 x 7.5 cm with SUV max equal 12.8 Incidental CT findings: No hypermetabolic mediastinal lymph nodes. No hypermetabolic pulmonary nodules. ABDOMEN/PELVIS: No abnormal hypermetabolic activity within the liver, pancreas, adrenal glands, or spleen. No hypermetabolic lymph nodes in the abdomen or pelvis. Incidental CT findings: Exophytic calcified fibroids adjacent to the uterine body. SKELETON: Intensely hypermetabolic expansile skeletal masses originating from the LEFT and RIGHT ribs described in chest section. Additional hypermetabolic mass in the medial LEFT clavicle. Hypermetabolic mass in the inferior RIGHT ischium measures 3.6 cm SUV max equal 66. Incidental CT findings: Number IMPRESSION: 1. Intensely hypermetabolic large soft tissue masses arising from the LEFT and RIGHT ribs. Two large lesions in the LEFT chest wall and a single large lesion in the RIGHT chest wall. 2. Hypermetabolic expansile solid masses arising from the medial RIGHT clavicle and RIGHT inferior pubic ischium. 3. No hypermetabolic lymph nodes.  No pulmonary nodules. These results will be called to the ordering clinician or representative by the Radiologist Assistant, and communication documented in the PACS or [employer]. Electronically Signed   By: Suzy Bouchard M.D.   On: 11/28/2020 11:26   DG Bone Survey Met  Result Date: 11/06/2020 CLINICAL DATA:  evaluate for lytic lesions EXAM: METASTATIC BONE SURVEY COMPARISON:  MRI sternum and lumbar spine 10/15/2020 FINDINGS: There is a posterior left 2nd rib lytic/destructive  process noted with associated soft tissue mass. Probable lytic/destructive process/lesion in the anterolateral left 4th rib. Large mass lesion projected over the left mid lung measuring up to 10 cm. It is difficult to determine if this is a pulmonary mass or soft tissue mass related to a rib lesion. Loss of cortex involving the medial right clavicle corresponding to the mass seen on prior MRI in the medial right clavicle. Soft tissue mass projects over the right lateral lower chest, possibly associated with a right lateral 7th rib lytic lesion. Lytic expansile lesion seen in the right inferior pubic ramus extending into the inferior acetabulum. IMPRESSION: Multiple bilateral rib lytic/destructive lesions with associated soft  tissue masses peripherally. There is a large left mid lung soft tissue mass. It is difficult to determine if this is a primary pulmonary mass lesion or arising from a rib. Recommend chest CT with IV contrast for further evaluation. Expansile lytic lesion in the medial right clavicle head. Expansile lytic lesion in the right inferior pubic ramus extending into the inferior right acetabulum. These lesions are most compatible with metastases or myeloma. Electronically Signed   By: Rolm Baptise M.D.   On: 11/06/2020 19:54   [place] CORE BIOPSY (SOFT TISSUE)  Result Date: 11/23/2020 INDICATION: 65 year old with multiple bone lesions. Patient has a large exophytic destructive lesion involving the medial right clavicle. Tissue diagnosis is needed. EXAM: Ultrasound-guided core biopsy of right clavicular mass MEDICATIONS: None. ANESTHESIA/SEDATION: Moderate (conscious) sedation was employed during this procedure. A total of Versed 2.64m and fentanyl 100 mcg was administered intravenously at the order of the provider performing the procedure. Total intra-service moderate sedation time: 13 minutes. Patient's level of consciousness and vital signs were monitored continuously by radiology nurse throughout  the procedure under the supervision of the provider performing the procedure. FLUOROSCOPY TIME:  None COMPLICATIONS: None immediate. PROCEDURE: Informed written consent was obtained from the patient after a thorough discussion of the procedural risks, benefits and alternatives. All questions were addressed. A timeout was performed prior to the initiation of the procedure. The right medial clavicle area was evaluated with ultrasound. This area was prepped with chlorhexidine and sterile field was created. Skin and soft tissues were anesthetized using 1% lidocaine. Small incision was made. Using real-time ultrasound guidance, an 18 gauge core biopsy device was directed into the exophytic lesion. Total of 5 soft tissue core biopsies were obtained and placed in saline. Bandage placed over the puncture site. FINDINGS: Large palpable mass along the medial right clavicle. Ultrasound demonstrates s heterogeneous exophytic lesion that roughly measures 2.4 x 2.0 x 2.9 cm. Soft tissue component is very hypoechoic but there are hyperechoic osseous components. 5 soft tissue core biopsies were obtained. IMPRESSION: Ultrasound-guided core biopsy of the medial right clavicle mass. Electronically Signed   By: AMarkus DaftM.D.   On: 11/23/2020 14:45       IMPRESSION/PLAN: 1. Multifocal Hypermetabolism in the skelton with features only diagnostic of plasmacytoma.Dr. MLisbeth Renshawdiscusses the pathology findings and reviews the nature of plasmacytoma versus multiple myeloma.  While her bone marrow biopsy and blood tests are inconsistent with her radiographic images, Dr. MLisbeth Renshawdiscusses consideration of radiotherapy given the bulky nature of her disease.  [facility] and we will message the team in the malignant heme clinic there.  She is aware that she may need to have additional bone marrow biopsy for further clarification or additional testing.  We outlined the typical scenarios for treating  plasmacytoma with radiotherapy versus a palliative intent for treating myelomatous disease.  If her case cannot be further classified, Dr. MLisbeth Renshawwould be willing to consider higher dose per fraction radiotherapy for better long-term control.  We discussed the risks, benefits, short, and long term effects of radiotherapy, as well as the delivery and logistics of radiotherapy and anticipates a course of between 2 weeks if this was classified as myeloma, or approximately 3 weeks if inconsistencies remain.  [facility] but [place] Social constraints.  We appreciate our social work team reaching  out to the patient to identify additional resources to help her navigate her diagnosis as well as other social issues that are ongoing.  In a visit lasting 90 minutes, greater than 50% of the time was spent face to face discussing the patient's condition, in preparation for the discussion, and coordinating the patient's care.  The above documentation reflects my direct findings during this shared patient visit. Please see the separate note by Dr. Lisbeth Renshaw on this date for the remainder of the patient's plan of care.    Carola Rhine, [facility]   **Disclaimer: This note was dictated with voice recognition software. Similar sounding words can inadvertently be transcribed and this note may contain transcription errors which may not have been corrected upon publication of note.**

## 2020-12-05 ENCOUNTER — Encounter: Payer: Self-pay | Admitting: *Deleted

## 2020-12-05 NOTE — Progress Notes (Signed)
[facility] CSW Psychosocial Distress Screening   Social Work was referred by distress screening protocol. The patient scored a 7 on the Psychosocial Distress Thermometer which indicates moderate distress. Social Work Intern contacted Ms. Barrantes by phone to assess for distress and other psychosocial needs.  ONCBCN DISTRESS SCREENING 12/04/2020  Screening Type Initial Screening  Distress experienced in past week (1-10) 7  Family Problem type Other (comment)  Emotional problem type Depression;Nervousness/Anxiety;Adjusting to illness  Spiritual/Religous concerns type Facing my mortality  Physical Problem type Sleep/insomnia  Other [phone]     Patient reports she is still feeling anxious but that she had a productive consultation with Dr. Lisbeth Renshaw yesterday and feels more confident about the treatment process moving forward. Pt still wants a 2nd opinion since she still doesn't feel she has have a definitive diagnosis.   Patient and Intern discussed common feelings and emotions when being diagnosed with cancer, and the importance of support during treatment; Intern provided empathic listening as patient discussed family dynamics and challenges. Intern provided CSW contact information and encouraged patient to call with any questions or concerns once she has a treatment plan, to talk more about available support services.  Rosary Lively, Social Work Intern Supervised by Gwinda Maine, LCSW

## 2020-12-10 ENCOUNTER — Telehealth: Payer: Self-pay | Admitting: Radiation Oncology

## 2020-12-10 NOTE — Telephone Encounter (Signed)
I called and the patient has not heard from [facility]. I messaged our scheduler to see if she can follow up with the referral placed last week.

## 2020-12-17 ENCOUNTER — Telehealth: Payer: Self-pay | Admitting: Radiation Oncology

## 2020-12-17 NOTE — Telephone Encounter (Signed)
The patient had left me a voicemail about not hearing back from [place].  We have been in touch with their scheduling department and apparently they are still reviewing her case prior to offering an appointment.  I offered to talk to the physician who would be seeing her if that would be of any help.  I also called the patient back and she is interested in moving forward with some concrete plans for radiotherapy especially given how long it is taken to get response from [facility].  We discussed the rationale for simulation.  She is also going to be out of town the week after Christmas for part of the week.  I think is reasonable to wait till that time to start treatment so that she can hopefully get in and be seen by [facility] prior to.  We also discussed 3 weeks of radiotherapy to her bony disease that is felt to be myelomatous but not conclusive by cytology.  She is in agreement with this plan and will be contacted by our simulation staff to come in sometime next week to plan her treatment.  She is also aware that if we needed to make adjustments in this if she was able to be seen by [facility] that we could certainly do this in that timeframe.

## 2020-12-19 ENCOUNTER — Encounter: Payer: Self-pay | Admitting: Adult Health

## 2020-12-19 ENCOUNTER — Ambulatory Visit (INDEPENDENT_AMBULATORY_CARE_PROVIDER_SITE_OTHER): Payer: 59 | Admitting: Adult Health

## 2020-12-19 ENCOUNTER — Telehealth: Payer: Self-pay | Admitting: Radiation Oncology

## 2020-12-19 VITALS — BP 120/82 | HR 44 | Temp 98.0°F | Ht 67.0 in | Wt 181.0 lb

## 2020-12-19 DIAGNOSIS — E039 Hypothyroidism, unspecified: Secondary | ICD-10-CM | POA: Diagnosis not present

## 2020-12-19 DIAGNOSIS — G4700 Insomnia, unspecified: Secondary | ICD-10-CM

## 2020-12-19 DIAGNOSIS — I10 Essential (primary) hypertension: Secondary | ICD-10-CM

## 2020-12-19 DIAGNOSIS — C9030 Solitary plasmacytoma not having achieved remission: Secondary | ICD-10-CM | POA: Diagnosis not present

## 2020-12-19 DIAGNOSIS — Z0000 Encounter for general adult medical examination without abnormal findings: Secondary | ICD-10-CM

## 2020-12-19 DIAGNOSIS — F419 Anxiety disorder, unspecified: Secondary | ICD-10-CM

## 2020-12-19 DIAGNOSIS — F32A Depression, unspecified: Secondary | ICD-10-CM

## 2020-12-19 LAB — LIPID PANEL
Cholesterol: 200 mg/dL (ref 0–200)
HDL: 67.4 mg/dL (ref >39.00)
LDL Cholesterol: 119 mg/dL — ABNORMAL HIGH (ref 0–99)
NonHDL: 132.35
Total CHOL/HDL Ratio: 3
Triglycerides: 69 mg/dL (ref 0.0–149.0)
VLDL: 13.8 mg/dL (ref 0.0–40.0)

## 2020-12-19 LAB — CBC WITH DIFFERENTIAL/PLATELET
Basophils Absolute: 0.1 10*3/uL (ref 0.0–0.1)
Basophils Relative: 2.1 % (ref 0.0–3.0)
Eosinophils Absolute: 0.2 10*3/uL (ref 0.0–0.7)
Eosinophils Relative: 6.5 % — ABNORMAL HIGH (ref 0.0–5.0)
HCT: 31.7 % — ABNORMAL LOW (ref 36.0–46.0)
Hemoglobin: 10.8 g/dL — ABNORMAL LOW (ref 12.0–15.0)
Lymphocytes Relative: 33.8 % (ref 12.0–46.0)
Lymphs Abs: 0.9 10*3/uL (ref 0.7–4.0)
MCHC: 34.1 g/dL (ref 30.0–36.0)
MCV: 85.4 fl (ref 78.0–100.0)
Monocytes Absolute: 0.3 10*3/uL (ref 0.1–1.0)
Monocytes Relative: 12.1 % — ABNORMAL HIGH (ref 3.0–12.0)
Neutro Abs: 1.3 10*3/uL — ABNORMAL LOW (ref 1.4–7.7)
Neutrophils Relative %: 45.5 % (ref 43.0–77.0)
Platelets: 206 10*3/uL (ref 150.0–400.0)
RBC: 3.71 Mil/uL — ABNORMAL LOW (ref 3.87–5.11)
RDW: 13.6 % (ref 11.5–15.5)
WBC: 2.8 10*3/uL — ABNORMAL LOW (ref 4.0–10.5)

## 2020-12-19 LAB — COMPREHENSIVE METABOLIC PANEL
ALT: 14 U/L (ref 0–35)
AST: 17 U/L (ref 0–37)
Albumin: 4.6 g/dL (ref 3.5–5.2)
Alkaline Phosphatase: 56 U/L (ref 39–117)
BUN: 29 mg/dL — ABNORMAL HIGH (ref 6–23)
CO2: 26 mEq/L (ref 19–32)
Calcium: 9.8 mg/dL (ref 8.4–10.5)
Chloride: 101 mEq/L (ref 96–112)
Creatinine, Ser: 2.74 mg/dL — ABNORMAL HIGH (ref 0.40–1.20)
GFR: 17.51 mL/min — ABNORMAL LOW (ref >60.00)
Glucose, Bld: 101 mg/dL — ABNORMAL HIGH (ref 70–99)
Potassium: 4.6 mEq/L (ref 3.5–5.1)
Sodium: 137 mEq/L (ref 135–145)
Total Bilirubin: 0.5 mg/dL (ref 0.2–1.2)
Total Protein: 8.2 g/dL (ref 6.0–8.3)

## 2020-12-19 LAB — TSH: TSH: 2.22 u[IU]/mL (ref 0.35–5.50)

## 2020-12-19 MED ORDER — CITALOPRAM HYDROBROMIDE 10 MG PO TABS: 10.0000 mg | ORAL_TABLET | Freq: Every day | ORAL | 0 refills | Status: DC

## 2020-12-19 NOTE — Telephone Encounter (Signed)
I spoke with the patient. She has an appt at [facility] on 01/12/20 and needs to move her appt for radiation that day. I messaged our staff to see how we can help her with that request.

## 2020-12-19 NOTE — Patient Instructions (Signed)
It was great seeing you today   We will follow up with you regarding your labs   I have sent in a low dose of Celexa to help with anxiety and depression. Let me know how you are doing with this in about a month

## 2020-12-19 NOTE — Progress Notes (Signed)
Subjective:    Patient ID: Jordan Gregory, female    DOB: February 12, 1955, 65 y.o.   MRN: [id number]  HPI Patient presents for yearly preventative medicine examination. 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  Plasmacytoma-found to her radiation from core biopsy after MRI of the sternum which showed a 30 cc mass in the right medial clavicle with bony destructive findings. PET scan on 11/33/2022 showed  IMPRESSION: 1. Intensely hypermetabolic large soft tissue masses arising from the LEFT and RIGHT ribs. Two large lesions in the LEFT chest wall and a single large lesion in the RIGHT chest wall. 2. Hypermetabolic expansile solid masses arising from the medial RIGHT clavicle and RIGHT inferior pubic ischium. 3. No hypermetabolic lymph nodes.  No pulmonary nodules.  She has been referred to [facility] for further treatment but has not heard yet. She plans to start radiation treatment Jan 3rd 2023 x 3 weeks   Hypothyroidism-takes Synthroid 50 mcg daily.  Denies symptoms of hypo or hyperthyroidism  Hypertension-is controlled with Norvasc 10 mg.  This was changed from Cozaar 100 mg by nephrology when she was found to have AKI with a GFR of 15.  She denies dizziness, lightheadedness, chest pain, shortness of breath BP Readings from Last 3 Encounters:  12/19/20 120/82  12/04/20 (!) 160/81  11/23/20 125/66   Insomnia-takes trazodone nightly as needed.  She does feel as though she gets restful sleep when taking.  Anxiety/Depression-more apparent since being diagnosed with cancer.  She feels more stressed out, rightfully so.  Also having to help take care of her brother who has Asperger's.  She is interested in starting something low-dose as well as talking to a therapist.   All immunizations and health maintenance protocols were reviewed with the patient and needed orders were  placed.  Appropriate screening laboratory values were ordered for the patient including screening of hyperlipidemia, renal function and hepatic function.  Medication reconciliation,  past medical history, social history, problem list and allergies were reviewed in detail with the patient  Goals were established with regard to weight loss, exercise, and  diet in compliance with medications   Review of Systems  Constitutional:  Positive for fatigue.  HENT: Negative.    Eyes: Negative.   Respiratory: Negative.    Cardiovascular: Negative.   Gastrointestinal: Negative.   Endocrine: Negative.   Genitourinary: Negative.   Musculoskeletal:  Positive for arthralgias and back pain.  Skin: Negative.   Allergic/Immunologic: Negative.   Neurological: Negative.   Hematological: Negative.   Psychiatric/Behavioral:  Positive for sleep disturbance. The patient is nervous/anxious.    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[place])    Thyroid disease    Vitamin D deficiency     Social History   Socioeconomic History   Marital status: Married    Spouse name: Not on file   Number of children: Not on file   Years of education: Not on file   Highest education level: Not on file  Occupational History   Not on file  Tobacco Use   Smoking status: Never   Smokeless tobacco: Never  Vaping Use   Vaping Use: Never used  Substance and Sexual Activity   Alcohol use: Yes    Alcohol/week: 5.0 standard drinks    Types: 5 Standard drinks or equivalent per week  Comment: regular   Drug use: No   Sexual activity: Yes    Birth control/protection: Post-menopausal    Comment: 1st intercourse 66 yo-5 partners  Other Topics Concern   Not on file  Social History Narrative   She works at [employer] and [employer].    Married - 23 years    No kids       She likes to travel, read, cycle, be outside.    Social Determinants of Health   Financial Resource Strain:  Not on file  Food Insecurity: Not on file  Transportation Needs: No Transportation Needs   Lack of Transportation (Medical): No   Lack of Transportation (Non-Medical): No  Physical Activity: Not on file  Stress: Not on file  Social Connections: Not on file  Intimate Partner Violence: Not At Risk   Fear of Current or Ex-Partner: No   Emotionally Abused: No   Physically Abused: No   Sexually Abused: No    Past Surgical History:  Procedure Laterality Date   MOHS SURGERY  2016    Family History  Problem Relation Age of Onset   Arthritis Mother    Hypertension Mother    Dementia Mother    Heart disease Father    Liver cancer Father    Asperger's syndrome Brother    Atrial fibrillation Brother     Allergies  Allergen Reactions   Ramipril Cough    Current Outpatient Medications on File Prior to Visit  Medication Sig Dispense Refill   amLODipine (NORVASC) 10 MG tablet Take 10 mg by mouth daily.     calcium carbonate (OS-CAL) 600 MG TABS Take 600 mg by mouth daily.     levothyroxine (SYNTHROID) 75 MCG tablet Take 1 tablet (75 mcg total) by mouth daily before breakfast. 90 tablet 3   traZODone (DESYREL) 50 MG tablet TAKE 0.5-1 TABLETS BY MOUTH AT BEDTIME AS NEEDED FOR SLEEP. 90 tablet 1   VITAMIN D, CHOLECALCIFEROL, PO Take 2,000 Int'l Units by mouth daily.     No current facility-administered medications on file prior to visit.    BP 120/82    Pulse (!) 44    Temp 98 F (36.7 C) (Oral)    Ht 5\' 7"  (1.702 m)    Wt 181 lb (82.1 kg)    LMP 03/04/2011    SpO2 100%    BMI 28.35 kg/m        Objective:   Physical Exam Vitals and nursing note reviewed.  Constitutional:      General: She is not in acute distress.    Appearance: Normal appearance. She is well-developed. She is not ill-appearing.  HENT:     Head: Normocephalic and atraumatic.     Right Ear: Tympanic membrane, ear canal and external ear normal. There is no impacted cerumen.     Left Ear: Tympanic membrane,  ear canal and external ear normal. There is no impacted cerumen.     Nose: Nose normal. No congestion or rhinorrhea.     Mouth/Throat:     Mouth: Mucous membranes are moist.     Pharynx: Oropharynx is clear. No oropharyngeal exudate or posterior oropharyngeal erythema.  Eyes:     General:        Right eye: No discharge.        Left eye: No discharge.     Extraocular Movements: Extraocular movements intact.     Conjunctiva/sclera: Conjunctivae normal.     Pupils: Pupils are equal, round, and reactive to light.  Neck:  Thyroid: No thyromegaly.     Vascular: No carotid bruit.     Trachea: No tracheal deviation.  Cardiovascular:     Rate and Rhythm: Normal rate and regular rhythm.     Pulses: Normal pulses.     Heart sounds: Normal heart sounds. No murmur heard.   No friction rub. No gallop.  Pulmonary:     Effort: Pulmonary effort is normal. No respiratory distress.     Breath sounds: Normal breath sounds. No stridor. No wheezing, rhonchi or rales.  Chest:     Chest wall: No tenderness.  Abdominal:     General: Abdomen is flat. Bowel sounds are normal. There is no distension.     Palpations: Abdomen is soft. There is no mass.     Tenderness: There is no abdominal tenderness. There is no right CVA tenderness, left CVA tenderness, guarding or rebound.     Hernia: No hernia is present.  Musculoskeletal:        General: No swelling, tenderness, deformity or signs of injury. Normal range of motion.     Cervical back: Normal range of motion and neck supple.     Right lower leg: No edema.     Left lower leg: No edema.     Comments: Deformity noted on right clavicle    Lymphadenopathy:     Cervical: No cervical adenopathy.  Skin:    General: Skin is warm and dry.     Coloration: Skin is not jaundiced or pale.     Findings: No bruising, erythema, lesion or rash.  Neurological:     General: No focal deficit present.     Mental Status: She is alert and oriented to person, place,  and time.     Cranial Nerves: No cranial nerve deficit.     Sensory: No sensory deficit.     Motor: No weakness.     Coordination: Coordination normal.     Gait: Gait normal.     Deep Tendon Reflexes: Reflexes normal.  Psychiatric:        Mood and Affect: Mood normal.        Behavior: Behavior normal.        Thought Content: Thought content normal.        Judgment: Judgment normal.      Assessment & Plan:  1. Routine general medical examination at a health care facility - Follow up in one year or sooner if needed  - CBC with Differential/Platelet; Future - Lipid panel; Future - Comprehensive metabolic panel; Future - TSH; Future - TSH - Comprehensive metabolic panel - Lipid panel - CBC with Differential/Platelet  2. Primary hypertension - Continue with Norvasc.  - Will recheck kidney function today  - CBC with Differential/Platelet; Future - Lipid panel; Future - Comprehensive metabolic panel; Future - TSH; Future - TSH - Comprehensive metabolic panel - Lipid panel - CBC with Differential/Platelet  3. Hypothyroidism, unspecified type - consider increase in synthroid  4. Plasmacytoma not having achieved remission, unspecified plasmacytoma type ([place]) - Follow up with Oncology as directed  5. Insomnia, unspecified type - Continue Trazodone   6. Anxiety and depression - Will start on Celexa 10 mg to help with anxiety and d - citalopram (CELEXA) 10 MG tablet; Take 1 tablet (10 mg total) by mouth daily.  Dispense: 90 tablet; Refill: 0  Dorothyann Peng, NP

## 2020-12-20 ENCOUNTER — Telehealth: Payer: Self-pay | Admitting: *Deleted

## 2020-12-20 NOTE — Telephone Encounter (Signed)
Called patient to inform of appt.with Dr. Maylene Roes @ [place] on 01-11-21- arrival time- 11:30 am, spoke with Ms. Siciliano and she is aware of this appt.

## 2020-12-25 ENCOUNTER — Telehealth: Payer: Self-pay | Admitting: Radiation Oncology

## 2020-12-25 NOTE — Telephone Encounter (Signed)
The patient called to let us know she will proceed with chemo rather than radiation. We will cancel her appts. I've reached out to Dr. Lorenso Courier about her course and we will be happy to see her if she needs any salvage xrt or for palliative purposes.

## 2020-12-26 ENCOUNTER — Ambulatory Visit: Payer: 59 | Admitting: Radiation Oncology

## 2020-12-26 ENCOUNTER — Other Ambulatory Visit: Payer: Self-pay | Admitting: Adult Health

## 2020-12-26 DIAGNOSIS — I159 Secondary hypertension, unspecified: Secondary | ICD-10-CM

## 2021-01-02 ENCOUNTER — Other Ambulatory Visit: Payer: Self-pay | Admitting: Hematology and Oncology

## 2021-01-08 ENCOUNTER — Other Ambulatory Visit: Payer: Self-pay | Admitting: Physician Assistant

## 2021-01-08 ENCOUNTER — Telehealth: Payer: Self-pay | Admitting: Hematology and Oncology

## 2021-01-08 ENCOUNTER — Ambulatory Visit: Payer: 59 | Admitting: Radiation Oncology

## 2021-01-08 DIAGNOSIS — C9030 Solitary plasmacytoma not having achieved remission: Secondary | ICD-10-CM

## 2021-01-08 NOTE — Telephone Encounter (Signed)
Scheduled per 12/28 scheduled message, patient has been called and notified of all upcoming appointments.

## 2021-01-08 NOTE — Telephone Encounter (Signed)
Scheduled per 01/03 scheduled message, patient has been called and notified of all upcoming appointments.

## 2021-01-09 ENCOUNTER — Inpatient Hospital Stay: Payer: 59

## 2021-01-09 ENCOUNTER — Ambulatory Visit: Payer: 59

## 2021-01-09 ENCOUNTER — Other Ambulatory Visit: Payer: Self-pay

## 2021-01-09 ENCOUNTER — Telehealth: Payer: Self-pay

## 2021-01-09 ENCOUNTER — Ambulatory Visit: Payer: 59 | Admitting: Physician Assistant

## 2021-01-09 ENCOUNTER — Other Ambulatory Visit: Payer: 59

## 2021-01-09 ENCOUNTER — Inpatient Hospital Stay: Payer: 59 | Attending: Physician Assistant

## 2021-01-09 ENCOUNTER — Inpatient Hospital Stay (HOSPITAL_BASED_OUTPATIENT_CLINIC_OR_DEPARTMENT_OTHER): Payer: 59 | Admitting: Physician Assistant

## 2021-01-09 VITALS — BP 162/76 | HR 89 | Temp 97.7°F | Resp 18 | Wt 183.1 lb

## 2021-01-09 DIAGNOSIS — Z79899 Other long term (current) drug therapy: Secondary | ICD-10-CM | POA: Insufficient documentation

## 2021-01-09 DIAGNOSIS — Z5111 Encounter for antineoplastic chemotherapy: Secondary | ICD-10-CM | POA: Insufficient documentation

## 2021-01-09 DIAGNOSIS — Z5112 Encounter for antineoplastic immunotherapy: Secondary | ICD-10-CM | POA: Diagnosis present

## 2021-01-09 DIAGNOSIS — C9030 Solitary plasmacytoma not having achieved remission: Secondary | ICD-10-CM

## 2021-01-09 DIAGNOSIS — C9000 Multiple myeloma not having achieved remission: Secondary | ICD-10-CM

## 2021-01-09 LAB — CBC WITH DIFFERENTIAL (CANCER CENTER ONLY)
Abs Immature Granulocytes: 0.01 10*3/uL (ref 0.00–0.07)
Basophils Absolute: 0 10*3/uL (ref 0.0–0.1)
Basophils Relative: 1 %
Eosinophils Absolute: 0.2 10*3/uL (ref 0.0–0.5)
Eosinophils Relative: 7 %
HCT: 29.3 % — ABNORMAL LOW (ref 36.0–46.0)
Hemoglobin: 10.1 g/dL — ABNORMAL LOW (ref 12.0–15.0)
Immature Granulocytes: 0 %
Lymphocytes Relative: 34 %
Lymphs Abs: 0.9 10*3/uL (ref 0.7–4.0)
MCH: 28.8 pg (ref 26.0–34.0)
MCHC: 34.5 g/dL (ref 30.0–36.0)
MCV: 83.5 fL (ref 80.0–100.0)
Monocytes Absolute: 0.3 10*3/uL (ref 0.1–1.0)
Monocytes Relative: 12 %
Neutro Abs: 1.2 10*3/uL — ABNORMAL LOW (ref 1.7–7.7)
Neutrophils Relative %: 46 %
Platelet Count: 182 10*3/uL (ref 150–400)
RBC: 3.51 MIL/uL — ABNORMAL LOW (ref 3.87–5.11)
RDW: 13.1 % (ref 11.5–15.5)
WBC Count: 2.6 10*3/uL — ABNORMAL LOW (ref 4.0–10.5)
nRBC: 0 % (ref 0.0–0.2)

## 2021-01-09 LAB — CMP (CANCER CENTER ONLY)
ALT: 24 U/L (ref 0–44)
AST: 27 U/L (ref 15–41)
Albumin: 4.5 g/dL (ref 3.5–5.0)
Alkaline Phosphatase: 56 U/L (ref 38–126)
Anion gap: 10 (ref 5–15)
BUN: 36 mg/dL — ABNORMAL HIGH (ref 8–23)
CO2: 24 mmol/L (ref 22–32)
Calcium: 9.6 mg/dL (ref 8.9–10.3)
Chloride: 103 mmol/L (ref 98–111)
Creatinine: 2.94 mg/dL — ABNORMAL HIGH (ref 0.44–1.00)
GFR, Estimated: 17 mL/min — ABNORMAL LOW (ref >60)
Glucose, Bld: 112 mg/dL — ABNORMAL HIGH (ref 70–99)
Potassium: 4.1 mmol/L (ref 3.5–5.1)
Sodium: 137 mmol/L (ref 135–145)
Total Bilirubin: 0.5 mg/dL (ref 0.3–1.2)
Total Protein: 8 g/dL (ref 6.5–8.1)

## 2021-01-09 LAB — LACTATE DEHYDROGENASE: LDH: 215 U/L — ABNORMAL HIGH (ref 98–192)

## 2021-01-09 MED ORDER — DEXAMETHASONE 20 MG PO TABS: 40.0000 mg | ORAL_TABLET | ORAL | 3 refills | Status: DC

## 2021-01-09 MED ORDER — ACYCLOVIR 400 MG PO TABS: 400.0000 mg | ORAL_TABLET | Freq: Two times a day (BID) | ORAL | 2 refills | Status: DC

## 2021-01-09 MED ORDER — BORTEZOMIB CHEMO SQ INJECTION 3.5 MG (2.5MG/ML)
1.5000 mg/m2 | Freq: Once | INTRAMUSCULAR | Status: AC
  Administered 2021-01-09: 3 mg via SUBCUTANEOUS
  Filled 2021-01-09: qty 1.2

## 2021-01-09 MED ORDER — SODIUM CHLORIDE 0.9 % IV SOLN: Freq: Once | INTRAVENOUS | Status: AC

## 2021-01-09 MED ORDER — PROCHLORPERAZINE MALEATE 10 MG PO TABS: 10.0000 mg | ORAL_TABLET | Freq: Four times a day (QID) | ORAL | 2 refills | Status: DC | PRN

## 2021-01-09 MED ORDER — ONDANSETRON HCL 8 MG PO TABS: 8.0000 mg | ORAL_TABLET | Freq: Three times a day (TID) | ORAL | 2 refills | Status: DC | PRN

## 2021-01-09 MED ORDER — SODIUM CHLORIDE 0.9 % IV SOLN
40.0000 mg | Freq: Once | INTRAVENOUS | Status: AC
  Administered 2021-01-09: 40 mg via INTRAVENOUS
  Filled 2021-01-09: qty 4

## 2021-01-09 MED ORDER — PALONOSETRON HCL INJECTION 0.25 MG/5ML
0.2500 mg | Freq: Once | INTRAVENOUS | Status: AC
  Administered 2021-01-09: 0.25 mg via INTRAVENOUS
  Filled 2021-01-09: qty 5

## 2021-01-09 MED ORDER — SODIUM CHLORIDE 0.9 % IV SOLN
300.0000 mg/m2 | Freq: Once | INTRAVENOUS | Status: AC
  Administered 2021-01-09: 600 mg via INTRAVENOUS
  Filled 2021-01-09: qty 30

## 2021-01-09 NOTE — Progress Notes (Signed)
Per Dede Query, PA ok to treat with today's labs

## 2021-01-09 NOTE — Progress Notes (Signed)
Change Dexamethasone IV to Dexamethasone 40mg  PO starting 01/16/21.  Raul Del [place], [place], BCPS, BCOP 01/09/2021 2:01 PM

## 2021-01-09 NOTE — Progress Notes (Signed)
[facility] [phone]   [phone]  PROGRESS NOTE  Patient Care Team: Dorothyann Peng, NP as PCP - General (Family Medicine) Orson Slick, MD as Consulting Physician (Hematology and Oncology) Harriett Sine, MD as Consulting Physician (Dermatology)  Hematological/Oncological History # Plasmacytomas without Multiple Myeloma -10/15/2020: MRI showed pathologic fracture the medial clavicle associated with a 5.1 x 2.7 x 4.1 cm mass  -10/23/2020: Establish care with diagnostic clinic at [facility].  Serological testing concerning for a plasma cell neoplasm, with UPEP showing 7.4 g of protein daily with markedly high lambda light chains -11/07/2020: Bone marrow biopsy performed, shows no evidence of multiple myeloma -11/23/2020: Biopsy of right clavicle mass confirmed plasmacytoma.  -11/27/2020: PET scan show intensely hypermetabolic large soft tissue masses arising from the LEFT and RIGHT ribs. Two large lesions in the LEFT chest wall and a single large lesion in the RIGHT chest wall.Hypermetabolic expansile solid masses arising from the medial RIGHT clavicle and RIGHT inferior pubic ischium -01/09/2021: Cycle 1, Day 1 CyBorD   Interval History:  Jordan Gregory 66 y.o. 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 The patient was last seen by Dr. Lorenso Courier on 11/09/2020. In the interim since the last visit, patient underwent PET scan that showed multiple hypermetabolic lesions consistent with plasmacytomas. Dr. Lorenso Courier discussed case with Dr. Maylene Roes from [facility] who recommended to proceed with systemic chemotherapy due to poor renal function even though there is no evidence of multiple myeloma seen in the bone marrow biopsy. Patient is here to start Cycle 1, Day 1 of CyBorD.   On exam today Jordan Gregory reports that her energy levels are fairly stable since the last  visit.  She does have fatigue but continues to complete her daily activities on her own.  She has a good appetite and denies any weight loss.  Patient denies nausea, vomiting or abdominal pain.  Patient reports intermittent episodes of comfort in the right rib area.  She reports the pain when she is sleeping on that side or changing positions.  She does not require any pain medication at this time.  Patient reports some stiffness and discomfort in both her hips that have been present for a while.  She denies any bowel habit changes including diarrhea or constipation.  Patient denies fevers, chills, night sweats, shortness of breath, chest pain or cough.  She has no other complaints.  Rest of the 10 point ROS is below.  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[place])    Thyroid disease    Vitamin D deficiency     SURGICAL HISTORY: Past Surgical History:  Procedure Laterality Date   MOHS SURGERY  2016    SOCIAL HISTORY: Social History   Socioeconomic History   Marital status: Married    Spouse name: Not on file   Number of children: Not on file   Years of education: Not on file   Highest education level: Not on file  Occupational History   Not on file  Tobacco Use   Smoking status: Never   Smokeless tobacco: Never  Vaping Use   Vaping Use: Never used  Substance and Sexual Activity   Alcohol use: Yes    Alcohol/week: 5.0 standard drinks    Types: 5 Standard drinks or equivalent per week    Comment: regular   Drug use:  No   Sexual activity: Yes    Birth control/protection: Post-menopausal    Comment: 1st intercourse 22 yo-5 partners  Other Topics Concern   Not on file  Social History Narrative   She works at [employer] and [employer].    Married - 23 years    No kids       She likes to travel, read, cycle, be outside.    Social Determinants of Health   Financial Resource Strain: Not on file  Food Insecurity: Not on  file  Transportation Needs: No Transportation Needs   Lack of Transportation (Medical): No   Lack of Transportation (Non-Medical): No  Physical Activity: Not on file  Stress: Not on file  Social Connections: Not on file  Intimate Partner Violence: Not At Risk   Fear of Current or Ex-Partner: No   Emotionally Abused: No   Physically Abused: No   Sexually Abused: No    FAMILY HISTORY: Family History  Problem Relation Age of Onset   Arthritis Mother    Hypertension Mother    Dementia Mother    Heart disease Father    Liver cancer Father    Asperger's syndrome Brother    Atrial fibrillation Brother     ALLERGIES:  is allergic to ramipril.  MEDICATIONS:  Current Outpatient Medications  Medication Sig Dispense Refill   acyclovir (ZOVIRAX) 400 MG tablet Take 1 tablet (400 mg total) by mouth 2 (two) times daily. 60 tablet 2   amLODipine (NORVASC) 10 MG tablet Take 10 mg by mouth daily.     calcium carbonate (OS-CAL) 600 MG TABS Take 600 mg by mouth daily.     citalopram (CELEXA) 10 MG tablet Take 1 tablet (10 mg total) by mouth daily. 90 tablet 0   Dexamethasone 20 MG TABS Take 40 mg by mouth once a week. Once a week. 60 tablet 3   levothyroxine (SYNTHROID) 75 MCG tablet Take 1 tablet (75 mcg total) by mouth daily before breakfast. 90 tablet 3   ondansetron (ZOFRAN) 8 MG tablet Take 1 tablet (8 mg total) by mouth every 8 (eight) hours as needed for nausea or vomiting. 60 tablet 2   prochlorperazine (COMPAZINE) 10 MG tablet Take 1 tablet (10 mg total) by mouth every 6 (six) hours as needed for nausea or vomiting. 60 tablet 2   traZODone (DESYREL) 50 MG tablet TAKE 0.5-1 TABLETS BY MOUTH AT BEDTIME AS NEEDED FOR SLEEP. 90 tablet 1   VITAMIN D, CHOLECALCIFEROL, PO Take 2,000 Int'l Units by mouth daily.     No current facility-administered medications for this visit.    REVIEW OF SYSTEMS:   Constitutional: ( - ) fevers, ( - )  chills , ( - ) night sweats Eyes: ( - ) blurriness of  vision, ( - ) double vision, ( - ) watery eyes Ears, nose, mouth, throat, and face: ( - ) mucositis, ( - ) sore throat Respiratory: ( - ) cough, ( - ) dyspnea, ( - ) wheezes Cardiovascular: ( - ) palpitation, ( - ) chest discomfort, ( - ) lower extremity swelling Gastrointestinal:  ( - ) nausea, ( - ) heartburn, ( - ) change in bowel habits Skin: ( - ) abnormal skin rashes Lymphatics: ( - ) new lymphadenopathy, ( - ) easy bruising Neurological: ( - ) numbness, ( - ) tingling, ( - ) new weaknesses Behavioral/Psych: ( - ) mood change, ( - ) new changes  All other systems were reviewed with the patient and are  negative.  PHYSICAL EXAMINATION: ECOG PERFORMANCE STATUS: 1 - Symptomatic but completely ambulatory  Vitals:   01/09/21 1116  BP: (!) 162/76  Pulse: 89  Resp: 18  Temp: 97.7 F (36.5 C)  SpO2: 100%   Filed Weights   01/09/21 1116  Weight: 183 lb 1.6 oz (83.1 kg)    GENERAL: Well-appearing middle-age Caucasian female, alert, no distress and comfortable SKIN: skin color, texture, turgor are normal, no rashes or significant lesions EYES: conjunctiva are pink and non-injected, sclera clear CHEST: mass over right clavicle, firm LUNGS: clear to auscultation and percussion with normal breathing effort HEART: regular rate & rhythm and no murmurs and no lower extremity edema Musculoskeletal: no cyanosis of digits and no clubbing  PSYCH: alert & oriented x 3, fluent speech NEURO: no focal motor/sensory deficits  LABORATORY DATA:  I have reviewed the data as listed CBC Latest Ref Rng & Units 01/09/2021 12/19/2020 11/23/2020  WBC 4.0 - 10.5 K/uL 2.6(L) 2.8(L) 2.5(L)  Hemoglobin 12.0 - 15.0 g/dL 10.1(L) 10.8(L) 9.8(L)  Hematocrit 36.0 - 46.0 % 29.3(L) 31.7(L) 29.9(L)  Platelets 150 - 400 K/uL 182 206.0 191    CMP Latest Ref Rng & Units 01/09/2021 12/19/2020 10/23/2020  Glucose 70 - 99 mg/dL 112(H) 101(H) 106(H)  BUN 8 - 23 mg/dL 36(H) 29(H) 44(H)  Creatinine 0.44 - 1.00 mg/dL  2.94(H) 2.74(H) 3.30(H)  Sodium 135 - 145 mmol/L 137 137 138  Potassium 3.5 - 5.1 mmol/L 4.1 4.6 4.8  Chloride 98 - 111 mmol/L 103 101 106  CO2 22 - 32 mmol/L 24 26 22   Calcium 8.9 - 10.3 mg/dL 9.6 9.8 9.7  Total Protein 6.5 - 8.1 g/dL 8.0 8.2 -  Total Bilirubin 0.3 - 1.2 mg/dL 0.5 0.5 -  Alkaline Phos 38 - 126 U/L 56 56 -  AST 15 - 41 U/L 27 17 -  ALT 0 - 44 U/L 24 14 -    Lab Results  Component Value Date   MPROTEIN 0.4 (H) 10/23/2020   Lab Results  Component Value Date   KPAFRELGTCHN 36.6 (H) 10/23/2020   LAMBDASER 4,355.8 (H) 10/23/2020   KAPLAMBRATIO 0.01 (L) 11/01/2020   KAPLAMBRATIO 0.01 (L) 10/23/2020     RADIOGRAPHIC STUDIES: No results found.  [place] 66 y.o. 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  Previously we discussed the diagnosis and treatment options for plasmacytomas without multiple myeloma. This includes radiation therapy verus chemotherapy. Dr. Lorenso Courier discussed case with Dr. Maylene Roes from [facility] and the recommendation was to proceed with chemotherapy due to patients decline in renal function. Patient is scheduled for a consultation with Dr. Maylene Roes on 01/11/2021.   Given the patient's kidney dysfunction at this time the treatment of choice is CyBorD chemotherapy.  We talked about the expected side effects of this regimen and the scheduling.  The patient voiced her understanding of this plan moving forward.   The CyBorD regimen consists of bortezomib 1.5 mg/m2 on days 1, 8, 15, and 22.  Cyclophosphamide 300 mg/m2 IV is administered on days 1, 8, 15, and 22.  Additionally the patient was taking p.o. dexamethasone 40 mg on days 1, 8, 15, and 22.  This is to be continued until the patient reaches a VG PR at which time we could consider transition to bortezomib maintenance.  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   --Serological studies are  confirmatory of a plasma cell neoplasm with markedly high lambda light chains and proteinuria.  --Bone marrow biopsy from 11/07/2020 showed no evidence of multiple myeloma. Right clavicle biopsy from 11/23/2020 confirmed plasma cell neoplasm consistent with plasmacytomas.  --PET scan from 11/27/2020 showed multifocal hypermetabolic soft tissue massing arising from the left and right ribs, left and right chest wall, right clavicle and right inferior pubic ischium. Largest is solid mass in the anterior left chest wall measuring 10.2 x 9.9 cm.  --Recommendation is to initiate systemic chemotherapy due to acute renal dysfunction.  --Treatment of choice is CyBorD which will start today, 01/10/2020.  --Weekly labs to check CBC, CMP and LDH. Check myeloma labs and UPEP monthly.  --Patient will have weekly treatments and follow up visits every 2 weeks.  #Neutropenia/Anemia: --Secondary to underlying malignancy. --Hgb 10.1 and ANC 1.2 today, stable over the last two months.  --Continue to monitor.   #Renal dysfunction: --Secondary to underlying malignancy.  --Creatinine is 2.94, stable over the last two months.  --Continue to monitor.   #Supportive Care -- port placement not required -- zofran 78m q8H PRN and compazine 179mPO q6H for nausea -- acyclovir 40050mO BID for VCZ prophylaxis -- no pain medication required at this time.   Follow up: --Labs today were reviewed and adequate for treatment today. Patient will proceed with Cycle 1, Day 1 of CyBorD.  --SPEP and sFLC levels pending.  --RTC to see Dr. DorLorenso Courier 01/23/21 prior to Cycle 1, Day 15.   No orders of the defined types were placed in this encounter.   All questions were answered. The patient knows to call the clinic with any problems, questions or concerns.  I have spent a total of 35 minutes minutes of face-to-face and non-face-to-face time, preparing to see the  patient, performing a medically appropriate examination, counseling and educating the patient, ordering medications, documenting clinical information in the electronic health record,  and care coordination.    IreDede Query-C Dept of Hematology and OncMila Doce WesVidant [facility] Hospitalone: [phone]

## 2021-01-09 NOTE — Telephone Encounter (Signed)
can you call patient and let her know that along with the other supportive meds. I've sent oral dexamethasone. She needs to take 40 mg once a week. Recommend to take morning before her treatment to minimize insomnia  Pt advised with understanding

## 2021-01-09 NOTE — Patient Instructions (Signed)
[facility]  Discharge Instructions: Thank you for choosing [facility] to provide your oncology and hematology care.   If you have a lab appointment with the [facility], please go directly to the [facility] and check in at the registration area.   Wear comfortable clothing and clothing appropriate for easy access to any Portacath or PICC line.   We strive to give you quality time with your provider. You may need to reschedule your appointment if you arrive late (15 or more minutes).  Arriving late affects you and other patients whose appointments are after yours.  Also, if you miss three or more appointments without notifying the office, you may be dismissed from the clinic at the providers discretion.      For prescription refill requests, have your pharmacy contact our office and allow 72 hours for refills to be completed.    Today you received the following chemotherapy and/or immunotherapy agents velcade, cytoxan      To help prevent nausea and vomiting after your treatment, we encourage you to take your nausea medication as directed.  BELOW ARE SYMPTOMS THAT SHOULD BE REPORTED IMMEDIATELY: *FEVER GREATER THAN 100.4 F (38 C) OR HIGHER *CHILLS OR SWEATING *NAUSEA AND VOMITING THAT IS NOT CONTROLLED WITH YOUR NAUSEA MEDICATION *UNUSUAL SHORTNESS OF BREATH *UNUSUAL BRUISING OR BLEEDING *URINARY PROBLEMS (pain or burning when urinating, or frequent urination) *BOWEL PROBLEMS (unusual diarrhea, constipation, pain near the anus) TENDERNESS IN MOUTH AND THROAT WITH OR WITHOUT PRESENCE OF ULCERS (sore throat, sores in mouth, or a toothache) UNUSUAL RASH, SWELLING OR PAIN  UNUSUAL VAGINAL DISCHARGE OR ITCHING   Items with * indicate a potential emergency and should be followed up as soon as possible or go to the Emergency Department if any problems should occur.  Please show the CHEMOTHERAPY ALERT CARD or IMMUNOTHERAPY ALERT CARD at  check-in to the Emergency Department and triage nurse.  Should you have questions after your visit or need to cancel or reschedule your appointment, please contact [facility]  Dept: [phone]  and follow the prompts.  Office hours are 8:00 a.m. to 4:30 p.m. Monday - Friday. Please note that voicemails left after 4:00 p.m. may not be returned until the following business day.  We are closed weekends and major holidays. You have access to a nurse at all times for urgent questions. Please call the main number to the clinic Dept: [phone] and follow the prompts.   For any non-urgent questions, you may also contact your provider using MyChart. We now offer e-Visits for anyone 71 and older to request care online for non-urgent symptoms. For details visit [email or web address].   Also download the MyChart app! Go to the app store, search "MyChart", open the app, select [facility], and log in with your MyChart username and password.  Due to Covid, a mask is required upon entering the hospital/clinic. If you do not have a mask, one will be given to you upon arrival. For doctor visits, patients may have 1 support person aged 36 or older with them. For treatment visits, patients cannot have anyone with them due to current Covid guidelines and our immunocompromised population.   Bortezomib injection What is this medication? BORTEZOMIB (bor TEZ oh mib) targets proteins in cancer cells and stops the cancer cells from growing. It treats multiple myeloma and mantle cell lymphoma. This medicine may be used for other purposes; ask your health care provider or pharmacist if you  have questions. COMMON BRAND NAME(S): Velcade What should I tell my care team before I take this medication? They need to know if you have any of these conditions: dehydration diabetes (high blood sugar) heart disease liver disease tingling of the fingers or toes or other nerve disorder an unusual  or allergic reaction to bortezomib, mannitol, boron, other medicines, foods, dyes, or preservatives pregnant or trying to get pregnant breast-feeding How should I use this medication? This medicine is injected into a vein or under the skin. It is given by a health care provider in a hospital or clinic setting. Talk to your health care provider about the use of this medicine in children. Special care may be needed. Overdosage: If you think you have taken too much of this medicine contact a poison control center or emergency room at once. NOTE: This medicine is only for you. Do not share this medicine with others. What if I miss a dose? Keep appointments for follow-up doses. It is important not to miss your dose. Call your health care provider if you are unable to keep an appointment. What may interact with this medication? This medicine may interact with the following medications: ketoconazole rifampin This list may not describe all possible interactions. Give your health care provider a list of all the medicines, herbs, non-prescription drugs, or dietary supplements you use. Also tell them if you smoke, drink alcohol, or use illegal drugs. Some items may interact with your medicine. What should I watch for while using this medication? Your condition will be monitored carefully while you are receiving this medicine. You may need blood work done while you are taking this medicine. You may get drowsy or dizzy. Do not drive, use machinery, or do anything that needs mental alertness until you know how this medicine affects you. Do not stand up or sit up quickly, especially if you are an older patient. This reduces the risk of dizzy or fainting spells This medicine may increase your risk of getting an infection. Call your health care provider for advice if you get a fever, chills, sore throat, or other symptoms of a cold or flu. Do not treat yourself. Try to avoid being around people who are  sick. Check with your health care provider if you have severe diarrhea, nausea, and vomiting, or if you sweat a lot. The loss of too much body fluid may make it dangerous for you to take this medicine. Do not become pregnant while taking this medicine or for 7 months after stopping it. Women should inform their health care provider if they wish to become pregnant or think they might be pregnant. Men should not father a child while taking this medicine and for 4 months after stopping it. There is a potential for serious harm to an unborn child. Talk to your health care provider for more information. Do not breast-feed an infant while taking this medicine or for 2 months after stopping it. This medicine may make it more difficult to get pregnant or father a child. Talk to your health care provider if you are concerned about your fertility. What side effects may I notice from receiving this medication? Side effects that you should report to your doctor or health care professional as soon as possible: allergic reactions (skin rash; itching or hives; swelling of the face, lips, or tongue) bleeding (bloody or black, tarry stools; red or dark brown urine; spitting up blood or brown material that looks like coffee grounds; red spots on the skin;  unusual bruising or bleeding from the eye, gums, or nose) blurred vision or changes in vision confusion constipation headache heart failure (trouble breathing; fast, irregular heartbeat; sudden weight gain; swelling of the ankles, feet, hands) infection (fever, chills, cough, sore throat, pain or trouble passing urine) lack or loss of appetite liver injury (dark yellow or brown urine; general ill feeling or flu-like symptoms; loss of appetite, right upper belly pain; yellowing of the eyes or skin) low blood pressure (dizziness; feeling faint or lightheaded, falls; unusually weak or tired) muscle cramps pain, redness, or irritation at site where injected pain,  tingling, numbness in the hands or feet seizures trouble breathing unusual bruising or bleeding Side effects that usually do not require medical attention (report to your doctor or health care professional if they continue or are bothersome): diarrhea nausea stomach pain trouble sleeping vomiting This list may not describe all possible side effects. Call your doctor for medical advice about side effects. You may report side effects to FDA at 1-800-FDA-1088. Where should I keep my medication? This medicine is given in a hospital or clinic. It will not be stored at home. NOTE: This sheet is a summary. It may not cover all possible information. If you have questions about this medicine, talk to your doctor, pharmacist, or health care provider.  2022 Elsevier/Gold Standard (2019-12-15 00:00:00)  Cyclophosphamide Injection What is this medication? CYCLOPHOSPHAMIDE (sye kloe FOSS fa mide) is a chemotherapy drug. It slows the growth of cancer cells. This medicine is used to treat many types of cancer like lymphoma, myeloma, leukemia, breast cancer, and ovarian cancer, to name a few. This medicine may be used for other purposes; ask your health care provider or pharmacist if you have questions. COMMON BRAND NAME(S): Cytoxan, Neosar What should I tell my care team before I take this medication? They need to know if you have any of these conditions: heart disease history of irregular heartbeat infection kidney disease liver disease low blood counts, like white cells, platelets, or red blood cells on hemodialysis recent or ongoing radiation therapy scarring or thickening of the lungs trouble passing urine an unusual or allergic reaction to cyclophosphamide, other medicines, foods, dyes, or preservatives pregnant or trying to get pregnant breast-feeding How should I use this medication? This drug is usually given as an injection into a vein or muscle or by infusion into a vein. It is  administered in a hospital or clinic by a specially trained health care professional. Talk to your pediatrician regarding the use of this medicine in children. Special care may be needed. Overdosage: If you think you have taken too much of this medicine contact a poison control center or emergency room at once. NOTE: This medicine is only for you. Do not share this medicine with others. What if I miss a dose? It is important not to miss your dose. Call your doctor or health care professional if you are unable to keep an appointment. What may interact with this medication? amphotericin B azathioprine certain antivirals for HIV or hepatitis certain medicines for blood pressure, heart disease, irregular heart beat certain medicines that treat or prevent blood clots like warfarin certain other medicines for cancer cyclosporine etanercept indomethacin medicines that relax muscles for surgery medicines to increase blood counts metronidazole This list may not describe all possible interactions. Give your health care provider a list of all the medicines, herbs, non-prescription drugs, or dietary supplements you use. Also tell them if you smoke, drink alcohol, or use illegal drugs. Some  items may interact with your medicine. What should I watch for while using this medication? Your condition will be monitored carefully while you are receiving this medicine. You may need blood work done while you are taking this medicine. Drink water or other fluids as directed. Urinate often, even at night. Some products may contain alcohol. Ask your health care professional if this medicine contains alcohol. Be sure to tell all health care professionals you are taking this medicine. Certain medicines, like metronidazole and disulfiram, can cause an unpleasant reaction when taken with alcohol. The reaction includes flushing, headache, nausea, vomiting, sweating, and increased thirst. The reaction can last from 30  minutes to several hours. Do not become pregnant while taking this medicine or for 1 year after stopping it. Women should inform their health care professional if they wish to become pregnant or think they might be pregnant. Men should not father a child while taking this medicine and for 4 months after stopping it. There is potential for serious side effects to an unborn child. Talk to your health care professional for more information. Do not breast-feed an infant while taking this medicine or for 1 week after stopping it. This medicine has caused ovarian failure in some women. This medicine may make it more difficult to get pregnant. Talk to your health care professional if you are concerned about your fertility. This medicine has caused decreased sperm counts in some men. This may make it more difficult to father a child. Talk to your health care professional if you are concerned about your fertility. Call your health care professional for advice if you get a fever, chills, or sore throat, or other symptoms of a cold or flu. Do not treat yourself. This medicine decreases your body's ability to fight infections. Try to avoid being around people who are sick. Avoid taking medicines that contain aspirin, acetaminophen, ibuprofen, naproxen, or ketoprofen unless instructed by your health care professional. These medicines may hide a fever. Talk to your health care professional about your risk of cancer. You may be more at risk for certain types of cancer if you take this medicine. If you are going to need surgery or other procedure, tell your health care professional that you are using this medicine. Be careful brushing or flossing your teeth or using a toothpick because you may get an infection or bleed more easily. If you have any dental work done, tell your dentist you are receiving this medicine. What side effects may I notice from receiving this medication? Side effects that you should report to your  doctor or health care professional as soon as possible: allergic reactions like skin rash, itching or hives, swelling of the face, lips, or tongue breathing problems nausea, vomiting signs and symptoms of bleeding such as bloody or black, tarry stools; red or dark brown urine; spitting up blood or brown material that looks like coffee grounds; red spots on the skin; unusual bruising or bleeding from the eyes, gums, or nose signs and symptoms of heart failure like fast, irregular heartbeat, sudden weight gain; swelling of the ankles, feet, hands signs and symptoms of infection like fever; chills; cough; sore throat; pain or trouble passing urine signs and symptoms of kidney injury like trouble passing urine or change in the amount of urine signs and symptoms of liver injury like dark yellow or brown urine; general ill feeling or flu-like symptoms; light-colored stools; loss of appetite; nausea; right upper belly pain; unusually weak or tired; yellowing of the eyes  or skin Side effects that usually do not require medical attention (report to your doctor or health care professional if they continue or are bothersome): confusion decreased hearing diarrhea facial flushing hair loss headache loss of appetite missed menstrual periods signs and symptoms of low red blood cells or anemia such as unusually weak or tired; feeling faint or lightheaded; falls skin discoloration This list may not describe all possible side effects. Call your doctor for medical advice about side effects. You may report side effects to FDA at 1-800-FDA-1088. Where should I keep my medication? This drug is given in a hospital or clinic and will not be stored at home. NOTE: This sheet is a summary. It may not cover all possible information. If you have questions about this medicine, talk to your doctor, pharmacist, or health care provider.  2022 Elsevier/Gold Standard (2020-09-11 00:00:00)

## 2021-01-10 ENCOUNTER — Ambulatory Visit: Payer: 59

## 2021-01-10 ENCOUNTER — Telehealth: Payer: Self-pay

## 2021-01-10 ENCOUNTER — Encounter: Payer: Self-pay | Admitting: Hematology and Oncology

## 2021-01-10 LAB — KAPPA/LAMBDA LIGHT CHAINS
Kappa free light chain: 36.3 mg/L — ABNORMAL HIGH (ref 3.3–19.4)
Kappa, lambda light chain ratio: 0.01 — ABNORMAL LOW (ref 0.26–1.65)
Lambda free light chains: 5514.1 mg/L — ABNORMAL HIGH (ref 5.7–26.3)

## 2021-01-10 NOTE — Telephone Encounter (Signed)
Jordan Gregory states that she is doing well.  She is eating, drinking, and urinating well. No N/V. Velcade injection site slightly pink. Site not sore or itchy. Jordan Gregory has the office number of [phone] if she has any questions or concerns.

## 2021-01-11 ENCOUNTER — Ambulatory Visit: Payer: 59

## 2021-01-14 ENCOUNTER — Ambulatory Visit: Payer: 59

## 2021-01-14 ENCOUNTER — Other Ambulatory Visit: Payer: Self-pay | Admitting: Physician Assistant

## 2021-01-14 LAB — MULTIPLE MYELOMA PANEL, SERUM
Albumin SerPl Elph-Mcnc: 4.2 g/dL (ref 2.9–4.4)
Albumin/Glob SerPl: 1.4 (ref 0.7–1.7)
Alpha 1: 0.3 g/dL (ref 0.0–0.4)
Alpha2 Glob SerPl Elph-Mcnc: 0.7 g/dL (ref 0.4–1.0)
B-Globulin SerPl Elph-Mcnc: 0.9 g/dL (ref 0.7–1.3)
Gamma Glob SerPl Elph-Mcnc: 1.4 g/dL (ref 0.4–1.8)
Globulin, Total: 3.2 g/dL (ref 2.2–3.9)
IgA: 39 mg/dL — ABNORMAL LOW (ref 87–352)
IgG (Immunoglobin G), Serum: 1326 mg/dL (ref 586–1602)
IgM (Immunoglobulin M), Srm: 31 mg/dL (ref 26–217)
M Protein SerPl Elph-Mcnc: 0.3 g/dL — ABNORMAL HIGH
Total Protein ELP: 7.4 g/dL (ref 6.0–8.5)

## 2021-01-15 ENCOUNTER — Ambulatory Visit: Payer: 59

## 2021-01-16 ENCOUNTER — Inpatient Hospital Stay: Payer: 59

## 2021-01-16 ENCOUNTER — Other Ambulatory Visit: Payer: Self-pay | Admitting: Hematology and Oncology

## 2021-01-16 ENCOUNTER — Other Ambulatory Visit: Payer: Self-pay

## 2021-01-16 ENCOUNTER — Ambulatory Visit: Payer: 59

## 2021-01-16 VITALS — BP 140/74 | HR 80 | Temp 98.0°F | Resp 18 | Wt 181.5 lb

## 2021-01-16 DIAGNOSIS — C9000 Multiple myeloma not having achieved remission: Secondary | ICD-10-CM

## 2021-01-16 DIAGNOSIS — C9030 Solitary plasmacytoma not having achieved remission: Secondary | ICD-10-CM

## 2021-01-16 DIAGNOSIS — Z5111 Encounter for antineoplastic chemotherapy: Secondary | ICD-10-CM | POA: Diagnosis not present

## 2021-01-16 LAB — CBC WITH DIFFERENTIAL (CANCER CENTER ONLY)
Abs Immature Granulocytes: 0.01 10*3/uL (ref 0.00–0.07)
Basophils Absolute: 0.1 10*3/uL (ref 0.0–0.1)
Basophils Relative: 2 %
Eosinophils Absolute: 0.2 10*3/uL (ref 0.0–0.5)
Eosinophils Relative: 7 %
HCT: 28.5 % — ABNORMAL LOW (ref 36.0–46.0)
Hemoglobin: 9.6 g/dL — ABNORMAL LOW (ref 12.0–15.0)
Immature Granulocytes: 0 %
Lymphocytes Relative: 25 %
Lymphs Abs: 0.7 10*3/uL (ref 0.7–4.0)
MCH: 28.7 pg (ref 26.0–34.0)
MCHC: 33.7 g/dL (ref 30.0–36.0)
MCV: 85.1 fL (ref 80.0–100.0)
Monocytes Absolute: 0.2 10*3/uL (ref 0.1–1.0)
Monocytes Relative: 8 %
Neutro Abs: 1.5 10*3/uL — ABNORMAL LOW (ref 1.7–7.7)
Neutrophils Relative %: 58 %
Platelet Count: 186 10*3/uL (ref 150–400)
RBC: 3.35 MIL/uL — ABNORMAL LOW (ref 3.87–5.11)
RDW: 12.8 % (ref 11.5–15.5)
WBC Count: 2.6 10*3/uL — ABNORMAL LOW (ref 4.0–10.5)
nRBC: 0 % (ref 0.0–0.2)

## 2021-01-16 LAB — CMP (CANCER CENTER ONLY)
ALT: 17 U/L (ref 0–44)
AST: 16 U/L (ref 15–41)
Albumin: 4.4 g/dL (ref 3.5–5.0)
Alkaline Phosphatase: 46 U/L (ref 38–126)
Anion gap: 9 (ref 5–15)
BUN: 32 mg/dL — ABNORMAL HIGH (ref 8–23)
CO2: 23 mmol/L (ref 22–32)
Calcium: 9.4 mg/dL (ref 8.9–10.3)
Chloride: 105 mmol/L (ref 98–111)
Creatinine: 2.7 mg/dL — ABNORMAL HIGH (ref 0.44–1.00)
GFR, Estimated: 19 mL/min — ABNORMAL LOW (ref >60)
Glucose, Bld: 119 mg/dL — ABNORMAL HIGH (ref 70–99)
Potassium: 4.2 mmol/L (ref 3.5–5.1)
Sodium: 137 mmol/L (ref 135–145)
Total Bilirubin: 0.5 mg/dL (ref 0.3–1.2)
Total Protein: 7.8 g/dL (ref 6.5–8.1)

## 2021-01-16 LAB — LACTATE DEHYDROGENASE: LDH: 180 U/L (ref 98–192)

## 2021-01-16 LAB — URIC ACID: Uric Acid, Serum: 4.8 mg/dL (ref 2.5–7.1)

## 2021-01-16 LAB — PHOSPHORUS: Phosphorus: 4.3 mg/dL (ref 2.5–4.6)

## 2021-01-16 MED ORDER — BORTEZOMIB CHEMO SQ INJECTION 3.5 MG (2.5MG/ML)
1.5000 mg/m2 | Freq: Once | INTRAMUSCULAR | Status: AC
  Administered 2021-01-16: 3 mg via SUBCUTANEOUS
  Filled 2021-01-16: qty 1.2

## 2021-01-16 MED ORDER — SODIUM CHLORIDE 0.9 % IV SOLN: Freq: Once | INTRAVENOUS | Status: AC

## 2021-01-16 MED ORDER — SODIUM CHLORIDE 0.9 % IV SOLN
300.0000 mg/m2 | Freq: Once | INTRAVENOUS | Status: AC
  Administered 2021-01-16: 600 mg via INTRAVENOUS
  Filled 2021-01-16: qty 30

## 2021-01-16 MED ORDER — PALONOSETRON HCL INJECTION 0.25 MG/5ML
0.2500 mg | Freq: Once | INTRAVENOUS | Status: AC
  Administered 2021-01-16: 0.25 mg via INTRAVENOUS

## 2021-01-16 NOTE — Progress Notes (Signed)
Ok to treat with elevated Scr per Dr Lorenso Courier.

## 2021-01-16 NOTE — Patient Instructions (Signed)
[facility]  Discharge Instructions: Thank you for choosing [facility] to provide your oncology and hematology care.   If you have a lab appointment with the [facility], please go directly to the [facility] and check in at the registration area.   Wear comfortable clothing and clothing appropriate for easy access to any Portacath or PICC line.   We strive to give you quality time with your provider. You may need to reschedule your appointment if you arrive late (15 or more minutes).  Arriving late affects you and other patients whose appointments are after yours.  Also, if you miss three or more appointments without notifying the office, you may be dismissed from the clinic at the providers discretion.      For prescription refill requests, have your pharmacy contact our office and allow 72 hours for refills to be completed.    Today you received the following chemotherapy and/or immunotherapy agents velcade, cytoxan      To help prevent nausea and vomiting after your treatment, we encourage you to take your nausea medication as directed.  BELOW ARE SYMPTOMS THAT SHOULD BE REPORTED IMMEDIATELY: *FEVER GREATER THAN 100.4 F (38 C) OR HIGHER *CHILLS OR SWEATING *NAUSEA AND VOMITING THAT IS NOT CONTROLLED WITH YOUR NAUSEA MEDICATION *UNUSUAL SHORTNESS OF BREATH *UNUSUAL BRUISING OR BLEEDING *URINARY PROBLEMS (pain or burning when urinating, or frequent urination) *BOWEL PROBLEMS (unusual diarrhea, constipation, pain near the anus) TENDERNESS IN MOUTH AND THROAT WITH OR WITHOUT PRESENCE OF ULCERS (sore throat, sores in mouth, or a toothache) UNUSUAL RASH, SWELLING OR PAIN  UNUSUAL VAGINAL DISCHARGE OR ITCHING   Items with * indicate a potential emergency and should be followed up as soon as possible or go to the Emergency Department if any problems should occur.  Please show the CHEMOTHERAPY ALERT CARD or IMMUNOTHERAPY ALERT CARD at  check-in to the Emergency Department and triage nurse.  Should you have questions after your visit or need to cancel or reschedule your appointment, please contact [facility]  Dept: [phone]  and follow the prompts.  Office hours are 8:00 a.m. to 4:30 p.m. Monday - Friday. Please note that voicemails left after 4:00 p.m. may not be returned until the following business day.  We are closed weekends and major holidays. You have access to a nurse at all times for urgent questions. Please call the main number to the clinic Dept: [phone] and follow the prompts.   For any non-urgent questions, you may also contact your provider using MyChart. We now offer e-Visits for anyone 71 and older to request care online for non-urgent symptoms. For details visit [email or web address].   Also download the MyChart app! Go to the app store, search "MyChart", open the app, select [facility], and log in with your MyChart username and password.  Due to Covid, a mask is required upon entering the hospital/clinic. If you do not have a mask, one will be given to you upon arrival. For doctor visits, patients may have 1 support person aged 36 or older with them. For treatment visits, patients cannot have anyone with them due to current Covid guidelines and our immunocompromised population.   Bortezomib injection What is this medication? BORTEZOMIB (bor TEZ oh mib) targets proteins in cancer cells and stops the cancer cells from growing. It treats multiple myeloma and mantle cell lymphoma. This medicine may be used for other purposes; ask your health care provider or pharmacist if you  have questions. COMMON BRAND NAME(S): Velcade What should I tell my care team before I take this medication? They need to know if you have any of these conditions: dehydration diabetes (high blood sugar) heart disease liver disease tingling of the fingers or toes or other nerve disorder an unusual  or allergic reaction to bortezomib, mannitol, boron, other medicines, foods, dyes, or preservatives pregnant or trying to get pregnant breast-feeding How should I use this medication? This medicine is injected into a vein or under the skin. It is given by a health care provider in a hospital or clinic setting. Talk to your health care provider about the use of this medicine in children. Special care may be needed. Overdosage: If you think you have taken too much of this medicine contact a poison control center or emergency room at once. NOTE: This medicine is only for you. Do not share this medicine with others. What if I miss a dose? Keep appointments for follow-up doses. It is important not to miss your dose. Call your health care provider if you are unable to keep an appointment. What may interact with this medication? This medicine may interact with the following medications: ketoconazole rifampin This list may not describe all possible interactions. Give your health care provider a list of all the medicines, herbs, non-prescription drugs, or dietary supplements you use. Also tell them if you smoke, drink alcohol, or use illegal drugs. Some items may interact with your medicine. What should I watch for while using this medication? Your condition will be monitored carefully while you are receiving this medicine. You may need blood work done while you are taking this medicine. You may get drowsy or dizzy. Do not drive, use machinery, or do anything that needs mental alertness until you know how this medicine affects you. Do not stand up or sit up quickly, especially if you are an older patient. This reduces the risk of dizzy or fainting spells This medicine may increase your risk of getting an infection. Call your health care provider for advice if you get a fever, chills, sore throat, or other symptoms of a cold or flu. Do not treat yourself. Try to avoid being around people who are  sick. Check with your health care provider if you have severe diarrhea, nausea, and vomiting, or if you sweat a lot. The loss of too much body fluid may make it dangerous for you to take this medicine. Do not become pregnant while taking this medicine or for 7 months after stopping it. Women should inform their health care provider if they wish to become pregnant or think they might be pregnant. Men should not father a child while taking this medicine and for 4 months after stopping it. There is a potential for serious harm to an unborn child. Talk to your health care provider for more information. Do not breast-feed an infant while taking this medicine or for 2 months after stopping it. This medicine may make it more difficult to get pregnant or father a child. Talk to your health care provider if you are concerned about your fertility. What side effects may I notice from receiving this medication? Side effects that you should report to your doctor or health care professional as soon as possible: allergic reactions (skin rash; itching or hives; swelling of the face, lips, or tongue) bleeding (bloody or black, tarry stools; red or dark brown urine; spitting up blood or brown material that looks like coffee grounds; red spots on the skin;  unusual bruising or bleeding from the eye, gums, or nose) blurred vision or changes in vision confusion constipation headache heart failure (trouble breathing; fast, irregular heartbeat; sudden weight gain; swelling of the ankles, feet, hands) infection (fever, chills, cough, sore throat, pain or trouble passing urine) lack or loss of appetite liver injury (dark yellow or brown urine; general ill feeling or flu-like symptoms; loss of appetite, right upper belly pain; yellowing of the eyes or skin) low blood pressure (dizziness; feeling faint or lightheaded, falls; unusually weak or tired) muscle cramps pain, redness, or irritation at site where injected pain,  tingling, numbness in the hands or feet seizures trouble breathing unusual bruising or bleeding Side effects that usually do not require medical attention (report to your doctor or health care professional if they continue or are bothersome): diarrhea nausea stomach pain trouble sleeping vomiting This list may not describe all possible side effects. Call your doctor for medical advice about side effects. You may report side effects to FDA at 1-800-FDA-1088. Where should I keep my medication? This medicine is given in a hospital or clinic. It will not be stored at home. NOTE: This sheet is a summary. It may not cover all possible information. If you have questions about this medicine, talk to your doctor, pharmacist, or health care provider.  2022 Elsevier/Gold Standard (2019-12-15 00:00:00)  Cyclophosphamide Injection What is this medication? CYCLOPHOSPHAMIDE (sye kloe FOSS fa mide) is a chemotherapy drug. It slows the growth of cancer cells. This medicine is used to treat many types of cancer like lymphoma, myeloma, leukemia, breast cancer, and ovarian cancer, to name a few. This medicine may be used for other purposes; ask your health care provider or pharmacist if you have questions. COMMON BRAND NAME(S): Cytoxan, Neosar What should I tell my care team before I take this medication? They need to know if you have any of these conditions: heart disease history of irregular heartbeat infection kidney disease liver disease low blood counts, like white cells, platelets, or red blood cells on hemodialysis recent or ongoing radiation therapy scarring or thickening of the lungs trouble passing urine an unusual or allergic reaction to cyclophosphamide, other medicines, foods, dyes, or preservatives pregnant or trying to get pregnant breast-feeding How should I use this medication? This drug is usually given as an injection into a vein or muscle or by infusion into a vein. It is  administered in a hospital or clinic by a specially trained health care professional. Talk to your pediatrician regarding the use of this medicine in children. Special care may be needed. Overdosage: If you think you have taken too much of this medicine contact a poison control center or emergency room at once. NOTE: This medicine is only for you. Do not share this medicine with others. What if I miss a dose? It is important not to miss your dose. Call your doctor or health care professional if you are unable to keep an appointment. What may interact with this medication? amphotericin B azathioprine certain antivirals for HIV or hepatitis certain medicines for blood pressure, heart disease, irregular heart beat certain medicines that treat or prevent blood clots like warfarin certain other medicines for cancer cyclosporine etanercept indomethacin medicines that relax muscles for surgery medicines to increase blood counts metronidazole This list may not describe all possible interactions. Give your health care provider a list of all the medicines, herbs, non-prescription drugs, or dietary supplements you use. Also tell them if you smoke, drink alcohol, or use illegal drugs. Some  items may interact with your medicine. What should I watch for while using this medication? Your condition will be monitored carefully while you are receiving this medicine. You may need blood work done while you are taking this medicine. Drink water or other fluids as directed. Urinate often, even at night. Some products may contain alcohol. Ask your health care professional if this medicine contains alcohol. Be sure to tell all health care professionals you are taking this medicine. Certain medicines, like metronidazole and disulfiram, can cause an unpleasant reaction when taken with alcohol. The reaction includes flushing, headache, nausea, vomiting, sweating, and increased thirst. The reaction can last from 30  minutes to several hours. Do not become pregnant while taking this medicine or for 1 year after stopping it. Women should inform their health care professional if they wish to become pregnant or think they might be pregnant. Men should not father a child while taking this medicine and for 4 months after stopping it. There is potential for serious side effects to an unborn child. Talk to your health care professional for more information. Do not breast-feed an infant while taking this medicine or for 1 week after stopping it. This medicine has caused ovarian failure in some women. This medicine may make it more difficult to get pregnant. Talk to your health care professional if you are concerned about your fertility. This medicine has caused decreased sperm counts in some men. This may make it more difficult to father a child. Talk to your health care professional if you are concerned about your fertility. Call your health care professional for advice if you get a fever, chills, or sore throat, or other symptoms of a cold or flu. Do not treat yourself. This medicine decreases your body's ability to fight infections. Try to avoid being around people who are sick. Avoid taking medicines that contain aspirin, acetaminophen, ibuprofen, naproxen, or ketoprofen unless instructed by your health care professional. These medicines may hide a fever. Talk to your health care professional about your risk of cancer. You may be more at risk for certain types of cancer if you take this medicine. If you are going to need surgery or other procedure, tell your health care professional that you are using this medicine. Be careful brushing or flossing your teeth or using a toothpick because you may get an infection or bleed more easily. If you have any dental work done, tell your dentist you are receiving this medicine. What side effects may I notice from receiving this medication? Side effects that you should report to your  doctor or health care professional as soon as possible: allergic reactions like skin rash, itching or hives, swelling of the face, lips, or tongue breathing problems nausea, vomiting signs and symptoms of bleeding such as bloody or black, tarry stools; red or dark brown urine; spitting up blood or brown material that looks like coffee grounds; red spots on the skin; unusual bruising or bleeding from the eyes, gums, or nose signs and symptoms of heart failure like fast, irregular heartbeat, sudden weight gain; swelling of the ankles, feet, hands signs and symptoms of infection like fever; chills; cough; sore throat; pain or trouble passing urine signs and symptoms of kidney injury like trouble passing urine or change in the amount of urine signs and symptoms of liver injury like dark yellow or brown urine; general ill feeling or flu-like symptoms; light-colored stools; loss of appetite; nausea; right upper belly pain; unusually weak or tired; yellowing of the eyes  or skin Side effects that usually do not require medical attention (report to your doctor or health care professional if they continue or are bothersome): confusion decreased hearing diarrhea facial flushing hair loss headache loss of appetite missed menstrual periods signs and symptoms of low red blood cells or anemia such as unusually weak or tired; feeling faint or lightheaded; falls skin discoloration This list may not describe all possible side effects. Call your doctor for medical advice about side effects. You may report side effects to FDA at 1-800-FDA-1088. Where should I keep my medication? This drug is given in a hospital or clinic and will not be stored at home. NOTE: This sheet is a summary. It may not cover all possible information. If you have questions about this medicine, talk to your doctor, pharmacist, or health care provider.  2022 Elsevier/Gold Standard (2020-09-11 00:00:00)

## 2021-01-17 ENCOUNTER — Ambulatory Visit: Payer: 59

## 2021-01-17 ENCOUNTER — Encounter: Payer: Self-pay | Admitting: Gastroenterology

## 2021-01-18 ENCOUNTER — Ambulatory Visit: Payer: 59

## 2021-01-21 ENCOUNTER — Ambulatory Visit: Payer: 59

## 2021-01-22 ENCOUNTER — Other Ambulatory Visit: Payer: Self-pay | Admitting: *Deleted

## 2021-01-22 ENCOUNTER — Ambulatory Visit: Payer: 59

## 2021-01-22 DIAGNOSIS — C9000 Multiple myeloma not having achieved remission: Secondary | ICD-10-CM

## 2021-01-23 ENCOUNTER — Ambulatory Visit: Payer: 59

## 2021-01-23 ENCOUNTER — Inpatient Hospital Stay (HOSPITAL_BASED_OUTPATIENT_CLINIC_OR_DEPARTMENT_OTHER): Payer: 59

## 2021-01-23 ENCOUNTER — Inpatient Hospital Stay (HOSPITAL_BASED_OUTPATIENT_CLINIC_OR_DEPARTMENT_OTHER): Payer: 59 | Admitting: Hematology and Oncology

## 2021-01-23 ENCOUNTER — Other Ambulatory Visit: Payer: Self-pay

## 2021-01-23 ENCOUNTER — Inpatient Hospital Stay: Payer: 59

## 2021-01-23 VITALS — BP 150/74 | HR 103 | Temp 96.9°F | Resp 16 | Wt 184.2 lb

## 2021-01-23 VITALS — HR 99 | Temp 98.2°F

## 2021-01-23 DIAGNOSIS — C9030 Solitary plasmacytoma not having achieved remission: Secondary | ICD-10-CM | POA: Diagnosis not present

## 2021-01-23 DIAGNOSIS — C9000 Multiple myeloma not having achieved remission: Secondary | ICD-10-CM

## 2021-01-23 DIAGNOSIS — Z5111 Encounter for antineoplastic chemotherapy: Secondary | ICD-10-CM | POA: Diagnosis not present

## 2021-01-23 LAB — CBC WITH DIFFERENTIAL (CANCER CENTER ONLY)
Abs Immature Granulocytes: 0.02 10*3/uL (ref 0.00–0.07)
Basophils Absolute: 0 10*3/uL (ref 0.0–0.1)
Basophils Relative: 1 %
Eosinophils Absolute: 0 10*3/uL (ref 0.0–0.5)
Eosinophils Relative: 1 %
HCT: 28.3 % — ABNORMAL LOW (ref 36.0–46.0)
Hemoglobin: 9.6 g/dL — ABNORMAL LOW (ref 12.0–15.0)
Immature Granulocytes: 1 %
Lymphocytes Relative: 9 %
Lymphs Abs: 0.3 10*3/uL — ABNORMAL LOW (ref 0.7–4.0)
MCH: 28.8 pg (ref 26.0–34.0)
MCHC: 33.9 g/dL (ref 30.0–36.0)
MCV: 85 fL (ref 80.0–100.0)
Monocytes Absolute: 0.1 10*3/uL (ref 0.1–1.0)
Monocytes Relative: 2 %
Neutro Abs: 2.9 10*3/uL (ref 1.7–7.7)
Neutrophils Relative %: 86 %
Platelet Count: 212 10*3/uL (ref 150–400)
RBC: 3.33 MIL/uL — ABNORMAL LOW (ref 3.87–5.11)
RDW: 12.6 % (ref 11.5–15.5)
WBC Count: 3.3 10*3/uL — ABNORMAL LOW (ref 4.0–10.5)
nRBC: 0 % (ref 0.0–0.2)

## 2021-01-23 LAB — CMP (CANCER CENTER ONLY)
ALT: 17 U/L (ref 0–44)
AST: 22 U/L (ref 15–41)
Albumin: 4.5 g/dL (ref 3.5–5.0)
Alkaline Phosphatase: 53 U/L (ref 38–126)
Anion gap: 11 (ref 5–15)
BUN: 38 mg/dL — ABNORMAL HIGH (ref 8–23)
CO2: 20 mmol/L — ABNORMAL LOW (ref 22–32)
Calcium: 9 mg/dL (ref 8.9–10.3)
Chloride: 104 mmol/L (ref 98–111)
Creatinine: 2.71 mg/dL — ABNORMAL HIGH (ref 0.44–1.00)
GFR, Estimated: 19 mL/min — ABNORMAL LOW (ref >60)
Glucose, Bld: 223 mg/dL — ABNORMAL HIGH (ref 70–99)
Potassium: 4.2 mmol/L (ref 3.5–5.1)
Sodium: 135 mmol/L (ref 135–145)
Total Bilirubin: 0.4 mg/dL (ref 0.3–1.2)
Total Protein: 7.7 g/dL (ref 6.5–8.1)

## 2021-01-23 LAB — URIC ACID: Uric Acid, Serum: 6.2 mg/dL (ref 2.5–7.1)

## 2021-01-23 LAB — PHOSPHORUS: Phosphorus: 4 mg/dL (ref 2.5–4.6)

## 2021-01-23 LAB — LACTATE DEHYDROGENASE: LDH: 175 U/L (ref 98–192)

## 2021-01-23 MED ORDER — SODIUM CHLORIDE 0.9 % IV SOLN
300.0000 mg/m2 | Freq: Once | INTRAVENOUS | Status: AC
  Administered 2021-01-23: 600 mg via INTRAVENOUS
  Filled 2021-01-23: qty 30

## 2021-01-23 MED ORDER — BORTEZOMIB CHEMO SQ INJECTION 3.5 MG (2.5MG/ML)
1.5000 mg/m2 | Freq: Once | INTRAMUSCULAR | Status: AC
  Administered 2021-01-23: 3 mg via SUBCUTANEOUS
  Filled 2021-01-23: qty 1.2

## 2021-01-23 MED ORDER — SODIUM CHLORIDE 0.9 % IV SOLN: Freq: Once | INTRAVENOUS | Status: AC

## 2021-01-23 MED ORDER — PALONOSETRON HCL INJECTION 0.25 MG/5ML
0.2500 mg | Freq: Once | INTRAVENOUS | Status: AC
  Administered 2021-01-23: 0.25 mg via INTRAVENOUS
  Filled 2021-01-23: qty 5

## 2021-01-23 NOTE — Progress Notes (Signed)
Per Lorenso Courier MD, ok to treat with elevated SCR

## 2021-01-23 NOTE — Progress Notes (Signed)
Pt states she took her home dose of Decadron today.

## 2021-01-24 ENCOUNTER — Ambulatory Visit: Payer: 59

## 2021-01-25 ENCOUNTER — Ambulatory Visit: Payer: 59

## 2021-01-25 ENCOUNTER — Other Ambulatory Visit: Payer: Self-pay | Admitting: Adult Health

## 2021-01-28 ENCOUNTER — Ambulatory Visit: Payer: 59

## 2021-01-28 ENCOUNTER — Other Ambulatory Visit: Payer: Self-pay | Admitting: *Deleted

## 2021-01-28 MED ORDER — ALLOPURINOL 300 MG PO TABS: 300.0000 mg | ORAL_TABLET | Freq: Every day | ORAL | 3 refills | Status: DC

## 2021-01-29 ENCOUNTER — Encounter: Payer: Self-pay | Admitting: Hematology and Oncology

## 2021-01-29 NOTE — Progress Notes (Signed)
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  Serological testing concerning for a plasma cell neoplasm, with UPEP showing 7.4 g of protein daily with markedly high lambda light chains -11/07/2020: Bone marrow biopsy performed, shows no evidence of multiple myeloma -11/23/2020: Biopsy of right clavicle mass confirmed plasmacytoma.  -11/27/2020: PET scan show intensely hypermetabolic large soft tissue masses arising from the LEFT and RIGHT ribs. Two large lesions in the LEFT chest wall and a single large lesion in the RIGHT chest wall.Hypermetabolic expansile solid masses arising from the medial RIGHT clavicle and RIGHT inferior pubic ischium -01/09/2021: Cycle 1, Day 1 CyBorD  Interval History:  Jordan Gregory 66 y.o. 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 The patient was last seen on 01/09/2021. In the interim since the last visit she started Cycle 1 of CyBorD.   On exam today Jordan Gregory reports she is tolerated her initial treatment quite well with no nausea, vomiting, or diarrhea.  She notes he has been having normal bowel movements.  She denies any numbness or tingling of her fingers and toes and also has had no bleeding, bruising, or dark stools.  She reports that she does feel somewhat tired towards the end of the day with low energy.  She notes that throughout the typical week she tends to "run out  of gas".  She denies any shortness of breath.  Is not having any issues with the injection site of the Velcade.  Patient denies fevers, chills, night sweats, shortness of breath, chest pain or cough.  She has no other complaints.  Rest of the 10 point ROS is below.  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[place])    Thyroid disease    Vitamin D deficiency     SURGICAL HISTORY: Past Surgical History:  Procedure Laterality Date   MOHS SURGERY  2016    SOCIAL HISTORY: Social History   Socioeconomic History   Marital status: Married    Spouse name: Not on file   Number of children: Not on file   Years of education: Not on file   Highest education level: Not on file  Occupational History   Not on file  Tobacco Use   Smoking status: Never   Smokeless tobacco: Never  Vaping Use   Vaping Use: Never used  Substance and Sexual Activity   Alcohol use: Yes    Alcohol/week: 5.0 standard drinks    Types: 5 Standard drinks or equivalent per week    Comment: regular   Drug use: No   Sexual activity: Yes    Birth control/protection: Post-menopausal    Comment: 1st intercourse 82 yo-5 partners  Other Topics Concern   Not on file  Social History Narrative   She works at [employer] and [employer].    Married - 23 years    No kids       She likes to travel, read, cycle, be outside.  Social Determinants of Health   Financial Resource Strain: Not on file  Food Insecurity: Not on file  Transportation Needs: No Transportation Needs   Lack of Transportation (Medical): No   Lack of Transportation (Non-Medical): No  Physical Activity: Not on file  Stress: Not on file  Social Connections: Not on file  Intimate Partner Violence: Not At Risk   Fear of Current or Ex-Partner: No   Emotionally Abused: No   Physically Abused: No   Sexually Abused: No    FAMILY HISTORY: Family History  Problem Relation Age of Onset   Arthritis  Mother    Hypertension Mother    Dementia Mother    Heart disease Father    Liver cancer Father    Asperger's syndrome Brother    Atrial fibrillation Brother     ALLERGIES:  is allergic to ramipril.  MEDICATIONS:  Current Outpatient Medications  Medication Sig Dispense Refill   allopurinol (ZYLOPRIM) 300 MG tablet Take 1 tablet (300 mg total) by mouth daily. 30 tablet 3   acyclovir (ZOVIRAX) 400 MG tablet Take 1 tablet (400 mg total) by mouth 2 (two) times daily. 60 tablet 2   amLODipine (NORVASC) 10 MG tablet Take 10 mg by mouth daily.     calcium carbonate (OS-CAL) 600 MG TABS Take 600 mg by mouth daily.     citalopram (CELEXA) 10 MG tablet Take 1 tablet (10 mg total) by mouth daily. 90 tablet 0   Dexamethasone 20 MG TABS Take 40 mg by mouth once a week. Once a week. 60 tablet 3   levothyroxine (SYNTHROID) 75 MCG tablet TAKE 1 TABLET BY MOUTH DAILY BEFORE BREAKFAST. 90 tablet 3   ondansetron (ZOFRAN) 8 MG tablet Take 1 tablet (8 mg total) by mouth every 8 (eight) hours as needed for nausea or vomiting. 60 tablet 2   prochlorperazine (COMPAZINE) 10 MG tablet Take 1 tablet (10 mg total) by mouth every 6 (six) hours as needed for nausea or vomiting. 60 tablet 2   traZODone (DESYREL) 50 MG tablet TAKE 0.5-1 TABLETS BY MOUTH AT BEDTIME AS NEEDED FOR SLEEP. 90 tablet 1   VITAMIN D, CHOLECALCIFEROL, PO Take 2,000 Int'l Units by mouth daily.     No current facility-administered medications for this visit.    REVIEW OF SYSTEMS:   Constitutional: ( - ) fevers, ( - )  chills , ( - ) night sweats Eyes: ( - ) blurriness of vision, ( - ) double vision, ( - ) watery eyes Ears, nose, mouth, throat, and face: ( - ) mucositis, ( - ) sore throat Respiratory: ( - ) cough, ( - ) dyspnea, ( - ) wheezes Cardiovascular: ( - ) palpitation, ( - ) chest discomfort, ( - ) lower extremity swelling Gastrointestinal:  ( - ) nausea, ( - ) heartburn, ( - ) change in bowel habits Skin: ( - ) abnormal skin  rashes Lymphatics: ( - ) new lymphadenopathy, ( - ) easy bruising Neurological: ( - ) numbness, ( - ) tingling, ( - ) new weaknesses Behavioral/Psych: ( - ) mood change, ( - ) new changes  All other systems were reviewed with the patient and are negative.  PHYSICAL EXAMINATION: ECOG PERFORMANCE STATUS: 1 - Symptomatic but completely ambulatory  Vitals:   01/23/21 1435  BP: (!) 150/74  Pulse: (!) 103  Resp: 16  Temp: (!) 96.9 F (36.1 C)  SpO2: 99%   Filed Weights   01/23/21 1435  Weight: 184 lb 3 oz (83.5  kg)    GENERAL: Well-appearing middle-age Caucasian female, alert, no distress and comfortable SKIN: skin color, texture, turgor are normal, no rashes or significant lesions EYES: conjunctiva are pink and non-injected, sclera clear CHEST: mass over right clavicle, firm LUNGS: clear to auscultation and percussion with normal breathing effort HEART: regular rate & rhythm and no murmurs and no lower extremity edema Musculoskeletal: no cyanosis of digits and no clubbing  PSYCH: alert & oriented x 3, fluent speech NEURO: no focal motor/sensory deficits  LABORATORY DATA:  I have reviewed the data as listed CBC Latest Ref Rng & Units 01/23/2021 01/16/2021 01/09/2021  WBC 4.0 - 10.5 K/uL 3.3(L) 2.6(L) 2.6(L)  Hemoglobin 12.0 - 15.0 g/dL 9.6(L) 9.6(L) 10.1(L)  Hematocrit 36.0 - 46.0 % 28.3(L) 28.5(L) 29.3(L)  Platelets 150 - 400 K/uL 212 186 182    CMP Latest Ref Rng & Units 01/23/2021 01/16/2021 01/09/2021  Glucose 70 - 99 mg/dL 223(H) 119(H) 112(H)  BUN 8 - 23 mg/dL 38(H) 32(H) 36(H)  Creatinine 0.44 - 1.00 mg/dL 2.71(H) 2.70(H) 2.94(H)  Sodium 135 - 145 mmol/L 135 137 137  Potassium 3.5 - 5.1 mmol/L 4.2 4.2 4.1  Chloride 98 - 111 mmol/L 104 105 103  CO2 22 - 32 mmol/L 20(L) 23 24  Calcium 8.9 - 10.3 mg/dL 9.0 9.4 9.6  Total Protein 6.5 - 8.1 g/dL 7.7 7.8 8.0  Total Bilirubin 0.3 - 1.2 mg/dL 0.4 0.5 0.5  Alkaline Phos 38 - 126 U/L 53 46 56  AST 15 - 41 U/L 22 16 27   ALT 0 -  44 U/L 17 17 24     Lab Results  Component Value Date   MPROTEIN 0.3 (H) 01/09/2021   MPROTEIN 0.4 (H) 10/23/2020   Lab Results  Component Value Date   KPAFRELGTCHN 36.3 (H) 01/09/2021   KPAFRELGTCHN 36.6 (H) 10/23/2020   LAMBDASER 5,514.1 (H) 01/09/2021   LAMBDASER 4,355.8 (H) 10/23/2020   KAPLAMBRATIO 0.01 (L) 01/09/2021   KAPLAMBRATIO 0.01 (L) 11/01/2020   KAPLAMBRATIO 0.01 (L) 10/23/2020     RADIOGRAPHIC STUDIES: No results found.  Jordan Gregory 66 y.o. 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  Previously we discussed the diagnosis and treatment options for plasmacytomas without multiple myeloma. This includes radiation therapy verus chemotherapy. Dr. Lorenso Courier discussed case with Dr. Maylene Roes from [facility] and the recommendation was to proceed with chemotherapy due to patients decline in renal function. Patient is scheduled for a consultation with Dr. Maylene Roes on 01/11/2021.   Given the patient's kidney dysfunction at this time the treatment of choice is CyBorD chemotherapy.  We talked about the expected side effects of this regimen and the scheduling.  The patient voiced her understanding of this plan moving forward.   The CyBorD regimen consists of bortezomib 1.5 mg/m2 on days 1, 8, 15, and 22.  Cyclophosphamide 300 mg/m2 IV is administered on days 1, 8, 15, and 22.  Additionally the patient was taking p.o. dexamethasone 40 mg on days 1, 8, 15, and 22.  This is to be continued until the patient reaches a VGPR at which time we could consider transition to bortezomib maintenance.  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   --Serological studies are confirmatory of a plasma cell neoplasm with markedly high lambda light chains and proteinuria.  --Bone marrow biopsy from 11/07/2020 showed no evidence of multiple  myeloma. Right clavicle biopsy from  11/23/2020 confirmed plasma cell neoplasm consistent with plasmacytomas.  --PET scan from 11/27/2020 showed multifocal hypermetabolic soft tissue massing arising from the left and right ribs, left and right chest wall, right clavicle and right inferior pubic ischium. Largest is solid mass in the anterior left chest wall measuring 10.2 x 9.9 cm.  --Recommendation is to initiate systemic chemotherapy due to acute renal dysfunction.  --Treatment of choice is CyBorD which started 01/10/2020.  Plan: --Labs today were reviewed and adequate for treatment today. Patient will proceed with Cycle 1, Day 15 of CyBorD.  --Weekly labs to check CBC, CMP and LDH. Check myeloma labs and UPEP monthly.  --RTC to see Dr. Lorenso Courier on 01/23/21 prior to Cycle 2, Day 1   #Neutropenia/Anemia: --Secondary to underlying malignancy. --Hgb 9.6 and ANC 2.9 today, stable over the last two months.  --Continue to monitor.   #Renal dysfunction: --Secondary to underlying malignancy.  --Creatinine is 2.71, stable  --Continue to monitor.   #Supportive Care -- port placement not required -- zofran 66m q8H PRN and compazine 172mPO q6H for nausea -- acyclovir 4003mO BID for VCZ prophylaxis -- no pain medication required at this time.   No orders of the defined types were placed in this encounter.   All questions were answered. The patient knows to call the clinic with any problems, questions or concerns.  I have spent a total of 30 minutes minutes of face-to-face and non-face-to-face time, preparing to see the patient, performing a medically appropriate examination, counseling and educating the patient, ordering medications, documenting clinical information in the electronic health record,  and care coordination.    JohLedell PeoplesD Department of Hematology/Oncology ConGuadalupe Guerra WesTr[facility] - Saint Josephsone: [phone]: [phone]:  [email or web address]

## 2021-01-30 ENCOUNTER — Inpatient Hospital Stay: Payer: 59

## 2021-01-30 ENCOUNTER — Other Ambulatory Visit: Payer: Self-pay

## 2021-01-30 ENCOUNTER — Other Ambulatory Visit: Payer: Self-pay | Admitting: Hematology and Oncology

## 2021-01-30 VITALS — BP 138/76 | HR 80 | Temp 97.8°F | Resp 16 | Wt 183.0 lb

## 2021-01-30 DIAGNOSIS — Z5111 Encounter for antineoplastic chemotherapy: Secondary | ICD-10-CM | POA: Diagnosis not present

## 2021-01-30 DIAGNOSIS — C9000 Multiple myeloma not having achieved remission: Secondary | ICD-10-CM

## 2021-01-30 LAB — CMP (CANCER CENTER ONLY)
ALT: 14 U/L (ref 0–44)
AST: 18 U/L (ref 15–41)
Albumin: 4.6 g/dL (ref 3.5–5.0)
Alkaline Phosphatase: 54 U/L (ref 38–126)
Anion gap: 8 (ref 5–15)
BUN: 34 mg/dL — ABNORMAL HIGH (ref 8–23)
CO2: 25 mmol/L (ref 22–32)
Calcium: 9.5 mg/dL (ref 8.9–10.3)
Chloride: 106 mmol/L (ref 98–111)
Creatinine: 2.5 mg/dL — ABNORMAL HIGH (ref 0.44–1.00)
GFR, Estimated: 21 mL/min — ABNORMAL LOW (ref >60)
Glucose, Bld: 98 mg/dL (ref 70–99)
Potassium: 4.1 mmol/L (ref 3.5–5.1)
Sodium: 139 mmol/L (ref 135–145)
Total Bilirubin: 0.4 mg/dL (ref 0.3–1.2)
Total Protein: 7.7 g/dL (ref 6.5–8.1)

## 2021-01-30 LAB — CBC WITH DIFFERENTIAL (CANCER CENTER ONLY)
Abs Immature Granulocytes: 0.01 10*3/uL (ref 0.00–0.07)
Basophils Absolute: 0.1 10*3/uL (ref 0.0–0.1)
Basophils Relative: 2 %
Eosinophils Absolute: 0.2 10*3/uL (ref 0.0–0.5)
Eosinophils Relative: 6 %
HCT: 28.7 % — ABNORMAL LOW (ref 36.0–46.0)
Hemoglobin: 9.8 g/dL — ABNORMAL LOW (ref 12.0–15.0)
Immature Granulocytes: 0 %
Lymphocytes Relative: 27 %
Lymphs Abs: 0.7 10*3/uL (ref 0.7–4.0)
MCH: 29.3 pg (ref 26.0–34.0)
MCHC: 34.1 g/dL (ref 30.0–36.0)
MCV: 85.9 fL (ref 80.0–100.0)
Monocytes Absolute: 0.4 10*3/uL (ref 0.1–1.0)
Monocytes Relative: 13 %
Neutro Abs: 1.5 10*3/uL — ABNORMAL LOW (ref 1.7–7.7)
Neutrophils Relative %: 52 %
Platelet Count: 182 10*3/uL (ref 150–400)
RBC: 3.34 MIL/uL — ABNORMAL LOW (ref 3.87–5.11)
RDW: 13.2 % (ref 11.5–15.5)
WBC Count: 2.8 10*3/uL — ABNORMAL LOW (ref 4.0–10.5)
nRBC: 0 % (ref 0.0–0.2)

## 2021-01-30 LAB — LACTATE DEHYDROGENASE: LDH: 201 U/L — ABNORMAL HIGH (ref 98–192)

## 2021-01-30 MED ORDER — BORTEZOMIB CHEMO SQ INJECTION 3.5 MG (2.5MG/ML)
1.5000 mg/m2 | Freq: Once | INTRAMUSCULAR | Status: AC
  Administered 2021-01-30: 15:00:00 3 mg via SUBCUTANEOUS
  Filled 2021-01-30: qty 1.2

## 2021-01-30 MED ORDER — PALONOSETRON HCL INJECTION 0.25 MG/5ML
0.2500 mg | Freq: Once | INTRAVENOUS | Status: AC
  Administered 2021-01-30: 14:00:00 0.25 mg via INTRAVENOUS
  Filled 2021-01-30: qty 5

## 2021-01-30 MED ORDER — SODIUM CHLORIDE 0.9 % IV SOLN
300.0000 mg/m2 | Freq: Once | INTRAVENOUS | Status: AC
  Administered 2021-01-30: 15:00:00 600 mg via INTRAVENOUS
  Filled 2021-01-30: qty 30

## 2021-01-30 MED ORDER — SODIUM CHLORIDE 0.9 % IV SOLN: Freq: Once | INTRAVENOUS | Status: AC

## 2021-01-30 NOTE — Progress Notes (Signed)
Per Dr. Lorenso Courier, ok to treat with elevated scr.

## 2021-01-30 NOTE — Patient Instructions (Signed)
[facility]  Discharge Instructions: Thank you for choosing [facility] to provide your oncology and hematology care.   If you have a lab appointment with the [facility], please go directly to the [facility] and check in at the registration area.   Wear comfortable clothing and clothing appropriate for easy access to any Portacath or PICC line.   We strive to give you quality time with your provider. You may need to reschedule your appointment if you arrive late (15 or more minutes).  Arriving late affects you and other patients whose appointments are after yours.  Also, if you miss three or more appointments without notifying the office, you may be dismissed from the clinic at the providers discretion.      For prescription refill requests, have your pharmacy contact our office and allow 72 hours for refills to be completed.    Today you received the following chemotherapy and/or immunotherapy agents velcade, cytoxan      To help prevent nausea and vomiting after your treatment, we encourage you to take your nausea medication as directed.  BELOW ARE SYMPTOMS THAT SHOULD BE REPORTED IMMEDIATELY: *FEVER GREATER THAN 100.4 F (38 C) OR HIGHER *CHILLS OR SWEATING *NAUSEA AND VOMITING THAT IS NOT CONTROLLED WITH YOUR NAUSEA MEDICATION *UNUSUAL SHORTNESS OF BREATH *UNUSUAL BRUISING OR BLEEDING *URINARY PROBLEMS (pain or burning when urinating, or frequent urination) *BOWEL PROBLEMS (unusual diarrhea, constipation, pain near the anus) TENDERNESS IN MOUTH AND THROAT WITH OR WITHOUT PRESENCE OF ULCERS (sore throat, sores in mouth, or a toothache) UNUSUAL RASH, SWELLING OR PAIN  UNUSUAL VAGINAL DISCHARGE OR ITCHING   Items with * indicate a potential emergency and should be followed up as soon as possible or go to the Emergency Department if any problems should occur.  Please show the CHEMOTHERAPY ALERT CARD or IMMUNOTHERAPY ALERT CARD at  check-in to the Emergency Department and triage nurse.  Should you have questions after your visit or need to cancel or reschedule your appointment, please contact [facility]  Dept: [phone]  and follow the prompts.  Office hours are 8:00 a.m. to 4:30 p.m. Monday - Friday. Please note that voicemails left after 4:00 p.m. may not be returned until the following business day.  We are closed weekends and major holidays. You have access to a nurse at all times for urgent questions. Please call the main number to the clinic Dept: [phone] and follow the prompts.   For any non-urgent questions, you may also contact your provider using MyChart. We now offer e-Visits for anyone 50 and older to request care online for non-urgent symptoms. For details visit [email or web address].   Also download the MyChart app! Go to the app store, search "MyChart", open the app, select [facility], and log in with your MyChart username and password.  Due to Covid, a mask is required upon entering the hospital/clinic. If you do not have a mask, one will be given to you upon arrival. For doctor visits, patients may have 1 support person aged 75 or older with them. For treatment visits, patients cannot have anyone with them due to current Covid guidelines and our immunocompromised population.   Bortezomib injection What is this medication? BORTEZOMIB (bor TEZ oh mib) targets proteins in cancer cells and stops the cancer cells from growing. It treats multiple myeloma and mantle cell lymphoma. This medicine may be used for other purposes; ask your health care provider or pharmacist if you  have questions. COMMON BRAND NAME(S): Velcade What should I tell my care team before I take this medication? They need to know if you have any of these conditions: dehydration diabetes (high blood sugar) heart disease liver disease tingling of the fingers or toes or other nerve disorder an unusual  or allergic reaction to bortezomib, mannitol, boron, other medicines, foods, dyes, or preservatives pregnant or trying to get pregnant breast-feeding How should I use this medication? This medicine is injected into a vein or under the skin. It is given by a health care provider in a hospital or clinic setting. Talk to your health care provider about the use of this medicine in children. Special care may be needed. Overdosage: If you think you have taken too much of this medicine contact a poison control center or emergency room at once. NOTE: This medicine is only for you. Do not share this medicine with others. What if I miss a dose? Keep appointments for follow-up doses. It is important not to miss your dose. Call your health care provider if you are unable to keep an appointment. What may interact with this medication? This medicine may interact with the following medications: ketoconazole rifampin This list may not describe all possible interactions. Give your health care provider a list of all the medicines, herbs, non-prescription drugs, or dietary supplements you use. Also tell them if you smoke, drink alcohol, or use illegal drugs. Some items may interact with your medicine. What should I watch for while using this medication? Your condition will be monitored carefully while you are receiving this medicine. You may need blood work done while you are taking this medicine. You may get drowsy or dizzy. Do not drive, use machinery, or do anything that needs mental alertness until you know how this medicine affects you. Do not stand up or sit up quickly, especially if you are an older patient. This reduces the risk of dizzy or fainting spells This medicine may increase your risk of getting an infection. Call your health care provider for advice if you get a fever, chills, sore throat, or other symptoms of a cold or flu. Do not treat yourself. Try to avoid being around people who are  sick. Check with your health care provider if you have severe diarrhea, nausea, and vomiting, or if you sweat a lot. The loss of too much body fluid may make it dangerous for you to take this medicine. Do not become pregnant while taking this medicine or for 7 months after stopping it. Women should inform their health care provider if they wish to become pregnant or think they might be pregnant. Men should not father a child while taking this medicine and for 4 months after stopping it. There is a potential for serious harm to an unborn child. Talk to your health care provider for more information. Do not breast-feed an infant while taking this medicine or for 2 months after stopping it. This medicine may make it more difficult to get pregnant or father a child. Talk to your health care provider if you are concerned about your fertility. What side effects may I notice from receiving this medication? Side effects that you should report to your doctor or health care professional as soon as possible: allergic reactions (skin rash; itching or hives; swelling of the face, lips, or tongue) bleeding (bloody or black, tarry stools; red or dark brown urine; spitting up blood or brown material that looks like coffee grounds; red spots on the skin;  unusual bruising or bleeding from the eye, gums, or nose) blurred vision or changes in vision confusion constipation headache heart failure (trouble breathing; fast, irregular heartbeat; sudden weight gain; swelling of the ankles, feet, hands) infection (fever, chills, cough, sore throat, pain or trouble passing urine) lack or loss of appetite liver injury (dark yellow or brown urine; general ill feeling or flu-like symptoms; loss of appetite, right upper belly pain; yellowing of the eyes or skin) low blood pressure (dizziness; feeling faint or lightheaded, falls; unusually weak or tired) muscle cramps pain, redness, or irritation at site where injected pain,  tingling, numbness in the hands or feet seizures trouble breathing unusual bruising or bleeding Side effects that usually do not require medical attention (report to your doctor or health care professional if they continue or are bothersome): diarrhea nausea stomach pain trouble sleeping vomiting This list may not describe all possible side effects. Call your doctor for medical advice about side effects. You may report side effects to FDA at 1-800-FDA-1088. Where should I keep my medication? This medicine is given in a hospital or clinic. It will not be stored at home. NOTE: This sheet is a summary. It may not cover all possible information. If you have questions about this medicine, talk to your doctor, pharmacist, or health care provider.  2022 Elsevier/Gold Standard (2019-12-15 00:00:00)  Cyclophosphamide Injection What is this medication? CYCLOPHOSPHAMIDE (sye kloe FOSS fa mide) is a chemotherapy drug. It slows the growth of cancer cells. This medicine is used to treat many types of cancer like lymphoma, myeloma, leukemia, breast cancer, and ovarian cancer, to name a few. This medicine may be used for other purposes; ask your health care provider or pharmacist if you have questions. COMMON BRAND NAME(S): Cytoxan, Neosar What should I tell my care team before I take this medication? They need to know if you have any of these conditions: heart disease history of irregular heartbeat infection kidney disease liver disease low blood counts, like white cells, platelets, or red blood cells on hemodialysis recent or ongoing radiation therapy scarring or thickening of the lungs trouble passing urine an unusual or allergic reaction to cyclophosphamide, other medicines, foods, dyes, or preservatives pregnant or trying to get pregnant breast-feeding How should I use this medication? This drug is usually given as an injection into a vein or muscle or by infusion into a vein. It is  administered in a hospital or clinic by a specially trained health care professional. Talk to your pediatrician regarding the use of this medicine in children. Special care may be needed. Overdosage: If you think you have taken too much of this medicine contact a poison control center or emergency room at once. NOTE: This medicine is only for you. Do not share this medicine with others. What if I miss a dose? It is important not to miss your dose. Call your doctor or health care professional if you are unable to keep an appointment. What may interact with this medication? amphotericin B azathioprine certain antivirals for HIV or hepatitis certain medicines for blood pressure, heart disease, irregular heart beat certain medicines that treat or prevent blood clots like warfarin certain other medicines for cancer cyclosporine etanercept indomethacin medicines that relax muscles for surgery medicines to increase blood counts metronidazole This list may not describe all possible interactions. Give your health care provider a list of all the medicines, herbs, non-prescription drugs, or dietary supplements you use. Also tell them if you smoke, drink alcohol, or use illegal drugs. Some  items may interact with your medicine. What should I watch for while using this medication? Your condition will be monitored carefully while you are receiving this medicine. You may need blood work done while you are taking this medicine. Drink water or other fluids as directed. Urinate often, even at night. Some products may contain alcohol. Ask your health care professional if this medicine contains alcohol. Be sure to tell all health care professionals you are taking this medicine. Certain medicines, like metronidazole and disulfiram, can cause an unpleasant reaction when taken with alcohol. The reaction includes flushing, headache, nausea, vomiting, sweating, and increased thirst. The reaction can last from 30  minutes to several hours. Do not become pregnant while taking this medicine or for 1 year after stopping it. Women should inform their health care professional if they wish to become pregnant or think they might be pregnant. Men should not father a child while taking this medicine and for 4 months after stopping it. There is potential for serious side effects to an unborn child. Talk to your health care professional for more information. Do not breast-feed an infant while taking this medicine or for 1 week after stopping it. This medicine has caused ovarian failure in some women. This medicine may make it more difficult to get pregnant. Talk to your health care professional if you are concerned about your fertility. This medicine has caused decreased sperm counts in some men. This may make it more difficult to father a child. Talk to your health care professional if you are concerned about your fertility. Call your health care professional for advice if you get a fever, chills, or sore throat, or other symptoms of a cold or flu. Do not treat yourself. This medicine decreases your body's ability to fight infections. Try to avoid being around people who are sick. Avoid taking medicines that contain aspirin, acetaminophen, ibuprofen, naproxen, or ketoprofen unless instructed by your health care professional. These medicines may hide a fever. Talk to your health care professional about your risk of cancer. You may be more at risk for certain types of cancer if you take this medicine. If you are going to need surgery or other procedure, tell your health care professional that you are using this medicine. Be careful brushing or flossing your teeth or using a toothpick because you may get an infection or bleed more easily. If you have any dental work done, tell your dentist you are receiving this medicine. What side effects may I notice from receiving this medication? Side effects that you should report to your  doctor or health care professional as soon as possible: allergic reactions like skin rash, itching or hives, swelling of the face, lips, or tongue breathing problems nausea, vomiting signs and symptoms of bleeding such as bloody or black, tarry stools; red or dark brown urine; spitting up blood or brown material that looks like coffee grounds; red spots on the skin; unusual bruising or bleeding from the eyes, gums, or nose signs and symptoms of heart failure like fast, irregular heartbeat, sudden weight gain; swelling of the ankles, feet, hands signs and symptoms of infection like fever; chills; cough; sore throat; pain or trouble passing urine signs and symptoms of kidney injury like trouble passing urine or change in the amount of urine signs and symptoms of liver injury like dark yellow or brown urine; general ill feeling or flu-like symptoms; light-colored stools; loss of appetite; nausea; right upper belly pain; unusually weak or tired; yellowing of the eyes  or skin Side effects that usually do not require medical attention (report to your doctor or health care professional if they continue or are bothersome): confusion decreased hearing diarrhea facial flushing hair loss headache loss of appetite missed menstrual periods signs and symptoms of low red blood cells or anemia such as unusually weak or tired; feeling faint or lightheaded; falls skin discoloration This list may not describe all possible side effects. Call your doctor for medical advice about side effects. You may report side effects to FDA at 1-800-FDA-1088. Where should I keep my medication? This drug is given in a hospital or clinic and will not be stored at home. NOTE: This sheet is a summary. It may not cover all possible information. If you have questions about this medicine, talk to your doctor, pharmacist, or health care provider.  2022 Elsevier/Gold Standard (2020-09-11 00:00:00)

## 2021-01-31 LAB — KAPPA/LAMBDA LIGHT CHAINS
Kappa free light chain: 24.5 mg/L — ABNORMAL HIGH (ref 3.3–19.4)
Kappa, lambda light chain ratio: 0.01 — ABNORMAL LOW (ref 0.26–1.65)
Lambda free light chains: 1995.1 mg/L — ABNORMAL HIGH (ref 5.7–26.3)

## 2021-02-04 LAB — MULTIPLE MYELOMA PANEL, SERUM
Albumin SerPl Elph-Mcnc: 4 g/dL (ref 2.9–4.4)
Albumin/Glob SerPl: 1.3 (ref 0.7–1.7)
Alpha 1: 0.2 g/dL (ref 0.0–0.4)
Alpha2 Glob SerPl Elph-Mcnc: 0.8 g/dL (ref 0.4–1.0)
B-Globulin SerPl Elph-Mcnc: 0.9 g/dL (ref 0.7–1.3)
Gamma Glob SerPl Elph-Mcnc: 1.2 g/dL (ref 0.4–1.8)
Globulin, Total: 3.1 g/dL (ref 2.2–3.9)
IgA: 28 mg/dL — ABNORMAL LOW (ref 87–352)
IgG (Immunoglobin G), Serum: 1140 mg/dL (ref 586–1602)
IgM (Immunoglobulin M), Srm: 23 mg/dL — ABNORMAL LOW (ref 26–217)
M Protein SerPl Elph-Mcnc: 0.1 g/dL — ABNORMAL HIGH
Total Protein ELP: 7.1 g/dL (ref 6.0–8.5)

## 2021-02-06 ENCOUNTER — Inpatient Hospital Stay: Payer: 59 | Attending: Physician Assistant

## 2021-02-06 ENCOUNTER — Inpatient Hospital Stay (HOSPITAL_BASED_OUTPATIENT_CLINIC_OR_DEPARTMENT_OTHER): Payer: 59 | Admitting: Hematology and Oncology

## 2021-02-06 ENCOUNTER — Other Ambulatory Visit: Payer: Self-pay

## 2021-02-06 ENCOUNTER — Inpatient Hospital Stay: Payer: 59

## 2021-02-06 VITALS — BP 137/72 | HR 83 | Temp 97.5°F | Resp 20 | Wt 184.6 lb

## 2021-02-06 DIAGNOSIS — Z5112 Encounter for antineoplastic immunotherapy: Secondary | ICD-10-CM | POA: Insufficient documentation

## 2021-02-06 DIAGNOSIS — Z79899 Other long term (current) drug therapy: Secondary | ICD-10-CM | POA: Insufficient documentation

## 2021-02-06 DIAGNOSIS — C9000 Multiple myeloma not having achieved remission: Secondary | ICD-10-CM | POA: Insufficient documentation

## 2021-02-06 DIAGNOSIS — C9030 Solitary plasmacytoma not having achieved remission: Secondary | ICD-10-CM

## 2021-02-06 DIAGNOSIS — Z5111 Encounter for antineoplastic chemotherapy: Secondary | ICD-10-CM | POA: Diagnosis present

## 2021-02-06 LAB — CBC WITH DIFFERENTIAL (CANCER CENTER ONLY)
Abs Immature Granulocytes: 0.01 10*3/uL (ref 0.00–0.07)
Basophils Absolute: 0.1 10*3/uL (ref 0.0–0.1)
Basophils Relative: 3 %
Eosinophils Absolute: 0.2 10*3/uL (ref 0.0–0.5)
Eosinophils Relative: 8 %
HCT: 27.3 % — ABNORMAL LOW (ref 36.0–46.0)
Hemoglobin: 9.4 g/dL — ABNORMAL LOW (ref 12.0–15.0)
Immature Granulocytes: 1 %
Lymphocytes Relative: 22 %
Lymphs Abs: 0.5 10*3/uL — ABNORMAL LOW (ref 0.7–4.0)
MCH: 29.2 pg (ref 26.0–34.0)
MCHC: 34.4 g/dL (ref 30.0–36.0)
MCV: 84.8 fL (ref 80.0–100.0)
Monocytes Absolute: 0.2 10*3/uL (ref 0.1–1.0)
Monocytes Relative: 7 %
Neutro Abs: 1.3 10*3/uL — ABNORMAL LOW (ref 1.7–7.7)
Neutrophils Relative %: 59 %
Platelet Count: 191 10*3/uL (ref 150–400)
RBC: 3.22 MIL/uL — ABNORMAL LOW (ref 3.87–5.11)
RDW: 13.3 % (ref 11.5–15.5)
WBC Count: 2.1 10*3/uL — ABNORMAL LOW (ref 4.0–10.5)
nRBC: 0 % (ref 0.0–0.2)

## 2021-02-06 LAB — CMP (CANCER CENTER ONLY)
ALT: 14 U/L (ref 0–44)
AST: 18 U/L (ref 15–41)
Albumin: 4.5 g/dL (ref 3.5–5.0)
Alkaline Phosphatase: 54 U/L (ref 38–126)
Anion gap: 9 (ref 5–15)
BUN: 40 mg/dL — ABNORMAL HIGH (ref 8–23)
CO2: 22 mmol/L (ref 22–32)
Calcium: 9.7 mg/dL (ref 8.9–10.3)
Chloride: 106 mmol/L (ref 98–111)
Creatinine: 2.63 mg/dL — ABNORMAL HIGH (ref 0.44–1.00)
GFR, Estimated: 19 mL/min — ABNORMAL LOW (ref >60)
Glucose, Bld: 98 mg/dL (ref 70–99)
Potassium: 4.1 mmol/L (ref 3.5–5.1)
Sodium: 137 mmol/L (ref 135–145)
Total Bilirubin: 0.3 mg/dL (ref 0.3–1.2)
Total Protein: 7.5 g/dL (ref 6.5–8.1)

## 2021-02-06 LAB — LACTATE DEHYDROGENASE: LDH: 234 U/L — ABNORMAL HIGH (ref 98–192)

## 2021-02-06 MED ORDER — SODIUM CHLORIDE 0.9 % IV SOLN: Freq: Once | INTRAVENOUS | Status: DC

## 2021-02-06 MED ORDER — BORTEZOMIB CHEMO SQ INJECTION 3.5 MG (2.5MG/ML)
1.5000 mg/m2 | Freq: Once | INTRAMUSCULAR | Status: AC
  Administered 2021-02-06: 3 mg via SUBCUTANEOUS
  Filled 2021-02-06: qty 1.2

## 2021-02-06 MED ORDER — PALONOSETRON HCL INJECTION 0.25 MG/5ML
0.2500 mg | Freq: Once | INTRAVENOUS | Status: AC
  Administered 2021-02-06: 0.25 mg via INTRAVENOUS
  Filled 2021-02-06: qty 5

## 2021-02-06 MED ORDER — SODIUM CHLORIDE 0.9 % IV SOLN
300.0000 mg/m2 | Freq: Once | INTRAVENOUS | Status: AC
  Administered 2021-02-06: 600 mg via INTRAVENOUS
  Filled 2021-02-06: qty 30

## 2021-02-06 NOTE — Progress Notes (Signed)
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  Serological testing concerning for a plasma cell neoplasm, with UPEP showing 7.4 g of protein daily with markedly high lambda light chains -11/07/2020: Bone marrow biopsy performed, shows no evidence of multiple myeloma -11/23/2020: Biopsy of right clavicle mass confirmed plasmacytoma.  -11/27/2020: PET scan show intensely hypermetabolic large soft tissue masses arising from the LEFT and RIGHT ribs. Two large lesions in the LEFT chest wall and a single large lesion in the RIGHT chest wall.Hypermetabolic expansile solid masses arising from the medial RIGHT clavicle and RIGHT inferior pubic ischium 01/09/2021: Cycle 1, Day 1 CyBorD 02/06/2021: Cycle 2, Day 1 CyBorD  Interval History:  Jordan Gregory 66 y.o. 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 The patient was last seen on 01/23/2021. In the interim since the last visit she completed Cycle 1 of CyBorD.   On exam today Ms. Folta reports she had a rough week last week.  She notes that she was having some issues with a "delicate stomach" but no actual vomiting.  She does endorse having fatigue.  She notes that on Friday she went to [facility] and had an unsettling feeling in her stomach which improved with her as needed nausea medication.  She notes that she did have 1 episode of vomiting in the interim since  her last visit.  She does that she is not having any numbness or tingling of her fingers or toes and is not having any diarrhea, but some constipation.  She notes that she does have some shortness of breath on exertion but otherwise has been stable.  Patient denies fevers, chills, night sweats, chest pain or cough.  She has no other complaints.  Rest of the 10 point ROS is below.  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[place])    Thyroid disease    Vitamin D deficiency     SURGICAL HISTORY: Past Surgical History:  Procedure Laterality Date   MOHS SURGERY  2016    SOCIAL HISTORY: Social History   Socioeconomic History   Marital status: Married    Spouse name: Not on file   Number of children: Not on file   Years of education: Not on file   Highest education level: Not on file  Occupational History   Not on file  Tobacco Use   Smoking status: Never   Smokeless tobacco: Never  Vaping Use   Vaping Use: Never used  Substance and Sexual Activity   Alcohol use: Yes    Alcohol/week: 5.0 standard drinks    Types: 5 Standard drinks or equivalent per week    Comment: regular   Drug use: No   Sexual activity: Yes    Birth control/protection: Post-menopausal    Comment: 1st intercourse 51 yo-5 partners  Other Topics Concern   Not on file  Social History Narrative   She works at [employer] and [employer].    Married -  23 years    No kids       She likes to travel, read, cycle, be outside.    Social Determinants of Health   Financial Resource Strain: Not on file  Food Insecurity: Not on file  Transportation Needs: No Transportation Needs   Lack of Transportation (Medical): No   Lack of Transportation (Non-Medical): No  Physical Activity: Not on file  Stress: Not on file  Social Connections: Not on file  Intimate Partner Violence: Not At Risk   Fear of Current or Ex-Partner: No   Emotionally Abused: No   Physically  Abused: No   Sexually Abused: No    FAMILY HISTORY: Family History  Problem Relation Age of Onset   Arthritis Mother    Hypertension Mother    Dementia Mother    Heart disease Father    Liver cancer Father    Asperger's syndrome Brother    Atrial fibrillation Brother     ALLERGIES:  is allergic to ramipril.  MEDICATIONS:  Current Outpatient Medications  Medication Sig Dispense Refill   acyclovir (ZOVIRAX) 400 MG tablet Take 1 tablet (400 mg total) by mouth 2 (two) times daily. 60 tablet 2   allopurinol (ZYLOPRIM) 300 MG tablet Take 1 tablet (300 mg total) by mouth daily. 30 tablet 3   amLODipine (NORVASC) 10 MG tablet Take 10 mg by mouth daily.     calcium carbonate (OS-CAL) 600 MG TABS Take 600 mg by mouth daily.     citalopram (CELEXA) 10 MG tablet Take 1 tablet (10 mg total) by mouth daily. 90 tablet 0   Dexamethasone 20 MG TABS Take 40 mg by mouth once a week. Once a week. 60 tablet 3   levothyroxine (SYNTHROID) 75 MCG tablet TAKE 1 TABLET BY MOUTH DAILY BEFORE BREAKFAST. 90 tablet 3   ondansetron (ZOFRAN) 8 MG tablet Take 1 tablet (8 mg total) by mouth every 8 (eight) hours as needed for nausea or vomiting. 60 tablet 2   traZODone (DESYREL) 50 MG tablet TAKE 0.5-1 TABLETS BY MOUTH AT BEDTIME AS NEEDED FOR SLEEP. 90 tablet 1   VITAMIN D, CHOLECALCIFEROL, PO Take 2,000 Int'l Units by mouth daily.     prochlorperazine (COMPAZINE) 10 MG tablet Take 1 tablet (10 mg total) by mouth every 6 (six) hours as needed for nausea or vomiting. (Patient not taking: Reported on 02/06/2021) 60 tablet 2   No current facility-administered medications for this visit.   Facility-Administered Medications Ordered in Other Visits  Medication Dose Route Frequency Provider Last Rate Last Admin   0.9 %  sodium chloride infusion   Intravenous Once Orson Slick, MD        REVIEW OF SYSTEMS:   Constitutional: ( - ) fevers, ( - )  chills , ( - ) night sweats Eyes: ( - ) blurriness of vision, ( - )  double vision, ( - ) watery eyes Ears, nose, mouth, throat, and face: ( - ) mucositis, ( - ) sore throat Respiratory: ( - ) cough, ( - ) dyspnea, ( - ) wheezes Cardiovascular: ( - ) palpitation, ( - ) chest discomfort, ( - ) lower extremity swelling Gastrointestinal:  ( - ) nausea, ( - ) heartburn, ( - ) change in bowel habits Skin: ( - ) abnormal skin rashes Lymphatics: ( - ) new lymphadenopathy, ( - ) easy bruising Neurological: ( - ) numbness, ( - ) tingling, ( - ) new weaknesses Behavioral/Psych: ( - ) mood change, ( - )  new changes  All other systems were reviewed with the patient and are negative.  PHYSICAL EXAMINATION: ECOG PERFORMANCE STATUS: 1 - Symptomatic but completely ambulatory  Vitals:   02/06/21 1100  BP: 137/72  Pulse: 83  Resp: 20  Temp: (!) 97.5 F (36.4 C)  SpO2: 100%   Filed Weights   02/06/21 1100  Weight: 184 lb 9.6 oz (83.7 kg)    GENERAL: Well-appearing middle-age Caucasian female, alert, no distress and comfortable SKIN: skin color, texture, turgor are normal, no rashes or significant lesions EYES: conjunctiva are pink and non-injected, sclera clear CHEST: mass over right clavicle, firm LUNGS: clear to auscultation and percussion with normal breathing effort HEART: regular rate & rhythm and no murmurs and no lower extremity edema Musculoskeletal: no cyanosis of digits and no clubbing  PSYCH: alert & oriented x 3, fluent speech NEURO: no focal motor/sensory deficits  LABORATORY DATA:  I have reviewed the data as listed CBC Latest Ref Rng & Units 02/06/2021 01/30/2021 01/23/2021  WBC 4.0 - 10.5 K/uL 2.1(L) 2.8(L) 3.3(L)  Hemoglobin 12.0 - 15.0 g/dL 9.4(L) 9.8(L) 9.6(L)  Hematocrit 36.0 - 46.0 % 27.3(L) 28.7(L) 28.3(L)  Platelets 150 - 400 K/uL 191 182 212    CMP Latest Ref Rng & Units 02/06/2021 01/30/2021 01/23/2021  Glucose 70 - 99 mg/dL 98 98 223(H)  BUN 8 - 23 mg/dL 40(H) 34(H) 38(H)  Creatinine 0.44 - 1.00 mg/dL 2.63(H) 2.50(H) 2.71(H)  Sodium  135 - 145 mmol/L 137 139 135  Potassium 3.5 - 5.1 mmol/L 4.1 4.1 4.2  Chloride 98 - 111 mmol/L 106 106 104  CO2 22 - 32 mmol/L 22 25 20(L)  Calcium 8.9 - 10.3 mg/dL 9.7 9.5 9.0  Total Protein 6.5 - 8.1 g/dL 7.5 7.7 7.7  Total Bilirubin 0.3 - 1.2 mg/dL 0.3 0.4 0.4  Alkaline Phos 38 - 126 U/L 54 54 53  AST 15 - 41 U/L 18 18 22   ALT 0 - 44 U/L 14 14 17     Lab Results  Component Value Date   MPROTEIN 0.1 (H) 01/30/2021   MPROTEIN 0.3 (H) 01/09/2021   MPROTEIN 0.4 (H) 10/23/2020   Lab Results  Component Value Date   KPAFRELGTCHN 24.5 (H) 01/30/2021   KPAFRELGTCHN 36.3 (H) 01/09/2021   KPAFRELGTCHN 36.6 (H) 10/23/2020   LAMBDASER 1,995.1 (H) 01/30/2021   LAMBDASER 5,514.1 (H) 01/09/2021   LAMBDASER 4,355.8 (H) 10/23/2020   KAPLAMBRATIO 0.01 (L) 01/30/2021   KAPLAMBRATIO 0.01 (L) 01/09/2021   KAPLAMBRATIO 0.01 (L) 11/01/2020     RADIOGRAPHIC STUDIES: No results found.  Jordan Gregory 66 y.o. 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  Previously we discussed the diagnosis and treatment options for plasmacytomas without multiple myeloma. This includes radiation therapy verus chemotherapy. Dr. Lorenso Courier discussed case with Dr. Maylene Roes from [facility] and the recommendation was to proceed with chemotherapy due to patients decline in renal function. Patient is scheduled for a consultation with Dr. Maylene Roes on 01/11/2021.   Given the patient's kidney dysfunction at this time the treatment of choice is CyBorD chemotherapy.  We talked about the expected side effects of this regimen and the scheduling.  The patient voiced her understanding of this plan moving forward.   The CyBorD regimen consists of bortezomib 1.5 mg/m2 on days 1, 8, 15, and 22.  Cyclophosphamide 300 mg/m2 IV is administered on days 1, 8, 15, and 22.  Additionally the patient was taking p.o. dexamethasone  40 mg on days 1, 8, 15, and 22.   This is to be continued until the patient reaches a VGPR at which time we could consider transition to bortezomib maintenance.  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   --Serological studies are confirmatory of a plasma cell neoplasm with markedly high lambda light chains and proteinuria.  --Bone marrow biopsy from 11/07/2020 showed no evidence of multiple myeloma. Right clavicle biopsy from 11/23/2020 confirmed plasma cell neoplasm consistent with plasmacytomas.  --PET scan from 11/27/2020 showed multifocal hypermetabolic soft tissue massing arising from the left and right ribs, left and right chest wall, right clavicle and right inferior pubic ischium. Largest is solid mass in the anterior left chest wall measuring 10.2 x 9.9 cm.  --Recommendation is to initiate systemic chemotherapy due to acute renal dysfunction.  --Treatment of choice is CyBorD which started 01/10/2020.  Plan: --Labs today were reviewed and adequate for treatment today. Patient will proceed with Cycle 2, Day 1 of CyBorD today --Weekly labs to check CBC, CMP and LDH. Check myeloma labs and UPEP monthly.  --RTC in 2 weeks Cycle 2, Day 15 with continued weekly treatment  #Neutropenia/Anemia: --Secondary to underlying malignancy. --Hgb 9.4 and ANC 1.3 today, stable over the last two months.  --Continue to monitor.   #Renal dysfunction: --Secondary to underlying malignancy.  --Creatinine is 2.63, stable  --Continue to monitor.   #Supportive Care -- port placement not required -- zofran 75m q8H PRN and compazine 156mPO q6H for nausea -- acyclovir 40033mO BID for VCZ prophylaxis -- no pain medication required at this time.   No orders of the defined types were placed in this encounter.   All questions were answered. The patient knows to call the clinic with any problems, questions or concerns.  I have spent a total of 30 minutes  minutes of face-to-face and non-face-to-face time, preparing to see the patient, performing a medically appropriate examination, counseling and educating the patient, ordering medications, documenting clinical information in the electronic health record,  and care coordination.    JohLedell PeoplesD Department of Hematology/Oncology [facility]: [phone]: [phone]: [email or web address]

## 2021-02-06 NOTE — Patient Instructions (Signed)
[facility]  Discharge Instructions: Thank you for choosing [facility] to provide your oncology and hematology care.   If you have a lab appointment with the [facility], please go directly to the [facility] and check in at the registration area.   Wear comfortable clothing and clothing appropriate for easy access to any Portacath or PICC line.   We strive to give you quality time with your provider. You may need to reschedule your appointment if you arrive late (15 or more minutes).  Arriving late affects you and other patients whose appointments are after yours.  Also, if you miss three or more appointments without notifying the office, you may be dismissed from the clinic at the providers discretion.      For prescription refill requests, have your pharmacy contact our office and allow 72 hours for refills to be completed.    Today you received the following chemotherapy and/or immunotherapy agents velcade, cytoxan      To help prevent nausea and vomiting after your treatment, we encourage you to take your nausea medication as directed.  BELOW ARE SYMPTOMS THAT SHOULD BE REPORTED IMMEDIATELY: *FEVER GREATER THAN 100.4 F (38 C) OR HIGHER *CHILLS OR SWEATING *NAUSEA AND VOMITING THAT IS NOT CONTROLLED WITH YOUR NAUSEA MEDICATION *UNUSUAL SHORTNESS OF BREATH *UNUSUAL BRUISING OR BLEEDING *URINARY PROBLEMS (pain or burning when urinating, or frequent urination) *BOWEL PROBLEMS (unusual diarrhea, constipation, pain near the anus) TENDERNESS IN MOUTH AND THROAT WITH OR WITHOUT PRESENCE OF ULCERS (sore throat, sores in mouth, or a toothache) UNUSUAL RASH, SWELLING OR PAIN  UNUSUAL VAGINAL DISCHARGE OR ITCHING   Items with * indicate a potential emergency and should be followed up as soon as possible or go to the Emergency Department if any problems should occur.  Please show the CHEMOTHERAPY ALERT CARD or IMMUNOTHERAPY ALERT CARD at  check-in to the Emergency Department and triage nurse.  Should you have questions after your visit or need to cancel or reschedule your appointment, please contact [facility]  Dept: [phone]  and follow the prompts.  Office hours are 8:00 a.m. to 4:30 p.m. Monday - Friday. Please note that voicemails left after 4:00 p.m. may not be returned until the following business day.  We are closed weekends and major holidays. You have access to a nurse at all times for urgent questions. Please call the main number to the clinic Dept: [phone] and follow the prompts.   For any non-urgent questions, you may also contact your provider using MyChart. We now offer e-Visits for anyone 71 and older to request care online for non-urgent symptoms. For details visit [email or web address].   Also download the MyChart app! Go to the app store, search "MyChart", open the app, select [facility], and log in with your MyChart username and password.  Due to Covid, a mask is required upon entering the hospital/clinic. If you do not have a mask, one will be given to you upon arrival. For doctor visits, patients may have 1 support person aged 36 or older with them. For treatment visits, patients cannot have anyone with them due to current Covid guidelines and our immunocompromised population.   Bortezomib injection What is this medication? BORTEZOMIB (bor TEZ oh mib) targets proteins in cancer cells and stops the cancer cells from growing. It treats multiple myeloma and mantle cell lymphoma. This medicine may be used for other purposes; ask your health care provider or pharmacist if you  have questions. COMMON BRAND NAME(S): Velcade What should I tell my care team before I take this medication? They need to know if you have any of these conditions: dehydration diabetes (high blood sugar) heart disease liver disease tingling of the fingers or toes or other nerve disorder an unusual  or allergic reaction to bortezomib, mannitol, boron, other medicines, foods, dyes, or preservatives pregnant or trying to get pregnant breast-feeding How should I use this medication? This medicine is injected into a vein or under the skin. It is given by a health care provider in a hospital or clinic setting. Talk to your health care provider about the use of this medicine in children. Special care may be needed. Overdosage: If you think you have taken too much of this medicine contact a poison control center or emergency room at once. NOTE: This medicine is only for you. Do not share this medicine with others. What if I miss a dose? Keep appointments for follow-up doses. It is important not to miss your dose. Call your health care provider if you are unable to keep an appointment. What may interact with this medication? This medicine may interact with the following medications: ketoconazole rifampin This list may not describe all possible interactions. Give your health care provider a list of all the medicines, herbs, non-prescription drugs, or dietary supplements you use. Also tell them if you smoke, drink alcohol, or use illegal drugs. Some items may interact with your medicine. What should I watch for while using this medication? Your condition will be monitored carefully while you are receiving this medicine. You may need blood work done while you are taking this medicine. You may get drowsy or dizzy. Do not drive, use machinery, or do anything that needs mental alertness until you know how this medicine affects you. Do not stand up or sit up quickly, especially if you are an older patient. This reduces the risk of dizzy or fainting spells This medicine may increase your risk of getting an infection. Call your health care provider for advice if you get a fever, chills, sore throat, or other symptoms of a cold or flu. Do not treat yourself. Try to avoid being around people who are  sick. Check with your health care provider if you have severe diarrhea, nausea, and vomiting, or if you sweat a lot. The loss of too much body fluid may make it dangerous for you to take this medicine. Do not become pregnant while taking this medicine or for 7 months after stopping it. Women should inform their health care provider if they wish to become pregnant or think they might be pregnant. Men should not father a child while taking this medicine and for 4 months after stopping it. There is a potential for serious harm to an unborn child. Talk to your health care provider for more information. Do not breast-feed an infant while taking this medicine or for 2 months after stopping it. This medicine may make it more difficult to get pregnant or father a child. Talk to your health care provider if you are concerned about your fertility. What side effects may I notice from receiving this medication? Side effects that you should report to your doctor or health care professional as soon as possible: allergic reactions (skin rash; itching or hives; swelling of the face, lips, or tongue) bleeding (bloody or black, tarry stools; red or dark brown urine; spitting up blood or brown material that looks like coffee grounds; red spots on the skin;  unusual bruising or bleeding from the eye, gums, or nose) blurred vision or changes in vision confusion constipation headache heart failure (trouble breathing; fast, irregular heartbeat; sudden weight gain; swelling of the ankles, feet, hands) infection (fever, chills, cough, sore throat, pain or trouble passing urine) lack or loss of appetite liver injury (dark yellow or brown urine; general ill feeling or flu-like symptoms; loss of appetite, right upper belly pain; yellowing of the eyes or skin) low blood pressure (dizziness; feeling faint or lightheaded, falls; unusually weak or tired) muscle cramps pain, redness, or irritation at site where injected pain,  tingling, numbness in the hands or feet seizures trouble breathing unusual bruising or bleeding Side effects that usually do not require medical attention (report to your doctor or health care professional if they continue or are bothersome): diarrhea nausea stomach pain trouble sleeping vomiting This list may not describe all possible side effects. Call your doctor for medical advice about side effects. You may report side effects to FDA at 1-800-FDA-1088. Where should I keep my medication? This medicine is given in a hospital or clinic. It will not be stored at home. NOTE: This sheet is a summary. It may not cover all possible information. If you have questions about this medicine, talk to your doctor, pharmacist, or health care provider.  2022 Elsevier/Gold Standard (2019-12-15 00:00:00)  Cyclophosphamide Injection What is this medication? CYCLOPHOSPHAMIDE (sye kloe FOSS fa mide) is a chemotherapy drug. It slows the growth of cancer cells. This medicine is used to treat many types of cancer like lymphoma, myeloma, leukemia, breast cancer, and ovarian cancer, to name a few. This medicine may be used for other purposes; ask your health care provider or pharmacist if you have questions. COMMON BRAND NAME(S): Cytoxan, Neosar What should I tell my care team before I take this medication? They need to know if you have any of these conditions: heart disease history of irregular heartbeat infection kidney disease liver disease low blood counts, like white cells, platelets, or red blood cells on hemodialysis recent or ongoing radiation therapy scarring or thickening of the lungs trouble passing urine an unusual or allergic reaction to cyclophosphamide, other medicines, foods, dyes, or preservatives pregnant or trying to get pregnant breast-feeding How should I use this medication? This drug is usually given as an injection into a vein or muscle or by infusion into a vein. It is  administered in a hospital or clinic by a specially trained health care professional. Talk to your pediatrician regarding the use of this medicine in children. Special care may be needed. Overdosage: If you think you have taken too much of this medicine contact a poison control center or emergency room at once. NOTE: This medicine is only for you. Do not share this medicine with others. What if I miss a dose? It is important not to miss your dose. Call your doctor or health care professional if you are unable to keep an appointment. What may interact with this medication? amphotericin B azathioprine certain antivirals for HIV or hepatitis certain medicines for blood pressure, heart disease, irregular heart beat certain medicines that treat or prevent blood clots like warfarin certain other medicines for cancer cyclosporine etanercept indomethacin medicines that relax muscles for surgery medicines to increase blood counts metronidazole This list may not describe all possible interactions. Give your health care provider a list of all the medicines, herbs, non-prescription drugs, or dietary supplements you use. Also tell them if you smoke, drink alcohol, or use illegal drugs. Some  items may interact with your medicine. What should I watch for while using this medication? Your condition will be monitored carefully while you are receiving this medicine. You may need blood work done while you are taking this medicine. Drink water or other fluids as directed. Urinate often, even at night. Some products may contain alcohol. Ask your health care professional if this medicine contains alcohol. Be sure to tell all health care professionals you are taking this medicine. Certain medicines, like metronidazole and disulfiram, can cause an unpleasant reaction when taken with alcohol. The reaction includes flushing, headache, nausea, vomiting, sweating, and increased thirst. The reaction can last from 30  minutes to several hours. Do not become pregnant while taking this medicine or for 1 year after stopping it. Women should inform their health care professional if they wish to become pregnant or think they might be pregnant. Men should not father a child while taking this medicine and for 4 months after stopping it. There is potential for serious side effects to an unborn child. Talk to your health care professional for more information. Do not breast-feed an infant while taking this medicine or for 1 week after stopping it. This medicine has caused ovarian failure in some women. This medicine may make it more difficult to get pregnant. Talk to your health care professional if you are concerned about your fertility. This medicine has caused decreased sperm counts in some men. This may make it more difficult to father a child. Talk to your health care professional if you are concerned about your fertility. Call your health care professional for advice if you get a fever, chills, or sore throat, or other symptoms of a cold or flu. Do not treat yourself. This medicine decreases your body's ability to fight infections. Try to avoid being around people who are sick. Avoid taking medicines that contain aspirin, acetaminophen, ibuprofen, naproxen, or ketoprofen unless instructed by your health care professional. These medicines may hide a fever. Talk to your health care professional about your risk of cancer. You may be more at risk for certain types of cancer if you take this medicine. If you are going to need surgery or other procedure, tell your health care professional that you are using this medicine. Be careful brushing or flossing your teeth or using a toothpick because you may get an infection or bleed more easily. If you have any dental work done, tell your dentist you are receiving this medicine. What side effects may I notice from receiving this medication? Side effects that you should report to your  doctor or health care professional as soon as possible: allergic reactions like skin rash, itching or hives, swelling of the face, lips, or tongue breathing problems nausea, vomiting signs and symptoms of bleeding such as bloody or black, tarry stools; red or dark brown urine; spitting up blood or brown material that looks like coffee grounds; red spots on the skin; unusual bruising or bleeding from the eyes, gums, or nose signs and symptoms of heart failure like fast, irregular heartbeat, sudden weight gain; swelling of the ankles, feet, hands signs and symptoms of infection like fever; chills; cough; sore throat; pain or trouble passing urine signs and symptoms of kidney injury like trouble passing urine or change in the amount of urine signs and symptoms of liver injury like dark yellow or brown urine; general ill feeling or flu-like symptoms; light-colored stools; loss of appetite; nausea; right upper belly pain; unusually weak or tired; yellowing of the eyes  or skin Side effects that usually do not require medical attention (report to your doctor or health care professional if they continue or are bothersome): confusion decreased hearing diarrhea facial flushing hair loss headache loss of appetite missed menstrual periods signs and symptoms of low red blood cells or anemia such as unusually weak or tired; feeling faint or lightheaded; falls skin discoloration This list may not describe all possible side effects. Call your doctor for medical advice about side effects. You may report side effects to FDA at 1-800-FDA-1088. Where should I keep my medication? This drug is given in a hospital or clinic and will not be stored at home. NOTE: This sheet is a summary. It may not cover all possible information. If you have questions about this medicine, talk to your doctor, pharmacist, or health care provider.  2022 Elsevier/Gold Standard (2020-09-11 00:00:00)

## 2021-02-06 NOTE — Progress Notes (Signed)
ok to treat today with anc 1.3 and scr of 2.63 per Dr. Lorenso Courier

## 2021-02-13 ENCOUNTER — Other Ambulatory Visit: Payer: 59

## 2021-02-13 ENCOUNTER — Ambulatory Visit: Payer: 59

## 2021-02-13 ENCOUNTER — Inpatient Hospital Stay: Payer: 59

## 2021-02-13 ENCOUNTER — Ambulatory Visit: Payer: 59 | Admitting: Hematology and Oncology

## 2021-02-13 ENCOUNTER — Other Ambulatory Visit: Payer: Self-pay

## 2021-02-13 VITALS — BP 134/78 | HR 90 | Temp 97.8°F | Resp 16 | Ht 67.0 in | Wt 182.8 lb

## 2021-02-13 DIAGNOSIS — C9000 Multiple myeloma not having achieved remission: Secondary | ICD-10-CM

## 2021-02-13 DIAGNOSIS — Z5111 Encounter for antineoplastic chemotherapy: Secondary | ICD-10-CM | POA: Diagnosis not present

## 2021-02-13 LAB — CBC WITH DIFFERENTIAL (CANCER CENTER ONLY)
Abs Immature Granulocytes: 0.03 10*3/uL (ref 0.00–0.07)
Basophils Absolute: 0 10*3/uL (ref 0.0–0.1)
Basophils Relative: 2 %
Eosinophils Absolute: 0.1 10*3/uL (ref 0.0–0.5)
Eosinophils Relative: 3 %
HCT: 27.4 % — ABNORMAL LOW (ref 36.0–46.0)
Hemoglobin: 9.5 g/dL — ABNORMAL LOW (ref 12.0–15.0)
Immature Granulocytes: 1 %
Lymphocytes Relative: 9 %
Lymphs Abs: 0.2 10*3/uL — ABNORMAL LOW (ref 0.7–4.0)
MCH: 29.3 pg (ref 26.0–34.0)
MCHC: 34.7 g/dL (ref 30.0–36.0)
MCV: 84.6 fL (ref 80.0–100.0)
Monocytes Absolute: 0.2 10*3/uL (ref 0.1–1.0)
Monocytes Relative: 6 %
Neutro Abs: 2.1 10*3/uL (ref 1.7–7.7)
Neutrophils Relative %: 79 %
Platelet Count: 160 10*3/uL (ref 150–400)
RBC: 3.24 MIL/uL — ABNORMAL LOW (ref 3.87–5.11)
RDW: 13.6 % (ref 11.5–15.5)
WBC Count: 2.6 10*3/uL — ABNORMAL LOW (ref 4.0–10.5)
nRBC: 0 % (ref 0.0–0.2)

## 2021-02-13 LAB — CMP (CANCER CENTER ONLY)
ALT: 17 U/L (ref 0–44)
AST: 18 U/L (ref 15–41)
Albumin: 4.7 g/dL (ref 3.5–5.0)
Alkaline Phosphatase: 58 U/L (ref 38–126)
Anion gap: 10 (ref 5–15)
BUN: 34 mg/dL — ABNORMAL HIGH (ref 8–23)
CO2: 24 mmol/L (ref 22–32)
Calcium: 9.5 mg/dL (ref 8.9–10.3)
Chloride: 104 mmol/L (ref 98–111)
Creatinine: 2.38 mg/dL — ABNORMAL HIGH (ref 0.44–1.00)
GFR, Estimated: 22 mL/min — ABNORMAL LOW (ref >60)
Glucose, Bld: 116 mg/dL — ABNORMAL HIGH (ref 70–99)
Potassium: 4.2 mmol/L (ref 3.5–5.1)
Sodium: 138 mmol/L (ref 135–145)
Total Bilirubin: 0.4 mg/dL (ref 0.3–1.2)
Total Protein: 7.5 g/dL (ref 6.5–8.1)

## 2021-02-13 LAB — LACTATE DEHYDROGENASE: LDH: 201 U/L — ABNORMAL HIGH (ref 98–192)

## 2021-02-13 MED ORDER — SODIUM CHLORIDE 0.9 % IV SOLN: Freq: Once | INTRAVENOUS | Status: AC

## 2021-02-13 MED ORDER — BORTEZOMIB CHEMO SQ INJECTION 3.5 MG (2.5MG/ML)
1.5000 mg/m2 | Freq: Once | INTRAMUSCULAR | Status: AC
  Administered 2021-02-13: 3 mg via SUBCUTANEOUS
  Filled 2021-02-13: qty 1.2

## 2021-02-13 MED ORDER — PALONOSETRON HCL INJECTION 0.25 MG/5ML
0.2500 mg | Freq: Once | INTRAVENOUS | Status: AC
  Administered 2021-02-13: 0.25 mg via INTRAVENOUS
  Filled 2021-02-13: qty 5

## 2021-02-13 MED ORDER — SODIUM CHLORIDE 0.9 % IV SOLN
300.0000 mg/m2 | Freq: Once | INTRAVENOUS | Status: AC
  Administered 2021-02-13: 600 mg via INTRAVENOUS
  Filled 2021-02-13: qty 30

## 2021-02-13 NOTE — Progress Notes (Signed)
Ok to treat today with Scr 1.38 per Dr Lorenso Courier. Patient reports she has taken her Dexamethasone today prior to arrival

## 2021-02-13 NOTE — Patient Instructions (Signed)
[facility]   Discharge Instructions: Thank you for choosing [facility] to provide your oncology and hematology care.   If you have a lab appointment with the [facility], please go directly to the [facility] and check in at the registration area.   Wear comfortable clothing and clothing appropriate for easy access to any Portacath or PICC line.   We strive to give you quality time with your provider. You may need to reschedule your appointment if you arrive late (15 or more minutes).  Arriving late affects you and other patients whose appointments are after yours.  Also, if you miss three or more appointments without notifying the office, you may be dismissed from the clinic at the providers discretion.      For prescription refill requests, have your pharmacy contact our office and allow 72 hours for refills to be completed.    Today you received the following chemotherapy and/or immunotherapy agents: bortezomib and cyclophosphamide      To help prevent nausea and vomiting after your treatment, we encourage you to take your nausea medication as directed.  BELOW ARE SYMPTOMS THAT SHOULD BE REPORTED IMMEDIATELY: *FEVER GREATER THAN 100.4 F (38 C) OR HIGHER *CHILLS OR SWEATING *NAUSEA AND VOMITING THAT IS NOT CONTROLLED WITH YOUR NAUSEA MEDICATION *UNUSUAL SHORTNESS OF BREATH *UNUSUAL BRUISING OR BLEEDING *URINARY PROBLEMS (pain or burning when urinating, or frequent urination) *BOWEL PROBLEMS (unusual diarrhea, constipation, pain near the anus) TENDERNESS IN MOUTH AND THROAT WITH OR WITHOUT PRESENCE OF ULCERS (sore throat, sores in mouth, or a toothache) UNUSUAL RASH, SWELLING OR PAIN  UNUSUAL VAGINAL DISCHARGE OR ITCHING   Items with * indicate a potential emergency and should be followed up as soon as possible or go to the Emergency Department if any problems should occur.  Please show the CHEMOTHERAPY ALERT CARD or IMMUNOTHERAPY  ALERT CARD at check-in to the Emergency Department and triage nurse.  Should you have questions after your visit or need to cancel or reschedule your appointment, please contact [facility]  Dept: [phone]  and follow the prompts.  Office hours are 8:00 a.m. to 4:30 p.m. Monday - Friday. Please note that voicemails left after 4:00 p.m. may not be returned until the following business day.  We are closed weekends and major holidays. You have access to a nurse at all times for urgent questions. Please call the main number to the clinic Dept: [phone] and follow the prompts.   For any non-urgent questions, you may also contact your provider using MyChart. We now offer e-Visits for anyone 35 and older to request care online for non-urgent symptoms. For details visit [email or web address].   Also download the MyChart app! Go to the app store, search "MyChart", open the app, select [facility], and log in with your MyChart username and password.  Due to Covid, a mask is required upon entering the hospital/clinic. If you do not have a mask, one will be given to you upon arrival. For doctor visits, patients may have 1 support person aged 20 or older with them. For treatment visits, patients cannot have anyone with them due to current Covid guidelines and our immunocompromised population.

## 2021-02-14 ENCOUNTER — Telehealth: Payer: Self-pay | Admitting: *Deleted

## 2021-02-14 NOTE — Telephone Encounter (Signed)
Received letter from pt's dentist with dental clearance for Zometa. Scanned to chart

## 2021-02-20 ENCOUNTER — Inpatient Hospital Stay: Payer: 59

## 2021-02-20 ENCOUNTER — Other Ambulatory Visit: Payer: Self-pay

## 2021-02-20 VITALS — BP 147/75 | HR 89 | Temp 98.4°F | Resp 18 | Wt 187.0 lb

## 2021-02-20 DIAGNOSIS — C9000 Multiple myeloma not having achieved remission: Secondary | ICD-10-CM

## 2021-02-20 DIAGNOSIS — Z5111 Encounter for antineoplastic chemotherapy: Secondary | ICD-10-CM | POA: Diagnosis not present

## 2021-02-20 LAB — CBC WITH DIFFERENTIAL (CANCER CENTER ONLY)
Abs Immature Granulocytes: 0.05 10*3/uL (ref 0.00–0.07)
Basophils Absolute: 0.1 10*3/uL (ref 0.0–0.1)
Basophils Relative: 2 %
Eosinophils Absolute: 0.1 10*3/uL (ref 0.0–0.5)
Eosinophils Relative: 2 %
HCT: 27.4 % — ABNORMAL LOW (ref 36.0–46.0)
Hemoglobin: 9.3 g/dL — ABNORMAL LOW (ref 12.0–15.0)
Immature Granulocytes: 2 %
Lymphocytes Relative: 7 %
Lymphs Abs: 0.2 10*3/uL — ABNORMAL LOW (ref 0.7–4.0)
MCH: 29.6 pg (ref 26.0–34.0)
MCHC: 33.9 g/dL (ref 30.0–36.0)
MCV: 87.3 fL (ref 80.0–100.0)
Monocytes Absolute: 0.1 10*3/uL (ref 0.1–1.0)
Monocytes Relative: 3 %
Neutro Abs: 2.6 10*3/uL (ref 1.7–7.7)
Neutrophils Relative %: 84 %
Platelet Count: 168 10*3/uL (ref 150–400)
RBC: 3.14 MIL/uL — ABNORMAL LOW (ref 3.87–5.11)
RDW: 14.3 % (ref 11.5–15.5)
WBC Count: 3.1 10*3/uL — ABNORMAL LOW (ref 4.0–10.5)
nRBC: 0 % (ref 0.0–0.2)

## 2021-02-20 LAB — CMP (CANCER CENTER ONLY)
ALT: 15 U/L (ref 0–44)
AST: 16 U/L (ref 15–41)
Albumin: 4.5 g/dL (ref 3.5–5.0)
Alkaline Phosphatase: 61 U/L (ref 38–126)
Anion gap: 10 (ref 5–15)
BUN: 27 mg/dL — ABNORMAL HIGH (ref 8–23)
CO2: 22 mmol/L (ref 22–32)
Calcium: 9.4 mg/dL (ref 8.9–10.3)
Chloride: 106 mmol/L (ref 98–111)
Creatinine: 2.1 mg/dL — ABNORMAL HIGH (ref 0.44–1.00)
GFR, Estimated: 26 mL/min — ABNORMAL LOW (ref >60)
Glucose, Bld: 148 mg/dL — ABNORMAL HIGH (ref 70–99)
Potassium: 4.1 mmol/L (ref 3.5–5.1)
Sodium: 138 mmol/L (ref 135–145)
Total Bilirubin: 0.4 mg/dL (ref 0.3–1.2)
Total Protein: 7.1 g/dL (ref 6.5–8.1)

## 2021-02-20 LAB — LACTATE DEHYDROGENASE: LDH: 192 U/L (ref 98–192)

## 2021-02-20 MED ORDER — SODIUM CHLORIDE 0.9 % IV SOLN: Freq: Once | INTRAVENOUS | Status: AC

## 2021-02-20 MED ORDER — SODIUM CHLORIDE 0.9 % IV SOLN
300.0000 mg/m2 | Freq: Once | INTRAVENOUS | Status: AC
  Administered 2021-02-20: 600 mg via INTRAVENOUS
  Filled 2021-02-20: qty 30

## 2021-02-20 MED ORDER — PALONOSETRON HCL INJECTION 0.25 MG/5ML
0.2500 mg | Freq: Once | INTRAVENOUS | Status: AC
  Administered 2021-02-20: 0.25 mg via INTRAVENOUS
  Filled 2021-02-20: qty 5

## 2021-02-20 MED ORDER — BORTEZOMIB CHEMO SQ INJECTION 3.5 MG (2.5MG/ML)
1.5000 mg/m2 | Freq: Once | INTRAMUSCULAR | Status: AC
  Administered 2021-02-20: 3 mg via SUBCUTANEOUS
  Filled 2021-02-20: qty 1.2

## 2021-02-20 NOTE — Progress Notes (Signed)
Per Dr. Lorenso Courier, ok to treat with scr 2.1 mg/dL.

## 2021-02-20 NOTE — Patient Instructions (Signed)
[facility]   Discharge Instructions: Thank you for choosing [facility] to provide your oncology and hematology care.   If you have a lab appointment with the [facility], please go directly to the [facility] and check in at the registration area.   Wear comfortable clothing and clothing appropriate for easy access to any Portacath or PICC line.   We strive to give you quality time with your provider. You may need to reschedule your appointment if you arrive late (15 or more minutes).  Arriving late affects you and other patients whose appointments are after yours.  Also, if you miss three or more appointments without notifying the office, you may be dismissed from the clinic at the providers discretion.      For prescription refill requests, have your pharmacy contact our office and allow 72 hours for refills to be completed.    Today you received the following chemotherapy and/or immunotherapy agents: bortezomib and cyclophosphamide      To help prevent nausea and vomiting after your treatment, we encourage you to take your nausea medication as directed.  BELOW ARE SYMPTOMS THAT SHOULD BE REPORTED IMMEDIATELY: *FEVER GREATER THAN 100.4 F (38 C) OR HIGHER *CHILLS OR SWEATING *NAUSEA AND VOMITING THAT IS NOT CONTROLLED WITH YOUR NAUSEA MEDICATION *UNUSUAL SHORTNESS OF BREATH *UNUSUAL BRUISING OR BLEEDING *URINARY PROBLEMS (pain or burning when urinating, or frequent urination) *BOWEL PROBLEMS (unusual diarrhea, constipation, pain near the anus) TENDERNESS IN MOUTH AND THROAT WITH OR WITHOUT PRESENCE OF ULCERS (sore throat, sores in mouth, or a toothache) UNUSUAL RASH, SWELLING OR PAIN  UNUSUAL VAGINAL DISCHARGE OR ITCHING   Items with * indicate a potential emergency and should be followed up as soon as possible or go to the Emergency Department if any problems should occur.  Please show the CHEMOTHERAPY ALERT CARD or IMMUNOTHERAPY  ALERT CARD at check-in to the Emergency Department and triage nurse.  Should you have questions after your visit or need to cancel or reschedule your appointment, please contact [facility]  Dept: [phone]  and follow the prompts.  Office hours are 8:00 a.m. to 4:30 p.m. Monday - Friday. Please note that voicemails left after 4:00 p.m. may not be returned until the following business day.  We are closed weekends and major holidays. You have access to a nurse at all times for urgent questions. Please call the main number to the clinic Dept: [phone] and follow the prompts.   For any non-urgent questions, you may also contact your provider using MyChart. We now offer e-Visits for anyone 36 and older to request care online for non-urgent symptoms. For details visit [email or web address].   Also download the MyChart app! Go to the app store, search "MyChart", open the app, select [facility], and log in with your MyChart username and password.  Due to Covid, a mask is required upon entering the hospital/clinic. If you do not have a mask, one will be given to you upon arrival. For doctor visits, patients may have 1 support person aged 63 or older with them. For treatment visits, patients cannot have anyone with them due to current Covid guidelines and our immunocompromised population.

## 2021-02-27 ENCOUNTER — Inpatient Hospital Stay: Payer: 59

## 2021-02-27 ENCOUNTER — Inpatient Hospital Stay (HOSPITAL_BASED_OUTPATIENT_CLINIC_OR_DEPARTMENT_OTHER): Payer: 59 | Admitting: Hematology and Oncology

## 2021-02-27 ENCOUNTER — Other Ambulatory Visit: Payer: Self-pay

## 2021-02-27 VITALS — BP 150/72 | HR 99 | Temp 98.0°F | Resp 17 | Wt 185.1 lb

## 2021-02-27 DIAGNOSIS — C9030 Solitary plasmacytoma not having achieved remission: Secondary | ICD-10-CM

## 2021-02-27 DIAGNOSIS — C9000 Multiple myeloma not having achieved remission: Secondary | ICD-10-CM

## 2021-02-27 DIAGNOSIS — Z5111 Encounter for antineoplastic chemotherapy: Secondary | ICD-10-CM | POA: Diagnosis not present

## 2021-02-27 LAB — CBC WITH DIFFERENTIAL (CANCER CENTER ONLY)
Abs Immature Granulocytes: 0.02 10*3/uL (ref 0.00–0.07)
Basophils Absolute: 0 10*3/uL (ref 0.0–0.1)
Basophils Relative: 1 %
Eosinophils Absolute: 0 10*3/uL (ref 0.0–0.5)
Eosinophils Relative: 1 %
HCT: 26.5 % — ABNORMAL LOW (ref 36.0–46.0)
Hemoglobin: 9 g/dL — ABNORMAL LOW (ref 12.0–15.0)
Immature Granulocytes: 1 %
Lymphocytes Relative: 6 %
Lymphs Abs: 0.2 10*3/uL — ABNORMAL LOW (ref 0.7–4.0)
MCH: 29.8 pg (ref 26.0–34.0)
MCHC: 34 g/dL (ref 30.0–36.0)
MCV: 87.7 fL (ref 80.0–100.0)
Monocytes Absolute: 0.1 10*3/uL (ref 0.1–1.0)
Monocytes Relative: 2 %
Neutro Abs: 2.4 10*3/uL (ref 1.7–7.7)
Neutrophils Relative %: 89 %
Platelet Count: 185 10*3/uL (ref 150–400)
RBC: 3.02 MIL/uL — ABNORMAL LOW (ref 3.87–5.11)
RDW: 15.6 % — ABNORMAL HIGH (ref 11.5–15.5)
WBC Count: 2.6 10*3/uL — ABNORMAL LOW (ref 4.0–10.5)
nRBC: 0 % (ref 0.0–0.2)

## 2021-02-27 LAB — CMP (CANCER CENTER ONLY)
ALT: 18 U/L (ref 0–44)
AST: 18 U/L (ref 15–41)
Albumin: 4.6 g/dL (ref 3.5–5.0)
Alkaline Phosphatase: 61 U/L (ref 38–126)
Anion gap: 11 (ref 5–15)
BUN: 34 mg/dL — ABNORMAL HIGH (ref 8–23)
CO2: 20 mmol/L — ABNORMAL LOW (ref 22–32)
Calcium: 9 mg/dL (ref 8.9–10.3)
Chloride: 107 mmol/L (ref 98–111)
Creatinine: 2.14 mg/dL — ABNORMAL HIGH (ref 0.44–1.00)
GFR, Estimated: 25 mL/min — ABNORMAL LOW (ref >60)
Glucose, Bld: 190 mg/dL — ABNORMAL HIGH (ref 70–99)
Potassium: 4 mmol/L (ref 3.5–5.1)
Sodium: 138 mmol/L (ref 135–145)
Total Bilirubin: 0.4 mg/dL (ref 0.3–1.2)
Total Protein: 7.2 g/dL (ref 6.5–8.1)

## 2021-02-27 LAB — LACTATE DEHYDROGENASE: LDH: 183 U/L (ref 98–192)

## 2021-02-27 MED ORDER — PALONOSETRON HCL INJECTION 0.25 MG/5ML
0.2500 mg | Freq: Once | INTRAVENOUS | Status: AC
  Administered 2021-02-27: 0.25 mg via INTRAVENOUS
  Filled 2021-02-27: qty 5

## 2021-02-27 MED ORDER — BORTEZOMIB CHEMO SQ INJECTION 3.5 MG (2.5MG/ML)
1.5000 mg/m2 | Freq: Once | INTRAMUSCULAR | Status: AC
  Administered 2021-02-27: 3 mg via SUBCUTANEOUS
  Filled 2021-02-27: qty 1.2

## 2021-02-27 MED ORDER — SODIUM CHLORIDE 0.9 % IV SOLN: Freq: Once | INTRAVENOUS | Status: AC

## 2021-02-27 MED ORDER — SODIUM CHLORIDE 0.9 % IV SOLN
300.0000 mg/m2 | Freq: Once | INTRAVENOUS | Status: AC
  Administered 2021-02-27: 600 mg via INTRAVENOUS
  Filled 2021-02-27: qty 30

## 2021-02-27 NOTE — Patient Instructions (Signed)
[facility]  Discharge Instructions: Thank you for choosing [facility] to provide your oncology and hematology care.   If you have a lab appointment with the [facility], please go directly to the [facility] and check in at the registration area.   Wear comfortable clothing and clothing appropriate for easy access to any Portacath or PICC line.   We strive to give you quality time with your provider. You may need to reschedule your appointment if you arrive late (15 or more minutes).  Arriving late affects you and other patients whose appointments are after yours.  Also, if you miss three or more appointments without notifying the office, you may be dismissed from the clinic at the providers discretion.      For prescription refill requests, have your pharmacy contact our office and allow 72 hours for refills to be completed.    Today you received the following chemotherapy and/or immunotherapy agents: Cytoxan & Velcade    To help prevent nausea and vomiting after your treatment, we encourage you to take your nausea medication as directed.  BELOW ARE SYMPTOMS THAT SHOULD BE REPORTED IMMEDIATELY: *FEVER GREATER THAN 100.4 F (38 C) OR HIGHER *CHILLS OR SWEATING *NAUSEA AND VOMITING THAT IS NOT CONTROLLED WITH YOUR NAUSEA MEDICATION *UNUSUAL SHORTNESS OF BREATH *UNUSUAL BRUISING OR BLEEDING *URINARY PROBLEMS (pain or burning when urinating, or frequent urination) *BOWEL PROBLEMS (unusual diarrhea, constipation, pain near the anus) TENDERNESS IN MOUTH AND THROAT WITH OR WITHOUT PRESENCE OF ULCERS (sore throat, sores in mouth, or a toothache) UNUSUAL RASH, SWELLING OR PAIN  UNUSUAL VAGINAL DISCHARGE OR ITCHING   Items with * indicate a potential emergency and should be followed up as soon as possible or go to the Emergency Department if any problems should occur.  Please show the CHEMOTHERAPY ALERT CARD or IMMUNOTHERAPY ALERT CARD at  check-in to the Emergency Department and triage nurse.  Should you have questions after your visit or need to cancel or reschedule your appointment, please contact [facility]  Dept: [phone]  and follow the prompts.  Office hours are 8:00 a.m. to 4:30 p.m. Monday - Friday. Please note that voicemails left after 4:00 p.m. may not be returned until the following business day.  We are closed weekends and major holidays. You have access to a nurse at all times for urgent questions. Please call the main number to the clinic Dept: [phone] and follow the prompts.   For any non-urgent questions, you may also contact your provider using MyChart. We now offer e-Visits for anyone 18 and older to request care online for non-urgent symptoms. For details visit [email or web address].   Also download the MyChart app! Go to the app store, search "MyChart", open the app, select [facility], and log in with your MyChart username and password.  Due to Covid, a mask is required upon entering the hospital/clinic. If you do not have a mask, one will be given to you upon arrival. For doctor visits, patients may have 1 support person aged 29 or older with them. For treatment visits, patients cannot have anyone with them due to current Covid guidelines and our immunocompromised population.

## 2021-02-27 NOTE — Progress Notes (Signed)
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  Serological testing concerning for a plasma cell neoplasm, with UPEP showing 7.4 g of protein daily with markedly high lambda light chains -11/07/2020: Bone marrow biopsy performed, shows no evidence of multiple myeloma -11/23/2020: Biopsy of right clavicle mass confirmed plasmacytoma.  -11/27/2020: PET scan show intensely hypermetabolic large soft tissue masses arising from the LEFT and RIGHT ribs. Two large lesions in the LEFT chest wall and a single large lesion in the RIGHT chest wall.Hypermetabolic expansile solid masses arising from the medial RIGHT clavicle and RIGHT inferior pubic ischium 01/09/2021: Cycle 1, Day 1 CyBorD 02/06/2021: Cycle 2, Day 1 CyBorD 03/06/2021: anticipated start of Cycle 3, Day 1 CyBorD  Interval History:  Jordan Gregory 66 y.o. 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 The patient was last seen on 02/06/2021. In the interim since the last visit she has continued Cycle 2 of CyBorD.   On exam today Jordan Gregory reports she has been feeling okay in the interim since her last visit.  She notes that she is not having any nausea, vomiting, or diarrhea but has been struggling with constipation.  She does report on days where she received dexamethasone therapy she "stays up all night".  She notes that she has been having some trouble with  fatigue and has to take frequent breaks while walking her work.  She notes that she continues to have some lower back pain and some occasional episodes of itching.  She notes that she has been having some episodes of dry mouth.  Patient denies fevers, chills, night sweats, chest pain or cough.  She has no other complaints.  Rest of the 10 point ROS is below.  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[facility])    Thyroid disease    Vitamin D deficiency     SURGICAL HISTORY: Past Surgical History:  Procedure Laterality Date   MOHS SURGERY  2016    SOCIAL HISTORY: Social History   Socioeconomic History   Marital status: Married    Spouse name: Not on file   Number of children: Not on file   Years of education: Not on file   Highest education level: Not on file  Occupational History   Not on file  Tobacco Use   Smoking status: Never   Smokeless tobacco: Never  Vaping Use   Vaping Use: Never used  Substance and Sexual Activity   Alcohol use: Yes    Alcohol/week: 5.0 standard drinks    Types: 5 Standard drinks or equivalent per week    Comment: regular   Drug use: No   Sexual activity: Yes    Birth control/protection: Post-menopausal    Comment: 1st intercourse 82 yo-5 partners  Other Topics Concern   Not on file  Social History Narrative   She works at [employer].    Married - 23 years    No kids  She likes to travel, read, cycle, be outside.    Social Determinants of Health   Financial Resource Strain: Not on file  Food Insecurity: Not on file  Transportation Needs: No Transportation Needs   Lack of Transportation (Medical): No   Lack of Transportation (Non-Medical): No  Physical Activity: Not on file  Stress: Not on file  Social Connections: Not on file  Intimate Partner Violence: Not At Risk   Fear of Current or Ex-Partner: No   Emotionally Abused: No   Physically Abused: No   Sexually  Abused: No    FAMILY HISTORY: Family History  Problem Relation Age of Onset   Arthritis Mother    Hypertension Mother    Dementia Mother    Heart disease Father    Liver cancer Father    Asperger's syndrome Brother    Atrial fibrillation Brother     ALLERGIES:  is allergic to ramipril.  MEDICATIONS:  Current Outpatient Medications  Medication Sig Dispense Refill   acyclovir (ZOVIRAX) 400 MG tablet Take 1 tablet (400 mg total) by mouth 2 (two) times daily. 60 tablet 2   allopurinol (ZYLOPRIM) 300 MG tablet Take 1 tablet (300 mg total) by mouth daily. 30 tablet 3   amLODipine (NORVASC) 10 MG tablet Take 10 mg by mouth daily.     calcium carbonate (OS-CAL) 600 MG TABS Take 600 mg by mouth daily.     citalopram (CELEXA) 10 MG tablet Take 1 tablet (10 mg total) by mouth daily. 90 tablet 0   Dexamethasone 20 MG TABS Take 40 mg by mouth once a week. Once a week. 60 tablet 3   levothyroxine (SYNTHROID) 75 MCG tablet TAKE 1 TABLET BY MOUTH DAILY BEFORE BREAKFAST. 90 tablet 3   ondansetron (ZOFRAN) 8 MG tablet Take 1 tablet (8 mg total) by mouth every 8 (eight) hours as needed for nausea or vomiting. 60 tablet 2   prochlorperazine (COMPAZINE) 10 MG tablet Take 1 tablet (10 mg total) by mouth every 6 (six) hours as needed for nausea or vomiting. (Patient not taking: Reported on 02/06/2021) 60 tablet 2   traZODone (DESYREL) 50 MG tablet TAKE 0.5-1 TABLETS BY MOUTH AT BEDTIME AS NEEDED FOR SLEEP. 90 tablet 1   VITAMIN D, CHOLECALCIFEROL, PO Take 2,000 Int'l Units by mouth daily.     No current facility-administered medications for this visit.    REVIEW OF SYSTEMS:   Constitutional: ( - ) fevers, ( - )  chills , ( - ) night sweats Eyes: ( - ) blurriness of vision, ( - ) double vision, ( - ) watery eyes Ears, nose, mouth, throat, and face: ( - ) mucositis, ( - ) sore throat Respiratory: ( - ) cough, ( - ) dyspnea, ( - ) wheezes Cardiovascular: ( - ) palpitation, ( - ) chest discomfort, ( - )  lower extremity swelling Gastrointestinal:  ( - ) nausea, ( - ) heartburn, ( - ) change in bowel habits Skin: ( - ) abnormal skin rashes Lymphatics: ( - ) new lymphadenopathy, ( - ) easy bruising Neurological: ( - ) numbness, ( - ) tingling, ( - ) new weaknesses Behavioral/Psych: ( - ) mood change, ( - ) new changes  All other systems were reviewed with the patient and are negative.  PHYSICAL EXAMINATION: ECOG PERFORMANCE STATUS: 1 - Symptomatic but completely ambulatory  Vitals:   02/27/21 1410  BP: (!) 150/72  Pulse: 99  Resp: 17  Temp: 98 F (36.7 C)  SpO2: 100%  Filed Weights   02/27/21 1410  Weight: 185 lb 1 oz (83.9 kg)    GENERAL: Well-appearing middle-age Caucasian female, alert, no distress and comfortable SKIN: skin color, texture, turgor are normal, no rashes or significant lesions EYES: conjunctiva are pink and non-injected, sclera clear CHEST: mass over right clavicle, firm LUNGS: clear to auscultation and percussion with normal breathing effort HEART: regular rate & rhythm and no murmurs and no lower extremity edema Musculoskeletal: no cyanosis of digits and no clubbing  PSYCH: alert & oriented x 3, fluent speech NEURO: no focal motor/sensory deficits  LABORATORY DATA:  I have reviewed the data as listed CBC Latest Ref Rng & Units 02/27/2021 02/20/2021 02/13/2021  WBC 4.0 - 10.5 K/uL 2.6(L) 3.1(L) 2.6(L)  Hemoglobin 12.0 - 15.0 g/dL 9.0(L) 9.3(L) 9.5(L)  Hematocrit 36.0 - 46.0 % 26.5(L) 27.4(L) 27.4(L)  Platelets 150 - 400 K/uL 185 168 160    CMP Latest Ref Rng & Units 02/27/2021 02/20/2021 02/13/2021  Glucose 70 - 99 mg/dL 190(H) 148(H) 116(H)  BUN 8 - 23 mg/dL 34(H) 27(H) 34(H)  Creatinine 0.44 - 1.00 mg/dL 2.14(H) 2.10(H) 2.38(H)  Sodium 135 - 145 mmol/L 138 138 138  Potassium 3.5 - 5.1 mmol/L 4.0 4.1 4.2  Chloride 98 - 111 mmol/L 107 106 104  CO2 22 - 32 mmol/L 20(L) 22 24  Calcium 8.9 - 10.3 mg/dL 9.0 9.4 9.5  Total Protein 6.5 - 8.1 g/dL 7.2 7.1 7.5   Total Bilirubin 0.3 - 1.2 mg/dL 0.4 0.4 0.4  Alkaline Phos 38 - 126 U/L 61 61 58  AST 15 - 41 U/L 18 16 18   ALT 0 - 44 U/L 18 15 17     Lab Results  Component Value Date   MPROTEIN 0.1 (H) 01/30/2021   MPROTEIN 0.3 (H) 01/09/2021   MPROTEIN 0.4 (H) 10/23/2020   Lab Results  Component Value Date   KPAFRELGTCHN 15.9 02/27/2021   KPAFRELGTCHN 24.5 (H) 01/30/2021   KPAFRELGTCHN 36.3 (H) 01/09/2021   LAMBDASER 96.4 (H) 02/27/2021   LAMBDASER 1,995.1 (H) 01/30/2021   LAMBDASER 5,514.1 (H) 01/09/2021   KAPLAMBRATIO 0.16 (L) 02/27/2021   KAPLAMBRATIO 0.01 (L) 01/30/2021   KAPLAMBRATIO 0.01 (L) 01/09/2021     RADIOGRAPHIC STUDIES: No results found.  [place] 66 y.o. 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  Previously we discussed the diagnosis and treatment options for plasmacytomas without multiple myeloma. This includes radiation therapy verus chemotherapy. Dr. Lorenso Courier discussed case with Dr. Maylene Roes from [facility] and the recommendation was to proceed with chemotherapy due to patients decline in renal function. Patient is scheduled for a consultation with Dr. Maylene Roes on 01/11/2021.   Given the patient's kidney dysfunction at this time the treatment of choice is CyBorD chemotherapy.  We talked about the expected side effects of this regimen and the scheduling.  The patient voiced her understanding of this plan moving forward.   The CyBorD regimen consists of bortezomib 1.5 mg/m2 on days 1, 8, 15, and 22.  Cyclophosphamide 300 mg/m2 IV is administered on days 1, 8, 15, and 22.  Additionally the patient was taking p.o. dexamethasone 40 mg on days 1, 8, 15, and 22.  This is to be continued until the patient reaches a VGPR at which time we could consider transition to bortezomib maintenance.  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   --Serological studies are  confirmatory of a plasma cell neoplasm with markedly high lambda light chains and proteinuria.  --Bone marrow biopsy from 11/07/2020 showed no evidence of multiple myeloma. Right clavicle biopsy from 11/23/2020 confirmed plasma cell neoplasm consistent with plasmacytomas.  --PET scan from 11/27/2020 showed multifocal hypermetabolic soft tissue massing arising from the left and right ribs, left and right chest wall, right clavicle and right inferior pubic ischium. Largest is solid mass in the anterior left chest wall measuring 10.2 x 9.9 cm.  --Recommendation is to initiate systemic chemotherapy due to acute renal dysfunction.  --Treatment of choice is CyBorD which started 01/10/2020.  Plan: --Labs today were reviewed and adequate for treatment today. Patient will proceed with Cycle 2, Day 22 of CyBorD today --Weekly labs to check CBC, CMP and LDH. Check myeloma labs and UPEP monthly.  --RTC in 2 weeks Cycle 3, Day 8 with continued weekly treatment  #Neutropenia/Anemia: --Secondary to underlying malignancy. --Hgb 9.0 and ANC 2.4 today, stable  --Continue to monitor.   #Renal dysfunction: --Secondary to underlying malignancy.  --Creatinine is 2.14, improving --Continue to monitor.   #Supportive Care -- port placement not required -- zofran 36m q8H PRN and compazine 147mPO q6H for nausea -- acyclovir 40064mO BID for VCZ prophylaxis -- no pain medication required at this time.   No orders of the defined types were placed in this encounter.   All questions were answered. The patient knows to call the clinic with any problems, questions or concerns.  I have spent a total of 30 minutes minutes of face-to-face and non-face-to-face time, preparing to see the patient, performing a medically appropriate examination, counseling and educating the patient, ordering medications, documenting clinical information in the electronic health record,   and care coordination.    JohLedell PeoplesD Department of Hematology/Oncology [facility]one: [phone]: [phone]: [email or web address]

## 2021-02-28 LAB — KAPPA/LAMBDA LIGHT CHAINS
Kappa free light chain: 15.9 mg/L (ref 3.3–19.4)
Kappa, lambda light chain ratio: 0.16 — ABNORMAL LOW (ref 0.26–1.65)
Lambda free light chains: 96.4 mg/L — ABNORMAL HIGH (ref 5.7–26.3)

## 2021-03-05 ENCOUNTER — Encounter: Payer: Self-pay | Admitting: Hematology and Oncology

## 2021-03-05 LAB — MULTIPLE MYELOMA PANEL, SERUM
Albumin SerPl Elph-Mcnc: 4 g/dL (ref 2.9–4.4)
Albumin/Glob SerPl: 1.5 (ref 0.7–1.7)
Alpha 1: 0.3 g/dL (ref 0.0–0.4)
Alpha2 Glob SerPl Elph-Mcnc: 0.7 g/dL (ref 0.4–1.0)
B-Globulin SerPl Elph-Mcnc: 0.9 g/dL (ref 0.7–1.3)
Gamma Glob SerPl Elph-Mcnc: 0.9 g/dL (ref 0.4–1.8)
Globulin, Total: 2.8 g/dL (ref 2.2–3.9)
IgA: 28 mg/dL — ABNORMAL LOW (ref 87–352)
IgG (Immunoglobin G), Serum: 921 mg/dL (ref 586–1602)
IgM (Immunoglobulin M), Srm: 21 mg/dL — ABNORMAL LOW (ref 26–217)
Total Protein ELP: 6.8 g/dL (ref 6.0–8.5)

## 2021-03-06 ENCOUNTER — Other Ambulatory Visit: Payer: Self-pay | Admitting: Hematology and Oncology

## 2021-03-06 ENCOUNTER — Inpatient Hospital Stay: Payer: 59 | Attending: Physician Assistant

## 2021-03-06 ENCOUNTER — Other Ambulatory Visit: Payer: Self-pay

## 2021-03-06 ENCOUNTER — Inpatient Hospital Stay: Payer: 59

## 2021-03-06 VITALS — BP 136/75 | HR 99 | Temp 97.8°F | Resp 18 | Wt 183.8 lb

## 2021-03-06 DIAGNOSIS — C9000 Multiple myeloma not having achieved remission: Secondary | ICD-10-CM | POA: Diagnosis present

## 2021-03-06 DIAGNOSIS — Z5111 Encounter for antineoplastic chemotherapy: Secondary | ICD-10-CM | POA: Insufficient documentation

## 2021-03-06 DIAGNOSIS — Z79899 Other long term (current) drug therapy: Secondary | ICD-10-CM | POA: Diagnosis not present

## 2021-03-06 DIAGNOSIS — Z5112 Encounter for antineoplastic immunotherapy: Secondary | ICD-10-CM | POA: Insufficient documentation

## 2021-03-06 LAB — CBC WITH DIFFERENTIAL (CANCER CENTER ONLY)
Abs Immature Granulocytes: 0.02 10*3/uL (ref 0.00–0.07)
Basophils Absolute: 0 10*3/uL (ref 0.0–0.1)
Basophils Relative: 1 %
Eosinophils Absolute: 0 10*3/uL (ref 0.0–0.5)
Eosinophils Relative: 1 %
HCT: 28.1 % — ABNORMAL LOW (ref 36.0–46.0)
Hemoglobin: 9.4 g/dL — ABNORMAL LOW (ref 12.0–15.0)
Immature Granulocytes: 1 %
Lymphocytes Relative: 6 %
Lymphs Abs: 0.2 10*3/uL — ABNORMAL LOW (ref 0.7–4.0)
MCH: 29.7 pg (ref 26.0–34.0)
MCHC: 33.5 g/dL (ref 30.0–36.0)
MCV: 88.6 fL (ref 80.0–100.0)
Monocytes Absolute: 0.1 10*3/uL (ref 0.1–1.0)
Monocytes Relative: 2 %
Neutro Abs: 2.6 10*3/uL (ref 1.7–7.7)
Neutrophils Relative %: 89 %
Platelet Count: 179 10*3/uL (ref 150–400)
RBC: 3.17 MIL/uL — ABNORMAL LOW (ref 3.87–5.11)
RDW: 16.5 % — ABNORMAL HIGH (ref 11.5–15.5)
WBC Count: 2.9 10*3/uL — ABNORMAL LOW (ref 4.0–10.5)
nRBC: 0 % (ref 0.0–0.2)

## 2021-03-06 LAB — CMP (CANCER CENTER ONLY)
ALT: 19 U/L (ref 0–44)
AST: 19 U/L (ref 15–41)
Albumin: 4.7 g/dL (ref 3.5–5.0)
Alkaline Phosphatase: 68 U/L (ref 38–126)
Anion gap: 11 (ref 5–15)
BUN: 30 mg/dL — ABNORMAL HIGH (ref 8–23)
CO2: 21 mmol/L — ABNORMAL LOW (ref 22–32)
Calcium: 9.6 mg/dL (ref 8.9–10.3)
Chloride: 106 mmol/L (ref 98–111)
Creatinine: 2.09 mg/dL — ABNORMAL HIGH (ref 0.44–1.00)
GFR, Estimated: 26 mL/min — ABNORMAL LOW (ref >60)
Glucose, Bld: 176 mg/dL — ABNORMAL HIGH (ref 70–99)
Potassium: 3.8 mmol/L (ref 3.5–5.1)
Sodium: 138 mmol/L (ref 135–145)
Total Bilirubin: 0.5 mg/dL (ref 0.3–1.2)
Total Protein: 7.4 g/dL (ref 6.5–8.1)

## 2021-03-06 LAB — LACTATE DEHYDROGENASE: LDH: 194 U/L — ABNORMAL HIGH (ref 98–192)

## 2021-03-06 MED ORDER — PALONOSETRON HCL INJECTION 0.25 MG/5ML
0.2500 mg | Freq: Once | INTRAVENOUS | Status: AC
  Administered 2021-03-06: 0.25 mg via INTRAVENOUS
  Filled 2021-03-06: qty 5

## 2021-03-06 MED ORDER — BORTEZOMIB CHEMO SQ INJECTION 3.5 MG (2.5MG/ML)
1.5000 mg/m2 | Freq: Once | INTRAMUSCULAR | Status: AC
  Administered 2021-03-06: 3 mg via SUBCUTANEOUS
  Filled 2021-03-06: qty 1.2

## 2021-03-06 MED ORDER — SODIUM CHLORIDE 0.9 % IV SOLN
300.0000 mg/m2 | Freq: Once | INTRAVENOUS | Status: AC
  Administered 2021-03-06: 600 mg via INTRAVENOUS
  Filled 2021-03-06: qty 30

## 2021-03-06 MED ORDER — SODIUM CHLORIDE 0.9 % IV SOLN: Freq: Once | INTRAVENOUS | Status: AC

## 2021-03-06 NOTE — Progress Notes (Signed)
Per Lorenso Courier MD, ok to treat with SCR 2.09.  ?

## 2021-03-06 NOTE — Patient Instructions (Signed)
[facility]  Discharge Instructions: ?Thank you for choosing [facility] to provide your oncology and hematology care.  ? ?If you have a lab appointment with the [facility], please go directly to the [facility] and check in at the registration area. ?  ?Wear comfortable clothing and clothing appropriate for easy access to any Portacath or PICC line.  ? ?We strive to give you quality time with your provider. You may need to reschedule your appointment if you arrive late (15 or more minutes).  Arriving late affects you and other patients whose appointments are after yours.  Also, if you miss three or more appointments without notifying the office, you may be dismissed from the clinic at the provider?s discretion.    ?  ?For prescription refill requests, have your pharmacy contact our office and allow 72 hours for refills to be completed.   ? ?Today you received the following chemotherapy and/or immunotherapy agents: Velcade, Cytoxan.     ?  ?To help prevent nausea and vomiting after your treatment, we encourage you to take your nausea medication as directed. ? ?BELOW ARE SYMPTOMS THAT SHOULD BE REPORTED IMMEDIATELY: ?*FEVER GREATER THAN 100.4 F (38 ?C) OR HIGHER ?*CHILLS OR SWEATING ?*NAUSEA AND VOMITING THAT IS NOT CONTROLLED WITH YOUR NAUSEA MEDICATION ?*UNUSUAL SHORTNESS OF BREATH ?*UNUSUAL BRUISING OR BLEEDING ?*URINARY PROBLEMS (pain or burning when urinating, or frequent urination) ?*BOWEL PROBLEMS (unusual diarrhea, constipation, pain near the anus) ?TENDERNESS IN MOUTH AND THROAT WITH OR WITHOUT PRESENCE OF ULCERS (sore throat, sores in mouth, or a toothache) ?UNUSUAL RASH, SWELLING OR PAIN  ?UNUSUAL VAGINAL DISCHARGE OR ITCHING  ? ?Items with * indicate a potential emergency and should be followed up as soon as possible or go to the Emergency Department if any problems should occur. ? ?Please show the CHEMOTHERAPY ALERT CARD or IMMUNOTHERAPY ALERT CARD at  check-in to the Emergency Department and triage nurse. ? ?Should you have questions after your visit or need to cancel or reschedule your appointment, please contact [facility]  Dept: [phone]  and follow the prompts.  Office hours are 8:00 a.m. to 4:30 p.m. Monday - Friday. Please note that voicemails left after 4:00 p.m. may not be returned until the following business day.  We are closed weekends and major holidays. You have access to a nurse at all times for urgent questions. Please call the main number to the clinic Dept: [phone] and follow the prompts. ? ? ?For any non-urgent questions, you may also contact your provider using MyChart. We now offer e-Visits for anyone 82 and older to request care online for non-urgent symptoms. For details visit [email or web address]. ?  ?Also download the MyChart app! Go to the app store, search "MyChart", open the app, select [facility], and log in with your MyChart username and password. ? ?Due to Covid, a mask is required upon entering the hospital/clinic. If you do not have a mask, one will be given to you upon arrival. For doctor visits, patients may have 1 support person aged 45 or older with them. For treatment visits, patients cannot have anyone with them due to current Covid guidelines and our immunocompromised population.  ? ?

## 2021-03-13 ENCOUNTER — Other Ambulatory Visit: Payer: Self-pay

## 2021-03-13 ENCOUNTER — Inpatient Hospital Stay: Payer: 59

## 2021-03-13 ENCOUNTER — Inpatient Hospital Stay (HOSPITAL_BASED_OUTPATIENT_CLINIC_OR_DEPARTMENT_OTHER): Payer: 59 | Admitting: Hematology and Oncology

## 2021-03-13 VITALS — BP 139/73 | HR 96 | Temp 97.5°F | Resp 18 | Wt 185.2 lb

## 2021-03-13 DIAGNOSIS — C9000 Multiple myeloma not having achieved remission: Secondary | ICD-10-CM

## 2021-03-13 DIAGNOSIS — C9030 Solitary plasmacytoma not having achieved remission: Secondary | ICD-10-CM | POA: Diagnosis not present

## 2021-03-13 DIAGNOSIS — Z5111 Encounter for antineoplastic chemotherapy: Secondary | ICD-10-CM | POA: Diagnosis not present

## 2021-03-13 LAB — CBC WITH DIFFERENTIAL (CANCER CENTER ONLY)
Abs Immature Granulocytes: 0.02 10*3/uL (ref 0.00–0.07)
Basophils Absolute: 0 10*3/uL (ref 0.0–0.1)
Basophils Relative: 1 %
Eosinophils Absolute: 0.1 10*3/uL (ref 0.0–0.5)
Eosinophils Relative: 3 %
HCT: 25.9 % — ABNORMAL LOW (ref 36.0–46.0)
Hemoglobin: 9.2 g/dL — ABNORMAL LOW (ref 12.0–15.0)
Immature Granulocytes: 1 %
Lymphocytes Relative: 8 %
Lymphs Abs: 0.2 10*3/uL — ABNORMAL LOW (ref 0.7–4.0)
MCH: 31 pg (ref 26.0–34.0)
MCHC: 35.5 g/dL (ref 30.0–36.0)
MCV: 87.2 fL (ref 80.0–100.0)
Monocytes Absolute: 0.1 10*3/uL (ref 0.1–1.0)
Monocytes Relative: 5 %
Neutro Abs: 2.5 10*3/uL (ref 1.7–7.7)
Neutrophils Relative %: 82 %
Platelet Count: 172 10*3/uL (ref 150–400)
RBC: 2.97 MIL/uL — ABNORMAL LOW (ref 3.87–5.11)
RDW: 16.7 % — ABNORMAL HIGH (ref 11.5–15.5)
WBC Count: 3 10*3/uL — ABNORMAL LOW (ref 4.0–10.5)
nRBC: 0 % (ref 0.0–0.2)

## 2021-03-13 LAB — CMP (CANCER CENTER ONLY)
ALT: 15 U/L (ref 0–44)
AST: 15 U/L (ref 15–41)
Albumin: 4.7 g/dL (ref 3.5–5.0)
Alkaline Phosphatase: 65 U/L (ref 38–126)
Anion gap: 9 (ref 5–15)
BUN: 29 mg/dL — ABNORMAL HIGH (ref 8–23)
CO2: 23 mmol/L (ref 22–32)
Calcium: 9.6 mg/dL (ref 8.9–10.3)
Chloride: 107 mmol/L (ref 98–111)
Creatinine: 1.87 mg/dL — ABNORMAL HIGH (ref 0.44–1.00)
GFR, Estimated: 29 mL/min — ABNORMAL LOW (ref >60)
Glucose, Bld: 126 mg/dL — ABNORMAL HIGH (ref 70–99)
Potassium: 3.9 mmol/L (ref 3.5–5.1)
Sodium: 139 mmol/L (ref 135–145)
Total Bilirubin: 0.5 mg/dL (ref 0.3–1.2)
Total Protein: 7.1 g/dL (ref 6.5–8.1)

## 2021-03-13 LAB — LACTATE DEHYDROGENASE: LDH: 190 U/L (ref 98–192)

## 2021-03-13 MED ORDER — PALONOSETRON HCL INJECTION 0.25 MG/5ML
0.2500 mg | Freq: Once | INTRAVENOUS | Status: AC
  Administered 2021-03-13: 0.25 mg via INTRAVENOUS
  Filled 2021-03-13: qty 5

## 2021-03-13 MED ORDER — BORTEZOMIB CHEMO SQ INJECTION 3.5 MG (2.5MG/ML)
1.5000 mg/m2 | Freq: Once | INTRAMUSCULAR | Status: AC
  Administered 2021-03-13: 3 mg via SUBCUTANEOUS
  Filled 2021-03-13: qty 1.2

## 2021-03-13 MED ORDER — SODIUM CHLORIDE 0.9 % IV SOLN
300.0000 mg/m2 | Freq: Once | INTRAVENOUS | Status: AC
  Administered 2021-03-13: 600 mg via INTRAVENOUS
  Filled 2021-03-13: qty 30

## 2021-03-13 MED ORDER — SODIUM CHLORIDE 0.9 % IV SOLN: Freq: Once | INTRAVENOUS | Status: AC

## 2021-03-13 NOTE — Progress Notes (Signed)
Ok to treat with serum creatinine of 1.'87mg'$ /dL per Dr. Lorenso Courier. ?

## 2021-03-13 NOTE — Progress Notes (Signed)
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  Serological testing concerning for a plasma cell neoplasm, with UPEP showing 7.4 g of protein daily with markedly high lambda light chains -11/07/2020: Bone marrow biopsy performed, shows no evidence of multiple myeloma -11/23/2020: Biopsy of right clavicle mass confirmed plasmacytoma.  -11/27/2020: PET scan show intensely hypermetabolic large soft tissue masses arising from the LEFT and RIGHT ribs. Two large lesions in the LEFT chest wall and a single large lesion in the RIGHT chest wall.Hypermetabolic expansile solid masses arising from the medial RIGHT clavicle and RIGHT inferior pubic ischium 01/09/2021: Cycle 1, Day 1 CyBorD 02/06/2021: Cycle 2, Day 1 CyBorD 03/06/2021: Cycle 3, Day 1 CyBorD  Interval History:  Jordan Gregory 66 y.o. 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 The patient was last seen on 02/27/2021. In the interim since the last visit she has completed Cycle 2 of CyBorD.   On exam today [name] reports continues to experience soreness across her back which feels like a "bruise".  She notes that it does not feel somewhat like muscle pain or bone pain but that it is somewhat tender particular when she tries to sleep at night.  He can cause her discomfort and difficulty getting to sleep.  She notes that there is no discoloration overlying the  site.  She notes that she feels that the plasmacytoma in her chest is shrinking in size.  The other palpable plasmacytoma has all but disappeared.  She notes that she is not having any major side effects as result of the chemotherapy.  She denies any nausea, vomiting, or diarrhea but does have some occasional bouts of constipation.  She does have lower energy levels and generalized fatigue.  She notes that she cannot walk as far or as often as she usually does.  She denies any numbness or tingling in her fingers or toes.  Patient denies fevers, chills, night sweats, chest pain or cough.  She has no other complaints.  Rest of the 10 point ROS is below.  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[place])    Thyroid disease    Vitamin D deficiency     SURGICAL HISTORY: Past Surgical History:  Procedure Laterality Date   MOHS SURGERY  2016    SOCIAL HISTORY: Social History   Socioeconomic History   Marital status: Married    Spouse name: Not on file   Number of children: Not on file   Years of education: Not on file   Highest education level: Not on file  Occupational History   Not on file  Tobacco Use   Smoking status: Never   Smokeless tobacco: Never  Vaping Use   Vaping Use: Never used  Substance and Sexual Activity   Alcohol use: Yes    Alcohol/week: 5.0 standard drinks    Types: 5 Standard drinks or equivalent per week    Comment: regular   Drug use:  No   Sexual activity: Yes    Birth control/protection: Post-menopausal    Comment: 1st intercourse 67 yo-5 partners  Other Topics Concern   Not on file  Social History Narrative   She works at [employer] and [employer].    Married - 23 years    No kids       She likes to travel, read, cycle, be outside.    Social Determinants of Health   Financial Resource Strain: Not on file  Food Insecurity: Not on file  Transportation Needs: No Transportation Needs   Lack of  Transportation (Medical): No   Lack of Transportation (Non-Medical): No  Physical Activity: Not on file  Stress: Not on file  Social Connections: Not on file  Intimate Partner Violence: Not At Risk   Fear of Current or Ex-Partner: No   Emotionally Abused: No   Physically Abused: No   Sexually Abused: No    FAMILY HISTORY: Family History  Problem Relation Age of Onset   Arthritis Mother    Hypertension Mother    Dementia Mother    Heart disease Father    Liver cancer Father    Asperger's syndrome Brother    Atrial fibrillation Brother     ALLERGIES:  is allergic to ramipril.  MEDICATIONS:  Current Outpatient Medications  Medication Sig Dispense Refill   acyclovir (ZOVIRAX) 400 MG tablet Take 1 tablet (400 mg total) by mouth 2 (two) times daily. 60 tablet 2   allopurinol (ZYLOPRIM) 300 MG tablet Take 1 tablet (300 mg total) by mouth daily. 30 tablet 3   amLODipine (NORVASC) 10 MG tablet Take 10 mg by mouth daily.     calcium carbonate (OS-CAL) 600 MG TABS Take 600 mg by mouth daily.     citalopram (CELEXA) 10 MG tablet Take 1 tablet (10 mg total) by mouth daily. 90 tablet 0   Dexamethasone 20 MG TABS Take 40 mg by mouth once a week. Once a week. 60 tablet 3   levothyroxine (SYNTHROID) 75 MCG tablet TAKE 1 TABLET BY MOUTH DAILY BEFORE BREAKFAST. 90 tablet 3   ondansetron (ZOFRAN) 8 MG tablet Take 1 tablet (8 mg total) by mouth every 8 (eight) hours as needed for nausea or vomiting. 60 tablet 2   prochlorperazine (COMPAZINE) 10 MG tablet Take 1 tablet (10 mg total) by mouth every 6 (six) hours as needed for nausea or vomiting. (Patient not taking: Reported on 02/06/2021) 60 tablet 2   traZODone (DESYREL) 50 MG tablet TAKE 0.5-1 TABLETS BY MOUTH AT BEDTIME AS NEEDED FOR SLEEP. 90 tablet 1   VITAMIN D, CHOLECALCIFEROL, PO Take 2,000 Int'l Units by mouth daily.     No current facility-administered medications for this visit.    REVIEW OF SYSTEMS:   Constitutional: ( - ) fevers, (  - )  chills , ( - ) night sweats Eyes: ( - ) blurriness of vision, ( - ) double vision, ( - ) watery eyes Ears, nose, mouth, throat, and face: ( - ) mucositis, ( - ) sore throat Respiratory: ( - ) cough, ( - ) dyspnea, ( - ) wheezes Cardiovascular: ( - ) palpitation, ( - ) chest discomfort, ( - ) lower extremity swelling Gastrointestinal:  ( - ) nausea, ( - ) heartburn, ( - ) change in bowel habits Skin: ( - ) abnormal skin rashes Lymphatics: ( - ) new lymphadenopathy, ( - ) easy bruising Neurological: ( - ) numbness, ( - ) tingling, ( - ) new weaknesses  Behavioral/Psych: ( - ) mood change, ( - ) new changes  All other systems were reviewed with the patient and are negative.  PHYSICAL EXAMINATION: ECOG PERFORMANCE STATUS: 1 - Symptomatic but completely ambulatory  Vitals:   03/13/21 1248  BP: 139/73  Pulse: 96  Resp: 18  Temp: (!) 97.5 F (36.4 C)  SpO2: 100%   Filed Weights   03/13/21 1248  Weight: 185 lb 3 oz (84 kg)    GENERAL: Well-appearing middle-age Caucasian female, alert, no distress and comfortable SKIN: skin color, texture, turgor are normal, no rashes or significant lesions EYES: conjunctiva are pink and non-injected, sclera clear CHEST: mass over right clavicle, firm but decreasing in size LUNGS: clear to auscultation and percussion with normal breathing effort HEART: regular rate & rhythm and no murmurs and no lower extremity edema Musculoskeletal: no cyanosis of digits and no clubbing  PSYCH: alert & oriented x 3, fluent speech NEURO: no focal motor/sensory deficits  LABORATORY DATA:  I have reviewed the data as listed CBC Latest Ref Rng & Units 03/13/2021 03/06/2021 02/27/2021  WBC 4.0 - 10.5 K/uL 3.0(L) 2.9(L) 2.6(L)  Hemoglobin 12.0 - 15.0 g/dL 9.2(L) 9.4(L) 9.0(L)  Hematocrit 36.0 - 46.0 % 25.9(L) 28.1(L) 26.5(L)  Platelets 150 - 400 K/uL 172 179 185    CMP Latest Ref Rng & Units 03/13/2021 03/06/2021 02/27/2021  Glucose 70 - 99 mg/dL 126(H) 176(H) 190(H)   BUN 8 - 23 mg/dL 29(H) 30(H) 34(H)  Creatinine 0.44 - 1.00 mg/dL 1.87(H) 2.09(H) 2.14(H)  Sodium 135 - 145 mmol/L 139 138 138  Potassium 3.5 - 5.1 mmol/L 3.9 3.8 4.0  Chloride 98 - 111 mmol/L 107 106 107  CO2 22 - 32 mmol/L 23 21(L) 20(L)  Calcium 8.9 - 10.3 mg/dL 9.6 9.6 9.0  Total Protein 6.5 - 8.1 g/dL 7.1 7.4 7.2  Total Bilirubin 0.3 - 1.2 mg/dL 0.5 0.5 0.4  Alkaline Phos 38 - 126 U/L 65 68 61  AST 15 - 41 U/L _0 ALT 0 - 44 U/L _1 Lab Results  Component Value Date   MPROTEIN Not Observed 02/27/2021   MPROTEIN 0.1 (H) 01/30/2021   MPROTEIN 0.3 (H) 01/09/2021   Lab Results  Component Value Date   KPAFRELGTCHN 15.9 02/27/2021   KPAFRELGTCHN 24.5 (H) 01/30/2021   KPAFRELGTCHN 36.3 (H) 01/09/2021   LAMBDASER 96.4 (H) 02/27/2021   LAMBDASER 1,995.1 (H) 01/30/2021   LAMBDASER 5,514.1 (H) 01/09/2021   KAPLAMBRATIO 0.16 (L) 02/27/2021   KAPLAMBRATIO 0.01 (L) 01/30/2021   KAPLAMBRATIO 0.01 (L) 01/09/2021     RADIOGRAPHIC STUDIES: No results found.  Jordan Gregory 66 y.o. 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  Previously we discussed the diagnosis and treatment options for plasmacytomas without multiple myeloma. This includes radiation therapy verus chemotherapy. Dr. Lorenso Courier discussed case with Dr. Maylene Roes from [facility] and the recommendation was to proceed with chemotherapy due to patients decline in renal function. Patient is scheduled for a consultation with Dr. Maylene Roes on 01/11/2021.   Given the patient's kidney dysfunction at this time the treatment of choice is CyBorD chemotherapy.  We talked about the expected side effects of this regimen and the scheduling.  The patient voiced her understanding of this plan moving forward.   The CyBorD regimen consists of bortezomib 1.5 mg/m2 on days 1, 8, 15, and 22.  Cyclophosphamide 300 mg/m2 IV is administered on days 1,  8,  15, and 22.  Additionally the patient was taking p.o. dexamethasone 40 mg on days 1, 8, 15, and 22.  This is to be continued until the patient reaches a VGPR at which time we could consider transition to bortezomib maintenance.  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   --Serological studies are confirmatory of a plasma cell neoplasm with markedly high lambda light chains and proteinuria.  --Bone marrow biopsy from 11/07/2020 showed no evidence of multiple myeloma. Right clavicle biopsy from 11/23/2020 confirmed plasma cell neoplasm consistent with plasmacytomas.  --PET scan from 11/27/2020 showed multifocal hypermetabolic soft tissue massing arising from the left and right ribs, left and right chest wall, right clavicle and right inferior pubic ischium. Largest is solid mass in the anterior left chest wall measuring 10.2 x 9.9 cm.  --Recommendation is to initiate systemic chemotherapy due to acute renal dysfunction.  --Treatment of choice is CyBorD which started 01/10/2020.  Plan: --Labs today were reviewed and adequate for treatment today. Patient will proceed with Cycle 3, Day 8 of CyBorD today --Weekly labs to check CBC, CMP and LDH. Check myeloma labs and UPEP monthly.  --RTC in 2 weeks Cycle 3, Day 22 with continued weekly treatment  #Neutropenia/Anemia: --Secondary to underlying malignancy. --Hgb 9.2 and ANC 2.5 today, stable  --Continue to monitor.   #Renal dysfunction: --Secondary to underlying malignancy.  --Creatinine is 1.87 today,  improving --Continue to monitor.   #Supportive Care -- port placement not required -- zofran 47m q8H PRN and compazine 146mPO q6H for nausea -- acyclovir 40083mO BID for VCZ prophylaxis -- no pain medication required at this time.   No orders of the defined types were placed in this encounter.   All questions were answered. The patient knows to call the clinic  with any problems, questions or concerns.  I have spent a total of 30 minutes minutes of face-to-face and non-face-to-face time, preparing to see the patient, performing a medically appropriate examination, counseling and educating the patient, ordering medications, documenting clinical information in the electronic health record,  and care coordination.    JohLedell PeoplesD Department of Hematology/Oncology [place] [facility] LLCone: [phone]: [phone]: [email or web address]

## 2021-03-13 NOTE — Patient Instructions (Signed)
[facility]  Discharge Instructions: ?Thank you for choosing [facility] to provide your oncology and hematology care.  ? ?If you have a lab appointment with the [facility], please go directly to the [facility] and check in at the registration area. ?  ?Wear comfortable clothing and clothing appropriate for easy access to any Portacath or PICC line.  ? ?We strive to give you quality time with your provider. You may need to reschedule your appointment if you arrive late (15 or more minutes).  Arriving late affects you and other patients whose appointments are after yours.  Also, if you miss three or more appointments without notifying the office, you may be dismissed from the clinic at the provider?s discretion.    ?  ?For prescription refill requests, have your pharmacy contact our office and allow 72 hours for refills to be completed.   ? ?Today you received the following chemotherapy and/or immunotherapy agents: Velcade, Cytoxan.     ?  ?To help prevent nausea and vomiting after your treatment, we encourage you to take your nausea medication as directed. ? ?BELOW ARE SYMPTOMS THAT SHOULD BE REPORTED IMMEDIATELY: ?*FEVER GREATER THAN 100.4 F (38 ?C) OR HIGHER ?*CHILLS OR SWEATING ?*NAUSEA AND VOMITING THAT IS NOT CONTROLLED WITH YOUR NAUSEA MEDICATION ?*UNUSUAL SHORTNESS OF BREATH ?*UNUSUAL BRUISING OR BLEEDING ?*URINARY PROBLEMS (pain or burning when urinating, or frequent urination) ?*BOWEL PROBLEMS (unusual diarrhea, constipation, pain near the anus) ?TENDERNESS IN MOUTH AND THROAT WITH OR WITHOUT PRESENCE OF ULCERS (sore throat, sores in mouth, or a toothache) ?UNUSUAL RASH, SWELLING OR PAIN  ?UNUSUAL VAGINAL DISCHARGE OR ITCHING  ? ?Items with * indicate a potential emergency and should be followed up as soon as possible or go to the Emergency Department if any problems should occur. ? ?Please show the CHEMOTHERAPY ALERT CARD or IMMUNOTHERAPY ALERT CARD at  check-in to the Emergency Department and triage nurse. ? ?Should you have questions after your visit or need to cancel or reschedule your appointment, please contact [facility]  Dept: [phone]  and follow the prompts.  Office hours are 8:00 a.m. to 4:30 p.m. Monday - Friday. Please note that voicemails left after 4:00 p.m. may not be returned until the following business day.  We are closed weekends and major holidays. You have access to a nurse at all times for urgent questions. Please call the main number to the clinic Dept: [phone] and follow the prompts. ? ? ?For any non-urgent questions, you may also contact your provider using MyChart. We now offer e-Visits for anyone 23 and older to request care online for non-urgent symptoms. For details visit [email or web address]. ?  ?Also download the MyChart app! Go to the app store, search "MyChart", open the app, select [facility], and log in with your MyChart username and password. ? ?Due to Covid, a mask is required upon entering the hospital/clinic. If you do not have a mask, one will be given to you upon arrival. For doctor visits, patients may have 1 support person aged 54 or older with them. For treatment visits, patients cannot have anyone with them due to current Covid guidelines and our immunocompromised population.  ? ?

## 2021-03-18 ENCOUNTER — Emergency Department (HOSPITAL_BASED_OUTPATIENT_CLINIC_OR_DEPARTMENT_OTHER): Payer: 59

## 2021-03-18 ENCOUNTER — Other Ambulatory Visit: Payer: Self-pay | Admitting: Adult Health

## 2021-03-18 ENCOUNTER — Encounter (HOSPITAL_BASED_OUTPATIENT_CLINIC_OR_DEPARTMENT_OTHER): Payer: Self-pay

## 2021-03-18 ENCOUNTER — Emergency Department (HOSPITAL_BASED_OUTPATIENT_CLINIC_OR_DEPARTMENT_OTHER)
Admission: EM | Admit: 2021-03-18 | Discharge: 2021-03-18 | Disposition: A | Discharge status: Home / Self Care | Payer: 59 | Attending: Emergency Medicine | Admitting: Emergency Medicine

## 2021-03-18 ENCOUNTER — Other Ambulatory Visit: Payer: Self-pay

## 2021-03-18 ENCOUNTER — Telehealth: Payer: Self-pay | Admitting: *Deleted

## 2021-03-18 DIAGNOSIS — Z8579 Personal history of other malignant neoplasms of lymphoid, hematopoietic and related tissues: Secondary | ICD-10-CM | POA: Insufficient documentation

## 2021-03-18 DIAGNOSIS — F32A Depression, unspecified: Secondary | ICD-10-CM

## 2021-03-18 DIAGNOSIS — M79652 Pain in left thigh: Secondary | ICD-10-CM | POA: Diagnosis not present

## 2021-03-18 DIAGNOSIS — F419 Anxiety disorder, unspecified: Secondary | ICD-10-CM

## 2021-03-18 DIAGNOSIS — R109 Unspecified abdominal pain: Secondary | ICD-10-CM | POA: Insufficient documentation

## 2021-03-18 DIAGNOSIS — M545 Low back pain, unspecified: Secondary | ICD-10-CM | POA: Diagnosis present

## 2021-03-18 LAB — COMPREHENSIVE METABOLIC PANEL
ALT: 15 U/L (ref 0–44)
AST: 17 U/L (ref 15–41)
Albumin: 4.6 g/dL (ref 3.5–5.0)
Alkaline Phosphatase: 49 U/L (ref 38–126)
Anion gap: 11 (ref 5–15)
BUN: 23 mg/dL (ref 8–23)
CO2: 24 mmol/L (ref 22–32)
Calcium: 9.7 mg/dL (ref 8.9–10.3)
Chloride: 103 mmol/L (ref 98–111)
Creatinine, Ser: 1.94 mg/dL — ABNORMAL HIGH (ref 0.44–1.00)
GFR, Estimated: 28 mL/min — ABNORMAL LOW (ref >60)
Glucose, Bld: 97 mg/dL (ref 70–99)
Potassium: 3.9 mmol/L (ref 3.5–5.1)
Sodium: 138 mmol/L (ref 135–145)
Total Bilirubin: 0.7 mg/dL (ref 0.3–1.2)
Total Protein: 7.3 g/dL (ref 6.5–8.1)

## 2021-03-18 LAB — LIPASE, BLOOD: Lipase: 12 U/L (ref 11–51)

## 2021-03-18 LAB — CBC WITH DIFFERENTIAL/PLATELET
Abs Immature Granulocytes: 0.02 10*3/uL (ref 0.00–0.07)
Basophils Absolute: 0 10*3/uL (ref 0.0–0.1)
Basophils Relative: 1 %
Eosinophils Absolute: 0.1 10*3/uL (ref 0.0–0.5)
Eosinophils Relative: 3 %
HCT: 27.2 % — ABNORMAL LOW (ref 36.0–46.0)
Hemoglobin: 9.3 g/dL — ABNORMAL LOW (ref 12.0–15.0)
Immature Granulocytes: 1 %
Lymphocytes Relative: 22 %
Lymphs Abs: 0.5 10*3/uL — ABNORMAL LOW (ref 0.7–4.0)
MCH: 30 pg (ref 26.0–34.0)
MCHC: 34.2 g/dL (ref 30.0–36.0)
MCV: 87.7 fL (ref 80.0–100.0)
Monocytes Absolute: 0.3 10*3/uL (ref 0.1–1.0)
Monocytes Relative: 12 %
Neutro Abs: 1.5 10*3/uL — ABNORMAL LOW (ref 1.7–7.7)
Neutrophils Relative %: 61 %
Platelets: 183 10*3/uL (ref 150–400)
RBC: 3.1 MIL/uL — ABNORMAL LOW (ref 3.87–5.11)
RDW: 16.8 % — ABNORMAL HIGH (ref 11.5–15.5)
WBC: 2.4 10*3/uL — ABNORMAL LOW (ref 4.0–10.5)
nRBC: 0 % (ref 0.0–0.2)

## 2021-03-18 IMAGING — CT CT ABD-PELV W/O CM
2 of 4 series · 16 of 46 positions shown, 18 images · non-contrast
Comparison: [DATE]

CLINICAL DATA: Central and RIGHT lower back pain and BILATERAL
lower abdominal pain for a few weeks, history of multiple myeloma on
chemotherapy

EXAM:
CT ABDOMEN AND PELVIS WITHOUT CONTRAST
TECHNIQUE: Multidetector CT imaging of the abdomen and pelvis was performed
following the standard protocol without IV contrast. IV contrast not
administered due to renal dysfunction. No oral contrast.
RADIATION DOSE REDUCTION: This exam was performed according to the
departmental dose-optimization program which includes automated
exposure control, adjustment of the mA and/or kV according to
patient size and/or use of iterative reconstruction technique.

[Series 2: abd pel wo · axial · 0.86mm/px · z∈[+679,+1139]mm · 13 of 102 slices shown, 15 images]
[im 5/102  soft-tissue]
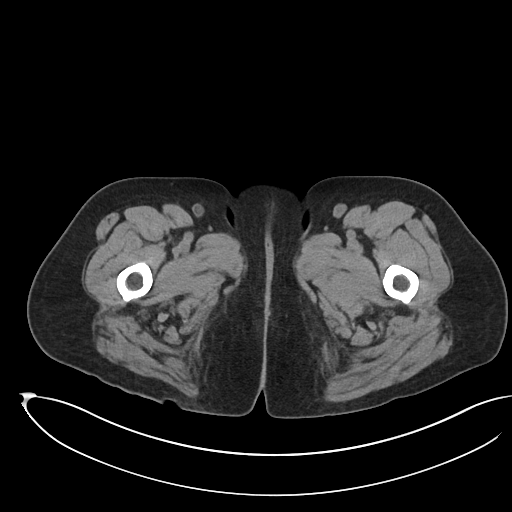
[im 5/102  bone]
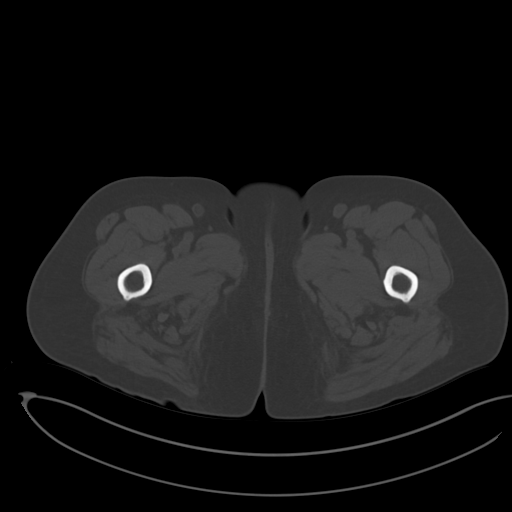
[im 13/102  soft-tissue]
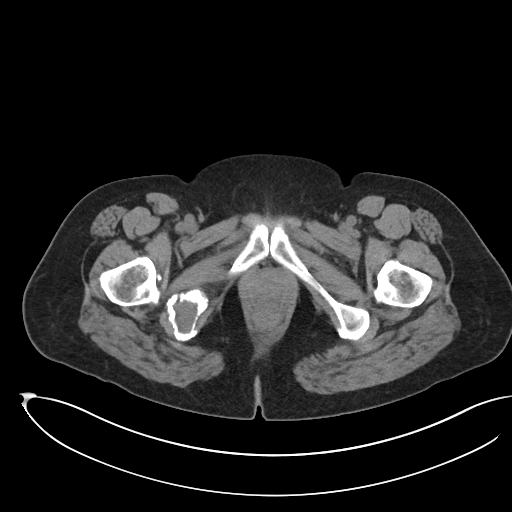
[im 21/102  soft-tissue]
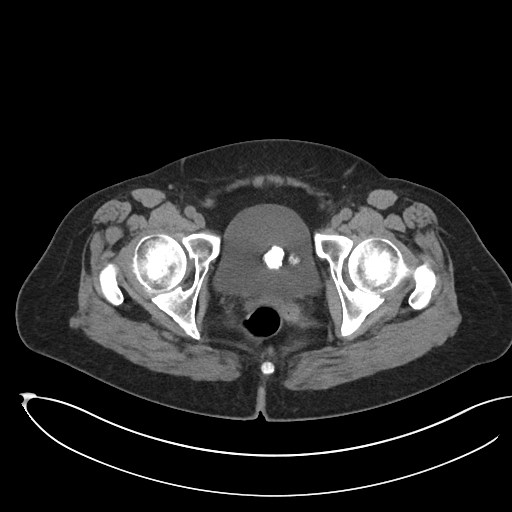
[im 29/102  soft-tissue]
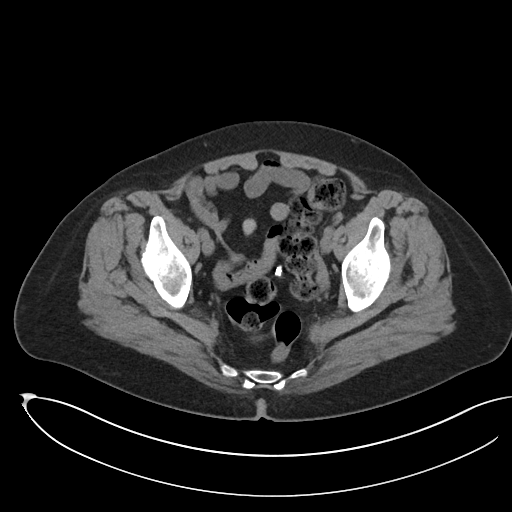
[im 37/102  soft-tissue]
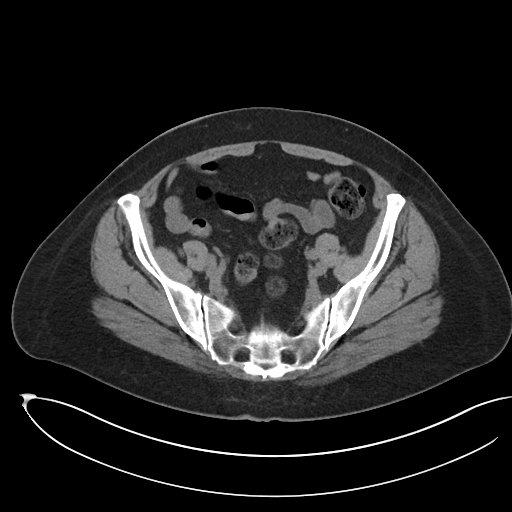
[im 45/102  soft-tissue]
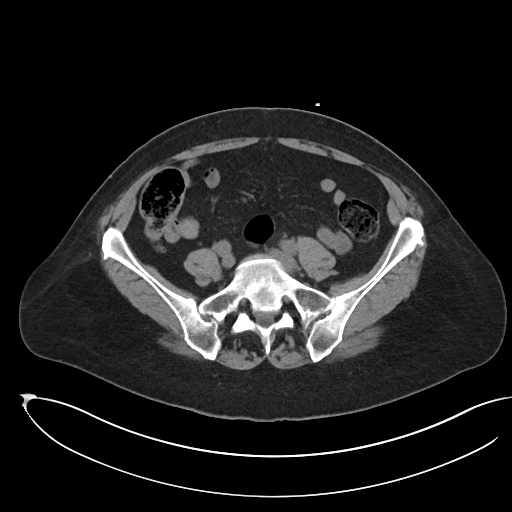
[im 53/102  soft-tissue]
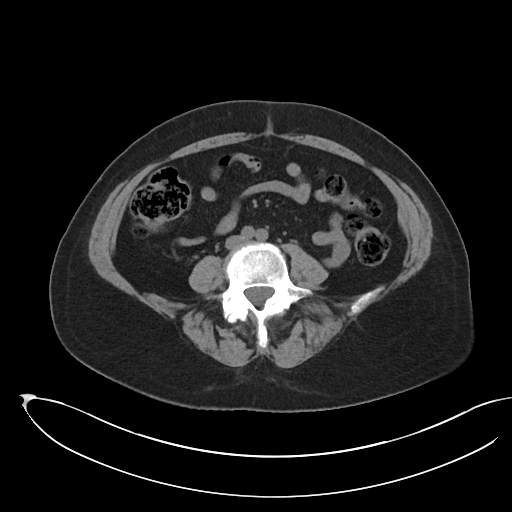
[im 57/102  soft-tissue]
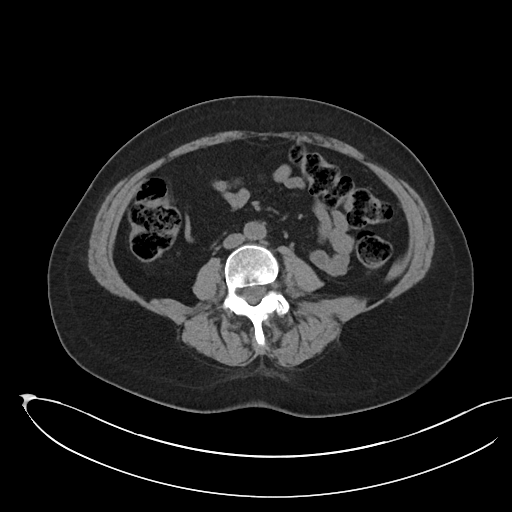
[im 65/102  soft-tissue]
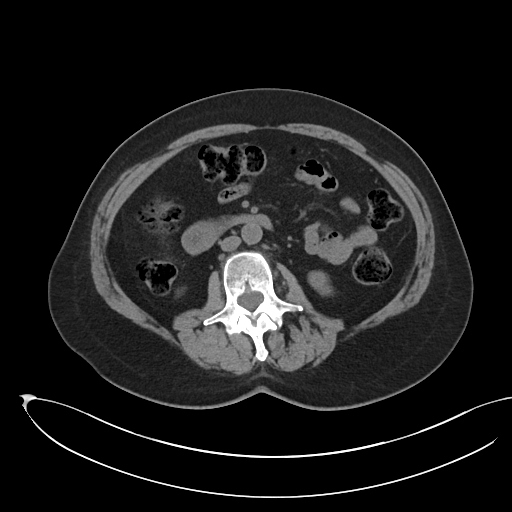
[im 65/102  bone]
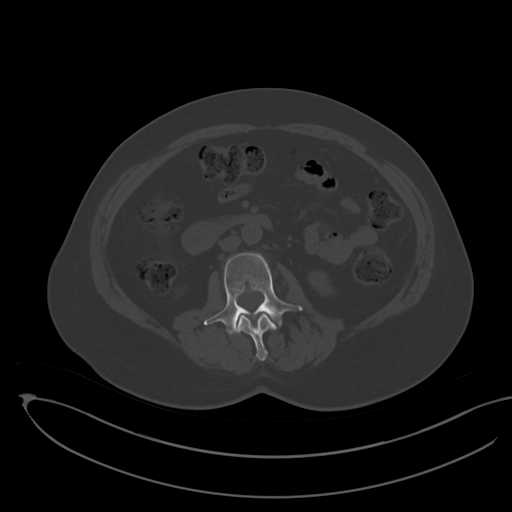
[im 73/102  soft-tissue]
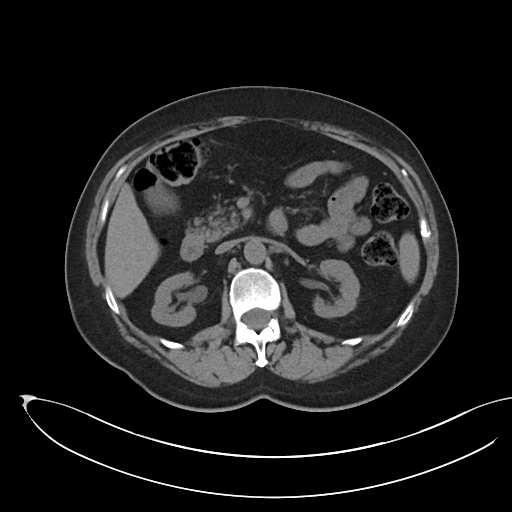
[im 81/102  soft-tissue]
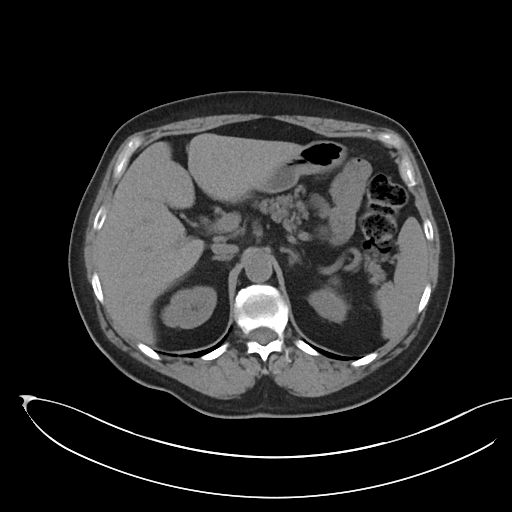
[im 89/102  soft-tissue]
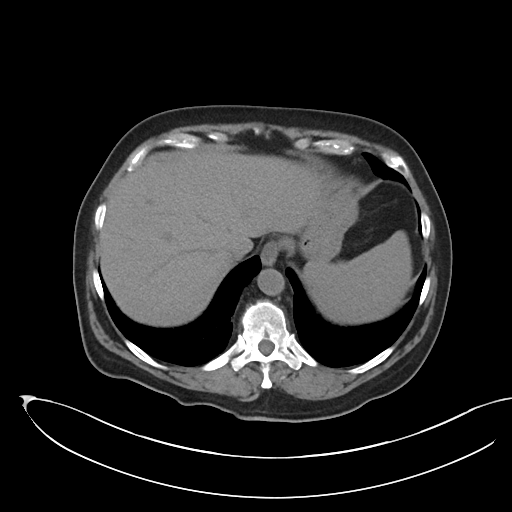
[im 97/102  soft-tissue]
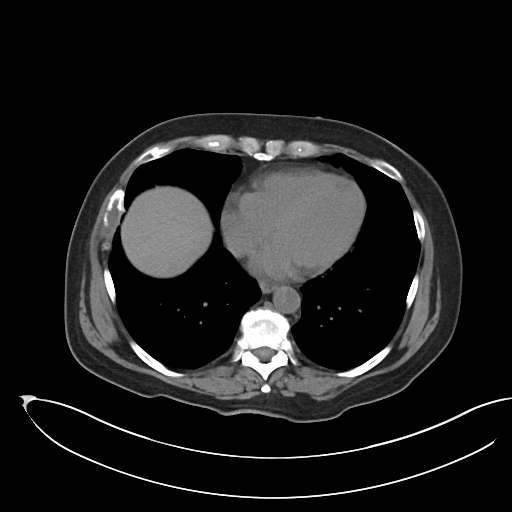

[Series 5: coronal · coronal · 0.85mm/px · 3 of 103 slices shown]
[im 35/103  soft-tissue]
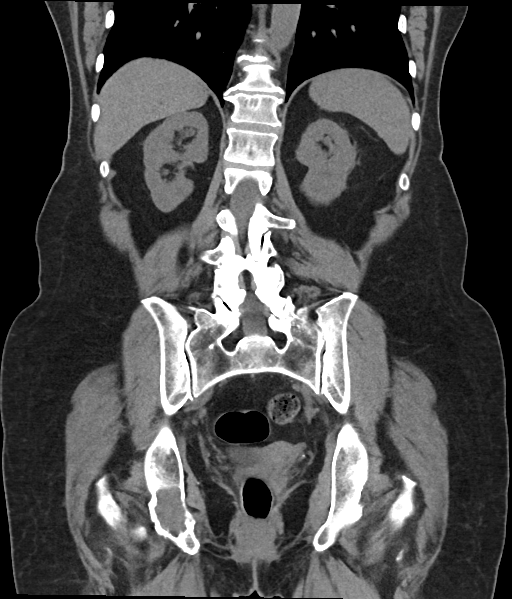
[im 46/103  soft-tissue]
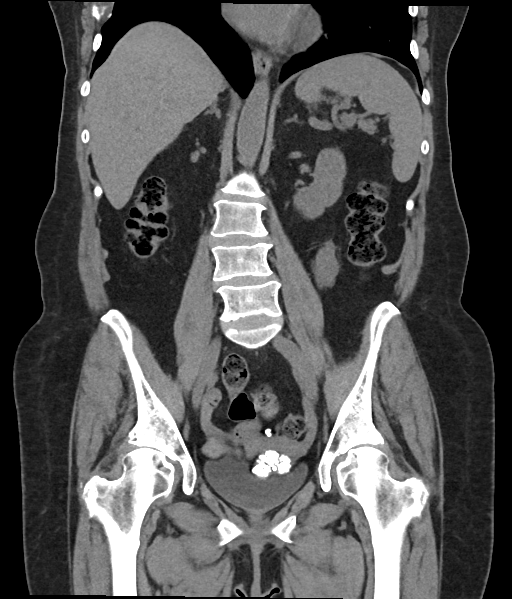
[im 57/103  soft-tissue]
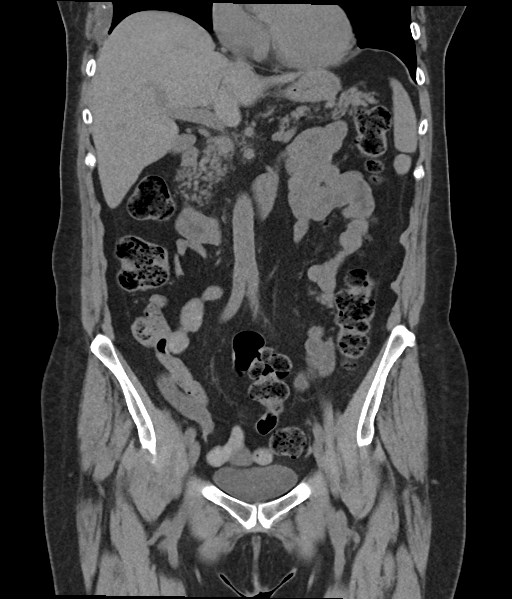

[16 of 46 positions shown; findings below may reference images not displayed]

FINDINGS: Lower chest: Minimal bibasilar scarring.

Hepatobiliary: Small cyst RIGHT lobe liver 11 mm diameter increased
since [NF]. Gallbladder and liver otherwise normal appearance.

Pancreas: Normal appearance

Spleen: Normal appearance

Adrenals/Urinary Tract: Adrenal glands and kidneys normal appearance
without renal mass are hydronephrosis. Normal appearing ureters and
bladder. No urinary tract calcification.

Stomach/Bowel: Stool throughout colon. Normal appendix. Stomach
decompressed. Remaining bowel loops unremarkable.

Vascular/Lymphatic: Aorta normal caliber.  No adenopathy.

Reproductive: Calcified exophytic leiomyoma at uterine fundus 3.6 cm
diameter. Additional small calcified subserosal leiomyoma RIGHT
uterus 8 mm diameter. Ovaries unremarkable.

Other: No free air or free fluid. No hernia or inflammatory process.

Musculoskeletal: Lytic bone lesions RIGHT seventh rib, RIGHT
ischium, and superior T12 vertebral body consistent with history of
multiple myeloma.
IMPRESSION: No acute intra-abdominal or intrapelvic abnormalities.

Multiple lytic bone lesions consistent with history of multiple
myeloma as above.

Calcified uterine fibroids.

## 2021-03-18 MED ORDER — DEXAMETHASONE 4 MG PO TABS: 8.0000 mg | ORAL_TABLET | Freq: Two times a day (BID) | ORAL | 0 refills | Status: AC

## 2021-03-18 MED ORDER — FLUCONAZOLE 150 MG PO TABS: 150.0000 mg | ORAL_TABLET | Freq: Every day | ORAL | 0 refills | Status: DC

## 2021-03-18 MED ORDER — DEXAMETHASONE SODIUM PHOSPHATE 10 MG/ML IJ SOLN
30.0000 mg | Freq: Once | INTRAMUSCULAR | Status: AC
  Administered 2021-03-18: 30 mg via INTRAVENOUS
  Filled 2021-03-18: qty 3

## 2021-03-18 MED ORDER — OXYCODONE HCL 5 MG PO TABS: 5.0000 mg | ORAL_TABLET | ORAL | 0 refills | Status: DC | PRN

## 2021-03-18 MED ORDER — PANTOPRAZOLE SODIUM 40 MG PO TBEC: 40.0000 mg | DELAYED_RELEASE_TABLET | Freq: Every day | ORAL | 0 refills | Status: DC

## 2021-03-18 NOTE — Telephone Encounter (Signed)
Received call from pt stating that she is experiencing severe increased lower back for the last several nights. She cannot sleep due to the pain.  She has tried Tylenol and that is not helping at all per pt. ?She had a lumbar MRI in October  2022 showing degenerative changes in her lumbar spine-see scan report on 10/16/20. ? ?Please advise.  She is not scheduled to see Dr. Lorenso Courier or Dede Query, PA this week ?

## 2021-03-18 NOTE — Telephone Encounter (Signed)
Discussed pt's situation with Dede Query, PA. She recommended that the quickest way to evaluate her lower back pain is to go to the ED. ?TCT patient and advised that she go to ED for Eval. Offered choice of [facility] ED or ED @ [facility]. Pt is in agreement and will go to one of those facilities. ?

## 2021-03-18 NOTE — ED Provider Notes (Signed)
[facility] EMERGENCY DEPT Provider Note   CSN: [id number] Arrival date & time: 03/18/21  1653     History  Chief Complaint  Patient presents with   Back Pain    Jordan Gregory is a 66 y.o. female.  HPI 66 year old female with a history of multiple myeloma currently being treated presents with severe back pain.  She has had some low back pain for a couple weeks and before this has had issues with her back on and off that has been put on the back burner because of the new multiple myeloma diagnosis.  However she is never had pack pain this severe and over the last 4 days has been severe enough that she cannot sleep.  It is primarily lumbar and maybe a little bit on the right side.  She denies any numbness in her lower extremities or weakness and no incontinence.  She does feel like there is a soreness to her left thigh but is not numb.  No saddle anesthesia.  Has been taking Tylenol but has no relief from this.  She feels like there is a soreness in her abdomen and she certainly has soreness from getting the subcutaneous shots but she is not really having abdominal pain.  Called the on-call oncology nurse who told her to come to this ER for evaluation and a scan.  Home Medications Prior to Admission medications   Medication Sig Start Date End Date Taking? Authorizing Provider  dexamethasone (DECADRON) 4 MG tablet Take 2 tablets (8 mg total) by mouth 2 (two) times daily for 7 days. 03/19/21 03/26/21 Yes Sherwood Gambler, MD  fluconazole (DIFLUCAN) 150 MG tablet Take 1 tablet (150 mg total) by mouth daily. 03/18/21  Yes Sherwood Gambler, MD  oxyCODONE (ROXICODONE) 5 MG immediate release tablet Take 1 tablet (5 mg total) by mouth every 4 (four) hours as needed for severe pain. 03/18/21  Yes Sherwood Gambler, MD  pantoprazole (PROTONIX) 40 MG tablet Take 1 tablet (40 mg total) by mouth daily. 03/18/21  Yes Sherwood Gambler, MD  acyclovir (ZOVIRAX) 400 MG tablet Take 1 tablet (400  mg total) by mouth 2 (two) times daily. 01/09/21   Lincoln Brigham, PA-C  allopurinol (ZYLOPRIM) 300 MG tablet Take 1 tablet (300 mg total) by mouth daily. 01/28/21   Orson Slick, MD  amLODipine (NORVASC) 10 MG tablet Take 10 mg by mouth daily. 12/14/20   [provider]  calcium carbonate (OS-CAL) 600 MG TABS Take 600 mg by mouth daily.    [provider]  citalopram (CELEXA) 10 MG tablet Take 1 tablet (10 mg total) by mouth daily. 12/19/20   Nafziger, Tommi Rumps, NP  Dexamethasone 20 MG TABS Take 40 mg by mouth once a week. Once a week. 01/09/21   Lincoln Brigham, PA-C  levothyroxine (SYNTHROID) 75 MCG tablet TAKE 1 TABLET BY MOUTH DAILY BEFORE BREAKFAST. 01/25/21   Nafziger, Tommi Rumps, NP  ondansetron (ZOFRAN) 8 MG tablet Take 1 tablet (8 mg total) by mouth every 8 (eight) hours as needed for nausea or vomiting. 01/09/21   Lincoln Brigham, PA-C  prochlorperazine (COMPAZINE) 10 MG tablet Take 1 tablet (10 mg total) by mouth every 6 (six) hours as needed for nausea or vomiting. Patient not taking: Reported on 02/06/2021 01/09/21   Dede Query T, PA-C  traZODone (DESYREL) 50 MG tablet TAKE 0.5-1 TABLETS BY MOUTH AT BEDTIME AS NEEDED FOR SLEEP. 09/25/20   Nafziger, Tommi Rumps, NP  VITAMIN D, CHOLECALCIFEROL, PO Take 2,000 Int'l  Units by mouth daily.    [provider]      Allergies    Ramipril    Review of Systems   Review of Systems  Constitutional:  Negative for fever.  Genitourinary:  Negative for dysuria and hematuria.  Musculoskeletal:  Positive for back pain.  Neurological:  Negative for weakness and numbness.   Physical Exam Updated Vital Signs BP (!) 155/82    Pulse 84    Temp 98.3 F (36.8 C) (Oral)    Resp 16    LMP 03/04/2011    SpO2 99%  Physical Exam Vitals and nursing note reviewed.  Constitutional:      Appearance: She is well-developed.  HENT:     Head: Normocephalic and atraumatic.  Cardiovascular:     Rate and Rhythm: Normal rate and regular rhythm.     Heart  sounds: Normal heart sounds.  Pulmonary:     Effort: Pulmonary effort is normal.     Breath sounds: Normal breath sounds.  Abdominal:     Palpations: Abdomen is soft.     Tenderness: There is no abdominal tenderness.  Musculoskeletal:     Thoracic back: No tenderness.     Lumbar back: Tenderness and bony tenderness present.     Comments: Mild low lumbar midline and right sided tenderness  Skin:    General: Skin is warm and dry.  Neurological:     Mental Status: She is alert.     Comments: 5/5 strength in BLE. Grossly normal sensation    ED Results / Procedures / Treatments   Labs (all labs ordered are listed, but only abnormal results are displayed) Labs Reviewed  COMPREHENSIVE METABOLIC PANEL - Abnormal; Notable for the following components:      Result Value   Creatinine, Ser 1.94 (*)    GFR, Estimated 28 (*)    All other components within normal limits  CBC WITH DIFFERENTIAL/PLATELET - Abnormal; Notable for the following components:   WBC 2.4 (*)    RBC 3.10 (*)    Hemoglobin 9.3 (*)    HCT 27.2 (*)    RDW 16.8 (*)    Neutro Abs 1.5 (*)    Lymphs Abs 0.5 (*)    All other components within normal limits  LIPASE, BLOOD  URINALYSIS, ROUTINE W REFLEX MICROSCOPIC    EKG None  Radiology CT ABDOMEN PELVIS WO CONTRAST  Result Date: 03/18/2021 CLINICAL DATA:  Central and RIGHT lower back pain and BILATERAL lower abdominal pain for a few weeks, history of multiple myeloma on chemotherapy EXAM: CT ABDOMEN AND PELVIS WITHOUT CONTRAST TECHNIQUE: Multidetector CT imaging of the abdomen and pelvis was performed following the standard protocol without IV contrast. IV contrast not administered due to renal dysfunction. No oral contrast. RADIATION DOSE REDUCTION: This exam was performed according to the departmental dose-optimization program which includes automated exposure control, adjustment of the mA and/or kV according to patient size and/or use of iterative reconstruction  technique. COMPARISON:  09/02/2012 FINDINGS: Lower chest: Minimal bibasilar scarring. Hepatobiliary: Small cyst RIGHT lobe liver 11 mm diameter increased since 2014. Gallbladder and liver otherwise normal appearance. Pancreas: Normal appearance Spleen: Normal appearance Adrenals/Urinary Tract: Adrenal glands and kidneys normal appearance without renal mass are hydronephrosis. Normal appearing ureters and bladder. No urinary tract calcification. Stomach/Bowel: Stool throughout colon. Normal appendix. Stomach decompressed. Remaining bowel loops unremarkable. Vascular/Lymphatic: Aorta normal caliber.  No adenopathy. Reproductive: Calcified exophytic leiomyoma at uterine fundus 3.6 cm diameter. Additional small calcified subserosal leiomyoma RIGHT uterus 8  mm diameter. Ovaries unremarkable. Other: No free air or free fluid. No hernia or inflammatory process. Musculoskeletal: Lytic bone lesions RIGHT seventh rib, RIGHT ischium, and superior T12 vertebral body consistent with history of multiple myeloma. IMPRESSION: No acute intra-abdominal or intrapelvic abnormalities. Multiple lytic bone lesions consistent with history of multiple myeloma as above. Calcified uterine fibroids. Electronically Signed   By: Lavonia Dana M.D.   On: 03/18/2021 18:19    Procedures Procedures    Medications Ordered in ED Medications  dexamethasone (DECADRON) injection 30 mg (30 mg Intravenous Given 03/18/21 2021)    ED Course/ Medical Decision Making/ A&P                           Medical Decision Making Amount and/or Complexity of Data Reviewed Labs: ordered. Radiology: ordered.   I suspect the patient's pain is coming from the lytic lesion in her T12 vertebral body.  This seems to be coming from her multiple myeloma.  She has no focal neurodeficits or incontinence so my suspicion of acute spinal cord emergency is pretty low.  I discussed she may need further imaging with MRI at some point but I do not think it emergently  needs to be done.  Otherwise, a chief complaint for her is pain and we will treat her with oxycodone at home.  She drove herself here so I cannot give her anything stronger here.  Cannot tolerate NSAIDs because of her chronic kidney disease.  Labs reviewed and her CKD seems to be slightly improved or at least baseline.  Chronic leukopenia on lab review.  Otherwise, CT images viewed by myself and I agree with radiology.  No obvious intra-abdominal emergency.  Chart reviewed. I discussed with oncology, Dr. Marin Olp.  He is on-call for patient's oncologist, Dr. Lorenso Courier.  He recommends Decadron 30 mg IV now and doing daily dexamethasone 8 mg twice daily.  Advises getting Diflucan for preventing thrush and a PPI.  She is on 40 mg dexamethasone once a week but he advises increasing this currently given the new symptoms.  She will need close follow-up with her outpatient oncology office and is advised to call them tomorrow but per oncology there is no indication for any further emergent treatments now.  We discussed return precautions.  She appears stable for discharge home.       Final Clinical Impression(s) / ED Diagnoses Final diagnoses:  Acute midline low back pain without sciatica    Rx / DC Orders ED Discharge Orders          Ordered    dexamethasone (DECADRON) 4 MG tablet  2 times daily        03/18/21 2001    oxyCODONE (ROXICODONE) 5 MG immediate release tablet  Every 4 hours PRN        03/18/21 2001    pantoprazole (PROTONIX) 40 MG tablet  Daily        03/18/21 2001    fluconazole (DIFLUCAN) 150 MG tablet  Daily        03/18/21 Monte Fantasia, MD 03/18/21 2025

## 2021-03-18 NOTE — ED Triage Notes (Signed)
Pt presents with lower back pain radiating around to her abd for a "few weeks". It has become "intolerable" the last 4 nights. Pt receiving chemo at [facility] center for multiple myeloma every Wednesday. Pt going to PT for chronic leg pain, recent scan suggest "disc" problems.  ? ?

## 2021-03-18 NOTE — Discharge Instructions (Addendum)
There is a lesion consistent with multiple myeloma at T12, this is probably causing her pain.  We are starting you on higher doses of steroids and needed to follow-up with Dr. Libby Maw office.  Start the steroids tomorrow as you were given a dose here tonight. ? ?Otherwise, if you develop worsening, recurrent, or continued back pain, numbness or weakness in the legs, incontinence of your bowels or bladders, numbness of your buttocks, fever, abdominal pain, or any other new/concerning symptoms then return to the ER for evaluation.  ?

## 2021-03-19 ENCOUNTER — Telehealth: Payer: Self-pay

## 2021-03-19 NOTE — Telephone Encounter (Signed)
Patient went to ED on 03/18/21 for acute midline low back pain. Called patient to see how she is doing and to assess for needs. Patient prescribed Decadron and Oxycodone. Pain "much better". Patient has apt with Dede Query PA on 03/26/21. Patient has questions about superior T12 vertebral body lytic lesion that was reported on CT scan performed on 3/13. She declined call from provider and stated that she can wait until appointment on 03/26/21. ?Educated pt on use of Miralax and Senna while using Oxycodone. Encouarged pt to call with questions or concerns. ?

## 2021-03-20 ENCOUNTER — Inpatient Hospital Stay: Payer: 59

## 2021-03-20 ENCOUNTER — Other Ambulatory Visit: Payer: Self-pay

## 2021-03-20 ENCOUNTER — Other Ambulatory Visit: Payer: Self-pay | Admitting: Hematology

## 2021-03-20 VITALS — BP 151/84 | HR 93 | Temp 97.9°F | Resp 16 | Ht 67.0 in | Wt 184.0 lb

## 2021-03-20 DIAGNOSIS — C9000 Multiple myeloma not having achieved remission: Secondary | ICD-10-CM

## 2021-03-20 DIAGNOSIS — Z5111 Encounter for antineoplastic chemotherapy: Secondary | ICD-10-CM | POA: Diagnosis not present

## 2021-03-20 LAB — CMP (CANCER CENTER ONLY)
ALT: 23 U/L (ref 0–44)
AST: 23 U/L (ref 15–41)
Albumin: 4.8 g/dL (ref 3.5–5.0)
Alkaline Phosphatase: 62 U/L (ref 38–126)
Anion gap: 11 (ref 5–15)
BUN: 37 mg/dL — ABNORMAL HIGH (ref 8–23)
CO2: 22 mmol/L (ref 22–32)
Calcium: 9.6 mg/dL (ref 8.9–10.3)
Chloride: 105 mmol/L (ref 98–111)
Creatinine: 2.16 mg/dL — ABNORMAL HIGH (ref 0.44–1.00)
GFR, Estimated: 25 mL/min — ABNORMAL LOW (ref >60)
Glucose, Bld: 195 mg/dL — ABNORMAL HIGH (ref 70–99)
Potassium: 4.1 mmol/L (ref 3.5–5.1)
Sodium: 138 mmol/L (ref 135–145)
Total Bilirubin: 0.6 mg/dL (ref 0.3–1.2)
Total Protein: 7.5 g/dL (ref 6.5–8.1)

## 2021-03-20 LAB — CBC WITH DIFFERENTIAL (CANCER CENTER ONLY)
Abs Immature Granulocytes: 0.09 10*3/uL — ABNORMAL HIGH (ref 0.00–0.07)
Basophils Absolute: 0 10*3/uL (ref 0.0–0.1)
Basophils Relative: 0 %
Eosinophils Absolute: 0 10*3/uL (ref 0.0–0.5)
Eosinophils Relative: 0 %
HCT: 26.2 % — ABNORMAL LOW (ref 36.0–46.0)
Hemoglobin: 9.1 g/dL — ABNORMAL LOW (ref 12.0–15.0)
Immature Granulocytes: 1 %
Lymphocytes Relative: 2 %
Lymphs Abs: 0.2 10*3/uL — ABNORMAL LOW (ref 0.7–4.0)
MCH: 31.3 pg (ref 26.0–34.0)
MCHC: 34.7 g/dL (ref 30.0–36.0)
MCV: 90 fL (ref 80.0–100.0)
Monocytes Absolute: 0.3 10*3/uL (ref 0.1–1.0)
Monocytes Relative: 2 %
Neutro Abs: 10.3 10*3/uL — ABNORMAL HIGH (ref 1.7–7.7)
Neutrophils Relative %: 95 %
Platelet Count: 208 10*3/uL (ref 150–400)
RBC: 2.91 MIL/uL — ABNORMAL LOW (ref 3.87–5.11)
RDW: 17.6 % — ABNORMAL HIGH (ref 11.5–15.5)
WBC Count: 10.9 10*3/uL — ABNORMAL HIGH (ref 4.0–10.5)
nRBC: 0 % (ref 0.0–0.2)

## 2021-03-20 LAB — LACTATE DEHYDROGENASE: LDH: 194 U/L — ABNORMAL HIGH (ref 98–192)

## 2021-03-20 MED ORDER — SODIUM CHLORIDE 0.9 % IV SOLN: Freq: Once | INTRAVENOUS | Status: AC

## 2021-03-20 MED ORDER — PALONOSETRON HCL INJECTION 0.25 MG/5ML
0.2500 mg | Freq: Once | INTRAVENOUS | Status: AC
  Administered 2021-03-20: 0.25 mg via INTRAVENOUS
  Filled 2021-03-20: qty 5

## 2021-03-20 MED ORDER — BORTEZOMIB CHEMO SQ INJECTION 3.5 MG (2.5MG/ML)
1.5000 mg/m2 | Freq: Once | INTRAMUSCULAR | Status: AC
  Administered 2021-03-20: 3 mg via SUBCUTANEOUS
  Filled 2021-03-20: qty 1.2

## 2021-03-20 MED ORDER — SODIUM CHLORIDE 0.9 % IV SOLN
300.0000 mg/m2 | Freq: Once | INTRAVENOUS | Status: AC
  Administered 2021-03-20: 600 mg via INTRAVENOUS
  Filled 2021-03-20: qty 30

## 2021-03-20 NOTE — Patient Instructions (Signed)
[facility]  Discharge Instructions: ?Thank you for choosing [facility] to provide your oncology and hematology care.  ? ?If you have a lab appointment with the [facility], please go directly to the [facility] and check in at the registration area. ?  ?Wear comfortable clothing and clothing appropriate for easy access to any Portacath or PICC line.  ? ?We strive to give you quality time with your provider. You may need to reschedule your appointment if you arrive late (15 or more minutes).  Arriving late affects you and other patients whose appointments are after yours.  Also, if you miss three or more appointments without notifying the office, you may be dismissed from the clinic at the provider?s discretion.    ?  ?For prescription refill requests, have your pharmacy contact our office and allow 72 hours for refills to be completed.   ? ?Today you received the following chemotherapy and/or immunotherapy agents: Velcade, Cytoxan.     ?  ?To help prevent nausea and vomiting after your treatment, we encourage you to take your nausea medication as directed. ? ?BELOW ARE SYMPTOMS THAT SHOULD BE REPORTED IMMEDIATELY: ?*FEVER GREATER THAN 100.4 F (38 ?C) OR HIGHER ?*CHILLS OR SWEATING ?*NAUSEA AND VOMITING THAT IS NOT CONTROLLED WITH YOUR NAUSEA MEDICATION ?*UNUSUAL SHORTNESS OF BREATH ?*UNUSUAL BRUISING OR BLEEDING ?*URINARY PROBLEMS (pain or burning when urinating, or frequent urination) ?*BOWEL PROBLEMS (unusual diarrhea, constipation, pain near the anus) ?TENDERNESS IN MOUTH AND THROAT WITH OR WITHOUT PRESENCE OF ULCERS (sore throat, sores in mouth, or a toothache) ?UNUSUAL RASH, SWELLING OR PAIN  ?UNUSUAL VAGINAL DISCHARGE OR ITCHING  ? ?Items with * indicate a potential emergency and should be followed up as soon as possible or go to the Emergency Department if any problems should occur. ? ?Please show the CHEMOTHERAPY ALERT CARD or IMMUNOTHERAPY ALERT CARD at  check-in to the Emergency Department and triage nurse. ? ?Should you have questions after your visit or need to cancel or reschedule your appointment, please contact [facility]  Dept: [phone]  and follow the prompts.  Office hours are 8:00 a.m. to 4:30 p.m. Monday - Friday. Please note that voicemails left after 4:00 p.m. may not be returned until the following business day.  We are closed weekends and major holidays. You have access to a nurse at all times for urgent questions. Please call the main number to the clinic Dept: [phone] and follow the prompts. ? ? ?For any non-urgent questions, you may also contact your provider using MyChart. We now offer e-Visits for anyone 82 and older to request care online for non-urgent symptoms. For details visit [email or web address]. ?  ?Also download the MyChart app! Go to the app store, search "MyChart", open the app, select [facility], and log in with your MyChart username and password. ? ?Due to Covid, a mask is required upon entering the hospital/clinic. If you do not have a mask, one will be given to you upon arrival. For doctor visits, patients may have 1 support person aged 45 or older with them. For treatment visits, patients cannot have anyone with them due to current Covid guidelines and our immunocompromised population.  ? ?

## 2021-03-20 NOTE — Progress Notes (Signed)
Ok to treat with creatinine level of 2.16 mg/dL per Dr. Irene Limbo in Dr. Libby Maw absence.  ? ?Patient reports pain in back has resolved to the point that she has not had to take oxycodone since Monday night. She is feeling much better.   ?

## 2021-03-26 ENCOUNTER — Other Ambulatory Visit: Payer: Self-pay

## 2021-03-26 ENCOUNTER — Inpatient Hospital Stay (HOSPITAL_BASED_OUTPATIENT_CLINIC_OR_DEPARTMENT_OTHER): Payer: 59 | Admitting: Physician Assistant

## 2021-03-26 ENCOUNTER — Inpatient Hospital Stay: Payer: 59

## 2021-03-26 VITALS — BP 137/72 | HR 105 | Temp 97.5°F | Wt 179.2 lb

## 2021-03-26 VITALS — HR 91

## 2021-03-26 DIAGNOSIS — C9000 Multiple myeloma not having achieved remission: Secondary | ICD-10-CM

## 2021-03-26 DIAGNOSIS — R7989 Other specified abnormal findings of blood chemistry: Secondary | ICD-10-CM

## 2021-03-26 DIAGNOSIS — Z5111 Encounter for antineoplastic chemotherapy: Secondary | ICD-10-CM | POA: Diagnosis not present

## 2021-03-26 DIAGNOSIS — C9030 Solitary plasmacytoma not having achieved remission: Secondary | ICD-10-CM | POA: Diagnosis not present

## 2021-03-26 LAB — CBC WITH DIFFERENTIAL (CANCER CENTER ONLY)
Abs Immature Granulocytes: 0.29 10*3/uL — ABNORMAL HIGH (ref 0.00–0.07)
Basophils Absolute: 0 10*3/uL (ref 0.0–0.1)
Basophils Relative: 0 %
Eosinophils Absolute: 0 10*3/uL (ref 0.0–0.5)
Eosinophils Relative: 0 %
HCT: 29.1 % — ABNORMAL LOW (ref 36.0–46.0)
Hemoglobin: 10.1 g/dL — ABNORMAL LOW (ref 12.0–15.0)
Immature Granulocytes: 3 %
Lymphocytes Relative: 1 %
Lymphs Abs: 0.1 10*3/uL — ABNORMAL LOW (ref 0.7–4.0)
MCH: 30.5 pg (ref 26.0–34.0)
MCHC: 34.7 g/dL (ref 30.0–36.0)
MCV: 87.9 fL (ref 80.0–100.0)
Monocytes Absolute: 0.3 10*3/uL (ref 0.1–1.0)
Monocytes Relative: 3 %
Neutro Abs: 10.7 10*3/uL — ABNORMAL HIGH (ref 1.7–7.7)
Neutrophils Relative %: 93 %
Platelet Count: 282 10*3/uL (ref 150–400)
RBC: 3.31 MIL/uL — ABNORMAL LOW (ref 3.87–5.11)
RDW: 17.1 % — ABNORMAL HIGH (ref 11.5–15.5)
WBC Count: 11.4 10*3/uL — ABNORMAL HIGH (ref 4.0–10.5)
nRBC: 0.4 % — ABNORMAL HIGH (ref 0.0–0.2)

## 2021-03-26 LAB — CMP (CANCER CENTER ONLY)
ALT: 19 U/L (ref 0–44)
AST: 15 U/L (ref 15–41)
Albumin: 4.7 g/dL (ref 3.5–5.0)
Alkaline Phosphatase: 56 U/L (ref 38–126)
Anion gap: 15 (ref 5–15)
BUN: 56 mg/dL — ABNORMAL HIGH (ref 8–23)
CO2: 19 mmol/L — ABNORMAL LOW (ref 22–32)
Calcium: 9.6 mg/dL (ref 8.9–10.3)
Chloride: 99 mmol/L (ref 98–111)
Creatinine: 2.65 mg/dL — ABNORMAL HIGH (ref 0.44–1.00)
GFR, Estimated: 19 mL/min — ABNORMAL LOW (ref >60)
Glucose, Bld: 182 mg/dL — ABNORMAL HIGH (ref 70–99)
Potassium: 4.7 mmol/L (ref 3.5–5.1)
Sodium: 133 mmol/L — ABNORMAL LOW (ref 135–145)
Total Bilirubin: 0.8 mg/dL (ref 0.3–1.2)
Total Protein: 7.3 g/dL (ref 6.5–8.1)

## 2021-03-26 LAB — LACTATE DEHYDROGENASE: LDH: 245 U/L — ABNORMAL HIGH (ref 98–192)

## 2021-03-26 MED ORDER — PALONOSETRON HCL INJECTION 0.25 MG/5ML
0.2500 mg | Freq: Once | INTRAVENOUS | Status: AC
  Administered 2021-03-26: 0.25 mg via INTRAVENOUS
  Filled 2021-03-26: qty 5

## 2021-03-26 MED ORDER — BORTEZOMIB CHEMO SQ INJECTION 3.5 MG (2.5MG/ML)
1.5000 mg/m2 | Freq: Once | INTRAMUSCULAR | Status: AC
  Administered 2021-03-26: 3 mg via SUBCUTANEOUS
  Filled 2021-03-26: qty 1.2

## 2021-03-26 MED ORDER — SODIUM CHLORIDE 0.9 % IV SOLN: Freq: Once | INTRAVENOUS | Status: AC

## 2021-03-26 MED ORDER — SODIUM CHLORIDE 0.9 % IV SOLN
300.0000 mg/m2 | Freq: Once | INTRAVENOUS | Status: AC
  Administered 2021-03-26: 600 mg via INTRAVENOUS
  Filled 2021-03-26: qty 30

## 2021-03-26 MED ORDER — SODIUM CHLORIDE 0.9 % IV SOLN: INTRAVENOUS | Status: AC

## 2021-03-26 NOTE — Progress Notes (Signed)
?[name] ?Telephone:([phone]) [phone]   [phone] ? ?PROGRESS NOTE ? ?Patient Care Team: ?Dorothyann Peng, NP as PCP - General (Family Medicine) ?Orson Slick, MD as Consulting Physician (Hematology and Oncology) ?Harriett Sine, MD as Consulting Physician (Dermatology) ? 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  Serological testing concerning for a plasma cell neoplasm, with UPEP showing 7.4 g of protein daily with markedly high lambda light chains ?-11/07/2020: Bone marrow biopsy performed, shows no evidence of multiple myeloma ?-11/23/2020: Biopsy of right clavicle mass confirmed plasmacytoma.  ?-11/27/2020: PET scan show intensely hypermetabolic large soft tissue masses arising from the LEFT and RIGHT ribs. Two large lesions in the LEFT chest wall ?and a single large lesion in the RIGHT chest wall.Hypermetabolic expansile solid masses arising from the medial RIGHT clavicle and RIGHT inferior pubic ischium ?01/09/2021: Cycle 1, Day 1 CyBorD ?02/06/2021: Cycle 2, Day 1 CyBorD ?03/06/2021: Cycle 3, Day 1 CyBorD ? ?Interval History:  ?Jordan Gregory 66 y.o. 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 The patient was last seen on 03/13/2021. She is here for Cycle 3, Day 22 of CyBorD.  ? ?On exam today Jordan Gregory went to the ED on 03/18/2021 for worsening low back pain. CT scan of the abdomen/pelvis was obtained that didn't show any new findings. Lytic lesions were seen again in the right ischium and T12 vertebral body. Patient was give IV steroids and then discharged on dexamethasone 8 mg BID x 7 days. She reports that her back pain has improved some and she is now able to sleep through  the night. She took only one dose of oxycodone for the back pain this past week. She reports feeling jittery over the past week. Her last dose of steroids was yesterday.  ? ?Additionally, patient reports constipation with her last bowel movement last Thursday. She took miralax for the last couple of days without any improvement. She denies nausea, vomiting or abdominal pain. She denies bruising or signs of bleeding. She has some dizziness without any syncopal episodes. Patient denies fevers, chills, night sweats, shortness of breath, chest pain, cough, edema or neuropathy. She has no other complaints.  Rest of the 10 point ROS is below. ? ?MEDICAL HISTORY:  ?Past Medical History:  ?Diagnosis Date  ? Hypertension   ? Insomnia   ? Plasmacytoma ([place])   ? Thyroid disease   ? Vitamin D deficiency   ? ? ?SURGICAL HISTORY: ?Past Surgical History:  ?Procedure Laterality Date  ? MOHS SURGERY  2016  ? ? ?SOCIAL HISTORY: ?Social History  ? ?Socioeconomic History  ? Marital status: Married  ?  Spouse name: Not on file  ? Number of children: Not on file  ? Years of education: Not on file  ? Highest education level: Not on file  ?Occupational History  ? Not on file  ?Tobacco Use  ? Smoking status: Never  ? Smokeless tobacco: Never  ?Vaping Use  ? Vaping Use: Never used  ?Substance and Sexual Activity  ? Alcohol use: Yes  ?  Alcohol/week: 5.0 standard drinks  ?  Types: 5 Standard drinks or equivalent per week  ?  Comment: regular  ? Drug use: No  ? Sexual activity: Yes  ?  Birth control/protection: Post-menopausal  ?  Comment: 1st intercourse 80 yo-5 partners  ?Other Topics Concern  ? Not on file  ?Social History Narrative  ? She works at [employer].   ? Married - 23 years   ? No kids   ?   ? She likes to travel, read, cycle, be outside.   ? ?Social Determinants of Health  ? ?Financial Resource Strain: Not on file  ?Food Insecurity: Not on file  ?Transportation Needs: No Transportation Needs  ? Lack of Transportation  (Medical): No  ? Lack of Transportation (Non-Medical): No  ?Physical Activity: Not on file  ?Stress: Not on file  ?Social Connections: Not on file  ?Intimate Partner Violence: Not At Risk  ? Fear of Current or Ex-Partner: No  ? Emotionally Abused: No  ? Physically Abused: No  ? Sexually Abused: No  ? ? ?FAMILY HISTORY: ?Family History  ?Problem Relation Age of Onset  ? Arthritis Mother   ? Hypertension Mother   ? Dementia Mother   ? Heart disease Father   ? Liver cancer Father   ? Asperger's syndrome Brother   ? Atrial fibrillation Brother   ? ? ?ALLERGIES:  is allergic to ramipril. ? ?MEDICATIONS:  ?Current Outpatient Medications  ?Medication Sig Dispense Refill  ? acyclovir (ZOVIRAX) 400 MG tablet Take 1 tablet (400 mg total) by mouth 2 (two) times daily. 60 tablet 2  ? allopurinol (ZYLOPRIM) 300 MG tablet Take 1 tablet (300 mg total) by mouth daily. 30 tablet 3  ? amLODipine (NORVASC) 10 MG tablet Take 10 mg by mouth daily.    ? calcium carbonate (OS-CAL) 600 MG TABS Take 600 mg by mouth daily.    ? citalopram (CELEXA) 10 MG tablet TAKE 1 TABLET BY MOUTH EVERY DAY 90 tablet 0  ? dexamethasone (DECADRON) 4 MG tablet Take 2 tablets (8 mg total) by mouth 2 (two) times daily for 7 days. 28 tablet 0  ? Dexamethasone 20 MG TABS Take 40 mg by mouth once a week. Once a week. 60 tablet 3  ? fluconazole (DIFLUCAN) 150 MG tablet Take 1 tablet (150 mg total) by mouth daily. 7 tablet 0  ? levothyroxine (SYNTHROID) 75 MCG tablet TAKE 1 TABLET BY MOUTH DAILY BEFORE BREAKFAST. 90 tablet 3  ? ondansetron (ZOFRAN) 8 MG tablet Take 1 tablet (8 mg total) by mouth every 8 (eight) hours as needed for nausea or vomiting. 60 tablet 2  ? oxyCODONE (ROXICODONE) 5 MG immediate release tablet Take 1 tablet (5 mg total) by mouth every 4 (four) hours as needed for severe pain. 15 tablet 0  ? pantoprazole (PROTONIX) 40 MG tablet Take 1 tablet (40 mg total) by mouth daily. 7 tablet 0  ? prochlorperazine (COMPAZINE) 10 MG tablet Take 1 tablet  (10 mg total) by mouth every 6 (six) hours as needed for nausea or vomiting. 60 tablet 2  ? traZODone (DESYREL) 50 MG tablet TAKE 0.5-1 TABLETS BY MOUTH AT BEDTIME AS NEEDED FOR SLEEP. 90 tablet 1  ? VITAMIN D, CHOLECALCIFEROL, PO Take 2,000 Int'l Units by mouth daily.    ? ?No current facility-administered medications for this visit.  ? ? ?REVIEW OF SYSTEMS:   ?Constitutional: ( - ) fevers, ( - )  chills , ( - ) night sweats ?Eyes: ( - ) blurriness of vision, ( - ) double vision, ( - ) watery eyes ?Ears, nose, mouth, throat, and face: ( - ) mucositis, ( - ) sore throat ?Respiratory: ( - ) cough, ( - ) dyspnea, ( - )  wheezes ?Cardiovascular: ( - ) palpitation, ( - ) chest discomfort, ( - ) lower extremity swelling ?Gastrointestinal:  ( - ) nausea, ( - ) heartburn, ( + ) change in bowel habits ?Skin: ( - ) abnormal skin rashes ?Lymphatics: ( - ) new lymphadenopathy, ( - ) easy bruising ?Neurological: ( - ) numbness, ( - ) tingling, ( - ) new weaknesses ?Behavioral/Psych: ( - ) mood change, ( - ) new changes  ?All other systems were reviewed with the patient and are negative. ? ?PHYSICAL EXAMINATION: ?ECOG PERFORMANCE STATUS: 1 - Symptomatic but completely ambulatory ? ?Vitals:  ? 03/26/21 1452  ?BP: 137/72  ?Pulse: (!) 105  ?Temp: (!) 97.5 ?F (36.4 ?C)  ?SpO2: 100%  ? ?Filed Weights  ? 03/26/21 1452  ?Weight: 179 lb 3.2 oz (81.3 kg)  ? ? ?GENERAL: Well-appearing middle-age Caucasian female, alert, no distress and comfortable ?SKIN: skin color, texture, turgor are normal, no rashes or significant lesions ?EYES: conjunctiva are pink and non-injected, sclera clear ?CHEST: mass over right clavicle, firm but decreasing in size  ?LUNGS: clear to auscultation and percussion with normal breathing effort ?HEART: regular rate & rhythm and no murmurs and no lower extremity edema ?Musculoskeletal: no cyanosis of digits and no clubbing  ?PSYCH: alert & oriented x 3, fluent speech ?NEURO: no focal motor/sensory  deficits ? ?LABORATORY DATA:  ?I have reviewed the data as listed ?CBC Latest Ref Rng & Units 03/26/2021 03/20/2021 03/18/2021  ?WBC 4.0 - 10.5 K/uL 11.4(H) 10.9(H) 2.4(L)  ?Hemoglobin 12.0 - 15.0 g/dL 10.1(L) 9.1(L) 9.3(L)  ?[place]

## 2021-03-26 NOTE — Progress Notes (Signed)
Per Dr. Irene Limbo ok to treat today with creatinine at 2.65 mg/dL  ?

## 2021-03-26 NOTE — Patient Instructions (Signed)
[facility]  Discharge Instructions: ?Thank you for choosing [facility] to provide your oncology and hematology care.  ? ?If you have a lab appointment with the [facility], please go directly to the [facility] and check in at the registration area. ?  ?Wear comfortable clothing and clothing appropriate for easy access to any Portacath or PICC line.  ? ?We strive to give you quality time with your provider. You may need to reschedule your appointment if you arrive late (15 or more minutes).  Arriving late affects you and other patients whose appointments are after yours.  Also, if you miss three or more appointments without notifying the office, you may be dismissed from the clinic at the provider?s discretion.    ?  ?For prescription refill requests, have your pharmacy contact our office and allow 72 hours for refills to be completed.   ? ?Today you received the following chemotherapy and/or immunotherapy agents: Velcade, Cytoxan.     ?  ?To help prevent nausea and vomiting after your treatment, we encourage you to take your nausea medication as directed. ? ?BELOW ARE SYMPTOMS THAT SHOULD BE REPORTED IMMEDIATELY: ?*FEVER GREATER THAN 100.4 F (38 ?C) OR HIGHER ?*CHILLS OR SWEATING ?*NAUSEA AND VOMITING THAT IS NOT CONTROLLED WITH YOUR NAUSEA MEDICATION ?*UNUSUAL SHORTNESS OF BREATH ?*UNUSUAL BRUISING OR BLEEDING ?*URINARY PROBLEMS (pain or burning when urinating, or frequent urination) ?*BOWEL PROBLEMS (unusual diarrhea, constipation, pain near the anus) ?TENDERNESS IN MOUTH AND THROAT WITH OR WITHOUT PRESENCE OF ULCERS (sore throat, sores in mouth, or a toothache) ?UNUSUAL RASH, SWELLING OR PAIN  ?UNUSUAL VAGINAL DISCHARGE OR ITCHING  ? ?Items with * indicate a potential emergency and should be followed up as soon as possible or go to the Emergency Department if any problems should occur. ? ?Please show the CHEMOTHERAPY ALERT CARD or IMMUNOTHERAPY ALERT CARD at  check-in to the Emergency Department and triage nurse. ? ?Should you have questions after your visit or need to cancel or reschedule your appointment, please contact [facility]  Dept: [phone]  and follow the prompts.  Office hours are 8:00 a.m. to 4:30 p.m. Monday - Friday. Please note that voicemails left after 4:00 p.m. may not be returned until the following business day.  We are closed weekends and major holidays. You have access to a nurse at all times for urgent questions. Please call the main number to the clinic Dept: [phone] and follow the prompts. ? ? ?For any non-urgent questions, you may also contact your provider using MyChart. We now offer e-Visits for anyone 5 and older to request care online for non-urgent symptoms. For details visit [email or web address]. ?  ?Also download the MyChart app! Go to the app store, search "MyChart", open the app, select [facility], and log in with your MyChart username and password. ? ?Due to Covid, a mask is required upon entering the hospital/clinic. If you do not have a mask, one will be given to you upon arrival. For doctor visits, patients may have 1 support person aged 22 or older with them. For treatment visits, patients cannot have anyone with them due to current Covid guidelines and our immunocompromised population.  ? ?

## 2021-03-27 ENCOUNTER — Other Ambulatory Visit: Payer: Self-pay | Admitting: Adult Health

## 2021-03-27 DIAGNOSIS — G479 Sleep disorder, unspecified: Secondary | ICD-10-CM

## 2021-03-27 LAB — KAPPA/LAMBDA LIGHT CHAINS
Kappa free light chain: 7.5 mg/L (ref 3.3–19.4)
Kappa, lambda light chain ratio: 0.28 (ref 0.26–1.65)
Lambda free light chains: 26.8 mg/L — ABNORMAL HIGH (ref 5.7–26.3)

## 2021-03-29 LAB — MULTIPLE MYELOMA PANEL, SERUM
Albumin SerPl Elph-Mcnc: 4.3 g/dL (ref 2.9–4.4)
Albumin/Glob SerPl: 1.6 (ref 0.7–1.7)
Alpha 1: 0.3 g/dL (ref 0.0–0.4)
Alpha2 Glob SerPl Elph-Mcnc: 0.9 g/dL (ref 0.4–1.0)
B-Globulin SerPl Elph-Mcnc: 0.9 g/dL (ref 0.7–1.3)
Gamma Glob SerPl Elph-Mcnc: 0.8 g/dL (ref 0.4–1.8)
Globulin, Total: 2.8 g/dL (ref 2.2–3.9)
IgA: 27 mg/dL — ABNORMAL LOW (ref 87–352)
IgG (Immunoglobin G), Serum: 758 mg/dL (ref 586–1602)
IgM (Immunoglobulin M), Srm: 30 mg/dL (ref 26–217)
Total Protein ELP: 7.1 g/dL (ref 6.0–8.5)

## 2021-04-03 ENCOUNTER — Inpatient Hospital Stay (HOSPITAL_BASED_OUTPATIENT_CLINIC_OR_DEPARTMENT_OTHER): Payer: 59 | Admitting: Hematology and Oncology

## 2021-04-03 ENCOUNTER — Inpatient Hospital Stay: Payer: 59

## 2021-04-03 ENCOUNTER — Other Ambulatory Visit: Payer: Self-pay

## 2021-04-03 VITALS — BP 144/75 | HR 85 | Temp 98.1°F | Resp 17 | Wt 180.7 lb

## 2021-04-03 DIAGNOSIS — C9030 Solitary plasmacytoma not having achieved remission: Secondary | ICD-10-CM

## 2021-04-03 DIAGNOSIS — C9000 Multiple myeloma not having achieved remission: Secondary | ICD-10-CM

## 2021-04-03 DIAGNOSIS — R7989 Other specified abnormal findings of blood chemistry: Secondary | ICD-10-CM

## 2021-04-03 DIAGNOSIS — Z5111 Encounter for antineoplastic chemotherapy: Secondary | ICD-10-CM | POA: Diagnosis not present

## 2021-04-03 LAB — CBC WITH DIFFERENTIAL (CANCER CENTER ONLY)
Abs Immature Granulocytes: 0.14 10*3/uL — ABNORMAL HIGH (ref 0.00–0.07)
Basophils Absolute: 0 10*3/uL (ref 0.0–0.1)
Basophils Relative: 0 %
Eosinophils Absolute: 0 10*3/uL (ref 0.0–0.5)
Eosinophils Relative: 1 %
HCT: 23.9 % — ABNORMAL LOW (ref 36.0–46.0)
Hemoglobin: 8.1 g/dL — ABNORMAL LOW (ref 12.0–15.0)
Immature Granulocytes: 5 %
Lymphocytes Relative: 5 %
Lymphs Abs: 0.2 10*3/uL — ABNORMAL LOW (ref 0.7–4.0)
MCH: 30.9 pg (ref 26.0–34.0)
MCHC: 33.9 g/dL (ref 30.0–36.0)
MCV: 91.2 fL (ref 80.0–100.0)
Monocytes Absolute: 0.2 10*3/uL (ref 0.1–1.0)
Monocytes Relative: 6 %
Neutro Abs: 2.6 10*3/uL (ref 1.7–7.7)
Neutrophils Relative %: 83 %
Platelet Count: 154 10*3/uL (ref 150–400)
RBC: 2.62 MIL/uL — ABNORMAL LOW (ref 3.87–5.11)
RDW: 16.9 % — ABNORMAL HIGH (ref 11.5–15.5)
WBC Count: 3.1 10*3/uL — ABNORMAL LOW (ref 4.0–10.5)
nRBC: 0 % (ref 0.0–0.2)

## 2021-04-03 LAB — CMP (CANCER CENTER ONLY)
ALT: 22 U/L (ref 0–44)
AST: 17 U/L (ref 15–41)
Albumin: 4 g/dL (ref 3.5–5.0)
Alkaline Phosphatase: 51 U/L (ref 38–126)
Anion gap: 9 (ref 5–15)
BUN: 21 mg/dL (ref 8–23)
CO2: 22 mmol/L (ref 22–32)
Calcium: 8.8 mg/dL — ABNORMAL LOW (ref 8.9–10.3)
Chloride: 108 mmol/L (ref 98–111)
Creatinine: 1.8 mg/dL — ABNORMAL HIGH (ref 0.44–1.00)
GFR, Estimated: 31 mL/min — ABNORMAL LOW (ref >60)
Glucose, Bld: 101 mg/dL — ABNORMAL HIGH (ref 70–99)
Potassium: 3.8 mmol/L (ref 3.5–5.1)
Sodium: 139 mmol/L (ref 135–145)
Total Bilirubin: 0.4 mg/dL (ref 0.3–1.2)
Total Protein: 6.2 g/dL — ABNORMAL LOW (ref 6.5–8.1)

## 2021-04-03 LAB — URIC ACID: Uric Acid, Serum: 3.8 mg/dL (ref 2.5–7.1)

## 2021-04-03 LAB — LACTATE DEHYDROGENASE: LDH: 236 U/L — ABNORMAL HIGH (ref 98–192)

## 2021-04-03 MED ORDER — BORTEZOMIB CHEMO SQ INJECTION 3.5 MG (2.5MG/ML)
1.5000 mg/m2 | Freq: Once | INTRAMUSCULAR | Status: AC
  Administered 2021-04-03: 3 mg via SUBCUTANEOUS
  Filled 2021-04-03: qty 1.2

## 2021-04-03 MED ORDER — SODIUM CHLORIDE 0.9 % IV SOLN
300.0000 mg/m2 | Freq: Once | INTRAVENOUS | Status: AC
  Administered 2021-04-03: 600 mg via INTRAVENOUS
  Filled 2021-04-03: qty 30

## 2021-04-03 MED ORDER — PALONOSETRON HCL INJECTION 0.25 MG/5ML
0.2500 mg | Freq: Once | INTRAVENOUS | Status: AC
  Administered 2021-04-03: 0.25 mg via INTRAVENOUS
  Filled 2021-04-03: qty 5

## 2021-04-03 MED ORDER — SODIUM CHLORIDE 0.9 % IV SOLN: Freq: Once | INTRAVENOUS | Status: AC

## 2021-04-03 NOTE — Progress Notes (Signed)
?[facility] ?Telephone:(336) [phone]   [phone] ? ?PROGRESS NOTE ? ?Patient Care Team: ?Dorothyann Peng, NP as PCP - General (Family Medicine) ?Orson Slick, MD as Consulting Physician (Hematology and Oncology) ?Harriett Sine, MD as Consulting Physician (Dermatology) ? 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  Serological testing concerning for a plasma cell neoplasm, with UPEP showing 7.4 g of protein daily with markedly high lambda light chains ?-11/07/2020: Bone marrow biopsy performed, shows no evidence of multiple myeloma ?-11/23/2020: Biopsy of right clavicle mass confirmed plasmacytoma.  ?-11/27/2020: PET scan show intensely hypermetabolic large soft tissue masses arising from the LEFT and RIGHT ribs. Two large lesions in the LEFT chest wall ?and a single large lesion in the RIGHT chest wall.Hypermetabolic expansile solid masses arising from the medial RIGHT clavicle and RIGHT inferior pubic ischium ?01/09/2021: Cycle 1, Day 1 CyBorD ?02/06/2021: Cycle 2, Day 1 CyBorD ?03/06/2021: Cycle 3, Day 1 CyBorD ?04/03/2021: Cycle 4, Day 1 CyBorD  ? ?Interval History:  ?Jordan Gregory 66 y.o. 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 The patient was last seen on 03/13/2021. She is here for Cycle 4, Day 1 of CyBorD.  ? ?On exam today Jordan Gregory reports that she has been well overall interim since her last visit.  She notes that her back pain is subsided and is not bothering her as much.  She notes that the oxycodone does occasionally cause some issues with constipation and that last time she can constipate she felt like she had a "broken plantar stomach".  She notes that magnesium  citrate has been helping to keep her regular and she has been feeling much better.  She reports that she does tend to feel fatigued/wiped out and has to take a rest if she takes a short walk or does the dishes.  He does occasionally have some episodes of dizziness when trying to stand up quickly.  She notes that the area on her clavicle appears to be continuing to decrease in size.  She denies any numbness or tingling her fingers and toes though has had some issues.  Itching.  She otherwise denies any fevers, chills, sweats, nausea, vomiting or diarrhea.  A full 10 point ROS is listed below.   rest of the 10 point ROS is below. ? ?MEDICAL HISTORY:  ?Past Medical History:  ?Diagnosis Date  ? Hypertension   ? Insomnia   ? Plasmacytoma ([place])   ? Thyroid disease   ? Vitamin D deficiency   ? ? ?SURGICAL HISTORY: ?Past Surgical History:  ?Procedure Laterality Date  ? MOHS SURGERY  2016  ? ? ?SOCIAL HISTORY: ?Social History  ? ?Socioeconomic History  ? Marital status: Married  ?  Spouse name: Not on file  ? Number of children: Not on file  ? Years of education: Not on file  ? Highest education level: Not on file  ?Occupational History  ? Not on file  ?Tobacco Use  ? Smoking status: Never  ? Smokeless tobacco: Never  ?Vaping Use  ? Vaping Use: Never used  ?Substance and Sexual Activity  ? Alcohol use: Yes  ?  Alcohol/week: 5.0 standard drinks  ?  Types: 5 Standard drinks or equivalent per week  ?  Comment: regular  ?  Drug use: No  ? Sexual activity: Yes  ?  Birth control/protection: Post-menopausal  ?  Comment: 1st intercourse 61 yo-5 partners  ?Other Topics Concern  ? Not on file  ?Social History Narrative  ? She works at [employer].   ? Married - 23 years   ? No kids   ?   ? She likes to travel, read, cycle, be outside.   ? ?Social Determinants of Health  ? ?Financial Resource Strain: Not on file  ?Food Insecurity: Not on file  ?Transportation Needs: No Transportation Needs  ? Lack of Transportation (Medical): No  ?  Lack of Transportation (Non-Medical): No  ?Physical Activity: Not on file  ?Stress: Not on file  ?Social Connections: Not on file  ?Intimate Partner Violence: Not At Risk  ? Fear of Current or Ex-Partner: No  ? Emotionally Abused: No  ? Physically Abused: No  ? Sexually Abused: No  ? ? ?FAMILY HISTORY: ?Family History  ?Problem Relation Age of Onset  ? Arthritis Mother   ? Hypertension Mother   ? Dementia Mother   ? Heart disease Father   ? Liver cancer Father   ? Asperger's syndrome Brother   ? Atrial fibrillation Brother   ? ? ?ALLERGIES:  is allergic to ramipril. ? ?MEDICATIONS:  ?Current Outpatient Medications  ?Medication Sig Dispense Refill  ? acyclovir (ZOVIRAX) 400 MG tablet Take 1 tablet (400 mg total) by mouth 2 (two) times daily. 60 tablet 2  ? allopurinol (ZYLOPRIM) 300 MG tablet TAKE 1 TABLET BY MOUTH EVERY DAY 90 tablet 1  ? amLODipine (NORVASC) 10 MG tablet Take 10 mg by mouth daily.    ? calcium carbonate (OS-CAL) 600 MG TABS Take 600 mg by mouth daily.    ? citalopram (CELEXA) 10 MG tablet TAKE 1 TABLET BY MOUTH EVERY DAY 90 tablet 0  ? Dexamethasone 20 MG TABS Take 40 mg by mouth once a week. Once a week. 60 tablet 3  ? fluconazole (DIFLUCAN) 150 MG tablet Take 1 tablet (150 mg total) by mouth daily. 7 tablet 0  ? levothyroxine (SYNTHROID) 75 MCG tablet TAKE 1 TABLET BY MOUTH DAILY BEFORE BREAKFAST. 90 tablet 3  ? losartan (COZAAR) 100 MG tablet TAKE 1 TABLET BY MOUTH EVERY DAY 90 tablet 3  ? ondansetron (ZOFRAN) 8 MG tablet Take 1 tablet (8 mg total) by mouth every 8 (eight) hours as needed for nausea or vomiting. 60 tablet 2  ? oxyCODONE (ROXICODONE) 5 MG immediate release tablet Take 1 tablet (5 mg total) by mouth every 4 (four) hours as needed for severe pain. (Patient not taking: Reported on 04/03/2021) 15 tablet 0  ? pantoprazole (PROTONIX) 40 MG tablet Take 1 tablet (40 mg total) by mouth daily. 7 tablet 0  ? prochlorperazine (COMPAZINE) 10 MG tablet Take 1 tablet (10 mg total) by mouth every  6 (six) hours as needed for nausea or vomiting. 60 tablet 2  ? traZODone (DESYREL) 50 MG tablet TAKE HALF TO 1 TABLET BY MOUTH AT BEDTIME AS NEEDED FOR SLEEP 90 tablet 1  ? VITAMIN D, CHOLECALCIFEROL, PO Take 2,000 Int'l Units by mouth daily.    ? ?No current facility-administered medications for this visit.  ? ? ?REVIEW OF SYSTEMS:   ?Constitutional: ( - ) fevers, ( - )  chills , ( - ) night sweats ?Eyes: ( - ) blurriness of vision, ( - ) double vision, ( - ) watery eyes ?Ears, nose, mouth, throat, and face: ( - )  mucositis, ( - ) sore throat ?Respiratory: ( - ) cough, ( - ) dyspnea, ( - ) wheezes ?Cardiovascular: ( - ) palpitation, ( - ) chest discomfort, ( - ) lower extremity swelling ?Gastrointestinal:  ( - ) nausea, ( - ) heartburn, ( + ) change in bowel habits ?Skin: ( - ) abnormal skin rashes ?Lymphatics: ( - ) new lymphadenopathy, ( - ) easy bruising ?Neurological: ( - ) numbness, ( - ) tingling, ( - ) new weaknesses ?Behavioral/Psych: ( - ) mood change, ( - ) new changes  ?All other systems were reviewed with the patient and are negative. ? ?PHYSICAL EXAMINATION: ?ECOG PERFORMANCE STATUS: 1 - Symptomatic but completely ambulatory ? ?Vitals:  ? 04/03/21 1123  ?BP: (!) 144/75  ?Pulse: 85  ?Resp: 17  ?Temp: 98.1 ?F (36.7 ?C)  ?SpO2: 100%  ? ?Filed Weights  ? 04/03/21 1123  ?Weight: 180 lb 11.2 oz (82 kg)  ? ? ?GENERAL: Well-appearing middle-age Caucasian female, alert, no distress and comfortable ?SKIN: skin color, texture, turgor are normal, no rashes or significant lesions ?EYES: conjunctiva are pink and non-injected, sclera clear ?CHEST: mass over right clavicle, firm but decreasing in size  ?LUNGS: clear to auscultation and percussion with normal breathing effort ?HEART: regular rate & rhythm and no murmurs and no lower extremity edema ?Musculoskeletal: no cyanosis of digits and no clubbing  ?PSYCH: alert & oriented x 3, fluent speech ?NEURO: no focal motor/sensory deficits ? ?LABORATORY DATA:  ?I have  reviewed the data as listed ? ?  Latest Ref Rng & Units 04/03/2021  ? 10:59 AM 03/26/2021  ?  2:38 PM 03/20/2021  ? 11:56 AM  ?CBC  ?WBC 4.0 - 10.5 K/uL 3.1   11.4   10.9    ?Hemoglobin 12.0 - 15.0 g/dL 8.1

## 2021-04-03 NOTE — Progress Notes (Signed)
Ok to treat with creat 1.80 mg/dL per Dr Lorenso Courier ?

## 2021-04-08 ENCOUNTER — Other Ambulatory Visit: Payer: Self-pay | Admitting: Hematology and Oncology

## 2021-04-08 ENCOUNTER — Other Ambulatory Visit: Payer: Self-pay | Admitting: Adult Health

## 2021-04-08 DIAGNOSIS — F419 Anxiety disorder, unspecified: Secondary | ICD-10-CM

## 2021-04-08 DIAGNOSIS — I159 Secondary hypertension, unspecified: Secondary | ICD-10-CM

## 2021-04-10 ENCOUNTER — Other Ambulatory Visit: Payer: Self-pay | Admitting: Hematology and Oncology

## 2021-04-10 ENCOUNTER — Other Ambulatory Visit: Payer: Self-pay

## 2021-04-10 ENCOUNTER — Inpatient Hospital Stay: Payer: 59

## 2021-04-10 ENCOUNTER — Encounter: Payer: Self-pay | Admitting: Hematology and Oncology

## 2021-04-10 ENCOUNTER — Inpatient Hospital Stay: Payer: 59 | Attending: Physician Assistant

## 2021-04-10 VITALS — BP 155/74 | HR 90 | Temp 97.8°F | Resp 17 | Wt 182.2 lb

## 2021-04-10 DIAGNOSIS — Z79899 Other long term (current) drug therapy: Secondary | ICD-10-CM | POA: Diagnosis not present

## 2021-04-10 DIAGNOSIS — C9000 Multiple myeloma not having achieved remission: Secondary | ICD-10-CM | POA: Insufficient documentation

## 2021-04-10 DIAGNOSIS — Z5112 Encounter for antineoplastic immunotherapy: Secondary | ICD-10-CM | POA: Insufficient documentation

## 2021-04-10 DIAGNOSIS — D500 Iron deficiency anemia secondary to blood loss (chronic): Secondary | ICD-10-CM

## 2021-04-10 DIAGNOSIS — Z5111 Encounter for antineoplastic chemotherapy: Secondary | ICD-10-CM | POA: Insufficient documentation

## 2021-04-10 LAB — CBC WITH DIFFERENTIAL (CANCER CENTER ONLY)
Abs Immature Granulocytes: 0.02 10*3/uL (ref 0.00–0.07)
Basophils Absolute: 0 10*3/uL (ref 0.0–0.1)
Basophils Relative: 1 %
Eosinophils Absolute: 0.1 10*3/uL (ref 0.0–0.5)
Eosinophils Relative: 3 %
HCT: 22.8 % — ABNORMAL LOW (ref 36.0–46.0)
Hemoglobin: 7.6 g/dL — ABNORMAL LOW (ref 12.0–15.0)
Immature Granulocytes: 1 %
Lymphocytes Relative: 9 %
Lymphs Abs: 0.2 10*3/uL — ABNORMAL LOW (ref 0.7–4.0)
MCH: 31.3 pg (ref 26.0–34.0)
MCHC: 33.3 g/dL (ref 30.0–36.0)
MCV: 93.8 fL (ref 80.0–100.0)
Monocytes Absolute: 0.1 10*3/uL (ref 0.1–1.0)
Monocytes Relative: 6 %
Neutro Abs: 1.6 10*3/uL — ABNORMAL LOW (ref 1.7–7.7)
Neutrophils Relative %: 80 %
Platelet Count: 146 10*3/uL — ABNORMAL LOW (ref 150–400)
RBC: 2.43 MIL/uL — ABNORMAL LOW (ref 3.87–5.11)
RDW: 17.2 % — ABNORMAL HIGH (ref 11.5–15.5)
WBC Count: 2 10*3/uL — ABNORMAL LOW (ref 4.0–10.5)
nRBC: 0 % (ref 0.0–0.2)

## 2021-04-10 LAB — FERRITIN: Ferritin: 586 ng/mL — ABNORMAL HIGH (ref 11–307)

## 2021-04-10 LAB — LACTATE DEHYDROGENASE: LDH: 215 U/L — ABNORMAL HIGH (ref 98–192)

## 2021-04-10 LAB — CMP (CANCER CENTER ONLY)
ALT: 16 U/L (ref 0–44)
AST: 13 U/L — ABNORMAL LOW (ref 15–41)
Albumin: 4.1 g/dL (ref 3.5–5.0)
Alkaline Phosphatase: 49 U/L (ref 38–126)
Anion gap: 9 (ref 5–15)
BUN: 28 mg/dL — ABNORMAL HIGH (ref 8–23)
CO2: 23 mmol/L (ref 22–32)
Calcium: 8.9 mg/dL (ref 8.9–10.3)
Chloride: 109 mmol/L (ref 98–111)
Creatinine: 1.85 mg/dL — ABNORMAL HIGH (ref 0.44–1.00)
GFR, Estimated: 30 mL/min — ABNORMAL LOW (ref >60)
Glucose, Bld: 102 mg/dL — ABNORMAL HIGH (ref 70–99)
Potassium: 4 mmol/L (ref 3.5–5.1)
Sodium: 141 mmol/L (ref 135–145)
Total Bilirubin: 0.4 mg/dL (ref 0.3–1.2)
Total Protein: 6.4 g/dL — ABNORMAL LOW (ref 6.5–8.1)

## 2021-04-10 LAB — IRON AND IRON BINDING CAPACITY (CC-WL,HP ONLY)
Iron: 89 ug/dL (ref 28–170)
Saturation Ratios: 31 % (ref 10.4–31.8)
TIBC: 288 ug/dL (ref 250–450)
UIBC: 199 ug/dL (ref 148–442)

## 2021-04-10 MED ORDER — SODIUM CHLORIDE 0.9 % IV SOLN
300.0000 mg/m2 | Freq: Once | INTRAVENOUS | Status: AC
  Administered 2021-04-10: 600 mg via INTRAVENOUS
  Filled 2021-04-10: qty 30

## 2021-04-10 MED ORDER — BORTEZOMIB CHEMO SQ INJECTION 3.5 MG (2.5MG/ML)
1.5000 mg/m2 | Freq: Once | INTRAMUSCULAR | Status: AC
  Administered 2021-04-10: 3 mg via SUBCUTANEOUS
  Filled 2021-04-10: qty 1.2

## 2021-04-10 MED ORDER — SODIUM CHLORIDE 0.9 % IV SOLN: Freq: Once | INTRAVENOUS | Status: AC

## 2021-04-10 MED ORDER — PALONOSETRON HCL INJECTION 0.25 MG/5ML
0.2500 mg | Freq: Once | INTRAVENOUS | Status: AC
  Administered 2021-04-10: 0.25 mg via INTRAVENOUS
  Filled 2021-04-10: qty 5

## 2021-04-10 NOTE — Patient Instructions (Signed)
[facility]  Discharge Instructions: ?Thank you for choosing [facility] to provide your oncology and hematology care.  ? ?If you have a lab appointment with the [facility], please go directly to the [facility] and check in at the registration area. ?  ?Wear comfortable clothing and clothing appropriate for easy access to any Portacath or PICC line.  ? ?We strive to give you quality time with your provider. You may need to reschedule your appointment if you arrive late (15 or more minutes).  Arriving late affects you and other patients whose appointments are after yours.  Also, if you miss three or more appointments without notifying the office, you may be dismissed from the clinic at the provider?s discretion.    ?  ?For prescription refill requests, have your pharmacy contact our office and allow 72 hours for refills to be completed.   ? ?Today you received the following chemotherapy and/or immunotherapy agents: Velcade, Cytoxan ?  ?To help prevent nausea and vomiting after your treatment, we encourage you to take your nausea medication as directed. ? ?BELOW ARE SYMPTOMS THAT SHOULD BE REPORTED IMMEDIATELY: ?*FEVER GREATER THAN 100.4 F (38 ?C) OR HIGHER ?*CHILLS OR SWEATING ?*NAUSEA AND VOMITING THAT IS NOT CONTROLLED WITH YOUR NAUSEA MEDICATION ?*UNUSUAL SHORTNESS OF BREATH ?*UNUSUAL BRUISING OR BLEEDING ?*URINARY PROBLEMS (pain or burning when urinating, or frequent urination) ?*BOWEL PROBLEMS (unusual diarrhea, constipation, pain near the anus) ?TENDERNESS IN MOUTH AND THROAT WITH OR WITHOUT PRESENCE OF ULCERS (sore throat, sores in mouth, or a toothache) ?UNUSUAL RASH, SWELLING OR PAIN  ?UNUSUAL VAGINAL DISCHARGE OR ITCHING  ? ?Items with * indicate a potential emergency and should be followed up as soon as possible or go to the Emergency Department if any problems should occur. ? ?Please show the CHEMOTHERAPY ALERT CARD or IMMUNOTHERAPY ALERT CARD at check-in  to the Emergency Department and triage nurse. ? ?Should you have questions after your visit or need to cancel or reschedule your appointment, please contact [facility]  Dept: [phone]  and follow the prompts.  Office hours are 8:00 a.m. to 4:30 p.m. Monday - Friday. Please note that voicemails left after 4:00 p.m. may not be returned until the following business day.  We are closed weekends and major holidays. You have access to a nurse at all times for urgent questions. Please call the main number to the clinic Dept: [phone] and follow the prompts. ? ? ?For any non-urgent questions, you may also contact your provider using MyChart. We now offer e-Visits for anyone 25 and older to request care online for non-urgent symptoms. For details visit [email or web address]. ?  ?Also download the MyChart app! Go to the app store, search "MyChart", open the app, select [facility], and log in with your MyChart username and password. ? ?Due to Covid, a mask is required upon entering the hospital/clinic. If you do not have a mask, one will be given to you upon arrival. For doctor visits, patients may have 1 support person aged 51 or older with them. For treatment visits, patients cannot have anyone with them due to current Covid guidelines and our immunocompromised population.  ? ?

## 2021-04-10 NOTE — Progress Notes (Signed)
Okay to treat with Creat 1.85, Hgb 7.6, and ANC 1.6 per Dr. Lorenso Courier ?

## 2021-04-11 ENCOUNTER — Other Ambulatory Visit: Payer: Self-pay | Admitting: Physician Assistant

## 2021-04-12 ENCOUNTER — Encounter: Payer: Self-pay | Admitting: Hematology and Oncology

## 2021-04-17 ENCOUNTER — Other Ambulatory Visit: Payer: Self-pay

## 2021-04-17 ENCOUNTER — Inpatient Hospital Stay: Payer: 59

## 2021-04-17 ENCOUNTER — Inpatient Hospital Stay (HOSPITAL_BASED_OUTPATIENT_CLINIC_OR_DEPARTMENT_OTHER): Payer: 59 | Admitting: Physician Assistant

## 2021-04-17 VITALS — BP 130/73 | HR 84 | Temp 98.0°F | Resp 17 | Ht 67.0 in | Wt 181.2 lb

## 2021-04-17 DIAGNOSIS — C9000 Multiple myeloma not having achieved remission: Secondary | ICD-10-CM

## 2021-04-17 DIAGNOSIS — Z5111 Encounter for antineoplastic chemotherapy: Secondary | ICD-10-CM | POA: Diagnosis not present

## 2021-04-17 DIAGNOSIS — D649 Anemia, unspecified: Secondary | ICD-10-CM

## 2021-04-17 DIAGNOSIS — M792 Neuralgia and neuritis, unspecified: Secondary | ICD-10-CM | POA: Diagnosis not present

## 2021-04-17 DIAGNOSIS — M546 Pain in thoracic spine: Secondary | ICD-10-CM

## 2021-04-17 DIAGNOSIS — G8929 Other chronic pain: Secondary | ICD-10-CM

## 2021-04-17 LAB — CMP (CANCER CENTER ONLY)
ALT: 13 U/L (ref 0–44)
AST: 13 U/L — ABNORMAL LOW (ref 15–41)
Albumin: 4.4 g/dL (ref 3.5–5.0)
Alkaline Phosphatase: 52 U/L (ref 38–126)
Anion gap: 12 (ref 5–15)
BUN: 25 mg/dL — ABNORMAL HIGH (ref 8–23)
CO2: 21 mmol/L — ABNORMAL LOW (ref 22–32)
Calcium: 9.2 mg/dL (ref 8.9–10.3)
Chloride: 106 mmol/L (ref 98–111)
Creatinine: 2.01 mg/dL — ABNORMAL HIGH (ref 0.44–1.00)
GFR, Estimated: 27 mL/min — ABNORMAL LOW (ref >60)
Glucose, Bld: 236 mg/dL — ABNORMAL HIGH (ref 70–99)
Potassium: 4 mmol/L (ref 3.5–5.1)
Sodium: 139 mmol/L (ref 135–145)
Total Bilirubin: 0.4 mg/dL (ref 0.3–1.2)
Total Protein: 6.9 g/dL (ref 6.5–8.1)

## 2021-04-17 LAB — CBC WITH DIFFERENTIAL (CANCER CENTER ONLY)
Abs Immature Granulocytes: 0.09 10*3/uL — ABNORMAL HIGH (ref 0.00–0.07)
Basophils Absolute: 0 10*3/uL (ref 0.0–0.1)
Basophils Relative: 1 %
Eosinophils Absolute: 0 10*3/uL (ref 0.0–0.5)
Eosinophils Relative: 0 %
HCT: 24 % — ABNORMAL LOW (ref 36.0–46.0)
Hemoglobin: 8 g/dL — ABNORMAL LOW (ref 12.0–15.0)
Immature Granulocytes: 4 %
Lymphocytes Relative: 5 %
Lymphs Abs: 0.1 10*3/uL — ABNORMAL LOW (ref 0.7–4.0)
MCH: 31.9 pg (ref 26.0–34.0)
MCHC: 33.3 g/dL (ref 30.0–36.0)
MCV: 95.6 fL (ref 80.0–100.0)
Monocytes Absolute: 0 10*3/uL — ABNORMAL LOW (ref 0.1–1.0)
Monocytes Relative: 1 %
Neutro Abs: 2 10*3/uL (ref 1.7–7.7)
Neutrophils Relative %: 89 %
Platelet Count: 216 10*3/uL (ref 150–400)
RBC: 2.51 MIL/uL — ABNORMAL LOW (ref 3.87–5.11)
RDW: 17 % — ABNORMAL HIGH (ref 11.5–15.5)
WBC Count: 2.3 10*3/uL — ABNORMAL LOW (ref 4.0–10.5)
nRBC: 0 % (ref 0.0–0.2)

## 2021-04-17 LAB — LACTATE DEHYDROGENASE: LDH: 226 U/L — ABNORMAL HIGH (ref 98–192)

## 2021-04-17 MED ORDER — BORTEZOMIB CHEMO SQ INJECTION 3.5 MG (2.5MG/ML)
1.5000 mg/m2 | Freq: Once | INTRAMUSCULAR | Status: AC
  Administered 2021-04-17: 3 mg via SUBCUTANEOUS
  Filled 2021-04-17: qty 1.2

## 2021-04-17 MED ORDER — PALONOSETRON HCL INJECTION 0.25 MG/5ML
0.2500 mg | Freq: Once | INTRAVENOUS | Status: AC
  Administered 2021-04-17: 0.25 mg via INTRAVENOUS
  Filled 2021-04-17: qty 5

## 2021-04-17 MED ORDER — SODIUM CHLORIDE 0.9 % IV SOLN: Freq: Once | INTRAVENOUS | Status: AC

## 2021-04-17 MED ORDER — GABAPENTIN 300 MG PO CAPS: 300.0000 mg | ORAL_CAPSULE | Freq: Every day | ORAL | 3 refills | Status: DC

## 2021-04-17 MED ORDER — SODIUM CHLORIDE 0.9 % IV SOLN
300.0000 mg/m2 | Freq: Once | INTRAVENOUS | Status: AC
  Administered 2021-04-17: 600 mg via INTRAVENOUS
  Filled 2021-04-17: qty 30

## 2021-04-17 NOTE — Patient Instructions (Signed)
[facility]  Discharge Instructions: ?Thank you for choosing [facility] to provide your oncology and hematology care.  ? ?If you have a lab appointment with the [facility], please go directly to the [facility] and check in at the registration area. ?  ?Wear comfortable clothing and clothing appropriate for easy access to any Portacath or PICC line.  ? ?We strive to give you quality time with your provider. You may need to reschedule your appointment if you arrive late (15 or more minutes).  Arriving late affects you and other patients whose appointments are after yours.  Also, if you miss three or more appointments without notifying the office, you may be dismissed from the clinic at the provider?s discretion.    ?  ?For prescription refill requests, have your pharmacy contact our office and allow 72 hours for refills to be completed.   ? ?Today you received the following chemotherapy and/or immunotherapy agents: Velcade, Cytoxan ?  ?To help prevent nausea and vomiting after your treatment, we encourage you to take your nausea medication as directed. ? ?BELOW ARE SYMPTOMS THAT SHOULD BE REPORTED IMMEDIATELY: ?*FEVER GREATER THAN 100.4 F (38 ?C) OR HIGHER ?*CHILLS OR SWEATING ?*NAUSEA AND VOMITING THAT IS NOT CONTROLLED WITH YOUR NAUSEA MEDICATION ?*UNUSUAL SHORTNESS OF BREATH ?*UNUSUAL BRUISING OR BLEEDING ?*URINARY PROBLEMS (pain or burning when urinating, or frequent urination) ?*BOWEL PROBLEMS (unusual diarrhea, constipation, pain near the anus) ?TENDERNESS IN MOUTH AND THROAT WITH OR WITHOUT PRESENCE OF ULCERS (sore throat, sores in mouth, or a toothache) ?UNUSUAL RASH, SWELLING OR PAIN  ?UNUSUAL VAGINAL DISCHARGE OR ITCHING  ? ?Items with * indicate a potential emergency and should be followed up as soon as possible or go to the Emergency Department if any problems should occur. ? ?Please show the CHEMOTHERAPY ALERT CARD or IMMUNOTHERAPY ALERT CARD at check-in  to the Emergency Department and triage nurse. ? ?Should you have questions after your visit or need to cancel or reschedule your appointment, please contact [facility]  Dept: [phone]  and follow the prompts.  Office hours are 8:00 a.m. to 4:30 p.m. Monday - Friday. Please note that voicemails left after 4:00 p.m. may not be returned until the following business day.  We are closed weekends and major holidays. You have access to a nurse at all times for urgent questions. Please call the main number to the clinic Dept: [phone] and follow the prompts. ? ? ?For any non-urgent questions, you may also contact your provider using MyChart. We now offer e-Visits for anyone 5 and older to request care online for non-urgent symptoms. For details visit [email or web address]. ?  ?Also download the MyChart app! Go to the app store, search "MyChart", open the app, select [facility], and log in with your MyChart username and password. ? ?Due to Covid, a mask is required upon entering the hospital/clinic. If you do not have a mask, one will be given to you upon arrival. For doctor visits, patients may have 1 support person aged 73 or older with them. For treatment visits, patients cannot have anyone with them due to current Covid guidelines and our immunocompromised population.  ? ?

## 2021-04-17 NOTE — Progress Notes (Signed)
Per Murray Hodgkins, [place], ok to treat with scr 2.01 and hgb 8. ?

## 2021-04-17 NOTE — Progress Notes (Signed)
?[facility] ?Telephone:(336) [phone]   [phone] ? ?PROGRESS NOTE ? ?Patient Care Team: ?Jordan Peng, NP as PCP - General (Family Medicine) ?Jordan Slick, MD as Consulting Physician (Hematology and Oncology) ?Jordan Sine, MD as Consulting Physician (Dermatology) ? 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  Serological testing concerning for a plasma cell neoplasm, with UPEP showing 7.4 g of protein daily with markedly high lambda light chains ?-11/07/2020: Bone marrow biopsy performed, shows no evidence of multiple myeloma ?-11/23/2020: Biopsy of right clavicle mass confirmed plasmacytoma.  ?-11/27/2020: PET scan show intensely hypermetabolic large soft tissue masses arising from the LEFT and RIGHT ribs. Two large lesions in the LEFT chest wall ?and a single large lesion in the RIGHT chest wall.Hypermetabolic expansile solid masses arising from the medial RIGHT clavicle and RIGHT inferior pubic ischium ?01/09/2021: Cycle 1, Day 1 CyBorD ?02/06/2021: Cycle 2, Day 1 CyBorD ?03/06/2021: Cycle 3, Day 1 CyBorD ?04/03/2021: Cycle 4, Day 1 CyBorD  ? ?Interval History:  ?Jordan Gregory 66 y.o. 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 The patient was last seen on 04/03/2021. She is here for Cycle 4, Day 15 of CyBorD.  ? ?On exam today Jordan Gregory reports that her mid back pain has worsened that it leaves her in tears. She feels unsteady now that she needs to use a cane to help with ambulation. She now experiences neuropathic pain down her legs, right greater than left. She takes oxycodone as needed but causes a lot of constipaton. She takes magnesium citrate that improves her  constipation. She reports that she is easily fatigued but she continues to complete her daily activities on her own. She denies any appetite changes or weight loss. She denies nausea, vomiting or abdominal pain. She denies easy bruising or signs of active bleeding. She has no other complaints. Rest of the 10 point ROS is below.  ? ?MEDICAL HISTORY:  ?Past Medical History:  ?Diagnosis Date  ? Hypertension   ? Insomnia   ? Plasmacytoma ([place])   ? Thyroid disease   ? Vitamin D deficiency   ? ? ?SURGICAL HISTORY: ?Past Surgical History:  ?Procedure Laterality Date  ? MOHS SURGERY  2016  ? ? ?SOCIAL HISTORY: ?Social History  ? ?Socioeconomic History  ? Marital status: Married  ?  Spouse name: Not on file  ? Number of children: Not on file  ? Years of education: Not on file  ? Highest education level: Not on file  ?Occupational History  ? Not on file  ?Tobacco Use  ? Smoking status: Never  ? Smokeless tobacco: Never  ?Vaping Use  ? Vaping Use: Never used  ?Substance and Sexual Activity  ? Alcohol use: Yes  ?  Alcohol/week: 5.0 standard drinks  ?  Types: 5 Standard drinks or equivalent per week  ?  Comment: regular  ? Drug use: No  ? Sexual activity: Yes  ?  Birth control/protection: Post-menopausal  ?  Comment: 1st intercourse 60 yo-5 partners  ?Other Topics Concern  ? Not on file  ?Social History Narrative  ? She works at [employer].   ? Married - 23 years   ? No kids   ?   ? She likes to travel, read, cycle, be  outside.   ? ?Social Determinants of Health  ? ?Financial Resource Strain: Not on file  ?Food Insecurity: Not on file  ?Transportation Needs: No Transportation Needs  ? Lack of Transportation (Medical): No  ? Lack of Transportation (Non-Medical): No  ?Physical Activity: Not on file  ?Stress: Not on file  ?Social Connections: Not on file  ?Intimate Partner Violence: Not At Risk  ? Fear of Current or Ex-Partner: No  ? Emotionally Abused: No  ? Physically Abused: No  ? Sexually Abused: No  ? ? ?FAMILY  HISTORY: ?Family History  ?Problem Relation Age of Onset  ? Arthritis Mother   ? Hypertension Mother   ? Dementia Mother   ? Heart disease Father   ? Liver cancer Father   ? Asperger's syndrome Brother   ? Atrial fibrillation Brother   ? ? ?ALLERGIES:  is allergic to ramipril. ? ?MEDICATIONS:  ?Current Outpatient Medications  ?Medication Sig Dispense Refill  ? acyclovir (ZOVIRAX) 400 MG tablet TAKE 1 TABLET BY MOUTH TWICE A DAY 180 tablet 3  ? allopurinol (ZYLOPRIM) 300 MG tablet TAKE 1 TABLET BY MOUTH EVERY DAY 90 tablet 1  ? amLODipine (NORVASC) 10 MG tablet Take 10 mg by mouth daily.    ? calcium carbonate (OS-CAL) 600 MG TABS Take 600 mg by mouth daily.    ? citalopram (CELEXA) 10 MG tablet TAKE 1 TABLET BY MOUTH EVERY DAY 90 tablet 0  ? Dexamethasone 20 MG TABS Take 40 mg by mouth once a week. Once a week. 60 tablet 3  ? fluconazole (DIFLUCAN) 150 MG tablet Take 1 tablet (150 mg total) by mouth daily. 7 tablet 0  ? levothyroxine (SYNTHROID) 75 MCG tablet TAKE 1 TABLET BY MOUTH DAILY BEFORE BREAKFAST. 90 tablet 3  ? losartan (COZAAR) 100 MG tablet TAKE 1 TABLET BY MOUTH EVERY DAY 90 tablet 3  ? ondansetron (ZOFRAN) 8 MG tablet Take 1 tablet (8 mg total) by mouth every 8 (eight) hours as needed for nausea or vomiting. 60 tablet 2  ? pantoprazole (PROTONIX) 40 MG tablet Take 1 tablet (40 mg total) by mouth daily. 7 tablet 0  ? prochlorperazine (COMPAZINE) 10 MG tablet Take 1 tablet (10 mg total) by mouth every 6 (six) hours as needed for nausea or vomiting. 60 tablet 2  ? traZODone (DESYREL) 50 MG tablet TAKE HALF TO 1 TABLET BY MOUTH AT BEDTIME AS NEEDED FOR SLEEP (Patient taking differently: Having to take #2 at bedtime) 90 tablet 1  ? VITAMIN D, CHOLECALCIFEROL, PO Take 2,000 Int'l Units by mouth daily.    ? oxyCODONE (ROXICODONE) 5 MG immediate release tablet Take 1 tablet (5 mg total) by mouth every 4 (four) hours as needed for severe pain. (Patient not taking: Reported on 04/17/2021) 15 tablet 0  ? ?No  current facility-administered medications for this visit.  ? ? ?REVIEW OF SYSTEMS:   ?Constitutional: ( - ) fevers, ( - )  chills , ( - ) night sweats ?Eyes: ( - ) blurriness of vision, ( - ) double vision, ( - ) watery eyes ?Ears, nose, mouth, throat, and face: ( - ) mucositis, ( - ) sore throat ?Respiratory: ( - ) cough, ( - ) dyspnea, ( - ) wheezes ?Cardiovascular: ( - ) palpitation, ( - ) chest discomfort, ( - ) lower extremity swelling ?Gastrointestinal:  ( - ) nausea, ( - ) heartburn, ( + ) change in bowel habits ?Skin: ( - ) abnormal skin rashes ?Lymphatics: ( - ) new  lymphadenopathy, ( - ) easy bruising ?Neurological: ( - ) numbness, ( - ) tingling, ( - ) new weaknesses ?Behavioral/Psych: ( - ) mood change, ( - ) new changes  ?All other systems were reviewed with the patient and are negative. ? ?PHYSICAL EXAMINATION: ?ECOG PERFORMANCE STATUS: 1 - Symptomatic but completely ambulatory ? ?Vitals:  ? 04/17/21 1300  ?BP: 130/73  ?Pulse: 84  ?Resp: 17  ?Temp: 98 ?F (36.7 ?C)  ?SpO2: 100%  ? ?Filed Weights  ? 04/17/21 1300  ?Weight: 181 lb 3.2 oz (82.2 kg)  ? ? ?GENERAL: Well-appearing middle-age Caucasian female, alert, no distress and comfortable ?SKIN: skin color, texture, turgor are normal, no rashes or significant lesions ?EYES: conjunctiva are pink and non-injected, sclera clear ?CHEST: mass over right clavicle, firm but decreasing in size  ?LUNGS: clear to auscultation and percussion with normal breathing effort ?HEART: regular rate & rhythm and no murmurs and no lower extremity edema ?Musculoskeletal: no cyanosis of digits and no clubbing  ?PSYCH: alert & oriented x 3, fluent speech ?NEURO: no focal motor/sensory deficits ? ?LABORATORY DATA:  ?I have reviewed the data as listed ? ?  Latest Ref Rng & Units 04/17/2021  ? 12:35 PM 04/10/2021  ? 11:02 AM 04/03/2021  ? 10:59 AM  ?CBC  ?WBC 4.0 - 10.5 K/uL 2.3   2.0   3.1    ?Hemoglobin 12.0 - 15.0 g/dL 8.0   7.6   8.1    ?Hematocrit 36.0 - 46.0 % 24.0   22.8   23.9     ?Platelets 150 - 400 K/uL 216   146   154    ? ? ? ?  Latest Ref Rng & Units 04/17/2021  ? 12:35 PM 04/10/2021  ? 11:02 AM 04/03/2021  ? 10:59 AM  ?CMP  ?Glucose 70 - 99 mg/dL 236   102   101    ?BUN 8 - 23 mg/dL 25   28   21

## 2021-04-22 ENCOUNTER — Telehealth: Payer: Self-pay

## 2021-04-22 NOTE — Telephone Encounter (Signed)
Verified identity and reminded patient of her 9:00am-04/23/21 in-person appointment w/ Shona Simpson PA-C. I advised patient to arrive 29mn early for check-in. I left my extension [phone] case patient needs anything. ?

## 2021-04-22 NOTE — Progress Notes (Signed)
?Radiation Oncology         [phone] ?________________________________ ? ?Name: Jordan Gregory        MRN: [id number]  ?Date of Service: 04/23/2021 DOB: [birth year] ? ?LO:VFIEPPIR, Tommi Rumps, NP  Orson Slick, MD    ? ?REFERRING PHYSICIAN: Orson Slick, MD ? ? ?DIAGNOSIS: The primary encounter diagnosis was Multiple myeloma not having achieved remission ([place]). A diagnosis of Plasmacytoma not having achieved remission, unspecified plasmacytoma type [facility]) was also pertinent to this visit. ? ? ?HISTORY OF PRESENT ILLNESS: Jordan Gregory is a 66 y.o. female seen a few months ago at the request of Dr. Lorenso Courier for a diagnosis of  multifocal plasmacytomas. The patient presented with pain in the right clavicle originally in the spring 2022.  X-ray imaging at that time showed no acute bony abnormality but changes concerning for osteoarthritis and she continued to have pain and ultimately returned for evaluation in September 2022.  An MRI was performed of the lumbar stenotic spine and sternum on 10/15/2020.  A 3 cm mass in the right medial clavicle with bony destructive findings and underlying pathologic fracture with extraosseous extension was noted causing bulging of the upper pectoralis major slight posterior displacement of the right subclavian vein and her MRI of the lumbar spine showed degenerative changes without lesions or stenosis of the canal.  She underwent an ultrasound-guided biopsy on 11/23/2020 of the clavicle lesion and plasma cell neoplasm was noted on final pathology.  She did undergo a PET scan on 11/27/2020 that showed hypermetabolic soft tissue masses arising in the left chest wall measuring up to 10.2 cm emanating from the left fourth rib, a 7.5 cm expansile mass in the right seventh rib, and additional left clavicular mass, and right ischium mass with an SUV that was also hypermetabolic, this in the right ischium measured 3.6 cm.  She did undergo bone marrow biopsy that showed  hypercellular bone marrow with 1% plasma cells and several predominantly small lymphoid aggregates without findings concerning for myeloma.  Given this situation, she proceeded with a second opinion at [facility] and they recommended treating her diagnosis as Multiple Myeloma. She began systemic chemo on 01/09/21. She has disease by CT in March 2023 that involves T12 and due to her pain in this area, she's seen to consider palliative radiotherapy. ? ?  ?Original scans in 2022. ? ? ? ? ? ? ?PREVIOUS RADIATION THERAPY: No ? ? ?PAST MEDICAL HISTORY:  ?Past Medical History:  ?Diagnosis Date  ? Hypertension   ? Insomnia   ? Plasmacytoma ([place])   ? Thyroid disease   ? Vitamin D deficiency   ?   ? ? ?PAST SURGICAL HISTORY: ?Past Surgical History:  ?Procedure Laterality Date  ? MOHS SURGERY  2016  ? ? ? ?FAMILY HISTORY:  ?Family History  ?Problem Relation Age of Onset  ? Arthritis Mother   ? Hypertension Mother   ? Dementia Mother   ? Heart disease Father   ? Liver cancer Father   ? Asperger's syndrome Brother   ? Atrial fibrillation Brother   ? ? ? ?SOCIAL HISTORY:  reports that she has never smoked. She has never used smokeless tobacco. She reports current alcohol use of about 5.0 standard drinks per week. She reports that she does not use drugs. The patient is married and lives in [place]. She is recently retired. She has also recently navigated the passing of her parents and also is the surrogate decision maker and support system  for her adult brother with Asperger's Syndrome.  ? ? ?ALLERGIES: Ramipril ? ? ?MEDICATIONS:  ?Current Outpatient Medications  ?Medication Sig Dispense Refill  ? acyclovir (ZOVIRAX) 400 MG tablet TAKE 1 TABLET BY MOUTH TWICE A DAY 180 tablet 3  ? allopurinol (ZYLOPRIM) 300 MG tablet TAKE 1 TABLET BY MOUTH EVERY DAY 90 tablet 1  ? amLODipine (NORVASC) 10 MG tablet Take 10 mg by mouth daily.    ? calcium carbonate (OS-CAL) 600 MG TABS Take 600 mg by mouth daily.    ? citalopram (CELEXA) 10 MG tablet  TAKE 1 TABLET BY MOUTH EVERY DAY 90 tablet 0  ? Dexamethasone 20 MG TABS Take 40 mg by mouth once a week. Once a week. 60 tablet 3  ? fluconazole (DIFLUCAN) 150 MG tablet Take 1 tablet (150 mg total) by mouth daily. (Patient not taking: Reported on 04/23/2021) 7 tablet 0  ? gabapentin (NEURONTIN) 300 MG capsule Take 1 capsule (300 mg total) by mouth at bedtime. 30 capsule 3  ? levothyroxine (SYNTHROID) 75 MCG tablet TAKE 1 TABLET BY MOUTH DAILY BEFORE BREAKFAST. 90 tablet 3  ? losartan (COZAAR) 100 MG tablet TAKE 1 TABLET BY MOUTH EVERY DAY (Patient not taking: Reported on 04/23/2021) 90 tablet 3  ? ondansetron (ZOFRAN) 8 MG tablet Take 1 tablet (8 mg total) by mouth every 8 (eight) hours as needed for nausea or vomiting. 60 tablet 2  ? oxyCODONE (ROXICODONE) 5 MG immediate release tablet Take 1 tablet (5 mg total) by mouth every 4 (four) hours as needed for severe pain. (Patient not taking: Reported on 04/17/2021) 15 tablet 0  ? pantoprazole (PROTONIX) 40 MG tablet Take 1 tablet (40 mg total) by mouth daily. 7 tablet 0  ? prochlorperazine (COMPAZINE) 10 MG tablet Take 1 tablet (10 mg total) by mouth every 6 (six) hours as needed for nausea or vomiting. 60 tablet 2  ? traZODone (DESYREL) 50 MG tablet TAKE HALF TO 1 TABLET BY MOUTH AT BEDTIME AS NEEDED FOR SLEEP (Patient taking differently: Having to take #2 at bedtime) 90 tablet 1  ? VITAMIN D, CHOLECALCIFEROL, PO Take 2,000 Int'l Units by mouth daily.    ? ?No current facility-administered medications for this encounter.  ? ? ? ?REVIEW OF SYSTEMS: On review of systems, the patient reports that she is doing fair. Her function has been limited physically by her pain in her back. She describes pain in the low right posterior pelvis that radiates into her right posterior thigh as well as a separate pain that feels like a vice or a belt around her low abdomen and sometimes feels prickly. She has also struggled with constipation pretty consistently in the last few months,  and is using different approaches to try to correct this with miralax, magnesium citrate, senokot and is trying to find the right balance in these medications. As a result she is avoiding narcotic medication. No pain is noted in her right clavical, ribs, or either right or left chest wall. She feels like since chemotherapy started her chest wall fullness has improved, and while she still has some shortness of breath at times, she feels like this is the result of anemia rather than mass effect on her pleura from chest wall disease. No other complaints are verbalized.  ? ?  ? ?PHYSICAL EXAM:  ?Wt Readings from Last 3 Encounters:  ?04/23/21 177 lb 6 oz (80.5 kg)  ?04/17/21 181 lb 3.2 oz (82.2 kg)  ?04/10/21 182 lb 4 oz (82.7 kg)  ? ?  Temp Readings from Last 3 Encounters:  ?04/23/21 (!) 96.1 ?F (35.6 ?C) (Temporal)  ?04/17/21 98 ?F (36.7 ?C) (Temporal)  ?04/10/21 97.8 ?F (36.6 ?C) (Oral)  ? ?BP Readings from Last 3 Encounters:  ?04/23/21 126/78  ?04/17/21 130/73  ?04/10/21 (!) 155/74  ? ?Pulse Readings from Last 3 Encounters:  ?04/23/21 (!) 102  ?04/17/21 84  ?04/10/21 90  ? ?Pain Assessment ?Pain Score: 5  (Dorsalgia & LT leg pain)/10 ?In general this is a well appearing caucasian female in no acute distress. She's alert and oriented x4 and appropriate throughout the examination. Cardiopulmonary assessment is negative for acute distress and she exhibits normal effort.  ? ? ? ? ?ECOG = 2 ? ?0 - Asymptomatic (Fully active, able to carry on all predisease activities without restriction) ? ?1 - Symptomatic but completely ambulatory (Restricted in physically strenuous activity but ambulatory and able to carry out work of a light or sedentary nature. For example, light housework, office work) ? ?2 - Symptomatic, <50% in bed during the day (Ambulatory and capable of all self care but unable to carry out any work activities. Up and about more than 50% of waking hours) ? ?3 - Symptomatic, >50% in bed, but not bedbound (Capable  of only limited self-care, confined to bed or chair 50% or more of waking hours) ? ?4 - Bedbound (Completely disabled. Cannot carry on any self-care. Totally confined to bed or chair) ? ?5 - Death ? ? Eustace Pen MM

## 2021-04-23 ENCOUNTER — Encounter: Payer: Self-pay | Admitting: Radiation Oncology

## 2021-04-23 ENCOUNTER — Ambulatory Visit
Admission: RE | Admit: 2021-04-23 | Discharge: 2021-04-23 | Disposition: A | Discharge status: Home / Self Care | Payer: 59 | Source: Ambulatory Visit | Attending: Radiation Oncology | Admitting: Radiation Oncology

## 2021-04-23 ENCOUNTER — Other Ambulatory Visit: Payer: Self-pay

## 2021-04-23 VITALS — BP 126/78 | HR 102 | Temp 96.1°F | Resp 18 | Ht 67.0 in | Wt 177.4 lb

## 2021-04-23 DIAGNOSIS — Z79899 Other long term (current) drug therapy: Secondary | ICD-10-CM | POA: Insufficient documentation

## 2021-04-23 DIAGNOSIS — Z5112 Encounter for antineoplastic immunotherapy: Secondary | ICD-10-CM | POA: Diagnosis not present

## 2021-04-23 DIAGNOSIS — C9000 Multiple myeloma not having achieved remission: Secondary | ICD-10-CM

## 2021-04-23 DIAGNOSIS — G4700 Insomnia, unspecified: Secondary | ICD-10-CM | POA: Diagnosis not present

## 2021-04-23 DIAGNOSIS — C9030 Solitary plasmacytoma not having achieved remission: Secondary | ICD-10-CM

## 2021-04-23 DIAGNOSIS — E559 Vitamin D deficiency, unspecified: Secondary | ICD-10-CM | POA: Insufficient documentation

## 2021-04-23 DIAGNOSIS — M792 Neuralgia and neuritis, unspecified: Secondary | ICD-10-CM | POA: Insufficient documentation

## 2021-04-23 DIAGNOSIS — E079 Disorder of thyroid, unspecified: Secondary | ICD-10-CM | POA: Diagnosis not present

## 2021-04-23 DIAGNOSIS — Z5111 Encounter for antineoplastic chemotherapy: Secondary | ICD-10-CM | POA: Insufficient documentation

## 2021-04-23 DIAGNOSIS — Z510 Encounter for antineoplastic radiation therapy: Secondary | ICD-10-CM | POA: Insufficient documentation

## 2021-04-23 NOTE — Progress Notes (Addendum)
Follow-up-new appointment. I verified patient identity and began nursing interview. Patient reports some constipation, persistent dorsalgia and LT leg pain 5/10. No other issues reported at this time. ? ?Meaningful use complete. ?Postmenopausal- NO chances of pregnancy. ? ?BP 126/78 (BP Location: Left Arm, Patient Position: Sitting)   Pulse (!) 102   Temp (!) 96.1 ?F (35.6 ?C) (Temporal)   Resp 18   Ht '5\' 7"'$  (1.702 m)   Wt 177 lb 6 oz (80.5 kg)   LMP 03/04/2011   SpO2 100%   BMI 27.78 kg/m?  ? ?

## 2021-04-24 ENCOUNTER — Inpatient Hospital Stay: Payer: 59

## 2021-04-24 VITALS — BP 113/63 | HR 98 | Temp 98.4°F | Resp 17 | Wt 180.0 lb

## 2021-04-24 DIAGNOSIS — Z510 Encounter for antineoplastic radiation therapy: Secondary | ICD-10-CM | POA: Diagnosis not present

## 2021-04-24 DIAGNOSIS — C9000 Multiple myeloma not having achieved remission: Secondary | ICD-10-CM

## 2021-04-24 DIAGNOSIS — D649 Anemia, unspecified: Secondary | ICD-10-CM

## 2021-04-24 LAB — CMP (CANCER CENTER ONLY)
ALT: 11 U/L (ref 0–44)
AST: 13 U/L — ABNORMAL LOW (ref 15–41)
Albumin: 4.5 g/dL (ref 3.5–5.0)
Alkaline Phosphatase: 52 U/L (ref 38–126)
Anion gap: 13 (ref 5–15)
BUN: 27 mg/dL — ABNORMAL HIGH (ref 8–23)
CO2: 21 mmol/L — ABNORMAL LOW (ref 22–32)
Calcium: 9.2 mg/dL (ref 8.9–10.3)
Chloride: 105 mmol/L (ref 98–111)
Creatinine: 2.08 mg/dL — ABNORMAL HIGH (ref 0.44–1.00)
GFR, Estimated: 26 mL/min — ABNORMAL LOW (ref >60)
Glucose, Bld: 213 mg/dL — ABNORMAL HIGH (ref 70–99)
Potassium: 4 mmol/L (ref 3.5–5.1)
Sodium: 139 mmol/L (ref 135–145)
Total Bilirubin: 0.6 mg/dL (ref 0.3–1.2)
Total Protein: 6.8 g/dL (ref 6.5–8.1)

## 2021-04-24 LAB — CBC WITH DIFFERENTIAL (CANCER CENTER ONLY)
Abs Immature Granulocytes: 0.05 10*3/uL (ref 0.00–0.07)
Basophils Absolute: 0 10*3/uL (ref 0.0–0.1)
Basophils Relative: 1 %
Eosinophils Absolute: 0 10*3/uL (ref 0.0–0.5)
Eosinophils Relative: 0 %
HCT: 24.6 % — ABNORMAL LOW (ref 36.0–46.0)
Hemoglobin: 8.2 g/dL — ABNORMAL LOW (ref 12.0–15.0)
Immature Granulocytes: 1 %
Lymphocytes Relative: 3 %
Lymphs Abs: 0.1 10*3/uL — ABNORMAL LOW (ref 0.7–4.0)
MCH: 31.9 pg (ref 26.0–34.0)
MCHC: 33.3 g/dL (ref 30.0–36.0)
MCV: 95.7 fL (ref 80.0–100.0)
Monocytes Absolute: 0.1 10*3/uL (ref 0.1–1.0)
Monocytes Relative: 2 %
Neutro Abs: 4 10*3/uL (ref 1.7–7.7)
Neutrophils Relative %: 93 %
Platelet Count: 199 10*3/uL (ref 150–400)
RBC: 2.57 MIL/uL — ABNORMAL LOW (ref 3.87–5.11)
RDW: 15.9 % — ABNORMAL HIGH (ref 11.5–15.5)
WBC Count: 4.2 10*3/uL (ref 4.0–10.5)
nRBC: 0 % (ref 0.0–0.2)

## 2021-04-24 LAB — IRON AND IRON BINDING CAPACITY (CC-WL,HP ONLY)
Iron: 80 ug/dL (ref 28–170)
Saturation Ratios: 27 % (ref 10.4–31.8)
TIBC: 302 ug/dL (ref 250–450)
UIBC: 222 ug/dL (ref 148–442)

## 2021-04-24 LAB — FOLATE: Folate: 7.8 ng/mL (ref >5.9)

## 2021-04-24 LAB — FERRITIN: Ferritin: 408 ng/mL — ABNORMAL HIGH (ref 11–307)

## 2021-04-24 LAB — LACTATE DEHYDROGENASE: LDH: 203 U/L — ABNORMAL HIGH (ref 98–192)

## 2021-04-24 LAB — VITAMIN B12: Vitamin B-12: 157 pg/mL — ABNORMAL LOW (ref 180–914)

## 2021-04-24 MED ORDER — SODIUM CHLORIDE 0.9 % IV SOLN: Freq: Once | INTRAVENOUS | Status: AC

## 2021-04-24 MED ORDER — BORTEZOMIB CHEMO SQ INJECTION 3.5 MG (2.5MG/ML)
1.5000 mg/m2 | Freq: Once | INTRAMUSCULAR | Status: AC
  Administered 2021-04-24: 3 mg via SUBCUTANEOUS
  Filled 2021-04-24: qty 1.2

## 2021-04-24 MED ORDER — SODIUM CHLORIDE 0.9 % IV SOLN
300.0000 mg/m2 | Freq: Once | INTRAVENOUS | Status: AC
  Administered 2021-04-24: 600 mg via INTRAVENOUS
  Filled 2021-04-24: qty 30

## 2021-04-24 MED ORDER — PALONOSETRON HCL INJECTION 0.25 MG/5ML
0.2500 mg | Freq: Once | INTRAVENOUS | Status: AC
  Administered 2021-04-24: 0.25 mg via INTRAVENOUS

## 2021-04-24 NOTE — Progress Notes (Signed)
Ok to treat with creatinine of 2.08 per Dr. Lorenso Courier ? ?

## 2021-04-24 NOTE — Patient Instructions (Signed)
[facility]  Discharge Instructions: ?Thank you for choosing [facility] to provide your oncology and hematology care.  ? ?If you have a lab appointment with the [facility], please go directly to the [facility] and check in at the registration area. ?  ?Wear comfortable clothing and clothing appropriate for easy access to any Portacath or PICC line.  ? ?We strive to give you quality time with your provider. You may need to reschedule your appointment if you arrive late (15 or more minutes).  Arriving late affects you and other patients whose appointments are after yours.  Also, if you miss three or more appointments without notifying the office, you may be dismissed from the clinic at the provider?s discretion.    ?  ?For prescription refill requests, have your pharmacy contact our office and allow 72 hours for refills to be completed.   ? ?Today you received the following chemotherapy and/or immunotherapy agents: Velcade and Cytoxan    ?  ?To help prevent nausea and vomiting after your treatment, we encourage you to take your nausea medication as directed. ? ?BELOW ARE SYMPTOMS THAT SHOULD BE REPORTED IMMEDIATELY: ?*FEVER GREATER THAN 100.4 F (38 ?C) OR HIGHER ?*CHILLS OR SWEATING ?*NAUSEA AND VOMITING THAT IS NOT CONTROLLED WITH YOUR NAUSEA MEDICATION ?*UNUSUAL SHORTNESS OF BREATH ?*UNUSUAL BRUISING OR BLEEDING ?*URINARY PROBLEMS (pain or burning when urinating, or frequent urination) ?*BOWEL PROBLEMS (unusual diarrhea, constipation, pain near the anus) ?TENDERNESS IN MOUTH AND THROAT WITH OR WITHOUT PRESENCE OF ULCERS (sore throat, sores in mouth, or a toothache) ?UNUSUAL RASH, SWELLING OR PAIN  ?UNUSUAL VAGINAL DISCHARGE OR ITCHING  ? ?Items with * indicate a potential emergency and should be followed up as soon as possible or go to the Emergency Department if any problems should occur. ? ?Please show the CHEMOTHERAPY ALERT CARD or IMMUNOTHERAPY ALERT CARD at  check-in to the Emergency Department and triage nurse. ? ?Should you have questions after your visit or need to cancel or reschedule your appointment, please contact [facility]  Dept: [phone]  and follow the prompts.  Office hours are 8:00 a.m. to 4:30 p.m. Monday - Friday. Please note that voicemails left after 4:00 p.m. may not be returned until the following business day.  We are closed weekends and major holidays. You have access to a nurse at all times for urgent questions. Please call the main number to the clinic Dept: [phone] and follow the prompts. ? ? ?For any non-urgent questions, you may also contact your provider using MyChart. We now offer e-Visits for anyone 28 and older to request care online for non-urgent symptoms. For details visit [email or web address]. ?  ?Also download the MyChart app! Go to the app store, search "MyChart", open the app, select [facility], and log in with your MyChart username and password. ? ?Due to Covid, a mask is required upon entering the hospital/clinic. If you do not have a mask, one will be given to you upon arrival. For doctor visits, patients may have 1 support person aged 77 or older with them. For treatment visits, patients cannot have anyone with them due to current Covid guidelines and our immunocompromised population.  ? ?

## 2021-04-25 LAB — KAPPA/LAMBDA LIGHT CHAINS
Kappa free light chain: 8.3 mg/L (ref 3.3–19.4)
Kappa, lambda light chain ratio: 0.46 (ref 0.26–1.65)
Lambda free light chains: 17.9 mg/L (ref 5.7–26.3)

## 2021-04-26 LAB — MULTIPLE MYELOMA PANEL, SERUM
Albumin SerPl Elph-Mcnc: 4.2 g/dL (ref 2.9–4.4)
Albumin/Glob SerPl: 2 — ABNORMAL HIGH (ref 0.7–1.7)
Alpha 1: 0.2 g/dL (ref 0.0–0.4)
Alpha2 Glob SerPl Elph-Mcnc: 0.7 g/dL (ref 0.4–1.0)
B-Globulin SerPl Elph-Mcnc: 0.9 g/dL (ref 0.7–1.3)
Gamma Glob SerPl Elph-Mcnc: 0.5 g/dL (ref 0.4–1.8)
Globulin, Total: 2.2 g/dL (ref 2.2–3.9)
IgA: 14 mg/dL — ABNORMAL LOW (ref 87–352)
IgG (Immunoglobin G), Serum: 477 mg/dL — ABNORMAL LOW (ref 586–1602)
IgM (Immunoglobulin M), Srm: 15 mg/dL — ABNORMAL LOW (ref 26–217)
Total Protein ELP: 6.4 g/dL (ref 6.0–8.5)

## 2021-04-26 LAB — METHYLMALONIC ACID, SERUM: Methylmalonic Acid, Quantitative: 589 nmol/L — ABNORMAL HIGH (ref 0–378)

## 2021-04-29 ENCOUNTER — Ambulatory Visit
Admission: RE | Admit: 2021-04-29 | Discharge: 2021-04-29 | Disposition: A | Discharge status: Home / Self Care | Payer: 59 | Source: Ambulatory Visit | Attending: Radiation Oncology | Admitting: Radiation Oncology

## 2021-04-29 ENCOUNTER — Other Ambulatory Visit: Payer: Self-pay

## 2021-04-29 DIAGNOSIS — Z510 Encounter for antineoplastic radiation therapy: Secondary | ICD-10-CM | POA: Diagnosis not present

## 2021-04-29 LAB — RAD ONC ARIA SESSION SUMMARY
Course Elapsed Days: 0
Plan Fractions Treated to Date: 1
Plan Fractions Treated to Date: 1
Plan Prescribed Dose Per Fraction: 2.5 Gy
Plan Prescribed Dose Per Fraction: 2.5 Gy
Plan Total Fractions Prescribed: 10
Plan Total Fractions Prescribed: 10
Plan Total Prescribed Dose: 25 Gy
Plan Total Prescribed Dose: 25 Gy
Reference Point Dosage Given to Date: 2.5 Gy
Reference Point Dosage Given to Date: 2.5 Gy
Reference Point Session Dosage Given: 2.5 Gy
Reference Point Session Dosage Given: 2.5 Gy
Session Number: 1

## 2021-04-30 ENCOUNTER — Ambulatory Visit
Admission: RE | Admit: 2021-04-30 | Discharge: 2021-04-30 | Disposition: A | Discharge status: Home / Self Care | Payer: 59 | Source: Ambulatory Visit | Attending: Radiation Oncology | Admitting: Radiation Oncology

## 2021-04-30 ENCOUNTER — Other Ambulatory Visit: Payer: Self-pay

## 2021-04-30 DIAGNOSIS — Z510 Encounter for antineoplastic radiation therapy: Secondary | ICD-10-CM | POA: Diagnosis not present

## 2021-04-30 LAB — RAD ONC ARIA SESSION SUMMARY
Course Elapsed Days: 1
Plan Fractions Treated to Date: 2
Plan Fractions Treated to Date: 2
Plan Prescribed Dose Per Fraction: 2.5 Gy
Plan Prescribed Dose Per Fraction: 2.5 Gy
Plan Total Fractions Prescribed: 10
Plan Total Fractions Prescribed: 10
Plan Total Prescribed Dose: 25 Gy
Plan Total Prescribed Dose: 25 Gy
Reference Point Dosage Given to Date: 5 Gy
Reference Point Dosage Given to Date: 5 Gy
Reference Point Session Dosage Given: 2.5 Gy
Reference Point Session Dosage Given: 2.5 Gy
Session Number: 2

## 2021-05-01 ENCOUNTER — Other Ambulatory Visit: Payer: Self-pay

## 2021-05-01 ENCOUNTER — Inpatient Hospital Stay (HOSPITAL_BASED_OUTPATIENT_CLINIC_OR_DEPARTMENT_OTHER): Payer: 59 | Admitting: Hematology and Oncology

## 2021-05-01 ENCOUNTER — Ambulatory Visit
Admission: RE | Admit: 2021-05-01 | Discharge: 2021-05-01 | Disposition: A | Discharge status: Home / Self Care | Payer: 59 | Source: Ambulatory Visit | Attending: Radiation Oncology | Admitting: Radiation Oncology

## 2021-05-01 ENCOUNTER — Inpatient Hospital Stay: Payer: 59

## 2021-05-01 ENCOUNTER — Ambulatory Visit: Payer: 59 | Admitting: Radiation Oncology

## 2021-05-01 VITALS — BP 134/76 | HR 98 | Temp 97.8°F | Resp 18 | Wt 179.8 lb

## 2021-05-01 VITALS — BP 136/81 | HR 99 | Temp 97.5°F | Resp 20 | Wt 175.1 lb

## 2021-05-01 DIAGNOSIS — D649 Anemia, unspecified: Secondary | ICD-10-CM

## 2021-05-01 DIAGNOSIS — E538 Deficiency of other specified B group vitamins: Secondary | ICD-10-CM

## 2021-05-01 DIAGNOSIS — C9000 Multiple myeloma not having achieved remission: Secondary | ICD-10-CM

## 2021-05-01 DIAGNOSIS — Z510 Encounter for antineoplastic radiation therapy: Secondary | ICD-10-CM | POA: Diagnosis not present

## 2021-05-01 LAB — CMP (CANCER CENTER ONLY)
ALT: 9 U/L (ref 0–44)
AST: 12 U/L — ABNORMAL LOW (ref 15–41)
Albumin: 4.4 g/dL (ref 3.5–5.0)
Alkaline Phosphatase: 48 U/L (ref 38–126)
Anion gap: 9 (ref 5–15)
BUN: 23 mg/dL (ref 8–23)
CO2: 22 mmol/L (ref 22–32)
Calcium: 9.1 mg/dL (ref 8.9–10.3)
Chloride: 107 mmol/L (ref 98–111)
Creatinine: 2.02 mg/dL — ABNORMAL HIGH (ref 0.44–1.00)
GFR, Estimated: 27 mL/min — ABNORMAL LOW (ref >60)
Glucose, Bld: 170 mg/dL — ABNORMAL HIGH (ref 70–99)
Potassium: 3.9 mmol/L (ref 3.5–5.1)
Sodium: 138 mmol/L (ref 135–145)
Total Bilirubin: 0.7 mg/dL (ref 0.3–1.2)
Total Protein: 6.4 g/dL — ABNORMAL LOW (ref 6.5–8.1)

## 2021-05-01 LAB — RAD ONC ARIA SESSION SUMMARY
Course Elapsed Days: 2
Plan Fractions Treated to Date: 3
Plan Fractions Treated to Date: 3
Plan Prescribed Dose Per Fraction: 2.5 Gy
Plan Prescribed Dose Per Fraction: 2.5 Gy
Plan Total Fractions Prescribed: 10
Plan Total Fractions Prescribed: 10
Plan Total Prescribed Dose: 25 Gy
Plan Total Prescribed Dose: 25 Gy
Reference Point Dosage Given to Date: 7.5 Gy
Reference Point Dosage Given to Date: 7.5 Gy
Reference Point Session Dosage Given: 2.5 Gy
Reference Point Session Dosage Given: 2.5 Gy
Session Number: 3

## 2021-05-01 LAB — CBC WITH DIFFERENTIAL (CANCER CENTER ONLY)
Abs Immature Granulocytes: 0.02 10*3/uL (ref 0.00–0.07)
Basophils Absolute: 0 10*3/uL (ref 0.0–0.1)
Basophils Relative: 1 %
Eosinophils Absolute: 0 10*3/uL (ref 0.0–0.5)
Eosinophils Relative: 1 %
HCT: 24.3 % — ABNORMAL LOW (ref 36.0–46.0)
Hemoglobin: 8.5 g/dL — ABNORMAL LOW (ref 12.0–15.0)
Immature Granulocytes: 1 %
Lymphocytes Relative: 5 %
Lymphs Abs: 0.2 10*3/uL — ABNORMAL LOW (ref 0.7–4.0)
MCH: 33.9 pg (ref 26.0–34.0)
MCHC: 35 g/dL (ref 30.0–36.0)
MCV: 96.8 fL (ref 80.0–100.0)
Monocytes Absolute: 0.1 10*3/uL (ref 0.1–1.0)
Monocytes Relative: 4 %
Neutro Abs: 2.4 10*3/uL (ref 1.7–7.7)
Neutrophils Relative %: 88 %
Platelet Count: 145 10*3/uL — ABNORMAL LOW (ref 150–400)
RBC: 2.51 MIL/uL — ABNORMAL LOW (ref 3.87–5.11)
RDW: 14.5 % (ref 11.5–15.5)
WBC Count: 2.8 10*3/uL — ABNORMAL LOW (ref 4.0–10.5)
nRBC: 0 % (ref 0.0–0.2)

## 2021-05-01 LAB — LACTATE DEHYDROGENASE: LDH: 259 U/L — ABNORMAL HIGH (ref 98–192)

## 2021-05-01 MED ORDER — PALONOSETRON HCL INJECTION 0.25 MG/5ML
0.2500 mg | Freq: Once | INTRAVENOUS | Status: AC
  Administered 2021-05-01: 0.25 mg via INTRAVENOUS
  Filled 2021-05-01: qty 5

## 2021-05-01 MED ORDER — CYANOCOBALAMIN 1000 MCG/ML IJ SOLN
1000.0000 ug | Freq: Once | INTRAMUSCULAR | Status: AC
  Administered 2021-05-01: 1000 ug via INTRAMUSCULAR
  Filled 2021-05-01: qty 1

## 2021-05-01 MED ORDER — BORTEZOMIB CHEMO SQ INJECTION 3.5 MG (2.5MG/ML)
1.5000 mg/m2 | Freq: Once | INTRAMUSCULAR | Status: AC
  Administered 2021-05-01: 3 mg via SUBCUTANEOUS
  Filled 2021-05-01: qty 1.2

## 2021-05-01 MED ORDER — SODIUM CHLORIDE 0.9 % IV SOLN: Freq: Once | INTRAVENOUS | Status: AC

## 2021-05-01 MED ORDER — SODIUM CHLORIDE 0.9 % IV SOLN
300.0000 mg/m2 | Freq: Once | INTRAVENOUS | Status: AC
  Administered 2021-05-01: 600 mg via INTRAVENOUS
  Filled 2021-05-01: qty 30

## 2021-05-01 NOTE — Progress Notes (Signed)
?[facility] ?[phone]   [phone] ? ?PROGRESS NOTE ? ?Patient Care Team: ?Dorothyann Peng, NP as PCP - General (Family Medicine) ?Orson Slick, MD as Consulting Physician (Hematology and Oncology) ?Harriett Sine, MD as Consulting Physician (Dermatology) ? ?Hematological/Oncological History ?# Plasmacytomas without Multiple Myeloma ?-10/15/2020: MRI showed pathologic fracture the medial clavicle associated with a 5.1 x 2.7 x 4.1 cm mass  ?-10/23/2020: Establish care with diagnostic clinic at [facility].  Serological testing concerning for a plasma cell neoplasm, with UPEP showing 7.4 g of protein daily with markedly high lambda light chains ?-11/07/2020: Bone marrow biopsy performed, shows no evidence of multiple myeloma ?-11/23/2020: Biopsy of right clavicle mass confirmed plasmacytoma.  ?-11/27/2020: PET scan show intensely hypermetabolic large soft tissue masses arising from the LEFT and RIGHT ribs. Two large lesions in the LEFT chest wall ?and a single large lesion in the RIGHT chest wall.Hypermetabolic expansile solid masses arising from the medial RIGHT clavicle and RIGHT inferior pubic ischium ?01/09/2021: Cycle 1, Day 1 CyBorD ?02/06/2021: Cycle 2, Day 1 CyBorD ?03/06/2021: Cycle 3, Day 1 CyBorD ?04/03/2021: Cycle 4, Day 1 CyBorD  ?05/01/2021: Cycle 5, Day 1 CyBorD  ? ?Interval History:  ?Jordan Gregory 66 y.o. 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 The patient was last seen on 04/17/2021. She is here for Cycle 5, Day 1 of CyBorD.  ? ?On exam today Ms. Chew reports she is receiving 10 fractions of radiation for palliative radiation to the spine.  She reports her energy levels have been poor.  She ranks it as a 3 out of 10.  She notes that her legs have been weak and her back has been hurting.  She also notes that she has been having some issues with constipation,  which she notes is connected to her pain medication.  She is doing her best to take senna docusate and laxatives to keep her bowels moving.  She continues to take gabapentin 300 mg nightly to try to help with some of the pain.  She reports that she is tolerating treatment well otherwise.  She denies any appetite changes or weight loss. She denies nausea, vomiting or abdominal pain. She denies easy bruising or signs of active bleeding. She has no other complaints. Rest of the 10 point ROS is below.  ? ?MEDICAL HISTORY:  ?Past Medical History:  ?Diagnosis Date  ? Hypertension   ? Insomnia   ? Plasmacytoma ([place])   ? Thyroid disease   ? Vitamin D deficiency   ? ? ?SURGICAL HISTORY: ?Past Surgical History:  ?Procedure Laterality Date  ? MOHS SURGERY  2016  ? ? ?SOCIAL HISTORY: ?Social History  ? ?Socioeconomic History  ? Marital status: Married  ?  Spouse name: Not on file  ? Number of children: Not on file  ? Years of education: Not on file  ? Highest education level: Not on file  ?Occupational History  ? Not on file  ?Tobacco Use  ? Smoking status: Never  ? Smokeless tobacco: Never  ?Vaping Use  ? Vaping Use: Never used  ?Substance and Sexual Activity  ? Alcohol use: Yes  ?  Alcohol/week: 5.0 standard drinks  ?  Types: 5 Standard drinks or equivalent per week  ?  Comment: regular  ? Drug use: No  ? Sexual activity: Yes  ?  Birth control/protection: Post-menopausal  ?  Comment: 1st intercourse 60 yo-5 partners  ?  Other Topics Concern  ? Not on file  ?Social History Narrative  ? She works at [employer].   ? Married - 23 years   ? No kids   ?   ? She likes to travel, read, cycle, be outside.   ? ?Social Determinants of Health  ? ?Financial Resource Strain: Not on file  ?Food Insecurity: Not on file  ?Transportation Needs: No Transportation Needs  ? Lack of Transportation (Medical): No  ? Lack of Transportation (Non-Medical): No  ?Physical Activity: Not on file  ?Stress: Not on file  ?Social Connections: Not on file   ?Intimate Partner Violence: Not At Risk  ? Fear of Current or Ex-Partner: No  ? Emotionally Abused: No  ? Physically Abused: No  ? Sexually Abused: No  ? ? ?FAMILY HISTORY: ?Family History  ?Problem Relation Age of Onset  ? Arthritis Mother   ? Hypertension Mother   ? Dementia Mother   ? Heart disease Father   ? Liver cancer Father   ? Asperger's syndrome Brother   ? Atrial fibrillation Brother   ? ? ?ALLERGIES:  is allergic to ramipril. ? ?MEDICATIONS:  ?Current Outpatient Medications  ?Medication Sig Dispense Refill  ? acyclovir (ZOVIRAX) 400 MG tablet TAKE 1 TABLET BY MOUTH TWICE A DAY 180 tablet 3  ? allopurinol (ZYLOPRIM) 300 MG tablet TAKE 1 TABLET BY MOUTH EVERY DAY 90 tablet 1  ? amLODipine (NORVASC) 10 MG tablet Take 10 mg by mouth daily.    ? calcium carbonate (OS-CAL) 600 MG TABS Take 600 mg by mouth daily.    ? citalopram (CELEXA) 10 MG tablet TAKE 1 TABLET BY MOUTH EVERY DAY 90 tablet 0  ? Dexamethasone 20 MG TABS Take 40 mg by mouth once a week. Once a week. 60 tablet 3  ? gabapentin (NEURONTIN) 300 MG capsule Take 1 capsule (300 mg total) by mouth at bedtime. 30 capsule 3  ? levothyroxine (SYNTHROID) 75 MCG tablet TAKE 1 TABLET BY MOUTH DAILY BEFORE BREAKFAST. 90 tablet 3  ? ondansetron (ZOFRAN) 8 MG tablet Take 1 tablet (8 mg total) by mouth every 8 (eight) hours as needed for nausea or vomiting. 60 tablet 2  ? oxyCODONE (ROXICODONE) 5 MG immediate release tablet Take 1 tablet (5 mg total) by mouth every 4 (four) hours as needed for severe pain. (Patient not taking: Reported on 04/17/2021) 15 tablet 0  ? pantoprazole (PROTONIX) 40 MG tablet Take 1 tablet (40 mg total) by mouth daily. 7 tablet 0  ? prochlorperazine (COMPAZINE) 10 MG tablet Take 1 tablet (10 mg total) by mouth every 6 (six) hours as needed for nausea or vomiting. 60 tablet 2  ? traZODone (DESYREL) 50 MG tablet TAKE HALF TO 1 TABLET BY MOUTH AT BEDTIME AS NEEDED FOR SLEEP (Patient taking differently: Having to take #2 at bedtime) 90  tablet 1  ? VITAMIN D, CHOLECALCIFEROL, PO Take 2,000 Int'l Units by mouth daily.    ? ?No current facility-administered medications for this visit.  ? ? ?REVIEW OF SYSTEMS:   ?Constitutional: ( - ) fevers, ( - )  chills , ( - ) night sweats ?Eyes: ( - ) blurriness of vision, ( - ) double vision, ( - ) watery eyes ?Ears, nose, mouth, throat, and face: ( - ) mucositis, ( - ) sore throat ?Respiratory: ( - ) cough, ( - ) dyspnea, ( - ) wheezes ?Cardiovascular: ( - ) palpitation, ( - ) chest discomfort, ( - ) lower extremity swelling ?  Gastrointestinal:  ( - ) nausea, ( - ) heartburn, ( + ) change in bowel habits ?Skin: ( - ) abnormal skin rashes ?Lymphatics: ( - ) new lymphadenopathy, ( - ) easy bruising ?Neurological: ( - ) numbness, ( - ) tingling, ( - ) new weaknesses ?Behavioral/Psych: ( - ) mood change, ( - ) new changes  ?All other systems were reviewed with the patient and are negative. ? ?PHYSICAL EXAMINATION: ?ECOG PERFORMANCE STATUS: 1 - Symptomatic but completely ambulatory ? ?Vitals:  ? 05/01/21 1205  ?BP: 136/81  ?Pulse: 99  ?Resp: 20  ?Temp: (!) 97.5 ?F (36.4 ?C)  ?SpO2: 100%  ? ?Filed Weights  ? 05/01/21 1205  ?Weight: 175 lb 1.6 oz (79.4 kg)  ? ? ?GENERAL: Well-appearing middle-age Caucasian female, alert, no distress and comfortable ?SKIN: skin color, texture, turgor are normal, no rashes or significant lesions ?EYES: conjunctiva are pink and non-injected, sclera clear ?CHEST: mass over right clavicle, firm but decreasing in size  ?LUNGS: clear to auscultation and percussion with normal breathing effort ?HEART: regular rate & rhythm and no murmurs and no lower extremity edema ?Musculoskeletal: no cyanosis of digits and no clubbing  ?PSYCH: alert & oriented x 3, fluent speech ?NEURO: no focal motor/sensory deficits ? ?LABORATORY DATA:  ?I have reviewed the data as listed ? ?  Latest Ref Rng & Units 05/01/2021  ? 11:14 AM 04/24/2021  ?  1:11 PM 04/17/2021  ? 12:35 PM  ?CBC  ?WBC 4.0 - 10.5 K/uL 2.8   4.2    2.3    ?Hemoglobin 12.0 - 15.0 g/dL 8.5   8.2   8.0    ?Hematocrit 36.0 - 46.0 % 24.3   24.6   24.0    ?Platelets 150 - 400 K/uL 145   199   216    ? ? ? ?  Latest Ref Rng & Units 05/01/2021  ? 11:14 AM 4/19

## 2021-05-01 NOTE — Patient Instructions (Signed)
Jordan Gregory  Discharge Instructions: ?Thank you for choosing [facility] to provide your oncology and hematology care.  ? ?If you have a lab appointment with the [facility], please go directly to the [facility] and check in at the registration area. ?  ?Wear comfortable clothing and clothing appropriate for easy access to any Portacath or PICC line.  ? ?We strive to give you quality time with your provider. You may need to reschedule your appointment if you arrive late (15 or more minutes).  Arriving late affects you and other patients whose appointments are after yours.  Also, if you miss three or more appointments without notifying the office, you may be dismissed from the clinic at the provider?s discretion.    ?  ?For prescription refill requests, have your pharmacy contact our office and allow 72 hours for refills to be completed.   ? ?Today you received the following chemotherapy and/or immunotherapy agents: Velcade/Cytoxan.    ?  ?To help prevent nausea and vomiting after your treatment, we encourage you to take your nausea medication as directed. ? ?BELOW ARE SYMPTOMS THAT SHOULD BE REPORTED IMMEDIATELY: ?*FEVER GREATER THAN 100.4 F (38 ?C) OR HIGHER ?*CHILLS OR SWEATING ?*NAUSEA AND VOMITING THAT IS NOT CONTROLLED WITH YOUR NAUSEA MEDICATION ?*UNUSUAL SHORTNESS OF BREATH ?*UNUSUAL BRUISING OR BLEEDING ?*URINARY PROBLEMS (pain or burning when urinating, or frequent urination) ?*BOWEL PROBLEMS (unusual diarrhea, constipation, pain near the anus) ?TENDERNESS IN MOUTH AND THROAT WITH OR WITHOUT PRESENCE OF ULCERS (sore throat, sores in mouth, or a toothache) ?UNUSUAL RASH, SWELLING OR PAIN  ?UNUSUAL VAGINAL DISCHARGE OR ITCHING  ? ?Items with * indicate a potential emergency and should be followed up as soon as possible or go to the Emergency Department if any problems should occur. ? ?Please show the CHEMOTHERAPY ALERT CARD or IMMUNOTHERAPY ALERT CARD at  check-in to the Emergency Department and triage nurse. ? ?Should you have questions after your visit or need to cancel or reschedule your appointment, please contact [facility]  Dept: [phone]  and follow the prompts.  Office hours are 8:00 a.m. to 4:30 p.m. Monday - Friday. Please note that voicemails left after 4:00 p.m. may not be returned until the following business day.  We are closed weekends and major holidays. You have access to a nurse at all times for urgent questions. Please call the main number to the clinic Dept: [phone] and follow the prompts. ? ? ?For any non-urgent questions, you may also contact your provider using MyChart. We now offer e-Visits for anyone 58 and older to request care online for non-urgent symptoms. For details visit [email or web address]. ?  ?Also download the MyChart app! Go to the app store, search "MyChart", open the app, select [facility], and log in with your MyChart username and password. ? ?Due to Covid, a mask is required upon entering the hospital/clinic. If you do not have a mask, one will be given to you upon arrival. For doctor visits, patients may have 1 support person aged 6 or older with them. For treatment visits, patients cannot have anyone with them due to current Covid guidelines and our immunocompromised population.  ? ?

## 2021-05-01 NOTE — Progress Notes (Signed)
Per Dr. Lorenso Courier ok to treat with a Scr of 2.02. ?

## 2021-05-02 ENCOUNTER — Other Ambulatory Visit: Payer: Self-pay

## 2021-05-02 ENCOUNTER — Ambulatory Visit
Admission: RE | Admit: 2021-05-02 | Discharge: 2021-05-02 | Disposition: A | Discharge status: Home / Self Care | Payer: 59 | Source: Ambulatory Visit | Attending: Radiation Oncology | Admitting: Radiation Oncology

## 2021-05-02 DIAGNOSIS — Z510 Encounter for antineoplastic radiation therapy: Secondary | ICD-10-CM | POA: Diagnosis not present

## 2021-05-02 LAB — RAD ONC ARIA SESSION SUMMARY

## 2021-05-03 ENCOUNTER — Other Ambulatory Visit: Payer: Self-pay

## 2021-05-03 ENCOUNTER — Ambulatory Visit
Admission: RE | Admit: 2021-05-03 | Discharge: 2021-05-03 | Disposition: A | Discharge status: Home / Self Care | Payer: 59 | Source: Ambulatory Visit | Attending: Radiation Oncology | Admitting: Radiation Oncology

## 2021-05-03 DIAGNOSIS — Z510 Encounter for antineoplastic radiation therapy: Secondary | ICD-10-CM | POA: Diagnosis not present

## 2021-05-03 LAB — RAD ONC ARIA SESSION SUMMARY
Course Elapsed Days: 4
Plan Fractions Treated to Date: 5
Plan Fractions Treated to Date: 5
Plan Prescribed Dose Per Fraction: 2.5 Gy
Plan Prescribed Dose Per Fraction: 2.5 Gy
Plan Total Fractions Prescribed: 10
Plan Total Fractions Prescribed: 10
Plan Total Prescribed Dose: 25 Gy
Plan Total Prescribed Dose: 25 Gy
Reference Point Dosage Given to Date: 12.5 Gy
Reference Point Dosage Given to Date: 12.5 Gy
Reference Point Session Dosage Given: 2.5 Gy
Reference Point Session Dosage Given: 2.5 Gy
Session Number: 5

## 2021-05-05 ENCOUNTER — Encounter: Payer: Self-pay | Admitting: Hematology and Oncology

## 2021-05-06 ENCOUNTER — Other Ambulatory Visit: Payer: Self-pay

## 2021-05-06 ENCOUNTER — Ambulatory Visit
Admission: RE | Admit: 2021-05-06 | Discharge: 2021-05-06 | Disposition: A | Discharge status: Home / Self Care | Payer: 59 | Source: Ambulatory Visit | Attending: Radiation Oncology | Admitting: Radiation Oncology

## 2021-05-06 DIAGNOSIS — Z79899 Other long term (current) drug therapy: Secondary | ICD-10-CM | POA: Diagnosis not present

## 2021-05-06 DIAGNOSIS — C9030 Solitary plasmacytoma not having achieved remission: Secondary | ICD-10-CM | POA: Diagnosis present

## 2021-05-06 DIAGNOSIS — Z5112 Encounter for antineoplastic immunotherapy: Secondary | ICD-10-CM | POA: Diagnosis present

## 2021-05-06 DIAGNOSIS — M792 Neuralgia and neuritis, unspecified: Secondary | ICD-10-CM | POA: Insufficient documentation

## 2021-05-06 DIAGNOSIS — Z5111 Encounter for antineoplastic chemotherapy: Secondary | ICD-10-CM | POA: Insufficient documentation

## 2021-05-06 DIAGNOSIS — Z510 Encounter for antineoplastic radiation therapy: Secondary | ICD-10-CM | POA: Diagnosis not present

## 2021-05-06 LAB — RAD ONC ARIA SESSION SUMMARY
Course Elapsed Days: 7
Plan Fractions Treated to Date: 6
Plan Fractions Treated to Date: 6
Plan Prescribed Dose Per Fraction: 2.5 Gy
Plan Prescribed Dose Per Fraction: 2.5 Gy
Plan Total Fractions Prescribed: 10
Plan Total Fractions Prescribed: 10
Plan Total Prescribed Dose: 25 Gy
Plan Total Prescribed Dose: 25 Gy
Reference Point Dosage Given to Date: 15 Gy
Reference Point Dosage Given to Date: 15 Gy
Reference Point Session Dosage Given: 2.5 Gy
Reference Point Session Dosage Given: 2.5 Gy
Session Number: 6

## 2021-05-07 ENCOUNTER — Other Ambulatory Visit: Payer: Self-pay

## 2021-05-07 ENCOUNTER — Ambulatory Visit
Admission: RE | Admit: 2021-05-07 | Discharge: 2021-05-07 | Disposition: A | Discharge status: Home / Self Care | Payer: 59 | Source: Ambulatory Visit | Attending: Radiation Oncology | Admitting: Radiation Oncology

## 2021-05-07 DIAGNOSIS — Z5111 Encounter for antineoplastic chemotherapy: Secondary | ICD-10-CM | POA: Diagnosis not present

## 2021-05-07 LAB — RAD ONC ARIA SESSION SUMMARY
Course Elapsed Days: 8
Plan Fractions Treated to Date: 7
Plan Fractions Treated to Date: 7
Plan Prescribed Dose Per Fraction: 2.5 Gy
Plan Prescribed Dose Per Fraction: 2.5 Gy
Plan Total Fractions Prescribed: 10
Plan Total Fractions Prescribed: 10
Plan Total Prescribed Dose: 25 Gy
Plan Total Prescribed Dose: 25 Gy
Reference Point Dosage Given to Date: 17.5 Gy
Reference Point Dosage Given to Date: 17.5 Gy
Reference Point Session Dosage Given: 2.5 Gy
Reference Point Session Dosage Given: 2.5 Gy
Session Number: 7

## 2021-05-08 ENCOUNTER — Other Ambulatory Visit: Payer: Self-pay

## 2021-05-08 ENCOUNTER — Inpatient Hospital Stay: Payer: 59 | Attending: Physician Assistant

## 2021-05-08 ENCOUNTER — Inpatient Hospital Stay: Payer: 59

## 2021-05-08 ENCOUNTER — Ambulatory Visit
Admission: RE | Admit: 2021-05-08 | Discharge: 2021-05-08 | Disposition: A | Discharge status: Home / Self Care | Payer: 59 | Source: Ambulatory Visit | Attending: Radiation Oncology | Admitting: Radiation Oncology

## 2021-05-08 VITALS — BP 131/75 | HR 98 | Temp 98.2°F | Resp 17 | Wt 178.5 lb

## 2021-05-08 DIAGNOSIS — Z510 Encounter for antineoplastic radiation therapy: Secondary | ICD-10-CM | POA: Insufficient documentation

## 2021-05-08 DIAGNOSIS — C9000 Multiple myeloma not having achieved remission: Secondary | ICD-10-CM

## 2021-05-08 DIAGNOSIS — Z5112 Encounter for antineoplastic immunotherapy: Secondary | ICD-10-CM | POA: Insufficient documentation

## 2021-05-08 DIAGNOSIS — C9030 Solitary plasmacytoma not having achieved remission: Secondary | ICD-10-CM | POA: Insufficient documentation

## 2021-05-08 DIAGNOSIS — Z79899 Other long term (current) drug therapy: Secondary | ICD-10-CM | POA: Insufficient documentation

## 2021-05-08 DIAGNOSIS — Z5111 Encounter for antineoplastic chemotherapy: Secondary | ICD-10-CM | POA: Insufficient documentation

## 2021-05-08 DIAGNOSIS — M792 Neuralgia and neuritis, unspecified: Secondary | ICD-10-CM | POA: Insufficient documentation

## 2021-05-08 DIAGNOSIS — E538 Deficiency of other specified B group vitamins: Secondary | ICD-10-CM

## 2021-05-08 LAB — CMP (CANCER CENTER ONLY)
ALT: 10 U/L (ref 0–44)
AST: 12 U/L — ABNORMAL LOW (ref 15–41)
Albumin: 4.3 g/dL (ref 3.5–5.0)
Alkaline Phosphatase: 49 U/L (ref 38–126)
Anion gap: 13 (ref 5–15)
BUN: 25 mg/dL — ABNORMAL HIGH (ref 8–23)
CO2: 18 mmol/L — ABNORMAL LOW (ref 22–32)
Calcium: 9.1 mg/dL (ref 8.9–10.3)
Chloride: 105 mmol/L (ref 98–111)
Creatinine: 1.78 mg/dL — ABNORMAL HIGH (ref 0.44–1.00)
GFR, Estimated: 31 mL/min — ABNORMAL LOW (ref >60)
Glucose, Bld: 331 mg/dL — ABNORMAL HIGH (ref 70–99)
Potassium: 4 mmol/L (ref 3.5–5.1)
Sodium: 136 mmol/L (ref 135–145)
Total Bilirubin: 0.5 mg/dL (ref 0.3–1.2)
Total Protein: 6.5 g/dL (ref 6.5–8.1)

## 2021-05-08 LAB — CBC WITH DIFFERENTIAL (CANCER CENTER ONLY)
Abs Immature Granulocytes: 0.02 10*3/uL (ref 0.00–0.07)
Basophils Absolute: 0 10*3/uL (ref 0.0–0.1)
Basophils Relative: 0 %
Eosinophils Absolute: 0 10*3/uL (ref 0.0–0.5)
Eosinophils Relative: 0 %
HCT: 24.9 % — ABNORMAL LOW (ref 36.0–46.0)
Hemoglobin: 8.5 g/dL — ABNORMAL LOW (ref 12.0–15.0)
Immature Granulocytes: 1 %
Lymphocytes Relative: 3 %
Lymphs Abs: 0.1 10*3/uL — ABNORMAL LOW (ref 0.7–4.0)
MCH: 33.1 pg (ref 26.0–34.0)
MCHC: 34.1 g/dL (ref 30.0–36.0)
MCV: 96.9 fL (ref 80.0–100.0)
Monocytes Absolute: 0 10*3/uL — ABNORMAL LOW (ref 0.1–1.0)
Monocytes Relative: 1 %
Neutro Abs: 2.7 10*3/uL (ref 1.7–7.7)
Neutrophils Relative %: 95 %
Platelet Count: 176 10*3/uL (ref 150–400)
RBC: 2.57 MIL/uL — ABNORMAL LOW (ref 3.87–5.11)
RDW: 13.3 % (ref 11.5–15.5)
WBC Count: 2.8 10*3/uL — ABNORMAL LOW (ref 4.0–10.5)
nRBC: 0 % (ref 0.0–0.2)

## 2021-05-08 LAB — LACTATE DEHYDROGENASE: LDH: 194 U/L — ABNORMAL HIGH (ref 98–192)

## 2021-05-08 LAB — RAD ONC ARIA SESSION SUMMARY
Course Elapsed Days: 9
Plan Fractions Treated to Date: 8
Plan Fractions Treated to Date: 8
Plan Prescribed Dose Per Fraction: 2.5 Gy
Plan Prescribed Dose Per Fraction: 2.5 Gy
Plan Total Fractions Prescribed: 10
Plan Total Fractions Prescribed: 10
Plan Total Prescribed Dose: 25 Gy
Plan Total Prescribed Dose: 25 Gy
Reference Point Dosage Given to Date: 20 Gy
Reference Point Dosage Given to Date: 20 Gy
Reference Point Session Dosage Given: 2.5 Gy
Reference Point Session Dosage Given: 2.5 Gy
Session Number: 8

## 2021-05-08 MED ORDER — SODIUM CHLORIDE 0.9 % IV SOLN: Freq: Once | INTRAVENOUS | Status: AC

## 2021-05-08 MED ORDER — BORTEZOMIB CHEMO SQ INJECTION 3.5 MG (2.5MG/ML)
1.5000 mg/m2 | Freq: Once | INTRAMUSCULAR | Status: AC
  Administered 2021-05-08: 3 mg via SUBCUTANEOUS
  Filled 2021-05-08: qty 1.2

## 2021-05-08 MED ORDER — PALONOSETRON HCL INJECTION 0.25 MG/5ML
0.2500 mg | Freq: Once | INTRAVENOUS | Status: AC
  Administered 2021-05-08: 0.25 mg via INTRAVENOUS
  Filled 2021-05-08: qty 5

## 2021-05-08 MED ORDER — SODIUM CHLORIDE 0.9 % IV SOLN
300.0000 mg/m2 | Freq: Once | INTRAVENOUS | Status: AC
  Administered 2021-05-08: 600 mg via INTRAVENOUS
  Filled 2021-05-08: qty 30

## 2021-05-08 MED ORDER — CYANOCOBALAMIN 1000 MCG/ML IJ SOLN
1000.0000 ug | Freq: Once | INTRAMUSCULAR | Status: AC
  Administered 2021-05-08: 1000 ug via INTRAMUSCULAR
  Filled 2021-05-08: qty 1

## 2021-05-08 NOTE — Patient Instructions (Signed)
[facility]  Discharge Instructions: ?Thank you for choosing [facility] to provide your oncology and hematology care.  ? ?If you have a lab appointment with the [facility], please go directly to the [facility] and check in at the registration area. ?  ?Wear comfortable clothing and clothing appropriate for easy access to any Portacath or PICC line.  ? ?We strive to give you quality time with your provider. You may need to reschedule your appointment if you arrive late (15 or more minutes).  Arriving late affects you and other patients whose appointments are after yours.  Also, if you miss three or more appointments without notifying the office, you may be dismissed from the clinic at the provider?s discretion.    ?  ?For prescription refill requests, have your pharmacy contact our office and allow 72 hours for refills to be completed.   ? ?Today you received the following chemotherapy and/or immunotherapy agents: Velcade/Cytoxan.    ?  ?To help prevent nausea and vomiting after your treatment, we encourage you to take your nausea medication as directed. ? ?BELOW ARE SYMPTOMS THAT SHOULD BE REPORTED IMMEDIATELY: ?*FEVER GREATER THAN 100.4 F (38 ?C) OR HIGHER ?*CHILLS OR SWEATING ?*NAUSEA AND VOMITING THAT IS NOT CONTROLLED WITH YOUR NAUSEA MEDICATION ?*UNUSUAL SHORTNESS OF BREATH ?*UNUSUAL BRUISING OR BLEEDING ?*URINARY PROBLEMS (pain or burning when urinating, or frequent urination) ?*BOWEL PROBLEMS (unusual diarrhea, constipation, pain near the anus) ?TENDERNESS IN MOUTH AND THROAT WITH OR WITHOUT PRESENCE OF ULCERS (sore throat, sores in mouth, or a toothache) ?UNUSUAL RASH, SWELLING OR PAIN  ?UNUSUAL VAGINAL DISCHARGE OR ITCHING  ? ?Items with * indicate a potential emergency and should be followed up as soon as possible or go to the Emergency Department if any problems should occur. ? ?Please show the CHEMOTHERAPY ALERT CARD or IMMUNOTHERAPY ALERT CARD at  check-in to the Emergency Department and triage nurse. ? ?Should you have questions after your visit or need to cancel or reschedule your appointment, please contact [facility]  Dept: [phone]  and follow the prompts.  Office hours are 8:00 a.m. to 4:30 p.m. Monday - Friday. Please note that voicemails left after 4:00 p.m. may not be returned until the following business day.  We are closed weekends and major holidays. You have access to a nurse at all times for urgent questions. Please call the main number to the clinic Dept: [phone] and follow the prompts. ? ? ?For any non-urgent questions, you may also contact your provider using MyChart. We now offer e-Visits for anyone 73 and older to request care online for non-urgent symptoms. For details visit [email or web address]. ?  ?Also download the MyChart app! Go to the app store, search "MyChart", open the app, select [facility], and log in with your MyChart username and password. ? ?Due to Covid, a mask is required upon entering the hospital/clinic. If you do not have a mask, one will be given to you upon arrival. For doctor visits, patients may have 1 support person aged 1 or older with them. For treatment visits, patients cannot have anyone with them due to current Covid guidelines and our immunocompromised population.  ? ?

## 2021-05-08 NOTE — Progress Notes (Signed)
Ok to proceed with elevated SCR per Dr.Dorsey ?

## 2021-05-09 ENCOUNTER — Ambulatory Visit
Admission: RE | Admit: 2021-05-09 | Discharge: 2021-05-09 | Disposition: A | Discharge status: Home / Self Care | Payer: 59 | Source: Ambulatory Visit | Attending: Radiation Oncology | Admitting: Radiation Oncology

## 2021-05-09 ENCOUNTER — Other Ambulatory Visit: Payer: Self-pay

## 2021-05-09 DIAGNOSIS — Z5111 Encounter for antineoplastic chemotherapy: Secondary | ICD-10-CM | POA: Diagnosis not present

## 2021-05-09 LAB — RAD ONC ARIA SESSION SUMMARY
Course Elapsed Days: 10
Plan Fractions Treated to Date: 9
Plan Fractions Treated to Date: 9
Plan Prescribed Dose Per Fraction: 2.5 Gy
Plan Prescribed Dose Per Fraction: 2.5 Gy
Plan Total Fractions Prescribed: 10
Plan Total Fractions Prescribed: 10
Plan Total Prescribed Dose: 25 Gy
Plan Total Prescribed Dose: 25 Gy
Reference Point Dosage Given to Date: 22.5 Gy
Reference Point Dosage Given to Date: 22.5 Gy
Reference Point Session Dosage Given: 2.5 Gy
Reference Point Session Dosage Given: 2.5 Gy
Session Number: 9

## 2021-05-10 ENCOUNTER — Ambulatory Visit: Payer: 59

## 2021-05-10 ENCOUNTER — Encounter: Payer: Self-pay | Admitting: Radiation Oncology

## 2021-05-10 ENCOUNTER — Ambulatory Visit
Admission: RE | Admit: 2021-05-10 | Discharge: 2021-05-10 | Disposition: A | Discharge status: Home / Self Care | Payer: 59 | Source: Ambulatory Visit | Attending: Radiation Oncology | Admitting: Radiation Oncology

## 2021-05-10 ENCOUNTER — Other Ambulatory Visit: Payer: Self-pay

## 2021-05-10 DIAGNOSIS — Z5111 Encounter for antineoplastic chemotherapy: Secondary | ICD-10-CM | POA: Diagnosis not present

## 2021-05-10 LAB — RAD ONC ARIA SESSION SUMMARY
Course Elapsed Days: 11
Plan Fractions Treated to Date: 10
Plan Fractions Treated to Date: 10
Plan Prescribed Dose Per Fraction: 2.5 Gy
Plan Prescribed Dose Per Fraction: 2.5 Gy
Plan Total Fractions Prescribed: 10
Plan Total Fractions Prescribed: 10
Plan Total Prescribed Dose: 25 Gy
Plan Total Prescribed Dose: 25 Gy
Reference Point Dosage Given to Date: 25 Gy
Reference Point Dosage Given to Date: 25 Gy
Reference Point Session Dosage Given: 2.5 Gy
Reference Point Session Dosage Given: 2.5 Gy
Session Number: 10

## 2021-05-13 ENCOUNTER — Ambulatory Visit: Payer: 59

## 2021-05-14 ENCOUNTER — Ambulatory Visit: Payer: 59

## 2021-05-15 ENCOUNTER — Inpatient Hospital Stay: Payer: 59

## 2021-05-15 ENCOUNTER — Other Ambulatory Visit: Payer: Self-pay

## 2021-05-15 ENCOUNTER — Inpatient Hospital Stay (HOSPITAL_BASED_OUTPATIENT_CLINIC_OR_DEPARTMENT_OTHER): Payer: 59 | Admitting: Hematology and Oncology

## 2021-05-15 VITALS — HR 94

## 2021-05-15 VITALS — BP 114/76 | HR 103 | Temp 98.5°F | Resp 17 | Wt 169.5 lb

## 2021-05-15 DIAGNOSIS — C9000 Multiple myeloma not having achieved remission: Secondary | ICD-10-CM | POA: Diagnosis not present

## 2021-05-15 DIAGNOSIS — E538 Deficiency of other specified B group vitamins: Secondary | ICD-10-CM

## 2021-05-15 DIAGNOSIS — C9030 Solitary plasmacytoma not having achieved remission: Secondary | ICD-10-CM

## 2021-05-15 DIAGNOSIS — M792 Neuralgia and neuritis, unspecified: Secondary | ICD-10-CM | POA: Diagnosis not present

## 2021-05-15 DIAGNOSIS — Z5111 Encounter for antineoplastic chemotherapy: Secondary | ICD-10-CM | POA: Diagnosis not present

## 2021-05-15 DIAGNOSIS — D649 Anemia, unspecified: Secondary | ICD-10-CM

## 2021-05-15 LAB — CBC WITH DIFFERENTIAL (CANCER CENTER ONLY)
Abs Immature Granulocytes: 0.01 10*3/uL (ref 0.00–0.07)
Basophils Absolute: 0 10*3/uL (ref 0.0–0.1)
Basophils Relative: 1 %
Eosinophils Absolute: 0.1 10*3/uL (ref 0.0–0.5)
Eosinophils Relative: 3 %
HCT: 25.2 % — ABNORMAL LOW (ref 36.0–46.0)
Hemoglobin: 9 g/dL — ABNORMAL LOW (ref 12.0–15.0)
Immature Granulocytes: 0 %
Lymphocytes Relative: 7 %
Lymphs Abs: 0.2 10*3/uL — ABNORMAL LOW (ref 0.7–4.0)
MCH: 33.7 pg (ref 26.0–34.0)
MCHC: 35.7 g/dL (ref 30.0–36.0)
MCV: 94.4 fL (ref 80.0–100.0)
Monocytes Absolute: 0.1 10*3/uL (ref 0.1–1.0)
Monocytes Relative: 5 %
Neutro Abs: 2.1 10*3/uL (ref 1.7–7.7)
Neutrophils Relative %: 84 %
Platelet Count: 160 10*3/uL (ref 150–400)
RBC: 2.67 MIL/uL — ABNORMAL LOW (ref 3.87–5.11)
RDW: 12.8 % (ref 11.5–15.5)
WBC Count: 2.5 10*3/uL — ABNORMAL LOW (ref 4.0–10.5)
nRBC: 0 % (ref 0.0–0.2)

## 2021-05-15 LAB — LACTATE DEHYDROGENASE: LDH: 164 U/L (ref 98–192)

## 2021-05-15 LAB — CMP (CANCER CENTER ONLY)
ALT: 10 U/L (ref 0–44)
AST: 11 U/L — ABNORMAL LOW (ref 15–41)
Albumin: 4.4 g/dL (ref 3.5–5.0)
Alkaline Phosphatase: 44 U/L (ref 38–126)
Anion gap: 9 (ref 5–15)
BUN: 21 mg/dL (ref 8–23)
CO2: 21 mmol/L — ABNORMAL LOW (ref 22–32)
Calcium: 9.1 mg/dL (ref 8.9–10.3)
Chloride: 107 mmol/L (ref 98–111)
Creatinine: 1.86 mg/dL — ABNORMAL HIGH (ref 0.44–1.00)
GFR, Estimated: 30 mL/min — ABNORMAL LOW (ref >60)
Glucose, Bld: 186 mg/dL — ABNORMAL HIGH (ref 70–99)
Potassium: 3.5 mmol/L (ref 3.5–5.1)
Sodium: 137 mmol/L (ref 135–145)
Total Bilirubin: 0.6 mg/dL (ref 0.3–1.2)
Total Protein: 6.8 g/dL (ref 6.5–8.1)

## 2021-05-15 MED ORDER — SODIUM CHLORIDE 0.9 % IV SOLN: Freq: Once | INTRAVENOUS | Status: AC

## 2021-05-15 MED ORDER — PALONOSETRON HCL INJECTION 0.25 MG/5ML
0.2500 mg | Freq: Once | INTRAVENOUS | Status: AC
  Administered 2021-05-15: 0.25 mg via INTRAVENOUS
  Filled 2021-05-15: qty 5

## 2021-05-15 MED ORDER — SODIUM CHLORIDE 0.9 % IV SOLN
300.0000 mg/m2 | Freq: Once | INTRAVENOUS | Status: AC
  Administered 2021-05-15: 600 mg via INTRAVENOUS
  Filled 2021-05-15: qty 30

## 2021-05-15 MED ORDER — GABAPENTIN 300 MG PO CAPS: 600.0000 mg | ORAL_CAPSULE | Freq: Three times a day (TID) | ORAL | 1 refills | Status: DC

## 2021-05-15 MED ORDER — BORTEZOMIB CHEMO SQ INJECTION 3.5 MG (2.5MG/ML)
1.5000 mg/m2 | Freq: Once | INTRAMUSCULAR | Status: AC
  Administered 2021-05-15: 3 mg via SUBCUTANEOUS
  Filled 2021-05-15: qty 1.2

## 2021-05-15 MED ORDER — CYANOCOBALAMIN 1000 MCG/ML IJ SOLN
1000.0000 ug | Freq: Once | INTRAMUSCULAR | Status: AC
  Administered 2021-05-15: 1000 ug via INTRAMUSCULAR
  Filled 2021-05-15: qty 1

## 2021-05-15 NOTE — Patient Instructions (Signed)
[facility]  Discharge Instructions: ?Thank you for choosing [facility] to provide your oncology and hematology care.  ? ?If you have a lab appointment with the [facility], please go directly to the [facility] and check in at the registration area. ?  ?Wear comfortable clothing and clothing appropriate for easy access to any Portacath or PICC line.  ? ?We strive to give you quality time with your provider. You may need to reschedule your appointment if you arrive late (15 or more minutes).  Arriving late affects you and other patients whose appointments are after yours.  Also, if you miss three or more appointments without notifying the office, you may be dismissed from the clinic at the provider?s discretion.    ?  ?For prescription refill requests, have your pharmacy contact our office and allow 72 hours for refills to be completed.   ? ?Today you received the following chemotherapy and/or immunotherapy agents: Cytoxan/Velcade    ?  ?To help prevent nausea and vomiting after your treatment, we encourage you to take your nausea medication as directed. ? ?BELOW ARE SYMPTOMS THAT SHOULD BE REPORTED IMMEDIATELY: ?*FEVER GREATER THAN 100.4 F (38 ?C) OR HIGHER ?*CHILLS OR SWEATING ?*NAUSEA AND VOMITING THAT IS NOT CONTROLLED WITH YOUR NAUSEA MEDICATION ?*UNUSUAL SHORTNESS OF BREATH ?*UNUSUAL BRUISING OR BLEEDING ?*URINARY PROBLEMS (pain or burning when urinating, or frequent urination) ?*BOWEL PROBLEMS (unusual diarrhea, constipation, pain near the anus) ?TENDERNESS IN MOUTH AND THROAT WITH OR WITHOUT PRESENCE OF ULCERS (sore throat, sores in mouth, or a toothache) ?UNUSUAL RASH, SWELLING OR PAIN  ?UNUSUAL VAGINAL DISCHARGE OR ITCHING  ? ?Items with * indicate a potential emergency and should be followed up as soon as possible or go to the Emergency Department if any problems should occur. ? ?Please show the CHEMOTHERAPY ALERT CARD or IMMUNOTHERAPY ALERT CARD at  check-in to the Emergency Department and triage nurse. ? ?Should you have questions after your visit or need to cancel or reschedule your appointment, please contact [facility]  Dept: [phone]  and follow the prompts.  Office hours are 8:00 a.m. to 4:30 p.m. Monday - Friday. Please note that voicemails left after 4:00 p.m. may not be returned until the following business day.  We are closed weekends and major holidays. You have access to a nurse at all times for urgent questions. Please call the main number to the clinic Dept: [phone] and follow the prompts. ? ? ?For any non-urgent questions, you may also contact your provider using MyChart. We now offer e-Visits for anyone 59 and older to request care online for non-urgent symptoms. For details visit [email or web address]. ?  ?Also download the MyChart app! Go to the app store, search "MyChart", open the app, select [facility], and log in with your MyChart username and password. ? ?Due to Covid, a mask is required upon entering the hospital/clinic. If you do not have a mask, one will be given to you upon arrival. For doctor visits, patients may have 1 support person aged 48 or older with them. For treatment visits, patients cannot have anyone with them due to current Covid guidelines and our immunocompromised population.  ? ?

## 2021-05-15 NOTE — Progress Notes (Signed)
Per Dr. Lorenso Courier OK to trt w/ SCR of 1.86 mg/dL and elevated HR today ?

## 2021-05-15 NOTE — Progress Notes (Signed)
?[facility] ?[phone]   [phone] ? ?PROGRESS NOTE ? ?Patient Care Team: ?Dorothyann Peng, NP as PCP - General (Family Medicine) ?Orson Slick, MD as Consulting Physician (Hematology and Oncology) ?Harriett Sine, MD as Consulting Physician (Dermatology) ? 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  Serological testing concerning for a plasma cell neoplasm, with UPEP showing 7.4 g of protein daily with markedly high lambda light chains ?-11/07/2020: Bone marrow biopsy performed, shows no evidence of multiple myeloma ?-11/23/2020: Biopsy of right clavicle mass confirmed plasmacytoma.  ?-11/27/2020: PET scan show intensely hypermetabolic large soft tissue masses arising from the LEFT and RIGHT ribs. Two large lesions in the LEFT chest wall ?and a single large lesion in the RIGHT chest wall.Hypermetabolic expansile solid masses arising from the medial RIGHT clavicle and RIGHT inferior pubic ischium ?01/09/2021: Cycle 1, Day 1 CyBorD ?02/06/2021: Cycle 2, Day 1 CyBorD ?03/06/2021: Cycle 3, Day 1 CyBorD ?04/03/2021: Cycle 4, Day 1 CyBorD  ?05/01/2021: Cycle 5, Day 1 CyBorD  ? ?Interval History:  ?Jordan Gregory 66 y.o. 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 The patient was last seen on 05/01/2021. She is here for Cycle 5, Day 15 of CyBorD.  ? ?On exam today Jordan Gregory reports she unfortunately had a tough time over the weekend.  She finished her last dose of radiation on Friday.  She does that she is struggling with continued pain and did have worsening fatigue.  She notes that she does feel better today as compared to yesterday and even had difficulty showering yesterday  due to her exhaustion.  She reports her appetite over the weekend was quite poor but is hopeful it is improving today as she has craving chicken Parmesan.  She notes that she is not having any nausea, vomiting or diarrhea but is taking stool softeners in order to keep her bowels loose.  She reports that she is doing her best to drink plenty of fluids.  She denies nausea, vomiting or abdominal pain. She denies easy bruising or signs of active bleeding. She has no other complaints. Rest of the 10 point ROS is below.  ? ?MEDICAL HISTORY:  ?Past Medical History:  ?Diagnosis Date  ? Hypertension   ? Insomnia   ? Plasmacytoma ([place])   ? Thyroid disease   ? Vitamin D deficiency   ? ? ?SURGICAL HISTORY: ?Past Surgical History:  ?Procedure Laterality Date  ? MOHS SURGERY  2016  ? ? ?SOCIAL HISTORY: ?Social History  ? ?Socioeconomic History  ? Marital status: Married  ?  Spouse name: Not on file  ? Number of children: Not on file  ? Years of education: Not on file  ? Highest education level: Not on file  ?Occupational History  ? Not on file  ?Tobacco Use  ? Smoking status: Never  ? Smokeless tobacco: Never  ?Vaping Use  ? Vaping Use: Never used  ?Substance and Sexual Activity  ? Alcohol use: Yes  ?  Alcohol/week: 5.0 standard drinks  ?  Types: 5 Standard drinks or equivalent per week  ?  Comment: regular  ? Drug use: No  ? Sexual activity: Yes  ?  Birth control/protection: Post-menopausal  ?  Comment: 1st intercourse 5 yo-5 partners  ?Other Topics  Concern  ? Not on file  ?Social History Narrative  ? She works at [employer].   ? Married - 23 years   ? No kids   ?   ? She likes to travel, read, cycle, be outside.   ? ?Social Determinants of Health  ? ?Financial Resource Strain: Not on file  ?Food Insecurity: Not on file  ?Transportation Needs: No Transportation Needs  ? Lack of Transportation (Medical): No  ? Lack of Transportation (Non-Medical): No  ?Physical Activity: Not on file  ?Stress: Not on file  ?Social  Connections: Not on file  ?Intimate Partner Violence: Not At Risk  ? Fear of Current or Ex-Partner: No  ? Emotionally Abused: No  ? Physically Abused: No  ? Sexually Abused: No  ? ? ?FAMILY HISTORY: ?Family History  ?Problem Relation Age of Onset  ? Arthritis Mother   ? Hypertension Mother   ? Dementia Mother   ? Heart disease Father   ? Liver cancer Father   ? Asperger's syndrome Brother   ? Atrial fibrillation Brother   ? ? ?ALLERGIES:  is allergic to ramipril. ? ?MEDICATIONS:  ?Current Outpatient Medications  ?Medication Sig Dispense Refill  ? gabapentin (NEURONTIN) 300 MG capsule Take 2 capsules (600 mg total) by mouth 3 (three) times daily. 180 capsule 1  ? oxyCODONE (ROXICODONE) 5 MG immediate release tablet Take 1 tablet (5 mg total) by mouth every 4 (four) hours as needed for severe pain. 15 tablet 0  ? acyclovir (ZOVIRAX) 400 MG tablet TAKE 1 TABLET BY MOUTH TWICE A DAY 180 tablet 3  ? allopurinol (ZYLOPRIM) 300 MG tablet TAKE 1 TABLET BY MOUTH EVERY DAY 90 tablet 1  ? amLODipine (NORVASC) 10 MG tablet Take 10 mg by mouth daily.    ? calcium carbonate (OS-CAL) 600 MG TABS Take 600 mg by mouth daily.    ? citalopram (CELEXA) 10 MG tablet TAKE 1 TABLET BY MOUTH EVERY DAY 90 tablet 0  ? Dexamethasone 20 MG TABS Take 40 mg by mouth once a week. Once a week. 60 tablet 3  ? levothyroxine (SYNTHROID) 75 MCG tablet TAKE 1 TABLET BY MOUTH DAILY BEFORE BREAKFAST. 90 tablet 3  ? ondansetron (ZOFRAN) 8 MG tablet Take 1 tablet (8 mg total) by mouth every 8 (eight) hours as needed for nausea or vomiting. 60 tablet 2  ? pantoprazole (PROTONIX) 40 MG tablet Take 1 tablet (40 mg total) by mouth daily. 7 tablet 0  ? prochlorperazine (COMPAZINE) 10 MG tablet Take 1 tablet (10 mg total) by mouth every 6 (six) hours as needed for nausea or vomiting. 60 tablet 2  ? traZODone (DESYREL) 50 MG tablet TAKE HALF TO 1 TABLET BY MOUTH AT BEDTIME AS NEEDED FOR SLEEP (Patient taking differently: Having to take #2 at bedtime) 90 tablet 1   ? VITAMIN D, CHOLECALCIFEROL, PO Take 2,000 Int'l Units by mouth daily.    ? ?No current facility-administered medications for this visit.  ? ?Facility-Administered Medications Ordered in Other Visits  ?Medication Dose Route Frequency Provider Last Rate Last Admin  ? cyclophosphamide (CYTOXAN) 600 mg in sodium chloride 0.9 % 250 mL chemo infusion  300 mg/m2 (Treatment Plan Recorded) Intravenous Once Ledell Peoples IV, MD 560 mL/hr at 05/15/21 1339 600 mg at 05/15/21 1339  ? ? ?REVIEW OF SYSTEMS:   ?Constitutional: ( - ) fevers, ( - )  chills , ( - ) night sweats ?Eyes: ( - ) blurriness of vision, ( - ) double  vision, ( - ) watery eyes ?Ears, nose, mouth, throat, and face: ( - ) mucositis, ( - ) sore throat ?Respiratory: ( - ) cough, ( - ) dyspnea, ( - ) wheezes ?Cardiovascular: ( - ) palpitation, ( - ) chest discomfort, ( - ) lower extremity swelling ?Gastrointestinal:  ( - ) nausea, ( - ) heartburn, ( + ) change in bowel habits ?Skin: ( - ) abnormal skin rashes ?Lymphatics: ( - ) new lymphadenopathy, ( - ) easy bruising ?Neurological: ( - ) numbness, ( - ) tingling, ( - ) new weaknesses ?Behavioral/Psych: ( - ) mood change, ( - ) new changes  ?All other systems were reviewed with the patient and are negative. ? ?PHYSICAL EXAMINATION: ?ECOG PERFORMANCE STATUS: 1 - Symptomatic but completely ambulatory ? ?Vitals:  ? 05/15/21 1202  ?BP: 114/76  ?Pulse: (!) 103  ?Resp: 17  ?Temp: 98.5 ?F (36.9 ?C)  ?SpO2: 97%  ? ?Filed Weights  ? 05/15/21 1202  ?Weight: 169 lb 8 oz (76.9 kg)  ? ? ?GENERAL: Well-appearing middle-age Caucasian female, alert, no distress and comfortable ?SKIN: skin color, texture, turgor are normal, no rashes or significant lesions ?EYES: conjunctiva are pink and non-injected, sclera clear ?CHEST: mass over right clavicle, firm but decreasing in size  ?LUNGS: clear to auscultation and percussion with normal breathing effort ?HEART: regular rate & rhythm and no murmurs and no lower extremity  edema ?Musculoskeletal: no cyanosis of digits and no clubbing  ?PSYCH: alert & oriented x 3, fluent speech ?NEURO: no focal motor/sensory deficits ? ?LABORATORY DATA:  ?I have reviewed the data as listed ? ?  Latest Ref Rng

## 2021-05-22 ENCOUNTER — Inpatient Hospital Stay: Payer: 59 | Admitting: Dietician

## 2021-05-22 ENCOUNTER — Other Ambulatory Visit: Payer: Self-pay

## 2021-05-22 ENCOUNTER — Inpatient Hospital Stay: Payer: 59

## 2021-05-22 VITALS — BP 116/67 | HR 100 | Temp 98.2°F | Resp 18 | Wt 168.8 lb

## 2021-05-22 DIAGNOSIS — C9000 Multiple myeloma not having achieved remission: Secondary | ICD-10-CM

## 2021-05-22 DIAGNOSIS — Z5111 Encounter for antineoplastic chemotherapy: Secondary | ICD-10-CM | POA: Diagnosis not present

## 2021-05-22 LAB — CBC WITH DIFFERENTIAL (CANCER CENTER ONLY)
Abs Immature Granulocytes: 0.03 10*3/uL (ref 0.00–0.07)
Basophils Absolute: 0 10*3/uL (ref 0.0–0.1)
Basophils Relative: 1 %
Eosinophils Absolute: 0 10*3/uL (ref 0.0–0.5)
Eosinophils Relative: 1 %
HCT: 24.4 % — ABNORMAL LOW (ref 36.0–46.0)
Hemoglobin: 8.8 g/dL — ABNORMAL LOW (ref 12.0–15.0)
Immature Granulocytes: 1 %
Lymphocytes Relative: 3 %
Lymphs Abs: 0.1 10*3/uL — ABNORMAL LOW (ref 0.7–4.0)
MCH: 33.8 pg (ref 26.0–34.0)
MCHC: 36.1 g/dL — ABNORMAL HIGH (ref 30.0–36.0)
MCV: 93.8 fL (ref 80.0–100.0)
Monocytes Absolute: 0.1 10*3/uL (ref 0.1–1.0)
Monocytes Relative: 3 %
Neutro Abs: 2.6 10*3/uL (ref 1.7–7.7)
Neutrophils Relative %: 91 %
Platelet Count: 142 10*3/uL — ABNORMAL LOW (ref 150–400)
RBC: 2.6 MIL/uL — ABNORMAL LOW (ref 3.87–5.11)
RDW: 12.3 % (ref 11.5–15.5)
WBC Count: 2.9 10*3/uL — ABNORMAL LOW (ref 4.0–10.5)
nRBC: 0 % (ref 0.0–0.2)

## 2021-05-22 LAB — CMP (CANCER CENTER ONLY)
ALT: 13 U/L (ref 0–44)
AST: 12 U/L — ABNORMAL LOW (ref 15–41)
Albumin: 4.4 g/dL (ref 3.5–5.0)
Alkaline Phosphatase: 47 U/L (ref 38–126)
Anion gap: 10 (ref 5–15)
BUN: 19 mg/dL (ref 8–23)
CO2: 21 mmol/L — ABNORMAL LOW (ref 22–32)
Calcium: 9.2 mg/dL (ref 8.9–10.3)
Chloride: 107 mmol/L (ref 98–111)
Creatinine: 1.9 mg/dL — ABNORMAL HIGH (ref 0.44–1.00)
GFR, Estimated: 29 mL/min — ABNORMAL LOW (ref >60)
Glucose, Bld: 175 mg/dL — ABNORMAL HIGH (ref 70–99)
Potassium: 3.5 mmol/L (ref 3.5–5.1)
Sodium: 138 mmol/L (ref 135–145)
Total Bilirubin: 0.5 mg/dL (ref 0.3–1.2)
Total Protein: 6.4 g/dL — ABNORMAL LOW (ref 6.5–8.1)

## 2021-05-22 LAB — LACTATE DEHYDROGENASE: LDH: 152 U/L (ref 98–192)

## 2021-05-22 MED ORDER — SODIUM CHLORIDE 0.9 % IV SOLN
300.0000 mg/m2 | Freq: Once | INTRAVENOUS | Status: AC
  Administered 2021-05-22: 600 mg via INTRAVENOUS
  Filled 2021-05-22: qty 30

## 2021-05-22 MED ORDER — SODIUM CHLORIDE 0.9 % IV SOLN: Freq: Once | INTRAVENOUS | Status: AC

## 2021-05-22 MED ORDER — BORTEZOMIB CHEMO SQ INJECTION 3.5 MG (2.5MG/ML)
1.5000 mg/m2 | Freq: Once | INTRAMUSCULAR | Status: AC
  Administered 2021-05-22: 3 mg via SUBCUTANEOUS
  Filled 2021-05-22: qty 1.2

## 2021-05-22 MED ORDER — PALONOSETRON HCL INJECTION 0.25 MG/5ML
0.2500 mg | Freq: Once | INTRAVENOUS | Status: AC
  Administered 2021-05-22: 0.25 mg via INTRAVENOUS
  Filled 2021-05-22: qty 5

## 2021-05-22 NOTE — Progress Notes (Signed)
Per Lorenso Courier MD, ok to treat with SCR 1.9 ?

## 2021-05-22 NOTE — Patient Instructions (Signed)
[facility]  Discharge Instructions: ?Thank you for choosing [facility] to provide your oncology and hematology care.  ? ?If you have a lab appointment with the [facility], please go directly to the [facility] and check in at the registration area. ?  ?Wear comfortable clothing and clothing appropriate for easy access to any Portacath or PICC line.  ? ?We strive to give you quality time with your provider. You may need to reschedule your appointment if you arrive late (15 or more minutes).  Arriving late affects you and other patients whose appointments are after yours.  Also, if you miss three or more appointments without notifying the office, you may be dismissed from the clinic at the provider?s discretion.    ?  ?For prescription refill requests, have your pharmacy contact our office and allow 72 hours for refills to be completed.   ? ?Today you received the following chemotherapy and/or immunotherapy agents: Velcade, Cytoxan.     ?  ?To help prevent nausea and vomiting after your treatment, we encourage you to take your nausea medication as directed. ? ?BELOW ARE SYMPTOMS THAT SHOULD BE REPORTED IMMEDIATELY: ?*FEVER GREATER THAN 100.4 F (38 ?C) OR HIGHER ?*CHILLS OR SWEATING ?*NAUSEA AND VOMITING THAT IS NOT CONTROLLED WITH YOUR NAUSEA MEDICATION ?*UNUSUAL SHORTNESS OF BREATH ?*UNUSUAL BRUISING OR BLEEDING ?*URINARY PROBLEMS (pain or burning when urinating, or frequent urination) ?*BOWEL PROBLEMS (unusual diarrhea, constipation, pain near the anus) ?TENDERNESS IN MOUTH AND THROAT WITH OR WITHOUT PRESENCE OF ULCERS (sore throat, sores in mouth, or a toothache) ?UNUSUAL RASH, SWELLING OR PAIN  ?UNUSUAL VAGINAL DISCHARGE OR ITCHING  ? ?Items with * indicate a potential emergency and should be followed up as soon as possible or go to the Emergency Department if any problems should occur. ? ?Please show the CHEMOTHERAPY ALERT CARD or IMMUNOTHERAPY ALERT CARD at  check-in to the Emergency Department and triage nurse. ? ?Should you have questions after your visit or need to cancel or reschedule your appointment, please contact [facility]  Dept: [phone]  and follow the prompts.  Office hours are 8:00 a.m. to 4:30 p.m. Monday - Friday. Please note that voicemails left after 4:00 p.m. may not be returned until the following business day.  We are closed weekends and major holidays. You have access to a nurse at all times for urgent questions. Please call the main number to the clinic Dept: [phone] and follow the prompts. ? ? ?For any non-urgent questions, you may also contact your provider using MyChart. We now offer e-Visits for anyone 59 and older to request care online for non-urgent symptoms. For details visit [email or web address]. ?  ?Also download the MyChart app! Go to the app store, search "MyChart", open the app, select [facility], and log in with your MyChart username and password. ? ?Due to Covid, a mask is required upon entering the hospital/clinic. If you do not have a mask, one will be given to you upon arrival. For doctor visits, patients may have 1 support person aged 49 or older with them. For treatment visits, patients cannot have anyone with them due to current Covid guidelines and our immunocompromised population.  ? ?

## 2021-05-22 NOTE — Progress Notes (Signed)
Nutrition Assessment ? ? ?Reason for Assessment: MST ? ? ?ASSESSMENT: 66 year old female plasmacytomas without multiple myeloma. Patient completed palliative radiation 5/12. She is currently receiving CyBorD (started 01/09/21). Patient is under the care of Dr. Lorenso Courier ? ?Met with patient during infusion. She reports feeling energy levels and appetite are improving. Patient recalls chicken salad, carrots, sauerkraut for lunch. This was a lot for her. Patient reports persistent constipation. She is on daily bowel regimen. Patient completed palliative radiation to T12 level and right ischial regions. Patient reports improved pain in legs. Her abdominal continues to feel like "pins and needles" She is taking gabapentin for this. Patient is mindful of water intake, reports water glasses in every room. She denies nausea, vomiting, diarrhea, altered taste.  ? ? ? ?Medications: senokot, miralax ? ? ?Labs: glucose 175, Cr 1.90 ? ? ?Anthropometrics:  ? ?Height: 5'7" ?Weight: 168 lb 12.8 oz  ?UBW: 190 lb (pt reports 10 lb wt loss prior to diagnosis during hiking trip) ?BMI: 26.44 ? ? ? ?NUTRITION DIAGNOSIS: Unintended weight loss related to cancer and associated treatment side effects as evidenced by reported decreased appetite, fatigue, constipation  ? ? ?INTERVENTION:  ?Discussed strategies for constipation - handout with tips plus recipes provided ?Suggested pt try 4 oz prune juice daily ?Continue bowel regimen ?Suggested trying oral nutrition supplement for added calories and protein - samples of [place] provided ?Contact information given  ? ? ? ? ?MONITORING, EVALUATION, GOAL: Patient will tolerate increased calories and protein to minimize further weight loss ? ? ?Next Visit: Wednesday June 14 during infusion  ? ? ? ? ? ? ?

## 2021-05-23 LAB — KAPPA/LAMBDA LIGHT CHAINS
Kappa free light chain: 8.4 mg/L (ref 3.3–19.4)
Kappa, lambda light chain ratio: 0.52 (ref 0.26–1.65)
Lambda free light chains: 16.1 mg/L (ref 5.7–26.3)

## 2021-05-27 LAB — MULTIPLE MYELOMA PANEL, SERUM
Albumin SerPl Elph-Mcnc: 3.9 g/dL (ref 2.9–4.4)
Albumin/Glob SerPl: 1.7 (ref 0.7–1.7)
Alpha 1: 0.3 g/dL (ref 0.0–0.4)
Alpha2 Glob SerPl Elph-Mcnc: 0.7 g/dL (ref 0.4–1.0)
B-Globulin SerPl Elph-Mcnc: 0.9 g/dL (ref 0.7–1.3)
Gamma Glob SerPl Elph-Mcnc: 0.5 g/dL (ref 0.4–1.8)
Globulin, Total: 2.3 g/dL (ref 2.2–3.9)
IgA: 11 mg/dL — ABNORMAL LOW (ref 87–352)
IgG (Immunoglobin G), Serum: 413 mg/dL — ABNORMAL LOW (ref 586–1602)
IgM (Immunoglobulin M), Srm: 13 mg/dL — ABNORMAL LOW (ref 26–217)
Total Protein ELP: 6.2 g/dL (ref 6.0–8.5)

## 2021-05-28 ENCOUNTER — Telehealth: Payer: Self-pay

## 2021-05-28 ENCOUNTER — Encounter: Payer: Self-pay | Admitting: Hematology and Oncology

## 2021-05-28 NOTE — Telephone Encounter (Signed)
Called and spoke with pt to confirm appts for tomorrow, 5/24. Pt verbalized confirmation.

## 2021-05-28 NOTE — Progress Notes (Signed)
                                                                                                                                                             Patient Name: Jordan Gregory MRN: [id number] DOB: 14-Dec-1955 Referring Physician: Dorothyann Peng (Profile Not Attached) Date of Service: 05/10/2021 [facility], [place]                                                        End Of Treatment Note  Diagnoses: C90.00-Multiple myeloma not having achieved remission  Cancer Staging: Multiple Myeloma  Intent: Palliative  Radiation Treatment Dates: 04/29/2021 through 05/10/2021 Site Technique Total Dose (Gy) Dose per Fx (Gy) Completed Fx Beam Energies  Ischium: Pelvis_Rt 3D 25/25 2.5 10/10 15X  Lumbar Spine: Spine_T12 Complex 25/25 2.5 10/10 15X   Narrative: The patient tolerated radiation therapy relatively well. She did continue to have some discomfort during radiation in the back and pelvic region and noted some fatigue during therapy.   Plan: The patient will receive a call in about one month from the radiation oncology department. She will continue follow up with Dr. Lorenso Courier as well.  ________________________________________________    Carola Rhine, [facility]

## 2021-05-29 ENCOUNTER — Other Ambulatory Visit: Payer: Self-pay

## 2021-05-29 ENCOUNTER — Inpatient Hospital Stay (HOSPITAL_BASED_OUTPATIENT_CLINIC_OR_DEPARTMENT_OTHER): Payer: 59 | Admitting: Physician Assistant

## 2021-05-29 ENCOUNTER — Inpatient Hospital Stay: Payer: 59

## 2021-05-29 VITALS — BP 103/68 | HR 108 | Temp 97.3°F | Resp 18 | Wt 166.5 lb

## 2021-05-29 VITALS — HR 98

## 2021-05-29 DIAGNOSIS — C9000 Multiple myeloma not having achieved remission: Secondary | ICD-10-CM

## 2021-05-29 DIAGNOSIS — M546 Pain in thoracic spine: Secondary | ICD-10-CM

## 2021-05-29 DIAGNOSIS — G8929 Other chronic pain: Secondary | ICD-10-CM

## 2021-05-29 DIAGNOSIS — Z5111 Encounter for antineoplastic chemotherapy: Secondary | ICD-10-CM | POA: Diagnosis not present

## 2021-05-29 DIAGNOSIS — E538 Deficiency of other specified B group vitamins: Secondary | ICD-10-CM

## 2021-05-29 LAB — CBC WITH DIFFERENTIAL (CANCER CENTER ONLY)
Abs Immature Granulocytes: 0.01 10*3/uL (ref 0.00–0.07)
Basophils Absolute: 0 10*3/uL (ref 0.0–0.1)
Basophils Relative: 1 %
Eosinophils Absolute: 0.1 10*3/uL (ref 0.0–0.5)
Eosinophils Relative: 3 %
HCT: 24.5 % — ABNORMAL LOW (ref 36.0–46.0)
Hemoglobin: 8.9 g/dL — ABNORMAL LOW (ref 12.0–15.0)
Immature Granulocytes: 0 %
Lymphocytes Relative: 9 %
Lymphs Abs: 0.3 10*3/uL — ABNORMAL LOW (ref 0.7–4.0)
MCH: 33.7 pg (ref 26.0–34.0)
MCHC: 36.3 g/dL — ABNORMAL HIGH (ref 30.0–36.0)
MCV: 92.8 fL (ref 80.0–100.0)
Monocytes Absolute: 0.2 10*3/uL (ref 0.1–1.0)
Monocytes Relative: 6 %
Neutro Abs: 2.2 10*3/uL (ref 1.7–7.7)
Neutrophils Relative %: 81 %
Platelet Count: 134 10*3/uL — ABNORMAL LOW (ref 150–400)
RBC: 2.64 MIL/uL — ABNORMAL LOW (ref 3.87–5.11)
RDW: 12.4 % (ref 11.5–15.5)
WBC Count: 2.7 10*3/uL — ABNORMAL LOW (ref 4.0–10.5)
nRBC: 0 % (ref 0.0–0.2)

## 2021-05-29 LAB — CMP (CANCER CENTER ONLY)
ALT: 13 U/L (ref 0–44)
AST: 12 U/L — ABNORMAL LOW (ref 15–41)
Albumin: 4.4 g/dL (ref 3.5–5.0)
Alkaline Phosphatase: 49 U/L (ref 38–126)
Anion gap: 10 (ref 5–15)
BUN: 16 mg/dL (ref 8–23)
CO2: 22 mmol/L (ref 22–32)
Calcium: 9.4 mg/dL (ref 8.9–10.3)
Chloride: 107 mmol/L (ref 98–111)
Creatinine: 1.69 mg/dL — ABNORMAL HIGH (ref 0.44–1.00)
GFR, Estimated: 33 mL/min — ABNORMAL LOW (ref >60)
Glucose, Bld: 129 mg/dL — ABNORMAL HIGH (ref 70–99)
Potassium: 3.7 mmol/L (ref 3.5–5.1)
Sodium: 139 mmol/L (ref 135–145)
Total Bilirubin: 0.5 mg/dL (ref 0.3–1.2)
Total Protein: 6.6 g/dL (ref 6.5–8.1)

## 2021-05-29 LAB — LACTATE DEHYDROGENASE: LDH: 148 U/L (ref 98–192)

## 2021-05-29 MED ORDER — BORTEZOMIB CHEMO SQ INJECTION 3.5 MG (2.5MG/ML)
1.5000 mg/m2 | Freq: Once | INTRAMUSCULAR | Status: AC
  Administered 2021-05-29: 3 mg via SUBCUTANEOUS
  Filled 2021-05-29: qty 1.2

## 2021-05-29 MED ORDER — CYANOCOBALAMIN 1000 MCG/ML IJ SOLN
1000.0000 ug | Freq: Once | INTRAMUSCULAR | Status: AC
  Administered 2021-05-29: 1000 ug via INTRAMUSCULAR
  Filled 2021-05-29: qty 1

## 2021-05-29 MED ORDER — SODIUM CHLORIDE 0.9 % IV SOLN: Freq: Once | INTRAVENOUS | Status: AC

## 2021-05-29 MED ORDER — SODIUM CHLORIDE 0.9 % IV SOLN
300.0000 mg/m2 | Freq: Once | INTRAVENOUS | Status: AC
  Administered 2021-05-29: 600 mg via INTRAVENOUS
  Filled 2021-05-29: qty 30

## 2021-05-29 MED ORDER — PALONOSETRON HCL INJECTION 0.25 MG/5ML
0.2500 mg | Freq: Once | INTRAVENOUS | Status: AC
  Administered 2021-05-29: 0.25 mg via INTRAVENOUS
  Filled 2021-05-29: qty 5

## 2021-05-29 NOTE — Progress Notes (Signed)
Per Murray Hodgkins, [place] ok to treat with creatine 1.69 mg/dL today.  Pt also to receive B12 injection today

## 2021-05-29 NOTE — Progress Notes (Signed)
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  Serological testing concerning for a plasma cell neoplasm, with UPEP showing 7.4 g of protein daily with markedly high lambda light chains -11/07/2020: Bone marrow biopsy performed, shows no evidence of multiple myeloma -11/23/2020: Biopsy of right clavicle mass confirmed plasmacytoma.  -11/27/2020: PET scan show intensely hypermetabolic large soft tissue masses arising from the LEFT and RIGHT ribs. Two large lesions in the LEFT chest wall and a single large lesion in the RIGHT chest wall.Hypermetabolic expansile solid masses arising from the medial RIGHT clavicle and RIGHT inferior pubic ischium 01/09/2021: Cycle 1, Day 1 CyBorD 02/06/2021: Cycle 2, Day 1 CyBorD 03/06/2021: Cycle 3, Day 1 CyBorD 04/03/2021: Cycle 4, Day 1 CyBorD  05/01/2021: Cycle 5, Day 1 CyBorD  05/29/2021: Cycle 6, Day 1 CyBorD   Interval History:  Jordan Gregory 66 y.o. 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 The patient was last seen on 05/15/2021. She is here for Cycle 6, Day 1 of CyBorD.   On exam today Ms. Gajda reports that since completion of radiation, she continues to have low back pain that radiates to the front of her abdomen.  The pain in her legs have improved.  She adds that she generally wakes up expecting to be in pain most of the day.  She reports that the pain improves in  the supine position.  She takes gabapentin which helps with the pain however she can only take it in the evening time as it causes her excessive sedation during the day.  She is hesitant to take oxycodone due to history of severe constipation.  She continues to have constipation but has taken laxatives daily with improvement.  Her last bowel movement was today.  She adds that her appetite decreases when she is constipated.  She does have nausea with 1 episode of vomiting in the past week.  She reports fatigue and the need to frequently rest.  She is able to complete her daily activities on her own.  She denies fevers, chills, night sweats, shortness of breath, chest pain or cough.  She has no other complaints.  Rest of the 10 point ROS is below.  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[place])    Thyroid disease    Vitamin D deficiency     SURGICAL HISTORY: Past Surgical History:  Procedure Laterality Date   MOHS SURGERY  2016    SOCIAL HISTORY: Social History   Socioeconomic History   Marital status: Married    Spouse name: Not on file   Number of children: Not on file   Years of education: Not on file   Highest education level: Not on file  Occupational History   Not on file  Tobacco Use   Smoking status: Never   Smokeless tobacco: Never  Vaping Use   Vaping Use: Never used  Substance and Sexual Activity   Alcohol use: Yes  Alcohol/week: 5.0 standard drinks    Types: 5 Standard drinks or equivalent per week    Comment: regular   Drug use: No   Sexual activity: Yes    Birth control/protection: Post-menopausal    Comment: 1st intercourse 48 yo-5 partners  Other Topics Concern   Not on file  Social History Narrative   She works at [employer] and [employer].    Married - 23 years    No kids       She likes to travel, read, cycle, be outside.    Social Determinants of Health   Financial Resource Strain: Not on file   Food Insecurity: Not on file  Transportation Needs: No Transportation Needs   Lack of Transportation (Medical): No   Lack of Transportation (Non-Medical): No  Physical Activity: Not on file  Stress: Not on file  Social Connections: Not on file  Intimate Partner Violence: Not At Risk   Fear of Current or Ex-Partner: No   Emotionally Abused: No   Physically Abused: No   Sexually Abused: No    FAMILY HISTORY: Family History  Problem Relation Age of Onset   Arthritis Mother    Hypertension Mother    Dementia Mother    Heart disease Father    Liver cancer Father    Asperger's syndrome Brother    Atrial fibrillation Brother     ALLERGIES:  is allergic to ramipril.  MEDICATIONS:  Current Outpatient Medications  Medication Sig Dispense Refill   acyclovir (ZOVIRAX) 400 MG tablet TAKE 1 TABLET BY MOUTH TWICE A DAY 180 tablet 3   allopurinol (ZYLOPRIM) 300 MG tablet TAKE 1 TABLET BY MOUTH EVERY DAY 90 tablet 1   amLODipine (NORVASC) 10 MG tablet Take 10 mg by mouth daily.     calcium carbonate (OS-CAL) 600 MG TABS Take 600 mg by mouth daily.     citalopram (CELEXA) 10 MG tablet TAKE 1 TABLET BY MOUTH EVERY DAY 90 tablet 0   Dexamethasone 20 MG TABS Take 40 mg by mouth once a week. Once a week. 60 tablet 3   gabapentin (NEURONTIN) 300 MG capsule Take 2 capsules (600 mg total) by mouth 3 (three) times daily. 180 capsule 1   levothyroxine (SYNTHROID) 75 MCG tablet TAKE 1 TABLET BY MOUTH DAILY BEFORE BREAKFAST. 90 tablet 3   ondansetron (ZOFRAN) 8 MG tablet Take 1 tablet (8 mg total) by mouth every 8 (eight) hours as needed for nausea or vomiting. 60 tablet 2   oxyCODONE (ROXICODONE) 5 MG immediate release tablet Take 1 tablet (5 mg total) by mouth every 4 (four) hours as needed for severe pain. 15 tablet 0   pantoprazole (PROTONIX) 40 MG tablet Take 1 tablet (40 mg total) by mouth daily. 7 tablet 0   prochlorperazine (COMPAZINE) 10 MG tablet Take 1 tablet (10 mg total) by mouth every 6  (six) hours as needed for nausea or vomiting. 60 tablet 2   traZODone (DESYREL) 50 MG tablet TAKE HALF TO 1 TABLET BY MOUTH AT BEDTIME AS NEEDED FOR SLEEP (Patient taking differently: Having to take #2 at bedtime) 90 tablet 1   VITAMIN D, CHOLECALCIFEROL, PO Take 2,000 Int'l Units by mouth daily.     No current facility-administered medications for this visit.    REVIEW OF SYSTEMS:   Constitutional: ( - ) fevers, ( - )  chills , ( - ) night sweats Eyes: ( - ) blurriness of vision, ( - ) double vision, ( - ) watery eyes Ears, nose,  mouth, throat, and face: ( - ) mucositis, ( - ) sore throat Respiratory: ( - ) cough, ( - ) dyspnea, ( - ) wheezes Cardiovascular: ( - ) palpitation, ( - ) chest discomfort, ( - ) lower extremity swelling Gastrointestinal:  ( - ) nausea, ( - ) heartburn, ( + ) change in bowel habits Skin: ( - ) abnormal skin rashes Lymphatics: ( - ) new lymphadenopathy, ( - ) easy bruising Neurological: ( - ) numbness, ( - ) tingling, ( - ) new weaknesses Behavioral/Psych: ( - ) mood change, ( - ) new changes  All other systems were reviewed with the patient and are negative.  PHYSICAL EXAMINATION: ECOG PERFORMANCE STATUS: 1 - Symptomatic but completely ambulatory  Vitals:   05/29/21 1320  BP: 103/68  Pulse: (!) 108  Resp: 18  Temp: (!) 97.3 F (36.3 C)  SpO2: 100%   Filed Weights   05/29/21 1320  Weight: 166 lb 8 oz (75.5 kg)    GENERAL: Well-appearing middle-age Caucasian female, alert, no distress and comfortable SKIN: skin color, texture, turgor are normal, no rashes or significant lesions EYES: conjunctiva are pink and non-injected, sclera clear CHEST: mass over right clavicle, firm but decreasing in size  LUNGS: clear to auscultation and percussion with normal breathing effort HEART: regular rate & rhythm and no murmurs and no lower extremity edema Musculoskeletal: no cyanosis of digits and no clubbing  PSYCH: alert & oriented x 3, fluent speech NEURO: no  focal motor/sensory deficits  LABORATORY DATA:  I have reviewed the data as listed    Latest Ref Rng & Units 05/29/2021    1:01 PM 05/22/2021    1:28 PM 05/15/2021   10:50 AM  CBC  WBC 4.0 - 10.5 K/uL 2.7   2.9   2.5    Hemoglobin 12.0 - 15.0 g/dL 8.9   8.8   9.0    Hematocrit 36.0 - 46.0 % 24.5   24.4   25.2    Platelets 150 - 400 K/uL 134   142   160         Latest Ref Rng & Units 05/22/2021    1:28 PM 05/15/2021   10:50 AM 05/08/2021   12:58 PM  CMP  Glucose 70 - 99 mg/dL 175   186   331    BUN 8 - 23 mg/dL _0 Creatinine 0.44 - 1.00 mg/dL 1.90   1.86   1.78    Sodium 135 - 145 mmol/L 138   137   136    Potassium 3.5 - 5.1 mmol/L 3.5   3.5   4.0    Chloride 98 - 111 mmol/L 107   107   105    CO2 22 - 32 mmol/L _1 Calcium 8.9 - 10.3 mg/dL 9.2   9.1   9.1    Total Protein 6.5 - 8.1 g/dL 6.4   6.8   6.5    Total Bilirubin 0.3 - 1.2 mg/dL 0.5   0.6   0.5    Alkaline Phos 38 - 126 U/L 47   44   49    AST 15 - 41 U/L _2 ALT 0 - 44 U/L _3 Lab Results  Component Value Date   MPROTEIN Not Observed 05/22/2021  MPROTEIN Not Observed 04/24/2021   MPROTEIN Not Observed 03/26/2021   Lab Results  Component Value Date   KPAFRELGTCHN 8.4 05/22/2021   KPAFRELGTCHN 8.3 04/24/2021   KPAFRELGTCHN 7.5 03/26/2021   LAMBDASER 16.1 05/22/2021   LAMBDASER 17.9 04/24/2021   LAMBDASER 26.8 (H) 03/26/2021   KAPLAMBRATIO 0.52 05/22/2021   KAPLAMBRATIO 0.46 04/24/2021   KAPLAMBRATIO 0.28 03/26/2021     RADIOGRAPHIC STUDIES: No results found.  [place] 66 y.o. 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  Previously we discussed the diagnosis and treatment options for plasmacytomas without multiple myeloma. This includes radiation therapy verus chemotherapy. Dr. Lorenso Courier discussed case with Dr. Maylene Roes from [facility] and the recommendation  was to proceed with chemotherapy due to patients decline in renal function. Patient is scheduled for a consultation with Dr. Maylene Roes on 01/11/2021.   Given the patient's kidney dysfunction at this time the treatment of choice is CyBorD chemotherapy.  We talked about the expected side effects of this regimen and the scheduling.  The patient voiced her understanding of this plan moving forward.   The CyBorD regimen consists of bortezomib 1.5 mg/m2 on days 1, 8, 15, and 22.  Cyclophosphamide 300 mg/m2 IV is administered on days 1, 8, 15, and 22.  Additionally the patient was taking p.o. dexamethasone 40 mg on days 1, 8, 15, and 22.  This is to be continued until the patient reaches a VGPR at which time we could consider transition to bortezomib maintenance.  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   --Serological studies are confirmatory of a plasma cell neoplasm with markedly high lambda light chains and proteinuria.  --Bone marrow biopsy from 11/07/2020 showed no evidence of multiple myeloma. Right clavicle biopsy from 11/23/2020 confirmed plasma cell neoplasm consistent with plasmacytomas.  --PET scan from 11/27/2020 showed multifocal hypermetabolic soft tissue massing arising from the left and right ribs, left and right chest wall, right clavicle and right inferior pubic ischium. Largest is solid mass in the anterior left chest wall measuring 10.2 x 9.9 cm.  --Recommended to initiate systemic chemotherapy due to acute renal dysfunction.  --Treatment of choice is CyBorD which started 01/10/2020.  --Weekly labs to check CBC, CMP and LDH. Check myeloma labs and UPEP monthly.  Plan: --Labs today were reviewed and adequate for treatment today. SPEP/IFE and sFLC pending today.  --Most recent M protein was undetectable on 05/22/2021 and lambda light chains improving to 16.1 with ratio of 0.52 (WNL) --Patient will proceed with Cycle  6, Day 1 of CyBorD today --RTC in 2 weeks Cycle 6, Day 15 with continued weekly treatment  #Neutropenia/Anemia: --Secondary to underlying malignancy. --Hgb 8.9 and ANC 2.2 today  #Renal dysfunction: --Secondary to underlying malignancy.  --Creatinine is 1.69, improving --Continue to monitor closely.   #Low back pain with neuropathic pain to bilateral legs: --Likely secondary to lytic lesions --Back pain has not improved since completion of palliative radiation. --Has oxycodone 5 mg PRN but hesitant to take due to existing constipation --Recommend to obtain CT CAP to further evaluate  --Currently on gabapentin 300 mg once night to help with pain but unable to take during the day due to drowsiness. Will switch to Lyrica for attempt of better pain control and less sedative effect.  #Supportive Care -- port placement not required -- zofran 43m q8H PRN and compazine 128mPO q6H for nausea --  acyclovir 460m PO BID for VCZ prophylaxis -- stool softeners/laxatives for constipation as needed -- no pain medication required at this time.   No orders of the defined types were placed in this encounter.  All questions were answered. The patient knows to call the clinic with any problems, questions or concerns.  I have spent a total of 30 minutes minutes of face-to-face and non-face-to-face time, preparing to see the patient, performing a medically appropriate examination, counseling and educating the patient, ordering medications, documenting clinical information in the electronic health record,  and care coordination.   ILincoln Brigham PA-C [facility] of Hematology and [facility]la[facility]ownat [facility] CenterPhone: [phone]

## 2021-06-03 ENCOUNTER — Encounter: Payer: Self-pay | Admitting: Physician Assistant

## 2021-06-03 ENCOUNTER — Encounter: Payer: Self-pay | Admitting: Hematology and Oncology

## 2021-06-03 MED ORDER — PREGABALIN 25 MG PO CAPS: 25.0000 mg | ORAL_CAPSULE | Freq: Two times a day (BID) | ORAL | 2 refills | Status: DC

## 2021-06-05 ENCOUNTER — Inpatient Hospital Stay: Payer: 59

## 2021-06-05 ENCOUNTER — Other Ambulatory Visit: Payer: Self-pay

## 2021-06-05 VITALS — BP 106/68 | HR 103 | Temp 98.2°F | Resp 12

## 2021-06-05 DIAGNOSIS — C9000 Multiple myeloma not having achieved remission: Secondary | ICD-10-CM

## 2021-06-05 DIAGNOSIS — Z5111 Encounter for antineoplastic chemotherapy: Secondary | ICD-10-CM | POA: Diagnosis not present

## 2021-06-05 LAB — CBC WITH DIFFERENTIAL (CANCER CENTER ONLY)
Abs Immature Granulocytes: 0.02 10*3/uL (ref 0.00–0.07)
Basophils Absolute: 0 10*3/uL (ref 0.0–0.1)
Basophils Relative: 1 %
Eosinophils Absolute: 0 10*3/uL (ref 0.0–0.5)
Eosinophils Relative: 2 %
HCT: 24.3 % — ABNORMAL LOW (ref 36.0–46.0)
Hemoglobin: 8.6 g/dL — ABNORMAL LOW (ref 12.0–15.0)
Immature Granulocytes: 1 %
Lymphocytes Relative: 4 %
Lymphs Abs: 0.1 10*3/uL — ABNORMAL LOW (ref 0.7–4.0)
MCH: 33.3 pg (ref 26.0–34.0)
MCHC: 35.4 g/dL (ref 30.0–36.0)
MCV: 94.2 fL (ref 80.0–100.0)
Monocytes Absolute: 0.1 10*3/uL (ref 0.1–1.0)
Monocytes Relative: 4 %
Neutro Abs: 2.2 10*3/uL (ref 1.7–7.7)
Neutrophils Relative %: 88 %
Platelet Count: 136 10*3/uL — ABNORMAL LOW (ref 150–400)
RBC: 2.58 MIL/uL — ABNORMAL LOW (ref 3.87–5.11)
RDW: 12.9 % (ref 11.5–15.5)
WBC Count: 2.5 10*3/uL — ABNORMAL LOW (ref 4.0–10.5)
nRBC: 0 % (ref 0.0–0.2)

## 2021-06-05 LAB — CMP (CANCER CENTER ONLY)
ALT: 21 U/L (ref 0–44)
AST: 20 U/L (ref 15–41)
Albumin: 4.1 g/dL (ref 3.5–5.0)
Alkaline Phosphatase: 43 U/L (ref 38–126)
Anion gap: 9 (ref 5–15)
BUN: 19 mg/dL (ref 8–23)
CO2: 20 mmol/L — ABNORMAL LOW (ref 22–32)
Calcium: 9.3 mg/dL (ref 8.9–10.3)
Chloride: 109 mmol/L (ref 98–111)
Creatinine: 1.71 mg/dL — ABNORMAL HIGH (ref 0.44–1.00)
GFR, Estimated: 33 mL/min — ABNORMAL LOW (ref >60)
Glucose, Bld: 175 mg/dL — ABNORMAL HIGH (ref 70–99)
Potassium: 3.9 mmol/L (ref 3.5–5.1)
Sodium: 138 mmol/L (ref 135–145)
Total Bilirubin: 0.5 mg/dL (ref 0.3–1.2)
Total Protein: 6.5 g/dL (ref 6.5–8.1)

## 2021-06-05 LAB — LACTATE DEHYDROGENASE: LDH: 147 U/L (ref 98–192)

## 2021-06-05 MED ORDER — PALONOSETRON HCL INJECTION 0.25 MG/5ML
0.2500 mg | Freq: Once | INTRAVENOUS | Status: AC
  Administered 2021-06-05: 0.25 mg via INTRAVENOUS
  Filled 2021-06-05: qty 5

## 2021-06-05 MED ORDER — BORTEZOMIB CHEMO SQ INJECTION 3.5 MG (2.5MG/ML)
1.5000 mg/m2 | Freq: Once | INTRAMUSCULAR | Status: AC
  Administered 2021-06-05: 3 mg via SUBCUTANEOUS
  Filled 2021-06-05: qty 1.2

## 2021-06-05 MED ORDER — SODIUM CHLORIDE 0.9 % IV SOLN: Freq: Once | INTRAVENOUS | Status: AC

## 2021-06-05 MED ORDER — SODIUM CHLORIDE 0.9 % IV SOLN
300.0000 mg/m2 | Freq: Once | INTRAVENOUS | Status: AC
  Administered 2021-06-05: 600 mg via INTRAVENOUS
  Filled 2021-06-05: qty 30

## 2021-06-05 NOTE — Progress Notes (Signed)
OK to treat despite elevated creat per Dr Lorenso Courier.

## 2021-06-05 NOTE — Patient Instructions (Signed)
[facility]  Discharge Instructions: Thank you for choosing [facility] to provide your oncology and hematology care.   If you have a lab appointment with the [facility], please go directly to the [facility] and check in at the registration area.   Wear comfortable clothing and clothing appropriate for easy access to any Portacath or PICC line.   We strive to give you quality time with your provider. You may need to reschedule your appointment if you arrive late (15 or more minutes).  Arriving late affects you and other patients whose appointments are after yours.  Also, if you miss three or more appointments without notifying the office, you may be dismissed from the clinic at the provider's discretion.      For prescription refill requests, have your pharmacy contact our office and allow 72 hours for refills to be completed.    Today you received the following chemotherapy and/or immunotherapy agents :  Cyclophosphamide, & Bortezomib.       To help prevent nausea and vomiting after your treatment, we encourage you to take your nausea medication as directed.  BELOW ARE SYMPTOMS THAT SHOULD BE REPORTED IMMEDIATELY: *FEVER GREATER THAN 100.4 F (38 C) OR HIGHER *CHILLS OR SWEATING *NAUSEA AND VOMITING THAT IS NOT CONTROLLED WITH YOUR NAUSEA MEDICATION *UNUSUAL SHORTNESS OF BREATH *UNUSUAL BRUISING OR BLEEDING *URINARY PROBLEMS (pain or burning when urinating, or frequent urination) *BOWEL PROBLEMS (unusual diarrhea, constipation, pain near the anus) TENDERNESS IN MOUTH AND THROAT WITH OR WITHOUT PRESENCE OF ULCERS (sore throat, sores in mouth, or a toothache) UNUSUAL RASH, SWELLING OR PAIN  UNUSUAL VAGINAL DISCHARGE OR ITCHING   Items with * indicate a potential emergency and should be followed up as soon as possible or go to the Emergency Department if any problems should occur.  Please show the CHEMOTHERAPY ALERT CARD or IMMUNOTHERAPY  ALERT CARD at check-in to the Emergency Department and triage nurse.  Should you have questions after your visit or need to cancel or reschedule your appointment, please contact [facility]  Dept: [phone]  and follow the prompts.  Office hours are 8:00 a.m. to 4:30 p.m. Monday - Friday. Please note that voicemails left after 4:00 p.m. may not be returned until the following business day.  We are closed weekends and major holidays. You have access to a nurse at all times for urgent questions. Please call the main number to the clinic Dept: [phone] and follow the prompts.   For any non-urgent questions, you may also contact your provider using MyChart. We now offer e-Visits for anyone 72 and older to request care online for non-urgent symptoms. For details visit [email or web address].   Also download the MyChart app! Go to the app store, search "MyChart", open the app, select [facility], and log in with your MyChart username and password.  Due to Covid, a mask is required upon entering the hospital/clinic. If you do not have a mask, one will be given to you upon arrival. For doctor visits, patients may have 1 support person aged 64 or older with them. For treatment visits, patients cannot have anyone with them due to current Covid guidelines and our immunocompromised population.

## 2021-06-06 ENCOUNTER — Ambulatory Visit (HOSPITAL_COMMUNITY)
Admission: RE | Admit: 2021-06-06 | Discharge: 2021-06-06 | Disposition: A | Discharge status: Home / Self Care | Payer: 59 | Source: Ambulatory Visit | Attending: Physician Assistant | Admitting: Physician Assistant

## 2021-06-06 DIAGNOSIS — C9000 Multiple myeloma not having achieved remission: Secondary | ICD-10-CM | POA: Diagnosis present

## 2021-06-06 DIAGNOSIS — G8929 Other chronic pain: Secondary | ICD-10-CM | POA: Insufficient documentation

## 2021-06-06 DIAGNOSIS — M546 Pain in thoracic spine: Secondary | ICD-10-CM | POA: Insufficient documentation

## 2021-06-06 IMAGING — CT CT CHEST-ABD-PELV W/O CM
2 of 4 series · 14 of 36 positions shown, 16 images · non-contrast
Comparison: CT abdomen/pelvis [DATE].  PET-CT [DATE]

CLINICAL DATA: Persistent back pain.  History of multiple myeloma.



[Series 2: cap w/o · axial · non-contrast · 0.84mm/px · z∈[-776,-231]mm · 11 of 131 slices shown, 13 images]
[im 11/131  mediastinal]
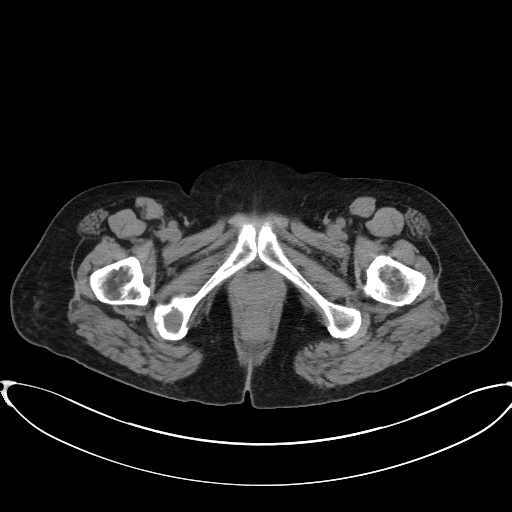
[im 11/131  bone]
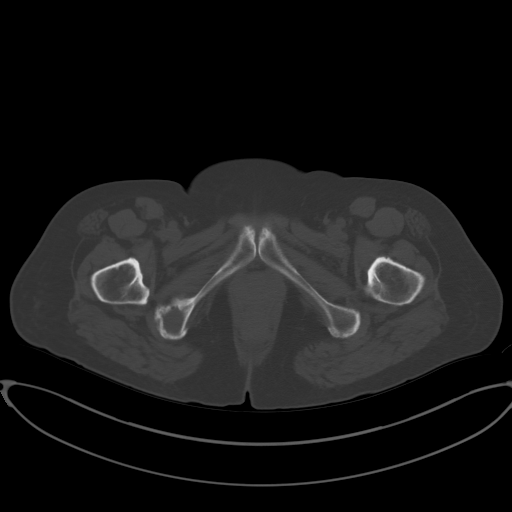
[im 22/131  mediastinal]
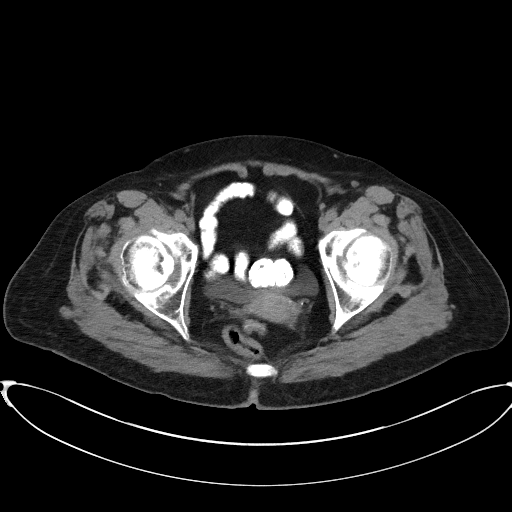
[im 33/131  mediastinal]
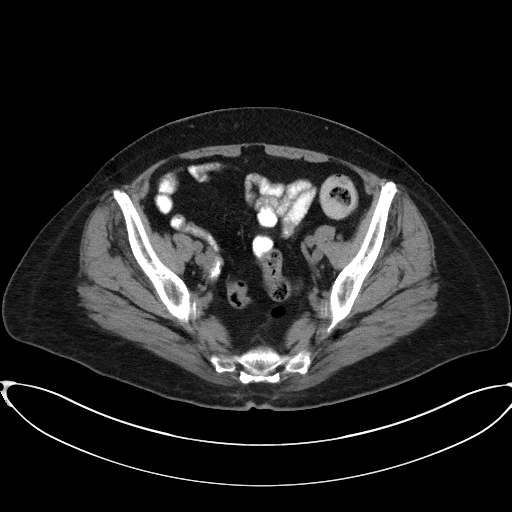
[im 44/131  mediastinal]
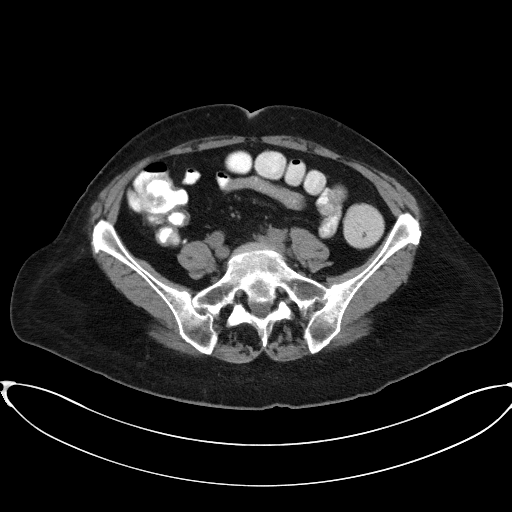
[im 55/131  mediastinal]
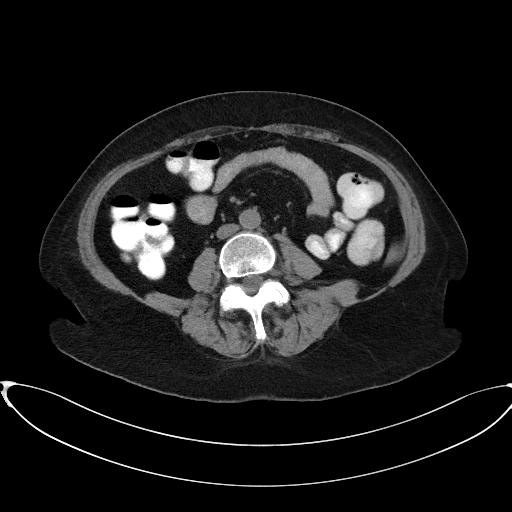
[im 66/131  mediastinal]
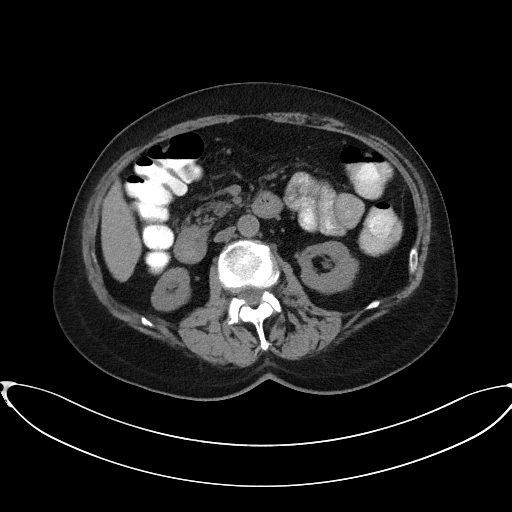
[im 76/131  mediastinal]
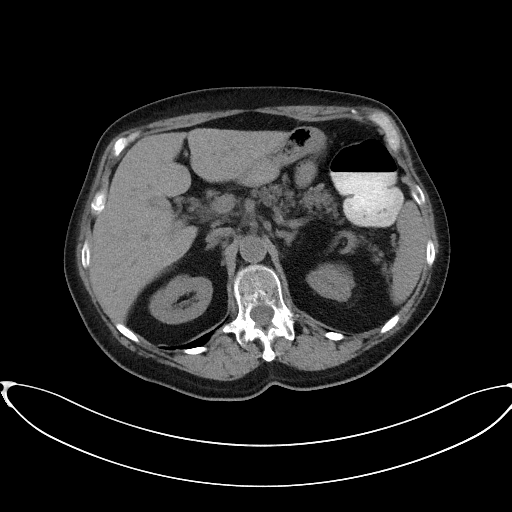
[im 87/131  mediastinal]
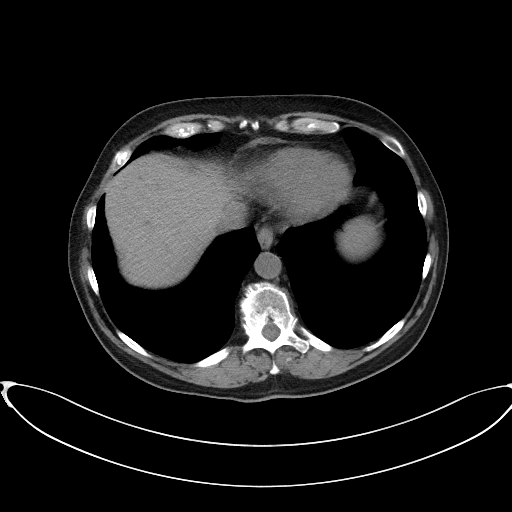
[im 98/131  mediastinal]
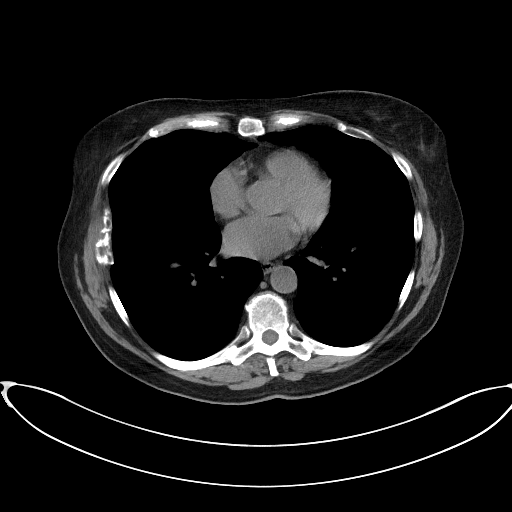
[im 98/131  bone]
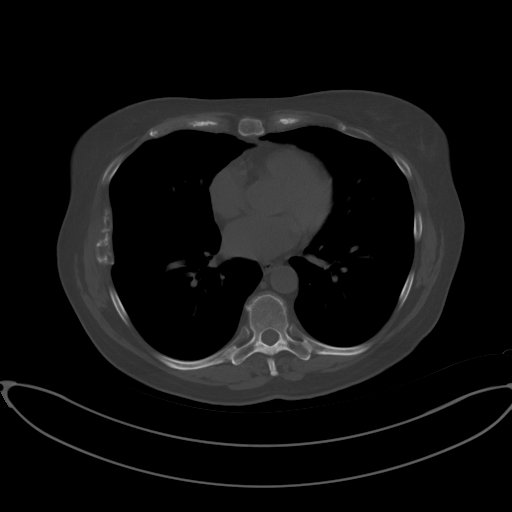
[im 109/131  mediastinal]
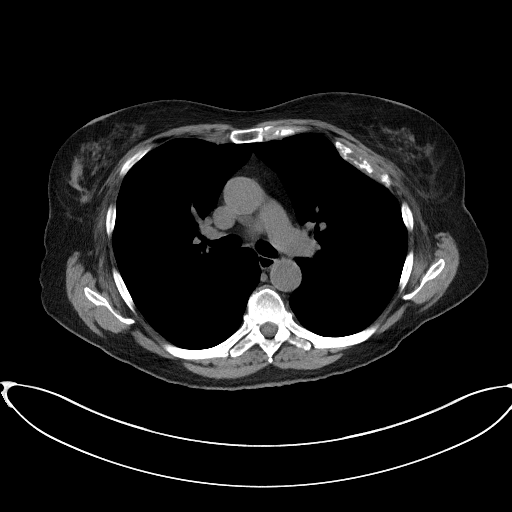
[im 120/131  mediastinal]
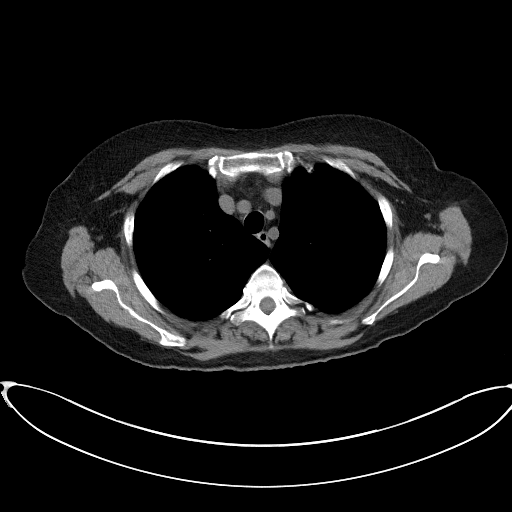

[Series 4: coronals · coronal · 0.79mm/px · 3 of 137 slices shown]
[im 28/137  mediastinal]
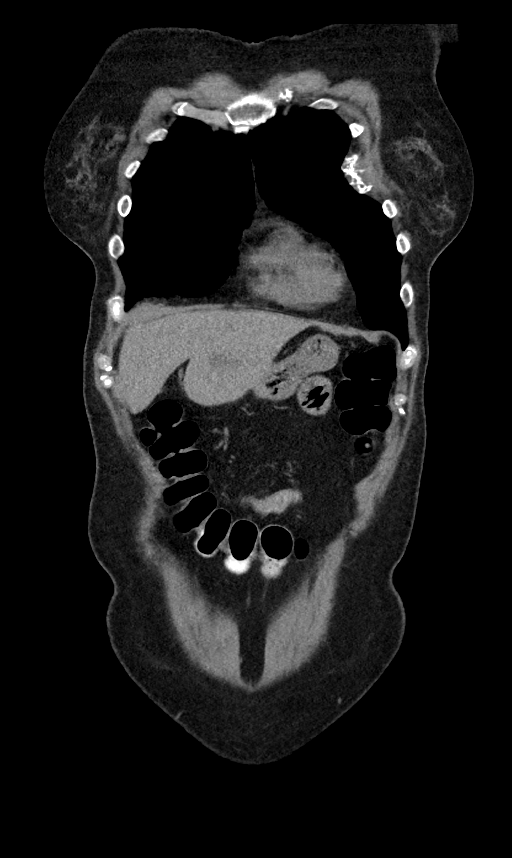
[im 55/137  mediastinal]
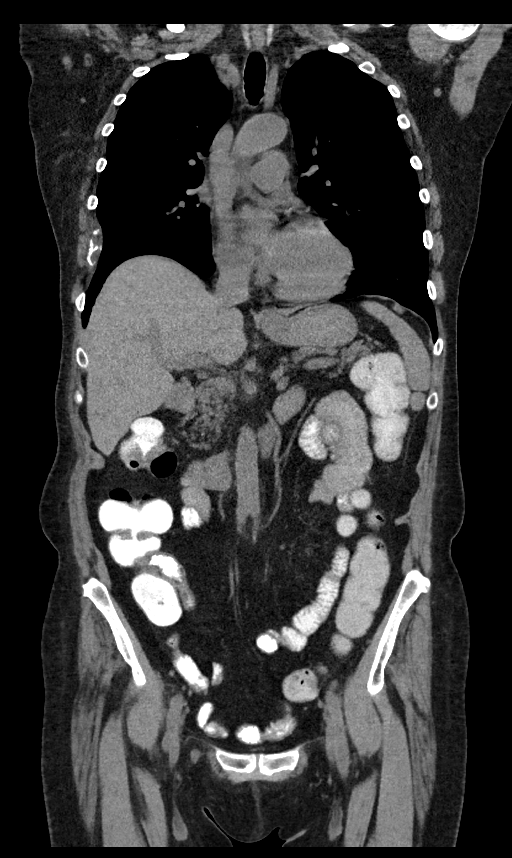
[im 82/137  mediastinal]
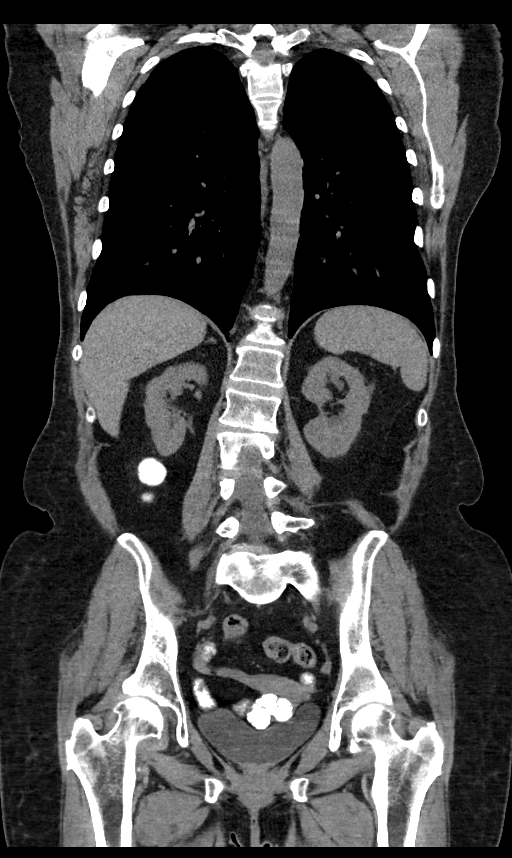

[14 of 36 positions shown; findings below may reference images not displayed]

FINDINGS: CT CHEST FINDINGS

Cardiovascular: The heart is normal in size. No pericardial
effusion. The aorta is normal in caliber. No coronary artery
calcifications.

Mediastinum/Nodes: No mediastinal or hilar mass or lymphadenopathy.
The esophagus is grossly normal.

Lungs/Pleura: No acute pulmonary findings. No worrisome pulmonary
lesions. No pleural effusions or pleural lesions.

Musculoskeletal: Again demonstrated are multiple myelomatous bone
lesions most notably involving the right clavicular head, on the
right seventh rib, the left second rib and the left fourth rib.
There were a large soft tissue masses associated with these lesions
on the prior PET-CT but these have completely resolved. No new soft
tissue mass is identified. No new bone lesions.

CT ABDOMEN PELVIS FINDINGS

Hepatobiliary: Small right hepatic lobe cyst. No worrisome hepatic
lesions without contrast. The gallbladder is grossly normal. No
common bile duct dilatation.

Pancreas: No mass, inflammation or ductal dilatation.

Spleen: Normal size.  No focal lesions.

Adrenals/Urinary Tract: The adrenal glands are normal.

No renal lesions, renal calculi or hydronephrosis. The bladder is
unremarkable.

Stomach/Bowel: Stomach, duodenum, small bowel and colon are
unremarkable. No acute inflammatory changes, mass lesions or
obstructive findings. The terminal ileum and appendix are normal.

Vascular/Lymphatic: The aorta is normal in caliber. No
atheroscerlotic calcifications. No mesenteric of retroperitoneal
mass or adenopathy. Small scattered lymph nodes are noted.

Reproductive: The uterus and ovaries are unremarkable. Stable
calcified uterine fibroids.

Other: No pelvic mass or adenopathy. No free pelvic fluid
collections. No inguinal mass or adenopathy. No abdominal wall
hernia or subcutaneous lesions.

Musculoskeletal: Stable myelomatous lesions involving the pelvis. I
do not see any spinal lesions to account for the patient's back
pain. The lytic lesion involving the right ischial tuberosity
appears stable. No associated soft tissue component outside the
bone. No new pelvic bone lesions. No stress fracture or AVN. Pubic
symphysis and SI joints are intact. Stable appearing moderate lumbar
facet disease.
IMPRESSION: 1. Stable myelomatous lesions involving the chest and pelvis. No new
or progressive findings.
2. The soft tissue masses associated with the chest lesions on the
prior PET-CT have completely resolved. No new soft tissue mass is
identified.
3. No spinal lesions to account for the patient's back pain.

## 2021-06-06 MED ORDER — IOHEXOL 9 MG/ML PO SOLN
500.0000 mL | ORAL | Status: AC
  Administered 2021-06-06 (×2): 500 mL via ORAL

## 2021-06-06 MED ORDER — SODIUM CHLORIDE (PF) 0.9 % IJ SOLN
INTRAMUSCULAR | Status: AC
  Filled 2021-06-06: qty 50

## 2021-06-06 MED ORDER — IOHEXOL 9 MG/ML PO SOLN
ORAL | Status: AC
  Filled 2021-06-06: qty 1000

## 2021-06-12 ENCOUNTER — Inpatient Hospital Stay: Payer: 59

## 2021-06-12 ENCOUNTER — Inpatient Hospital Stay: Payer: 59 | Attending: Physician Assistant | Admitting: Hematology and Oncology

## 2021-06-12 ENCOUNTER — Other Ambulatory Visit: Payer: Self-pay | Admitting: *Deleted

## 2021-06-12 VITALS — BP 115/76 | HR 115 | Temp 97.8°F | Resp 17 | Wt 162.4 lb

## 2021-06-12 DIAGNOSIS — G589 Mononeuropathy, unspecified: Secondary | ICD-10-CM | POA: Insufficient documentation

## 2021-06-12 DIAGNOSIS — Z79899 Other long term (current) drug therapy: Secondary | ICD-10-CM | POA: Diagnosis not present

## 2021-06-12 DIAGNOSIS — G620 Drug-induced polyneuropathy: Secondary | ICD-10-CM | POA: Diagnosis not present

## 2021-06-12 DIAGNOSIS — E538 Deficiency of other specified B group vitamins: Secondary | ICD-10-CM | POA: Diagnosis not present

## 2021-06-12 DIAGNOSIS — Z5111 Encounter for antineoplastic chemotherapy: Secondary | ICD-10-CM | POA: Diagnosis not present

## 2021-06-12 DIAGNOSIS — C9000 Multiple myeloma not having achieved remission: Secondary | ICD-10-CM

## 2021-06-12 DIAGNOSIS — T451X5A Adverse effect of antineoplastic and immunosuppressive drugs, initial encounter: Secondary | ICD-10-CM | POA: Insufficient documentation

## 2021-06-12 DIAGNOSIS — G8929 Other chronic pain: Secondary | ICD-10-CM

## 2021-06-12 DIAGNOSIS — C9030 Solitary plasmacytoma not having achieved remission: Secondary | ICD-10-CM | POA: Insufficient documentation

## 2021-06-12 DIAGNOSIS — Z5112 Encounter for antineoplastic immunotherapy: Secondary | ICD-10-CM | POA: Insufficient documentation

## 2021-06-12 DIAGNOSIS — M792 Neuralgia and neuritis, unspecified: Secondary | ICD-10-CM

## 2021-06-12 DIAGNOSIS — M89319 Hypertrophy of bone, unspecified shoulder: Secondary | ICD-10-CM

## 2021-06-12 LAB — CBC WITH DIFFERENTIAL (CANCER CENTER ONLY)
Abs Immature Granulocytes: 0.02 10*3/uL (ref 0.00–0.07)
Basophils Absolute: 0 10*3/uL (ref 0.0–0.1)
Basophils Relative: 0 %
Eosinophils Absolute: 0 10*3/uL (ref 0.0–0.5)
Eosinophils Relative: 0 %
HCT: 24.5 % — ABNORMAL LOW (ref 36.0–46.0)
Hemoglobin: 8.9 g/dL — ABNORMAL LOW (ref 12.0–15.0)
Immature Granulocytes: 1 %
Lymphocytes Relative: 3 %
Lymphs Abs: 0.1 10*3/uL — ABNORMAL LOW (ref 0.7–4.0)
MCH: 34 pg (ref 26.0–34.0)
MCHC: 36.3 g/dL — ABNORMAL HIGH (ref 30.0–36.0)
MCV: 93.5 fL (ref 80.0–100.0)
Monocytes Absolute: 0.1 10*3/uL (ref 0.1–1.0)
Monocytes Relative: 2 %
Neutro Abs: 2.9 10*3/uL (ref 1.7–7.7)
Neutrophils Relative %: 94 %
Platelet Count: 167 10*3/uL (ref 150–400)
RBC: 2.62 MIL/uL — ABNORMAL LOW (ref 3.87–5.11)
RDW: 13 % (ref 11.5–15.5)
WBC Count: 3.1 10*3/uL — ABNORMAL LOW (ref 4.0–10.5)
nRBC: 0 % (ref 0.0–0.2)

## 2021-06-12 LAB — CMP (CANCER CENTER ONLY)
ALT: 20 U/L (ref 0–44)
AST: 23 U/L (ref 15–41)
Albumin: 4.4 g/dL (ref 3.5–5.0)
Alkaline Phosphatase: 54 U/L (ref 38–126)
Anion gap: 14 (ref 5–15)
BUN: 16 mg/dL (ref 8–23)
CO2: 18 mmol/L — ABNORMAL LOW (ref 22–32)
Calcium: 9.4 mg/dL (ref 8.9–10.3)
Chloride: 107 mmol/L (ref 98–111)
Creatinine: 1.83 mg/dL — ABNORMAL HIGH (ref 0.44–1.00)
GFR, Estimated: 30 mL/min — ABNORMAL LOW (ref >60)
Glucose, Bld: 197 mg/dL — ABNORMAL HIGH (ref 70–99)
Potassium: 3.8 mmol/L (ref 3.5–5.1)
Sodium: 139 mmol/L (ref 135–145)
Total Bilirubin: 0.7 mg/dL (ref 0.3–1.2)
Total Protein: 6.8 g/dL (ref 6.5–8.1)

## 2021-06-12 LAB — LACTATE DEHYDROGENASE: LDH: 165 U/L (ref 98–192)

## 2021-06-12 MED ORDER — PALONOSETRON HCL INJECTION 0.25 MG/5ML
0.2500 mg | Freq: Once | INTRAVENOUS | Status: AC
  Administered 2021-06-12: 0.25 mg via INTRAVENOUS
  Filled 2021-06-12: qty 5

## 2021-06-12 MED ORDER — SODIUM CHLORIDE 0.9 % IV SOLN
300.0000 mg/m2 | Freq: Once | INTRAVENOUS | Status: AC
  Administered 2021-06-12: 600 mg via INTRAVENOUS
  Filled 2021-06-12: qty 30

## 2021-06-12 MED ORDER — BORTEZOMIB CHEMO SQ INJECTION 3.5 MG (2.5MG/ML)
1.5000 mg/m2 | Freq: Once | INTRAMUSCULAR | Status: AC
  Administered 2021-06-12: 3 mg via SUBCUTANEOUS
  Filled 2021-06-12: qty 1.2

## 2021-06-12 MED ORDER — SODIUM CHLORIDE 0.9 % IV SOLN: Freq: Once | INTRAVENOUS | Status: AC

## 2021-06-12 NOTE — Progress Notes (Signed)
Per Dr. Lorenso Courier, okay to treat with creatinine 1.83 and heart rate 106

## 2021-06-12 NOTE — Patient Instructions (Signed)
Jordan Gregory  Discharge Instructions: Thank you for choosing [facility] to provide your Gregory and hematology care.   If you have a lab appointment with the [facility], please go directly to the [facility] and check in at the registration area.   Wear comfortable clothing and clothing appropriate for easy access to any Portacath or PICC line.   We strive to give you quality time with your provider. You may need to reschedule your appointment if you arrive late (15 or more minutes).  Arriving late affects you and other patients whose appointments are after yours.  Also, if you miss three or more appointments without notifying the office, you may be dismissed from the clinic at the provider's discretion.      For prescription refill requests, have your pharmacy contact our office and allow 72 hours for refills to be completed.    Today you received the following chemotherapy and/or immunotherapy agents :  Cyclophosphamide, & Bortezomib.       To help prevent nausea and vomiting after your treatment, we encourage you to take your nausea medication as directed.  BELOW ARE SYMPTOMS THAT SHOULD BE REPORTED IMMEDIATELY: *FEVER GREATER THAN 100.4 F (38 C) OR HIGHER *CHILLS OR SWEATING *NAUSEA AND VOMITING THAT IS NOT CONTROLLED WITH YOUR NAUSEA MEDICATION *UNUSUAL SHORTNESS OF BREATH *UNUSUAL BRUISING OR BLEEDING *URINARY PROBLEMS (pain or burning when urinating, or frequent urination) *BOWEL PROBLEMS (unusual diarrhea, constipation, pain near the anus) TENDERNESS IN MOUTH AND THROAT WITH OR WITHOUT PRESENCE OF ULCERS (sore throat, sores in mouth, or a toothache) UNUSUAL RASH, SWELLING OR PAIN  UNUSUAL VAGINAL DISCHARGE OR ITCHING   Items with * indicate a potential emergency and should be followed up as soon as possible or go to the Emergency Department if any problems should occur.  Please show the CHEMOTHERAPY ALERT CARD or IMMUNOTHERAPY  ALERT CARD at check-in to the Emergency Department and triage nurse.  Should you have questions after your visit or need to cancel or reschedule your appointment, please contact [facility]  Dept: [phone]  and follow the prompts.  Office hours are 8:00 a.m. to 4:30 p.m. Monday - Friday. Please note that voicemails left after 4:00 p.m. may not be returned until the following business day.  We are closed weekends and major holidays. You have access to a nurse at all times for urgent questions. Please call the main number to the clinic Dept: [phone] and follow the prompts.   For any non-urgent questions, you may also contact your provider using MyChart. We now offer e-Visits for anyone 37 and older to request care online for non-urgent symptoms. For details visit [email or web address].   Also download the MyChart app! Go to the app store, search "MyChart", open the app, select [facility], and log in with your MyChart username and password.  Due to Covid, a mask is required upon entering the hospital/clinic. If you do not have a mask, one will be given to you upon arrival. For doctor visits, patients may have 1 support Malayjah Otoole aged 66 or older with them. For treatment visits, patients cannot have anyone with them due to current Covid guidelines and our immunocompromised population.

## 2021-06-12 NOTE — Progress Notes (Signed)
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  Serological testing concerning for a plasma cell neoplasm, with UPEP showing 7.4 g of protein daily with markedly high lambda light chains -11/07/2020: Bone marrow biopsy performed, shows no evidence of multiple myeloma -11/23/2020: Biopsy of right clavicle mass confirmed plasmacytoma.  -11/27/2020: PET scan show intensely hypermetabolic large soft tissue masses arising from the LEFT and RIGHT ribs. Two large lesions in the LEFT chest wall and a single large lesion in the RIGHT chest wall.Hypermetabolic expansile solid masses arising from the medial RIGHT clavicle and RIGHT inferior pubic ischium 01/09/2021: Cycle 1, Day 1 CyBorD 02/06/2021: Cycle 2, Day 1 CyBorD 03/06/2021: Cycle 3, Day 1 CyBorD 04/03/2021: Cycle 4, Day 1 CyBorD  05/01/2021: Cycle 5, Day 1 CyBorD  05/29/2021: Cycle 6, Day 1 CyBorD   Interval History:  Duanne Guess 66 y.o. 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 The patient was last seen on 05/29/2021. She is here for Cycle 6, Day 15 of CyBorD.   On exam today Ms. Galeno reports she is switched to Lyrica in order to help the bandlike pain she is experiencing across her abdomen to her back.  She notes that as the steroids were off she has progressively more difficult with this pain.  She reports that she is not feeling there is been any great  change in the Lyrica but she has been able to sleep better.  She reports that she is prone to episodes of lightheadedness and dizziness.  She is had several episodes where she had to bring her self to the ground due to the symptoms.  She notes that she also had an episode where she was experiencing nausea.  Today she reports that she is not as dizzy but does continue to struggle with fatigue.  She denies fevers, chills, night sweats, shortness of breath, chest pain or cough.  She has no other complaints.  Rest of the 10 point ROS is below.  We discussed the bone marrow transplant in greater detail.  She noted that she felt like it would be too intensive for her with her current state of health and she would prefer to transition to maintenance therapy when the time comes.  She is rapidly approaching that point we discussed that today.  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[place])    Thyroid disease    Vitamin D deficiency     SURGICAL HISTORY: Past Surgical History:  Procedure Laterality Date   MOHS SURGERY  2016    SOCIAL HISTORY: Social History   Socioeconomic History   Marital status: Married    Spouse name: Not on file   Number of children: Not on file   Years of education: Not on file   Highest education level: Not on file  Occupational History   Not on file  Tobacco Use   Smoking status: Never   Smokeless tobacco: Never  Vaping Use  Vaping Use: Never used  Substance and Sexual Activity   Alcohol use: Yes    Alcohol/week: 5.0 standard drinks    Types: 5 Standard drinks or equivalent per week    Comment: regular   Drug use: No   Sexual activity: Yes    Birth control/protection: Post-menopausal    Comment: 1st intercourse 74 yo-5 partners  Other Topics Concern   Not on file  Social History Narrative   She works at [employer] and [employer].    Married - 23 years    No kids       She likes to travel, read, cycle,  be outside.    Social Determinants of Health   Financial Resource Strain: Not on file  Food Insecurity: Not on file  Transportation Needs: No Transportation Needs   Lack of Transportation (Medical): No   Lack of Transportation (Non-Medical): No  Physical Activity: Not on file  Stress: Not on file  Social Connections: Not on file  Intimate Partner Violence: Not At Risk   Fear of Current or Ex-Partner: No   Emotionally Abused: No   Physically Abused: No   Sexually Abused: No    FAMILY HISTORY: Family History  Problem Relation Age of Onset   Arthritis Mother    Hypertension Mother    Dementia Mother    Heart disease Father    Liver cancer Father    Asperger's syndrome Brother    Atrial fibrillation Brother     ALLERGIES:  is allergic to ramipril.  MEDICATIONS:  Current Outpatient Medications  Medication Sig Dispense Refill   acyclovir (ZOVIRAX) 400 MG tablet TAKE 1 TABLET BY MOUTH TWICE A DAY 180 tablet 3   allopurinol (ZYLOPRIM) 300 MG tablet TAKE 1 TABLET BY MOUTH EVERY DAY 90 tablet 1   amLODipine (NORVASC) 10 MG tablet Take 10 mg by mouth daily.     calcium carbonate (OS-CAL) 600 MG TABS Take 600 mg by mouth daily.     citalopram (CELEXA) 10 MG tablet TAKE 1 TABLET BY MOUTH EVERY DAY 90 tablet 0   Dexamethasone 20 MG TABS Take 40 mg by mouth once a week. Once a week. 60 tablet 3   levothyroxine (SYNTHROID) 75 MCG tablet TAKE 1 TABLET BY MOUTH DAILY BEFORE BREAKFAST. 90 tablet 3   ondansetron (ZOFRAN) 8 MG tablet Take 1 tablet (8 mg total) by mouth every 8 (eight) hours as needed for nausea or vomiting. 60 tablet 2   oxyCODONE (ROXICODONE) 5 MG immediate release tablet Take 1 tablet (5 mg total) by mouth every 4 (four) hours as needed for severe pain. (Patient not taking: Reported on 06/12/2021) 15 tablet 0   pantoprazole (PROTONIX) 40 MG tablet Take 1 tablet (40 mg total) by mouth daily. 7 tablet 0   pregabalin (LYRICA) 25 MG capsule Take 1 capsule (25 mg total) by mouth  2 (two) times daily. 60 capsule 2   prochlorperazine (COMPAZINE) 10 MG tablet Take 1 tablet (10 mg total) by mouth every 6 (six) hours as needed for nausea or vomiting. 60 tablet 2   traZODone (DESYREL) 50 MG tablet TAKE HALF TO 1 TABLET BY MOUTH AT BEDTIME AS NEEDED FOR SLEEP (Patient taking differently: Having to take #2 at bedtime) 90 tablet 1   VITAMIN D, CHOLECALCIFEROL, PO Take 2,000 Int'l Units by mouth daily.     No current facility-administered medications for this visit.   Facility-Administered Medications Ordered in Other Visits  Medication Dose Route Frequency Provider Last Rate Last Admin  cyclophosphamide (CYTOXAN) 600 mg in sodium chloride 0.9 % 250 mL chemo infusion  300 mg/m2 (Treatment Plan Recorded) Intravenous Once Orson Slick, MD 560 mL/hr at 06/12/21 1657 600 mg at 06/12/21 1657    REVIEW OF SYSTEMS:   Constitutional: ( - ) fevers, ( - )  chills , ( - ) night sweats Eyes: ( - ) blurriness of vision, ( - ) double vision, ( - ) watery eyes Ears, nose, mouth, throat, and face: ( - ) mucositis, ( - ) sore throat Respiratory: ( - ) cough, ( - ) dyspnea, ( - ) wheezes Cardiovascular: ( - ) palpitation, ( - ) chest discomfort, ( - ) lower extremity swelling Gastrointestinal:  ( - ) nausea, ( - ) heartburn, ( + ) change in bowel habits Skin: ( - ) abnormal skin rashes Lymphatics: ( - ) new lymphadenopathy, ( - ) easy bruising Neurological: ( - ) numbness, ( - ) tingling, ( - ) new weaknesses Behavioral/Psych: ( - ) mood change, ( - ) new changes  All other systems were reviewed with the patient and are negative.  PHYSICAL EXAMINATION: ECOG PERFORMANCE STATUS: 1 - Symptomatic but completely ambulatory  Vitals:   06/12/21 1417  BP: 115/76  Pulse: (!) 115  Resp: 17  Temp: 97.8 F (36.6 C)  SpO2: 98%   Filed Weights   06/12/21 1417  Weight: 162 lb 6.4 oz (73.7 kg)    GENERAL: Well-appearing middle-age Caucasian female, alert, no distress and  comfortable SKIN: skin color, texture, turgor are normal, no rashes or significant lesions EYES: conjunctiva are pink and non-injected, sclera clear CHEST: mass over right clavicle, firm but decreasing in size  LUNGS: clear to auscultation and percussion with normal breathing effort HEART: regular rate & rhythm and no murmurs and no lower extremity edema Musculoskeletal: no cyanosis of digits and no clubbing  PSYCH: alert & oriented x 3, fluent speech NEURO: no focal motor/sensory deficits  LABORATORY DATA:  I have reviewed the data as listed    Latest Ref Rng & Units 06/12/2021    1:49 PM 06/05/2021    1:39 PM 05/29/2021    1:01 PM  CBC  WBC 4.0 - 10.5 K/uL 3.1   2.5   2.7    Hemoglobin 12.0 - 15.0 g/dL 8.9   8.6   8.9    Hematocrit 36.0 - 46.0 % 24.5   24.3   24.5    Platelets 150 - 400 K/uL 167   136   134         Latest Ref Rng & Units 06/12/2021    1:49 PM 06/05/2021    1:39 PM 05/29/2021    1:01 PM  CMP  Glucose 70 - 99 mg/dL 197   175   129    BUN 8 - 23 mg/dL _0 Creatinine 0.44 - 1.00 mg/dL 1.83   1.71   1.69    Sodium 135 - 145 mmol/L 139   138   139    Potassium 3.5 - 5.1 mmol/L 3.8   3.9   3.7    Chloride 98 - 111 mmol/L 107   109   107    CO2 22 - 32 mmol/L _1 Calcium 8.9 - 10.3 mg/dL 9.4   9.3   9.4    Total Protein 6.5 - 8.1 g/dL 6.8   6.5   6.6  Total Bilirubin 0.3 - 1.2 mg/dL 0.7   0.5   0.5    Alkaline Phos 38 - 126 U/L 54   43   49    AST 15 - 41 U/L _0 ALT 0 - 44 U/L _1 Lab Results  Component Value Date   MPROTEIN Not Observed 05/22/2021   MPROTEIN Not Observed 04/24/2021   MPROTEIN Not Observed 03/26/2021   Lab Results  Component Value Date   KPAFRELGTCHN 8.4 05/22/2021   KPAFRELGTCHN 8.3 04/24/2021   KPAFRELGTCHN 7.5 03/26/2021   LAMBDASER 16.1 05/22/2021   LAMBDASER 17.9 04/24/2021   LAMBDASER 26.8 (H) 03/26/2021   KAPLAMBRATIO 0.52 05/22/2021   KAPLAMBRATIO 0.46 04/24/2021    KAPLAMBRATIO 0.28 03/26/2021     RADIOGRAPHIC STUDIES: CT CHEST ABDOMEN PELVIS WO CONTRAST  Result Date: 06/06/2021 CLINICAL DATA:  Persistent back pain.  History of multiple myeloma. EXAM: CT CHEST, ABDOMEN AND PELVIS WITHOUT CONTRAST TECHNIQUE: Multidetector CT imaging of the chest, abdomen and pelvis was performed following the standard protocol without IV contrast. RADIATION DOSE REDUCTION: This exam was performed according to the departmental dose-optimization program which includes automated exposure control, adjustment of the mA and/or kV according to patient size and/or use of iterative reconstruction technique. COMPARISON:  CT abdomen/pelvis 03/18/2021.  PET-CT 11/27/2020 FINDINGS: CT CHEST FINDINGS Cardiovascular: The heart is normal in size. No pericardial effusion. The aorta is normal in caliber. No coronary artery calcifications. Mediastinum/Nodes: No mediastinal or hilar mass or lymphadenopathy. The esophagus is grossly normal. Lungs/Pleura: No acute pulmonary findings. No worrisome pulmonary lesions. No pleural effusions or pleural lesions. Musculoskeletal: Again demonstrated are multiple myelomatous bone lesions most notably involving the right clavicular head, on the right seventh rib, the left second rib and the left fourth rib. There were a large soft tissue masses associated with these lesions on the prior PET-CT but these have completely resolved. No new soft tissue mass is identified. No new bone lesions. CT ABDOMEN PELVIS FINDINGS Hepatobiliary: Small right hepatic lobe cyst. No worrisome hepatic lesions without contrast. The gallbladder is grossly normal. No common bile duct dilatation. Pancreas: No mass, inflammation or ductal dilatation. Spleen: Normal size.  No focal lesions. Adrenals/Urinary Tract: The adrenal glands are normal. No renal lesions, renal calculi or hydronephrosis. The bladder is unremarkable. Stomach/Bowel: Stomach, duodenum, small bowel and colon are unremarkable. No  acute inflammatory changes, mass lesions or obstructive findings. The terminal ileum and appendix are normal. Vascular/Lymphatic: The aorta is normal in caliber. No atheroscerlotic calcifications. No mesenteric of retroperitoneal mass or adenopathy. Small scattered lymph nodes are noted. Reproductive: The uterus and ovaries are unremarkable. Stable calcified uterine fibroids. Other: No pelvic mass or adenopathy. No free pelvic fluid collections. No inguinal mass or adenopathy. No abdominal wall hernia or subcutaneous lesions. Musculoskeletal: Stable myelomatous lesions involving the pelvis. I do not see any spinal lesions to account for the patient's back pain. The lytic lesion involving the right ischial tuberosity appears stable. No associated soft tissue component outside the bone. No new pelvic bone lesions. No stress fracture or AVN. Pubic symphysis and SI joints are intact. Stable appearing moderate lumbar facet disease. IMPRESSION: 1. Stable myelomatous lesions involving the chest and pelvis. No new or progressive findings. 2. The soft tissue masses associated with the chest lesions on the prior PET-CT have completely resolved. No new soft tissue mass is identified. 3. No spinal lesions to account for  the patient's back pain. Electronically Signed   By: Marijo Sanes M.D.   On: 06/06/2021 16:48    ASSESSMENT & PLAN Tzipporah Nagorski [facility] 66 y.o. 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  Previously we discussed the diagnosis and treatment options for plasmacytomas without multiple myeloma. This includes radiation therapy verus chemotherapy. Dr. Lorenso Courier discussed case with [facility]. May[facility] and the recommendation was to proceed with chemotherapy due to patients decline in renal function. Patient is scheduled for a consultation with Dr. Maylene Roes on 01/11/2021.   Given the patient's kidney dysfunction at this time the treatment of  choice is CyBorD chemotherapy.  We talked about the expected side effects of this regimen and the scheduling.  The patient voiced her understanding of this plan moving forward.   The CyBorD regimen consists of bortezomib 1.5 mg/m2 on days 1, 8, 15, and 22.  Cyclophosphamide 300 mg/m2 IV is administered on days 1, 8, 15, and 22.  Additionally the patient was taking p.o. dexamethasone 40 mg on days 1, 8, 15, and 22.  This is to be continued until the patient reaches a VGPR at which time we could consider transition to bortezomib maintenance.  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   --Serological studies are confirmatory of a plasma cell neoplasm with markedly high lambda light chains and proteinuria.  --Bone marrow biopsy from 11/07/2020 showed no evidence of multiple myeloma. Right clavicle biopsy from 11/23/2020 confirmed plasma cell neoplasm consistent with plasmacytomas.  --PET scan from 11/27/2020 showed multifocal hypermetabolic soft tissue massing arising from the left and right ribs, left and right chest wall, right clavicle and right inferior pubic ischium. Largest is solid mass in the anterior left chest wall measuring 10.2 x 9.9 cm.  --Recommended to initiate systemic chemotherapy due to acute renal dysfunction.  --Treatment of choice is CyBorD which started 01/10/2020.  --Weekly labs to check CBC, CMP and LDH. Check myeloma labs and UPEP monthly.  Plan: --Labs today were reviewed and adequate for treatment today. SPEP/IFE and sFLC pending today.  --Most recent M protein was undetectable on 05/22/2021 and lambda light chains improving to 16.1 with ratio of 0.52 (WNL) --Patient will proceed with Cycle 6, Day 15 of CyBorD today -- Patient has reached undetectable M protein in the blood, normalized serum free light chains.  We will order UPEP in order to assess for residual M protein.  If M protein is undetectable  can consider transition to maintenance therapy. --RTC in 2 weeks Cycle 7, Day 1 with continued weekly treatment  #Neutropenia/Anemia: --Secondary to underlying malignancy. --Hgb 8.9 and ANC 2.9 today  #Renal dysfunction: --Secondary to underlying malignancy.  --Creatinine is 1.83, improving --Continue to monitor closely.   #Low back pain with neuropathic pain to bilateral legs: --Likely secondary to lytic lesions --Back pain has not improved since completion of palliative radiation. --Has oxycodone 5 mg PRN but hesitant to take due to existing constipation --Recommend to obtain CT CAP to further evaluate  --Currently on gabapentin 300 mg once night to help with pain but unable to take during the day due to drowsiness. Will switch to Lyrica for attempt of better pain control and less sedative effect.  #Supportive Care -- port placement not required -- zofran 86m q8H PRN and compazine 172mPO q6H for nausea -- acyclovir 40042mO BID for VCZ prophylaxis -- stool softeners/laxatives for constipation as  needed -- no pain medication required at this time.   Orders Placed This Encounter  Procedures   24-Hr Ur UPEP/UIFE/Light Chains/TP    Standing Status:   Future    Standing Expiration Date:   06/12/2022   All questions were answered. The patient knows to call the clinic with any problems, questions or concerns.  I have spent a total of 30 minutes minutes of face-to-face and non-face-to-face time, preparing to see the patient, performing a medically appropriate examination, counseling and educating the patient, ordering medications, documenting clinical information in the electronic health record,  and care coordination.   Ledell Peoples, MD Department of Hematology/Oncology [place] at [facility] Phone: [phone] Pager: [phone] Email: [email or web address]

## 2021-06-17 ENCOUNTER — Ambulatory Visit
Admission: RE | Admit: 2021-06-17 | Discharge: 2021-06-17 | Disposition: A | Discharge status: Home / Self Care | Payer: 59 | Source: Ambulatory Visit | Attending: Radiation Oncology | Admitting: Radiation Oncology

## 2021-06-17 DIAGNOSIS — C9000 Multiple myeloma not having achieved remission: Secondary | ICD-10-CM

## 2021-06-17 NOTE — Addendum Note (Signed)
Encounter addended by: Hayden Pedro, PA-C on: 06/17/2021 3:51 PM  Actions taken: Clinical Note Signed

## 2021-06-17 NOTE — Progress Notes (Addendum)
  Radiation Oncology         [phone] ________________________________  Name: Jordan Gregory MRN: [id number]  Date of Service: 06/17/2021  DOB: [birth year]  Post Treatment Telephone Note  Diagnosis:   Multiple Myeloma  Intent: Palliative  Radiation Treatment Dates:  04/29/2021 through 05/10/2021 Site Technique Total Dose (Gy) Dose per Fx (Gy) Completed Fx Beam Energies  Ischium: Pelvis_Rt 3D 25/25 2.5 10/10 15X  Lumbar Spine: Spine_T12 Complex 25/25 2.5 10/10 15X   Narrative: The patient tolerated radiation therapy relatively well. She did continue to have some discomfort during radiation in the back and pelvic region and noted some fatigue during therapy. She's still having pain in her mid and low back and is trying to meet with a neurologist as well.   Impression/Plan: 1.  Multiple Myeloma. The patient has been doing well since completion of radiotherapy. We discussed that we would be happy to continue to follow her as needed, but she will also continue to follow up with Dr. Lorenso Courier in medical oncology and will proceed with neurology as well.     Carola Rhine, PAC

## 2021-06-19 ENCOUNTER — Inpatient Hospital Stay: Payer: 59

## 2021-06-19 ENCOUNTER — Other Ambulatory Visit: Payer: Self-pay

## 2021-06-19 ENCOUNTER — Inpatient Hospital Stay: Payer: 59 | Admitting: Dietician

## 2021-06-19 VITALS — BP 111/68 | HR 92 | Temp 97.8°F | Resp 18 | Wt 162.8 lb

## 2021-06-19 DIAGNOSIS — C9000 Multiple myeloma not having achieved remission: Secondary | ICD-10-CM

## 2021-06-19 DIAGNOSIS — Z5111 Encounter for antineoplastic chemotherapy: Secondary | ICD-10-CM | POA: Diagnosis not present

## 2021-06-19 LAB — CBC WITH DIFFERENTIAL (CANCER CENTER ONLY)
Abs Immature Granulocytes: 0.04 10*3/uL (ref 0.00–0.07)
Basophils Absolute: 0 10*3/uL (ref 0.0–0.1)
Basophils Relative: 1 %
Eosinophils Absolute: 0 10*3/uL (ref 0.0–0.5)
Eosinophils Relative: 1 %
HCT: 25.5 % — ABNORMAL LOW (ref 36.0–46.0)
Hemoglobin: 8.5 g/dL — ABNORMAL LOW (ref 12.0–15.0)
Immature Granulocytes: 1 %
Lymphocytes Relative: 4 %
Lymphs Abs: 0.1 10*3/uL — ABNORMAL LOW (ref 0.7–4.0)
MCH: 33.5 pg (ref 26.0–34.0)
MCHC: 33.3 g/dL (ref 30.0–36.0)
MCV: 100.4 fL — ABNORMAL HIGH (ref 80.0–100.0)
Monocytes Absolute: 0.1 10*3/uL (ref 0.1–1.0)
Monocytes Relative: 3 %
Neutro Abs: 2.5 10*3/uL (ref 1.7–7.7)
Neutrophils Relative %: 90 %
Platelet Count: 161 10*3/uL (ref 150–400)
RBC: 2.54 MIL/uL — ABNORMAL LOW (ref 3.87–5.11)
RDW: 13.4 % (ref 11.5–15.5)
WBC Count: 2.8 10*3/uL — ABNORMAL LOW (ref 4.0–10.5)
nRBC: 0 % (ref 0.0–0.2)

## 2021-06-19 LAB — CMP (CANCER CENTER ONLY)
ALT: 12 U/L (ref 0–44)
AST: 13 U/L — ABNORMAL LOW (ref 15–41)
Albumin: 4.3 g/dL (ref 3.5–5.0)
Alkaline Phosphatase: 50 U/L (ref 38–126)
Anion gap: 13 (ref 5–15)
BUN: 17 mg/dL (ref 8–23)
CO2: 19 mmol/L — ABNORMAL LOW (ref 22–32)
Calcium: 9.5 mg/dL (ref 8.9–10.3)
Chloride: 107 mmol/L (ref 98–111)
Creatinine: 1.75 mg/dL — ABNORMAL HIGH (ref 0.44–1.00)
GFR, Estimated: 32 mL/min — ABNORMAL LOW (ref >60)
Glucose, Bld: 149 mg/dL — ABNORMAL HIGH (ref 70–99)
Potassium: 3.8 mmol/L (ref 3.5–5.1)
Sodium: 139 mmol/L (ref 135–145)
Total Bilirubin: 0.6 mg/dL (ref 0.3–1.2)
Total Protein: 6.5 g/dL (ref 6.5–8.1)

## 2021-06-19 LAB — LACTATE DEHYDROGENASE: LDH: 174 U/L (ref 98–192)

## 2021-06-19 MED ORDER — SODIUM CHLORIDE 0.9 % IV SOLN: Freq: Once | INTRAVENOUS | Status: AC

## 2021-06-19 MED ORDER — BORTEZOMIB CHEMO SQ INJECTION 3.5 MG (2.5MG/ML)
1.5000 mg/m2 | Freq: Once | INTRAMUSCULAR | Status: AC
  Administered 2021-06-19: 3 mg via SUBCUTANEOUS
  Filled 2021-06-19: qty 1.2

## 2021-06-19 MED ORDER — PALONOSETRON HCL INJECTION 0.25 MG/5ML
0.2500 mg | Freq: Once | INTRAVENOUS | Status: AC
  Administered 2021-06-19: 0.25 mg via INTRAVENOUS
  Filled 2021-06-19: qty 5

## 2021-06-19 MED ORDER — SODIUM CHLORIDE 0.9 % IV SOLN
300.0000 mg/m2 | Freq: Once | INTRAVENOUS | Status: AC
  Administered 2021-06-19: 600 mg via INTRAVENOUS
  Filled 2021-06-19: qty 30

## 2021-06-19 NOTE — Patient Instructions (Signed)
[facility]  Discharge Instructions: Thank you for choosing [facility] to provide your oncology and hematology care.   If you have a lab appointment with the [facility], please go directly to the [facility] and check in at the registration area.   Wear comfortable clothing and clothing appropriate for easy access to any Portacath or PICC line.   We strive to give you quality time with your provider. You may need to reschedule your appointment if you arrive late (15 or more minutes).  Arriving late affects you and other patients whose appointments are after yours.  Also, if you miss three or more appointments without notifying the office, you may be dismissed from the clinic at the provider's discretion.      For prescription refill requests, have your pharmacy contact our office and allow 72 hours for refills to be completed.    Today you received the following chemotherapy and/or immunotherapy agents :  Cyclophosphamide, & Bortezomib.       To help prevent nausea and vomiting after your treatment, we encourage you to take your nausea medication as directed.  BELOW ARE SYMPTOMS THAT SHOULD BE REPORTED IMMEDIATELY: *FEVER GREATER THAN 100.4 F (38 C) OR HIGHER *CHILLS OR SWEATING *NAUSEA AND VOMITING THAT IS NOT CONTROLLED WITH YOUR NAUSEA MEDICATION *UNUSUAL SHORTNESS OF BREATH *UNUSUAL BRUISING OR BLEEDING *URINARY PROBLEMS (pain or burning when urinating, or frequent urination) *BOWEL PROBLEMS (unusual diarrhea, constipation, pain near the anus) TENDERNESS IN MOUTH AND THROAT WITH OR WITHOUT PRESENCE OF ULCERS (sore throat, sores in mouth, or a toothache) UNUSUAL RASH, SWELLING OR PAIN  UNUSUAL VAGINAL DISCHARGE OR ITCHING   Items with * indicate a potential emergency and should be followed up as soon as possible or go to the Emergency Department if any problems should occur.  Please show the CHEMOTHERAPY ALERT CARD or IMMUNOTHERAPY  ALERT CARD at check-in to the Emergency Department and triage nurse.  Should you have questions after your visit or need to cancel or reschedule your appointment, please contact [facility]  Dept: [phone]  and follow the prompts.  Office hours are 8:00 a.m. to 4:30 p.m. Monday - Friday. Please note that voicemails left after 4:00 p.m. may not be returned until the following business day.  We are closed weekends and major holidays. You have access to a nurse at all times for urgent questions. Please call the main number to the clinic Dept: [phone] and follow the prompts.   For any non-urgent questions, you may also contact your provider using MyChart. We now offer e-Visits for anyone 29 and older to request care online for non-urgent symptoms. For details visit [email or web address].   Also download the MyChart app! Go to the app store, search "MyChart", open the app, select [facility], and log in with your MyChart username and password.  Due to Covid, a mask is required upon entering the hospital/clinic. If you do not have a mask, one will be given to you upon arrival. For doctor visits, patients may have 1 support Olayinka Gathers aged 67 or older with them. For treatment visits, patients cannot have anyone with them due to current Covid guidelines and our immunocompromised population.

## 2021-06-19 NOTE — Progress Notes (Signed)
Nutrition Follow-up:  Patient with plasmacytomas without multiple myeloma. Patient completed palliative radiation 5/12. She is currently receiving CyBorD (started 01/09/21).   Met with patient in infusion. She reports ongoing constipation. Patient on daily bowel regimen. She tried prune juice. This worked well, but has forgotten to drink this lately. Patient reports no bowel movement for ~6 days last week. She was unable to enjoy her birthday dinner on Saturday due to this. Patient endorses good bowel movement Monday (6/12) after magnesium citrate. She is drinking water frequently through out the day. Patient reports abdominal discomfort continues. This is worse when standing. Patient would like to work on getting her strength back.   Medications: reviewed  Labs: glucose 149, Cr 1.75  Anthropometrics: Weight 162 lb 12 oz today stable x one week  6/7 - 162 lb 6.4 oz 5/17 - 168 lb 12.8 oz   NUTRITION DIAGNOSIS: Unintended weight loss    INTERVENTION:  Continue bowel regimen Recommend 4 oz prune juice daily Reviewed small frequent meals and snacks Educated on [employer] support services programs including yoga, [place] chi that are offered in person as well as virtually - handout with calendar given Support and encouragement     MONITORING, EVALUATION, GOAL: weight trends, intake    NEXT VISIT: To be scheduled as needed

## 2021-06-19 NOTE — Progress Notes (Signed)
OK to treat per Dr Lorenso Courier with  elevated creatinine.

## 2021-06-20 LAB — KAPPA/LAMBDA LIGHT CHAINS
Kappa free light chain: 9.5 mg/L (ref 3.3–19.4)
Kappa, lambda light chain ratio: 0.44 (ref 0.26–1.65)
Lambda free light chains: 21.5 mg/L (ref 5.7–26.3)

## 2021-06-21 LAB — UPEP/UIFE/LIGHT CHAINS/TP, 24-HR UR
% BETA, Urine: 0 %
ALPHA 1 URINE: 0 %
Albumin, U: 100 %
Alpha 2, Urine: 0 %
Free Kappa Lt Chains,Ur: 3.29 mg/L (ref 1.17–86.46)
Free Kappa/Lambda Ratio: 2.79 (ref 1.83–14.26)
Free Lambda Lt Chains,Ur: 1.18 mg/L (ref 0.27–15.21)
GAMMA GLOBULIN URINE: 0 %
Total Protein, Urine-Ur/day: <78 mg/24 hr (ref 30–150)
Total Protein, Urine: <4 mg/dL
Total Volume: 1950

## 2021-06-24 LAB — MULTIPLE MYELOMA PANEL, SERUM
Albumin SerPl Elph-Mcnc: 3.8 g/dL (ref 2.9–4.4)
Albumin/Glob SerPl: 1.6 (ref 0.7–1.7)
Alpha 1: 0.3 g/dL (ref 0.0–0.4)
Alpha2 Glob SerPl Elph-Mcnc: 0.8 g/dL (ref 0.4–1.0)
B-Globulin SerPl Elph-Mcnc: 0.9 g/dL (ref 0.7–1.3)
Gamma Glob SerPl Elph-Mcnc: 0.4 g/dL (ref 0.4–1.8)
Globulin, Total: 2.4 g/dL (ref 2.2–3.9)
IgA: 12 mg/dL — ABNORMAL LOW (ref 87–352)
IgG (Immunoglobin G), Serum: 306 mg/dL — ABNORMAL LOW (ref 586–1602)
IgM (Immunoglobulin M), Srm: 8 mg/dL — ABNORMAL LOW (ref 26–217)
Total Protein ELP: 6.2 g/dL (ref 6.0–8.5)

## 2021-06-26 ENCOUNTER — Inpatient Hospital Stay: Payer: 59

## 2021-06-26 ENCOUNTER — Inpatient Hospital Stay (HOSPITAL_BASED_OUTPATIENT_CLINIC_OR_DEPARTMENT_OTHER): Payer: 59 | Admitting: Hematology and Oncology

## 2021-06-26 ENCOUNTER — Other Ambulatory Visit: Payer: Self-pay | Admitting: *Deleted

## 2021-06-26 ENCOUNTER — Telehealth: Payer: Self-pay | Admitting: Internal Medicine

## 2021-06-26 ENCOUNTER — Other Ambulatory Visit: Payer: Self-pay

## 2021-06-26 VITALS — BP 124/80 | HR 112 | Temp 97.9°F | Resp 17 | Wt 161.3 lb

## 2021-06-26 VITALS — BP 128/71 | HR 98 | Temp 97.8°F | Resp 18

## 2021-06-26 DIAGNOSIS — C9030 Solitary plasmacytoma not having achieved remission: Secondary | ICD-10-CM | POA: Diagnosis not present

## 2021-06-26 DIAGNOSIS — C9000 Multiple myeloma not having achieved remission: Secondary | ICD-10-CM

## 2021-06-26 DIAGNOSIS — D649 Anemia, unspecified: Secondary | ICD-10-CM | POA: Diagnosis not present

## 2021-06-26 DIAGNOSIS — R7989 Other specified abnormal findings of blood chemistry: Secondary | ICD-10-CM | POA: Diagnosis not present

## 2021-06-26 DIAGNOSIS — Z5111 Encounter for antineoplastic chemotherapy: Secondary | ICD-10-CM | POA: Diagnosis not present

## 2021-06-26 DIAGNOSIS — M792 Neuralgia and neuritis, unspecified: Secondary | ICD-10-CM

## 2021-06-26 LAB — CBC WITH DIFFERENTIAL (CANCER CENTER ONLY)
Abs Immature Granulocytes: 0.02 10*3/uL (ref 0.00–0.07)
Basophils Absolute: 0 10*3/uL (ref 0.0–0.1)
Basophils Relative: 1 %
Eosinophils Absolute: 0 10*3/uL (ref 0.0–0.5)
Eosinophils Relative: 0 %
HCT: 23.6 % — ABNORMAL LOW (ref 36.0–46.0)
Hemoglobin: 8.5 g/dL — ABNORMAL LOW (ref 12.0–15.0)
Immature Granulocytes: 1 %
Lymphocytes Relative: 4 %
Lymphs Abs: 0.1 10*3/uL — ABNORMAL LOW (ref 0.7–4.0)
MCH: 33.6 pg (ref 26.0–34.0)
MCHC: 36 g/dL (ref 30.0–36.0)
MCV: 93.3 fL (ref 80.0–100.0)
Monocytes Absolute: 0.1 10*3/uL (ref 0.1–1.0)
Monocytes Relative: 3 %
Neutro Abs: 2.2 10*3/uL (ref 1.7–7.7)
Neutrophils Relative %: 91 %
Platelet Count: 143 10*3/uL — ABNORMAL LOW (ref 150–400)
RBC: 2.53 MIL/uL — ABNORMAL LOW (ref 3.87–5.11)
RDW: 13.4 % (ref 11.5–15.5)
WBC Count: 2.5 10*3/uL — ABNORMAL LOW (ref 4.0–10.5)
nRBC: 0 % (ref 0.0–0.2)

## 2021-06-26 LAB — CMP (CANCER CENTER ONLY)
ALT: 20 U/L (ref 0–44)
AST: 22 U/L (ref 15–41)
Albumin: 4.3 g/dL (ref 3.5–5.0)
Alkaline Phosphatase: 52 U/L (ref 38–126)
Anion gap: 11 (ref 5–15)
BUN: 16 mg/dL (ref 8–23)
CO2: 20 mmol/L — ABNORMAL LOW (ref 22–32)
Calcium: 9.2 mg/dL (ref 8.9–10.3)
Chloride: 107 mmol/L (ref 98–111)
Creatinine: 1.55 mg/dL — ABNORMAL HIGH (ref 0.44–1.00)
GFR, Estimated: 36 mL/min — ABNORMAL LOW (ref >60)
Glucose, Bld: 136 mg/dL — ABNORMAL HIGH (ref 70–99)
Potassium: 3.7 mmol/L (ref 3.5–5.1)
Sodium: 138 mmol/L (ref 135–145)
Total Bilirubin: 0.8 mg/dL (ref 0.3–1.2)
Total Protein: 6.8 g/dL (ref 6.5–8.1)

## 2021-06-26 LAB — LACTATE DEHYDROGENASE: LDH: 168 U/L (ref 98–192)

## 2021-06-26 MED ORDER — GABAPENTIN 300 MG PO CAPS
300.0000 mg | ORAL_CAPSULE | Freq: Two times a day (BID) | ORAL | 1 refills | Status: DC
Start: 2021-06-26 — End: 2021-07-18

## 2021-06-26 MED ORDER — BORTEZOMIB CHEMO SQ INJECTION 3.5 MG (2.5MG/ML)
1.5000 mg/m2 | Freq: Once | INTRAMUSCULAR | Status: AC
  Administered 2021-06-26: 3 mg via SUBCUTANEOUS
  Filled 2021-06-26: qty 1.2

## 2021-06-26 MED ORDER — SODIUM CHLORIDE 0.9 % IV SOLN: Freq: Once | INTRAVENOUS | Status: AC

## 2021-06-26 MED ORDER — SODIUM CHLORIDE 0.9 % IV SOLN
300.0000 mg/m2 | Freq: Once | INTRAVENOUS | Status: AC
  Administered 2021-06-26: 600 mg via INTRAVENOUS
  Filled 2021-06-26: qty 30

## 2021-06-26 MED ORDER — PALONOSETRON HCL INJECTION 0.25 MG/5ML
0.2500 mg | Freq: Once | INTRAVENOUS | Status: AC
  Administered 2021-06-26: 0.25 mg via INTRAVENOUS
  Filled 2021-06-26: qty 5

## 2021-06-26 NOTE — Patient Instructions (Signed)
[facility] MEDICAL ONCOLOGY  Discharge Instructions: Thank you for choosing [facility] to provide your oncology and hematology care.   If you have a lab appointment with the Cancer Center, please go directly to the Cancer Center and check in at the registration area.   Wear comfortable clothing and clothing appropriate for easy access to any Portacath or PICC line.   We strive to give you quality time with your provider. You may need to reschedule your appointment if you arrive late (15 or more minutes).  Arriving late affects you and other patients whose appointments are after yours.  Also, if you miss three or more appointments without notifying the office, you may be dismissed from the clinic at the provider's discretion.      For prescription refill requests, have your pharmacy contact our office and allow 72 hours for refills to be completed.    Today you received the following chemotherapy and/or immunotherapy agents velcade, cytoxan      To help prevent nausea and vomiting after your treatment, we encourage you to take your nausea medication as directed.  BELOW ARE SYMPTOMS THAT SHOULD BE REPORTED IMMEDIATELY: *FEVER GREATER THAN 100.4 F (38 C) OR HIGHER *CHILLS OR SWEATING *NAUSEA AND VOMITING THAT IS NOT CONTROLLED WITH YOUR NAUSEA MEDICATION *UNUSUAL SHORTNESS OF BREATH *UNUSUAL BRUISING OR BLEEDING *URINARY PROBLEMS (pain or burning when urinating, or frequent urination) *BOWEL PROBLEMS (unusual diarrhea, constipation, pain near the anus) TENDERNESS IN MOUTH AND THROAT WITH OR WITHOUT PRESENCE OF ULCERS (sore throat, sores in mouth, or a toothache) UNUSUAL RASH, SWELLING OR PAIN  UNUSUAL VAGINAL DISCHARGE OR ITCHING   Items with * indicate a potential emergency and should be followed up as soon as possible or go to the Emergency Department if any problems should occur.  Please show the CHEMOTHERAPY ALERT CARD or IMMUNOTHERAPY ALERT CARD at  check-in to the Emergency Department and triage nurse.  Should you have questions after your visit or need to cancel or reschedule your appointment, please contact [facility]COL[facility]  Dept: [phone]  and follow the prompts.  Office hours are 8:00 a.m. to 4:30 p.m. Monday - Friday. Please note that voicemails left after 4:00 p.m. may not be returned until the following business day.  We are closed weekends and major holidays. You have access to a nurse at all times for urgent questions. Please call the main number to the clinic Dept: [phone] and follow the prompts.   For any non-urgent questions, you may also contact your provider using MyChart. We now offer e-Visits for anyone 18 and older to request care online for non-urgent symptoms. For details visit [email or web address].   Also download the MyChart app! Go to the app store, search "MyChart", open the app, select [facility], and log in with your MyChart username and password.  Masks are optional in the cancer centers. If you would like for your care team to wear a mask while they are taking care of you, please let them know. For doctor visits, patients may have with them one support person who is at least 67 years old. At this time, visitors are not allowed in the infusion area. 

## 2021-06-26 NOTE — Progress Notes (Signed)
Per Dr. Lorenso Courier, ok to treat with elevated Creatinine and elevated heart rate.

## 2021-06-26 NOTE — Progress Notes (Unsigned)
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  Serological testing concerning for a plasma cell neoplasm, with UPEP showing 7.4 g of protein daily with markedly high lambda light chains -11/07/2020: Bone marrow biopsy performed, shows no evidence of multiple myeloma -11/23/2020: Biopsy of right clavicle mass confirmed plasmacytoma.  -11/27/2020: PET scan show intensely hypermetabolic large soft tissue masses arising from the LEFT and RIGHT ribs. Two large lesions in the LEFT chest wall and a single large lesion in the RIGHT chest wall.Hypermetabolic expansile solid masses arising from the medial RIGHT clavicle and RIGHT inferior pubic ischium 01/09/2021: Cycle 1, Day 1 CyBorD 02/06/2021: Cycle 2, Day 1 CyBorD 03/06/2021: Cycle 3, Day 1 CyBorD 04/03/2021: Cycle 4, Day 1 CyBorD  05/01/2021: Cycle 5, Day 1 CyBorD  05/29/2021: Cycle 6, Day 1 CyBorD   Interval History:  Jordan Gregory 66 y.o. 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 The patient was last seen on 05/29/2021. She is here for Cycle 6, Day 15 of CyBorD.   On exam today Jordan Gregory reports she is switched to Lyrica in order to help the bandlike pain she is experiencing across her abdomen to her back.  She notes that as the steroids were off she has progressively more difficult with this pain.  She reports that she is not feeling there is been any great  change in the Lyrica but she has been able to sleep better.  She reports that she is prone to episodes of lightheadedness and dizziness.  She is had several episodes where she had to bring her self to the ground due to the symptoms.  She notes that she also had an episode where she was experiencing nausea.  Today she reports that she is not as dizzy but does continue to struggle with fatigue.  She denies fevers, chills, night sweats, shortness of breath, chest pain or cough.  She has no other complaints.  Rest of the 10 point ROS is below.  We discussed the bone marrow transplant in greater detail.  She noted that she felt like it would be too intensive for her with her current state of health and she would prefer to transition to maintenance therapy when the time comes.  She is rapidly approaching that point we discussed that today.  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[place])    Thyroid disease    Vitamin D deficiency     SURGICAL HISTORY: Past Surgical History:  Procedure Laterality Date   MOHS SURGERY  2016    SOCIAL HISTORY: Social History   Socioeconomic History   Marital status: Married    Spouse name: Not on file   Number of children: Not on file   Years of education: Not on file   Highest education level: Not on file  Occupational History   Not on file  Tobacco Use   Smoking status: Never   Smokeless tobacco: Never  Vaping Use  Vaping Use: Never used  Substance and Sexual Activity   Alcohol use: Yes    Alcohol/week: 5.0 standard drinks of alcohol    Types: 5 Standard drinks or equivalent per week    Comment: regular   Drug use: No   Sexual activity: Yes    Birth control/protection: Post-menopausal    Comment: 1st intercourse 53 yo-5 partners  Other Topics Concern   Not on file  Social History Narrative   She works at [employer] and [employer].    Married - 23 years    No kids       She likes to travel,  read, cycle, be outside.    Social Determinants of Health   Financial Resource Strain: Not on file  Food Insecurity: Not on file  Transportation Needs: No Transportation Needs (10/18/2020)   PRAPARE - Hydrologist (Medical): No    Lack of Transportation (Non-Medical): No  Physical Activity: Not on file  Stress: Not on file  Social Connections: Not on file  Intimate Partner Violence: Not At Risk (12/04/2020)   Humiliation, Afraid, Rape, and Kick questionnaire    Fear of Current or Ex-Partner: No    Emotionally Abused: No    Physically Abused: No    Sexually Abused: No    FAMILY HISTORY: Family History  Problem Relation Age of Onset   Arthritis Mother    Hypertension Mother    Dementia Mother    Heart disease Father    Liver cancer Father    Asperger's syndrome Brother    Atrial fibrillation Brother     ALLERGIES:  is allergic to ramipril.  MEDICATIONS:  Current Outpatient Medications  Medication Sig Dispense Refill   acyclovir (ZOVIRAX) 400 MG tablet TAKE 1 TABLET BY MOUTH TWICE A DAY 180 tablet 3   allopurinol (ZYLOPRIM) 300 MG tablet TAKE 1 TABLET BY MOUTH EVERY DAY 90 tablet 1   amLODipine (NORVASC) 10 MG tablet Take 10 mg by mouth daily.     calcium carbonate (OS-CAL) 600 MG TABS Take 600 mg by mouth daily.     citalopram (CELEXA) 10 MG tablet TAKE 1 TABLET BY MOUTH EVERY DAY 90 tablet 0   Dexamethasone 20 MG TABS Take 40 mg by mouth once a week. Once a week. 60 tablet 3   levothyroxine (SYNTHROID) 75 MCG tablet TAKE 1 TABLET BY MOUTH DAILY BEFORE BREAKFAST. 90 tablet 3   ondansetron (ZOFRAN) 8 MG tablet Take 1 tablet (8 mg total) by mouth every 8 (eight) hours as needed for nausea or vomiting. 60 tablet 2   oxyCODONE (ROXICODONE) 5 MG immediate release tablet Take 1 tablet (5 mg total) by mouth every 4 (four) hours as needed for severe pain. (Patient not taking: Reported on 06/12/2021) 15 tablet 0   pantoprazole (PROTONIX) 40 MG tablet Take  1 tablet (40 mg total) by mouth daily. 7 tablet 0   pregabalin (LYRICA) 25 MG capsule Take 1 capsule (25 mg total) by mouth 2 (two) times daily. 60 capsule 2   prochlorperazine (COMPAZINE) 10 MG tablet Take 1 tablet (10 mg total) by mouth every 6 (six) hours as needed for nausea or vomiting. 60 tablet 2   traZODone (DESYREL) 50 MG tablet TAKE HALF TO 1 TABLET BY MOUTH AT BEDTIME AS NEEDED FOR SLEEP (Patient taking differently: Having to take #2 at bedtime) 90 tablet 1   VITAMIN D, CHOLECALCIFEROL, PO Take 2,000 Int'l Units by mouth daily.     No current facility-administered medications  for this visit.    REVIEW OF SYSTEMS:   Constitutional: ( - ) fevers, ( - )  chills , ( - ) night sweats Eyes: ( - ) blurriness of vision, ( - ) double vision, ( - ) watery eyes Ears, nose, mouth, throat, and face: ( - ) mucositis, ( - ) sore throat Respiratory: ( - ) cough, ( - ) dyspnea, ( - ) wheezes Cardiovascular: ( - ) palpitation, ( - ) chest discomfort, ( - ) lower extremity swelling Gastrointestinal:  ( - ) nausea, ( - ) heartburn, ( + ) change in bowel habits Skin: ( - ) abnormal skin rashes Lymphatics: ( - ) new lymphadenopathy, ( - ) easy bruising Neurological: ( - ) numbness, ( - ) tingling, ( - ) new weaknesses Behavioral/Psych: ( - ) mood change, ( - ) new changes  All other systems were reviewed with the patient and are negative.  PHYSICAL EXAMINATION: ECOG PERFORMANCE STATUS: 1 - Symptomatic but completely ambulatory  Vitals:   06/26/21 1457  BP: 124/80  Pulse: (!) 112  Resp: 17  Temp: 97.9 F (36.6 C)  SpO2: 98%   Filed Weights   06/26/21 1457  Weight: 161 lb 4.8 oz (73.2 kg)    GENERAL: Well-appearing middle-age Caucasian female, alert, no distress and comfortable SKIN: skin color, texture, turgor are normal, no rashes or significant lesions EYES: conjunctiva are pink and non-injected, sclera clear CHEST: mass over right clavicle, firm but decreasing in size  LUNGS: clear  to auscultation and percussion with normal breathing effort HEART: regular rate & rhythm and no murmurs and no lower extremity edema Musculoskeletal: no cyanosis of digits and no clubbing  PSYCH: alert & oriented x 3, fluent speech NEURO: no focal motor/sensory deficits  LABORATORY DATA:  I have reviewed the data as listed    Latest Ref Rng & Units 06/26/2021    2:27 PM 06/19/2021    1:11 PM 06/12/2021    1:49 PM  CBC  WBC 4.0 - 10.5 K/uL 2.5  2.8  3.1   Hemoglobin 12.0 - 15.0 g/dL 8.5  8.5  8.9   Hematocrit 36.0 - 46.0 % 23.6  25.5  24.5   Platelets 150 - 400 K/uL 143  161  167        Latest Ref Rng & Units 06/26/2021    2:27 PM 06/19/2021    1:23 PM 06/12/2021    1:49 PM  CMP  Glucose 70 - 99 mg/dL 136  149  197   BUN 8 - 23 mg/dL 16  17  16    Creatinine 0.44 - 1.00 mg/dL 1.55  1.75  1.83   Sodium 135 - 145 mmol/L 138  139  139   Potassium 3.5 - 5.1 mmol/L 3.7  3.8  3.8   Chloride 98 - 111 mmol/L 107  107  107   CO2 22 - 32 mmol/L 20  19  18    Calcium 8.9 - 10.3 mg/dL 9.2  9.5  9.4   Total Protein 6.5 - 8.1 g/dL 6.8  6.5  6.8   Total Bilirubin 0.3 - 1.2 mg/dL 0.8  0.6  0.7   Alkaline Phos 38 - 126 U/L 52  50  54   AST 15 - 41 U/L 22  13  23    ALT 0 - 44 U/L 20  12  20      Lab Results  Component Value Date   MPROTEIN Not Observed 06/19/2021   MPROTEIN Not Observed  05/22/2021   MPROTEIN Not Observed 04/24/2021   Lab Results  Component Value Date   KPAFRELGTCHN 9.5 06/19/2021   KPAFRELGTCHN 8.4 05/22/2021   KPAFRELGTCHN 8.3 04/24/2021   LAMBDASER 21.5 06/19/2021   LAMBDASER 16.1 05/22/2021   LAMBDASER 17.9 04/24/2021   KAPLAMBRATIO 0.44 06/19/2021   KAPLAMBRATIO 2.79 06/19/2021   KAPLAMBRATIO 0.52 05/22/2021     RADIOGRAPHIC STUDIES: CT CHEST ABDOMEN PELVIS WO CONTRAST  Result Date: 06/06/2021 CLINICAL DATA:  Persistent back pain.  History of multiple myeloma. EXAM: CT CHEST, ABDOMEN AND PELVIS WITHOUT CONTRAST TECHNIQUE: Multidetector CT imaging of the chest,  abdomen and pelvis was performed following the standard protocol without IV contrast. RADIATION DOSE REDUCTION: This exam was performed according to the departmental dose-optimization program which includes automated exposure control, adjustment of the mA and/or kV according to patient size and/or use of iterative reconstruction technique. COMPARISON:  CT abdomen/pelvis 03/18/2021.  PET-CT 11/27/2020 FINDINGS: CT CHEST FINDINGS Cardiovascular: The heart is normal in size. No pericardial effusion. The aorta is normal in caliber. No coronary artery calcifications. Mediastinum/Nodes: No mediastinal or hilar mass or lymphadenopathy. The esophagus is grossly normal. Lungs/Pleura: No acute pulmonary findings. No worrisome pulmonary lesions. No pleural effusions or pleural lesions. Musculoskeletal: Again demonstrated are multiple myelomatous bone lesions most notably involving the right clavicular head, on the right seventh rib, the left second rib and the left fourth rib. There were a large soft tissue masses associated with these lesions on the prior PET-CT but these have completely resolved. No new soft tissue mass is identified. No new bone lesions. CT ABDOMEN PELVIS FINDINGS Hepatobiliary: Small right hepatic lobe cyst. No worrisome hepatic lesions without contrast. The gallbladder is grossly normal. No common bile duct dilatation. Pancreas: No mass, inflammation or ductal dilatation. Spleen: Normal size.  No focal lesions. Adrenals/Urinary Tract: The adrenal glands are normal. No renal lesions, renal calculi or hydronephrosis. The bladder is unremarkable. Stomach/Bowel: Stomach, duodenum, small bowel and colon are unremarkable. No acute inflammatory changes, mass lesions or obstructive findings. The terminal ileum and appendix are normal. Vascular/Lymphatic: The aorta is normal in caliber. No atheroscerlotic calcifications. No mesenteric of retroperitoneal mass or adenopathy. Small scattered lymph nodes are noted.  Reproductive: The uterus and ovaries are unremarkable. Stable calcified uterine fibroids. Other: No pelvic mass or adenopathy. No free pelvic fluid collections. No inguinal mass or adenopathy. No abdominal wall hernia or subcutaneous lesions. Musculoskeletal: Stable myelomatous lesions involving the pelvis. I do not see any spinal lesions to account for the patient's back pain. The lytic lesion involving the right ischial tuberosity appears stable. No associated soft tissue component outside the bone. No new pelvic bone lesions. No stress fracture or AVN. Pubic symphysis and SI joints are intact. Stable appearing moderate lumbar facet disease. IMPRESSION: 1. Stable myelomatous lesions involving the chest and pelvis. No new or progressive findings. 2. The soft tissue masses associated with the chest lesions on the prior PET-CT have completely resolved. No new soft tissue mass is identified. 3. No spinal lesions to account for the patient's back pain. Electronically Signed   By: Marijo Sanes M.D.   On: 06/06/2021 16:48    ASSESSMENT & PLAN Tarini Carrier [facility] 66 y.o. 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  Previously we discussed the diagnosis and treatment options for plasmacytomas without multiple myeloma. This includes radiation therapy verus chemotherapy. Dr. Lorenso Courier discussed case with Dr. Maylene Roes from [facility] and the recommendation was to proceed with  chemotherapy due to patients decline in renal function. Patient is scheduled for a consultation with Dr. Maylene Roes on 01/11/2021.   Given the patient's kidney dysfunction at this time the treatment of choice is CyBorD chemotherapy.  We talked about the expected side effects of this regimen and the scheduling.  The patient voiced her understanding of this plan moving forward.   The CyBorD regimen consists of bortezomib 1.5 mg/m2 on days 1, 8, 15, and 22.  Cyclophosphamide 300 mg/m2 IV is  administered on days 1, 8, 15, and 22.  Additionally the patient was taking p.o. dexamethasone 40 mg on days 1, 8, 15, and 22.  This is to be continued until the patient reaches a VGPR at which time we could consider transition to bortezomib maintenance.  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   --Serological studies are confirmatory of a plasma cell neoplasm with markedly high lambda light chains and proteinuria.  --Bone marrow biopsy from 11/07/2020 showed no evidence of multiple myeloma. Right clavicle biopsy from 11/23/2020 confirmed plasma cell neoplasm consistent with plasmacytomas.  --PET scan from 11/27/2020 showed multifocal hypermetabolic soft tissue massing arising from the left and right ribs, left and right chest wall, right clavicle and right inferior pubic ischium. Largest is solid mass in the anterior left chest wall measuring 10.2 x 9.9 cm.  --Recommended to initiate systemic chemotherapy due to acute renal dysfunction.  --Treatment of choice is CyBorD which started 01/10/2020.  --Weekly labs to check CBC, CMP and LDH. Check myeloma labs and UPEP monthly.  Plan: --Labs today were reviewed and adequate for treatment today. SPEP/IFE and sFLC pending today.  --Most recent M protein was undetectable on 05/22/2021 and lambda light chains improving to 16.1 with ratio of 0.52 (WNL) --Patient will proceed with Cycle 6, Day 15 of CyBorD today -- Patient has reached undetectable M protein in the blood, normalized serum free light chains.  We will order UPEP in order to assess for residual M protein.  If M protein is undetectable can consider transition to maintenance therapy. --RTC in 2 weeks Cycle 7, Day 1 with continued weekly treatment  #Neutropenia/Anemia: --Secondary to underlying malignancy. --Hgb 8.9 and ANC 2.9 today  #Renal dysfunction: --Secondary to underlying malignancy.  --Creatinine is 1.83,  improving --Continue to monitor closely.   #Low back pain with neuropathic pain to bilateral legs: --Likely secondary to lytic lesions --Back pain has not improved since completion of palliative radiation. --Has oxycodone 5 mg PRN but hesitant to take due to existing constipation --Recommend to obtain CT CAP to further evaluate  --Currently on gabapentin 300 mg once night to help with pain but unable to take during the day due to drowsiness. Will switch to Lyrica for attempt of better pain control and less sedative effect.  #Supportive Care -- port placement not required -- zofran 33m q8H PRN and compazine 170mPO q6H for nausea -- acyclovir 40019mO BID for VCZ prophylaxis -- stool softeners/laxatives for constipation as needed   No orders of the defined types were placed in this encounter.  All questions were answered. The patient knows to call the clinic with any problems, questions or concerns.  I have spent a total of 30 minutes minutes of face-to-face and non-face-to-face time, preparing to see the patient, performing a medically appropriate examination, counseling and educating the patient, ordering medications, documenting clinical information in the electronic health record,  and care coordination.   JohLedell PeoplesD  Department of Hematology/Oncology [place] [facility] Phone: [phone] Pager: [phone] Email: [email or web address]

## 2021-06-26 NOTE — Telephone Encounter (Signed)
Scheduled appt per 6/21 referral. Pt is aware of appt date and time. Pt is aware to arrive 15 mins prior to appt time and to bring and updated insurance card. Pt is aware of appt location.   

## 2021-06-27 ENCOUNTER — Inpatient Hospital Stay (HOSPITAL_BASED_OUTPATIENT_CLINIC_OR_DEPARTMENT_OTHER): Payer: 59 | Admitting: Internal Medicine

## 2021-06-27 VITALS — BP 121/77 | HR 105 | Temp 97.7°F

## 2021-06-27 DIAGNOSIS — G620 Drug-induced polyneuropathy: Secondary | ICD-10-CM | POA: Diagnosis not present

## 2021-06-27 DIAGNOSIS — M5414 Radiculopathy, thoracic region: Secondary | ICD-10-CM | POA: Diagnosis not present

## 2021-06-27 DIAGNOSIS — Z5111 Encounter for antineoplastic chemotherapy: Secondary | ICD-10-CM | POA: Diagnosis not present

## 2021-06-27 DIAGNOSIS — T451X5A Adverse effect of antineoplastic and immunosuppressive drugs, initial encounter: Secondary | ICD-10-CM | POA: Insufficient documentation

## 2021-06-28 ENCOUNTER — Telehealth: Payer: Self-pay | Admitting: Internal Medicine

## 2021-06-28 NOTE — Telephone Encounter (Signed)
Per 6/22 los called and left message for pt about appointment .  Detail and callback number was left.

## 2021-06-29 ENCOUNTER — Encounter: Payer: Self-pay | Admitting: Hematology and Oncology

## 2021-07-03 ENCOUNTER — Other Ambulatory Visit: Payer: Self-pay

## 2021-07-03 ENCOUNTER — Inpatient Hospital Stay: Payer: 59

## 2021-07-03 ENCOUNTER — Inpatient Hospital Stay: Payer: 59 | Admitting: Hematology and Oncology

## 2021-07-03 VITALS — BP 101/65 | HR 89 | Temp 98.3°F | Resp 17 | Wt 160.8 lb

## 2021-07-03 DIAGNOSIS — Z5111 Encounter for antineoplastic chemotherapy: Secondary | ICD-10-CM | POA: Diagnosis not present

## 2021-07-03 DIAGNOSIS — C9000 Multiple myeloma not having achieved remission: Secondary | ICD-10-CM

## 2021-07-03 DIAGNOSIS — E538 Deficiency of other specified B group vitamins: Secondary | ICD-10-CM

## 2021-07-03 LAB — CBC WITH DIFFERENTIAL (CANCER CENTER ONLY)
Abs Immature Granulocytes: 0.02 10*3/uL (ref 0.00–0.07)
Basophils Absolute: 0 10*3/uL (ref 0.0–0.1)
Basophils Relative: 1 %
Eosinophils Absolute: 0 10*3/uL (ref 0.0–0.5)
Eosinophils Relative: 1 %
HCT: 23.9 % — ABNORMAL LOW (ref 36.0–46.0)
Hemoglobin: 8.6 g/dL — ABNORMAL LOW (ref 12.0–15.0)
Immature Granulocytes: 1 %
Lymphocytes Relative: 5 %
Lymphs Abs: 0.1 10*3/uL — ABNORMAL LOW (ref 0.7–4.0)
MCH: 33.5 pg (ref 26.0–34.0)
MCHC: 36 g/dL (ref 30.0–36.0)
MCV: 93 fL (ref 80.0–100.0)
Monocytes Absolute: 0.1 10*3/uL (ref 0.1–1.0)
Monocytes Relative: 4 %
Neutro Abs: 2.6 10*3/uL (ref 1.7–7.7)
Neutrophils Relative %: 88 %
Platelet Count: 167 10*3/uL (ref 150–400)
RBC: 2.57 MIL/uL — ABNORMAL LOW (ref 3.87–5.11)
RDW: 13.4 % (ref 11.5–15.5)
WBC Count: 2.9 10*3/uL — ABNORMAL LOW (ref 4.0–10.5)
nRBC: 0 % (ref 0.0–0.2)

## 2021-07-03 LAB — CMP (CANCER CENTER ONLY)
ALT: 16 U/L (ref 0–44)
AST: 16 U/L (ref 15–41)
Albumin: 4.4 g/dL (ref 3.5–5.0)
Alkaline Phosphatase: 51 U/L (ref 38–126)
Anion gap: 13 (ref 5–15)
BUN: 16 mg/dL (ref 8–23)
CO2: 19 mmol/L — ABNORMAL LOW (ref 22–32)
Calcium: 9.5 mg/dL (ref 8.9–10.3)
Chloride: 108 mmol/L (ref 98–111)
Creatinine: 1.6 mg/dL — ABNORMAL HIGH (ref 0.44–1.00)
GFR, Estimated: 35 mL/min — ABNORMAL LOW (ref >60)
Glucose, Bld: 157 mg/dL — ABNORMAL HIGH (ref 70–99)
Potassium: 3.8 mmol/L (ref 3.5–5.1)
Sodium: 140 mmol/L (ref 135–145)
Total Bilirubin: 0.6 mg/dL (ref 0.3–1.2)
Total Protein: 6.4 g/dL — ABNORMAL LOW (ref 6.5–8.1)

## 2021-07-03 LAB — LACTATE DEHYDROGENASE: LDH: 175 U/L (ref 98–192)

## 2021-07-03 MED ORDER — CYANOCOBALAMIN 1000 MCG/ML IJ SOLN
1000.0000 ug | Freq: Once | INTRAMUSCULAR | Status: AC
  Administered 2021-07-03: 1000 ug via INTRAMUSCULAR
  Filled 2021-07-03: qty 1

## 2021-07-03 MED ORDER — SODIUM CHLORIDE 0.9 % IV SOLN: Freq: Once | INTRAVENOUS | Status: AC

## 2021-07-03 MED ORDER — PALONOSETRON HCL INJECTION 0.25 MG/5ML
0.2500 mg | Freq: Once | INTRAVENOUS | Status: AC
  Administered 2021-07-03: 0.25 mg via INTRAVENOUS
  Filled 2021-07-03: qty 5

## 2021-07-03 MED ORDER — SODIUM CHLORIDE 0.9 % IV SOLN
300.0000 mg/m2 | Freq: Once | INTRAVENOUS | Status: AC
  Administered 2021-07-03: 600 mg via INTRAVENOUS
  Filled 2021-07-03: qty 30

## 2021-07-03 NOTE — Progress Notes (Signed)
Per Dr. Lorenso Courier - okay to treat with elevated creatinine of 1.6 and elevated HR of 105. Holding velcade today.

## 2021-07-03 NOTE — Patient Instructions (Signed)
[facility]   Discharge Instructions: Thank you for choosing [facility] to provide your oncology and hematology care.   If you have a lab appointment with the [facility], please go directly to the [facility] and check in at the registration area.   Wear comfortable clothing and clothing appropriate for easy access to any Portacath or PICC line.   We strive to give you quality time with your provider. You may need to reschedule your appointment if you arrive late (15 or more minutes).  Arriving late affects you and other patients whose appointments are after yours.  Also, if you miss three or more appointments without notifying the office, you may be dismissed from the clinic at the provider's discretion.      For prescription refill requests, have your pharmacy contact our office and allow 72 hours for refills to be completed.    Today you received the following chemotherapy and/or immunotherapy agents: Cyclophosphamide (Cytoxan)       To help prevent nausea and vomiting after your treatment, we encourage you to take your nausea medication as directed.  BELOW ARE SYMPTOMS THAT SHOULD BE REPORTED IMMEDIATELY: *FEVER GREATER THAN 100.4 F (38 C) OR HIGHER *CHILLS OR SWEATING *NAUSEA AND VOMITING THAT IS NOT CONTROLLED WITH YOUR NAUSEA MEDICATION *UNUSUAL SHORTNESS OF BREATH *UNUSUAL BRUISING OR BLEEDING *URINARY PROBLEMS (pain or burning when urinating, or frequent urination) *BOWEL PROBLEMS (unusual diarrhea, constipation, pain near the anus) TENDERNESS IN MOUTH AND THROAT WITH OR WITHOUT PRESENCE OF ULCERS (sore throat, sores in mouth, or a toothache) UNUSUAL RASH, SWELLING OR PAIN  UNUSUAL VAGINAL DISCHARGE OR ITCHING   Items with * indicate a potential emergency and should be followed up as soon as possible or go to the Emergency Department if any problems should occur.  Please show the CHEMOTHERAPY ALERT CARD or IMMUNOTHERAPY ALERT  CARD at check-in to the Emergency Department and triage nurse.  Should you have questions after your visit or need to cancel or reschedule your appointment, please contact [facility]  Dept: [phone]  and follow the prompts.  Office hours are 8:00 a.m. to 4:30 p.m. Monday - Friday. Please note that voicemails left after 4:00 p.m. may not be returned until the following business day.  We are closed weekends and major holidays. You have access to a nurse at all times for urgent questions. Please call the main number to the clinic Dept: [phone] and follow the prompts.   For any non-urgent questions, you may also contact your provider using MyChart. We now offer e-Visits for anyone 57 and older to request care online for non-urgent symptoms. For details visit [email or web address].   Also download the MyChart app! Go to the app store, search "MyChart", open the app, select [facility], and log in with your MyChart username and password.  Masks are optional in the cancer centers. If you would like for your care team to wear a mask while they are taking care of you, please let them know. For doctor visits, patients may have with them one support person who is at least 67 years old. At this time, visitors are not allowed in the infusion area.

## 2021-07-08 ENCOUNTER — Ambulatory Visit (HOSPITAL_COMMUNITY)
Admission: RE | Admit: 2021-07-08 | Discharge: 2021-07-08 | Disposition: A | Discharge status: Home / Self Care | Payer: 59 | Source: Ambulatory Visit | Attending: Internal Medicine | Admitting: Internal Medicine

## 2021-07-08 DIAGNOSIS — T451X5A Adverse effect of antineoplastic and immunosuppressive drugs, initial encounter: Secondary | ICD-10-CM

## 2021-07-08 DIAGNOSIS — M5414 Radiculopathy, thoracic region: Secondary | ICD-10-CM

## 2021-07-08 DIAGNOSIS — G620 Drug-induced polyneuropathy: Secondary | ICD-10-CM | POA: Diagnosis present

## 2021-07-08 MED ORDER — GADOBUTROL 1 MMOL/ML IV SOLN
7.0000 mL | Freq: Once | INTRAVENOUS | Status: AC | PRN
Start: 2021-07-08 — End: 2021-07-08
  Administered 2021-07-08: 7 mL via INTRAVENOUS

## 2021-07-10 ENCOUNTER — Inpatient Hospital Stay: Payer: 59

## 2021-07-15 ENCOUNTER — Inpatient Hospital Stay: Payer: 59 | Attending: Physician Assistant

## 2021-07-15 DIAGNOSIS — Z5111 Encounter for antineoplastic chemotherapy: Secondary | ICD-10-CM | POA: Insufficient documentation

## 2021-07-15 DIAGNOSIS — D709 Neutropenia, unspecified: Secondary | ICD-10-CM | POA: Insufficient documentation

## 2021-07-15 DIAGNOSIS — Z808 Family history of malignant neoplasm of other organs or systems: Secondary | ICD-10-CM | POA: Insufficient documentation

## 2021-07-15 DIAGNOSIS — F1090 Alcohol use, unspecified, uncomplicated: Secondary | ICD-10-CM | POA: Insufficient documentation

## 2021-07-15 DIAGNOSIS — Z79899 Other long term (current) drug therapy: Secondary | ICD-10-CM | POA: Insufficient documentation

## 2021-07-15 DIAGNOSIS — C9030 Solitary plasmacytoma not having achieved remission: Secondary | ICD-10-CM | POA: Insufficient documentation

## 2021-07-15 DIAGNOSIS — D649 Anemia, unspecified: Secondary | ICD-10-CM | POA: Insufficient documentation

## 2021-07-15 DIAGNOSIS — M545 Low back pain, unspecified: Secondary | ICD-10-CM | POA: Insufficient documentation

## 2021-07-15 DIAGNOSIS — N289 Disorder of kidney and ureter, unspecified: Secondary | ICD-10-CM | POA: Insufficient documentation

## 2021-07-15 DIAGNOSIS — I959 Hypotension, unspecified: Secondary | ICD-10-CM | POA: Insufficient documentation

## 2021-07-15 DIAGNOSIS — Z780 Asymptomatic menopausal state: Secondary | ICD-10-CM | POA: Insufficient documentation

## 2021-07-17 ENCOUNTER — Inpatient Hospital Stay: Payer: 59

## 2021-07-17 ENCOUNTER — Inpatient Hospital Stay: Payer: 59 | Admitting: Physician Assistant

## 2021-07-17 ENCOUNTER — Inpatient Hospital Stay (HOSPITAL_BASED_OUTPATIENT_CLINIC_OR_DEPARTMENT_OTHER): Payer: 59 | Admitting: Hematology and Oncology

## 2021-07-17 ENCOUNTER — Other Ambulatory Visit: Payer: Self-pay

## 2021-07-17 VITALS — BP 103/66 | HR 89 | Temp 98.0°F | Resp 18 | Wt 160.0 lb

## 2021-07-17 DIAGNOSIS — C9000 Multiple myeloma not having achieved remission: Secondary | ICD-10-CM

## 2021-07-17 DIAGNOSIS — D649 Anemia, unspecified: Secondary | ICD-10-CM

## 2021-07-17 DIAGNOSIS — Z808 Family history of malignant neoplasm of other organs or systems: Secondary | ICD-10-CM | POA: Diagnosis not present

## 2021-07-17 DIAGNOSIS — Z780 Asymptomatic menopausal state: Secondary | ICD-10-CM | POA: Diagnosis not present

## 2021-07-17 DIAGNOSIS — M545 Low back pain, unspecified: Secondary | ICD-10-CM | POA: Diagnosis not present

## 2021-07-17 DIAGNOSIS — R7989 Other specified abnormal findings of blood chemistry: Secondary | ICD-10-CM | POA: Diagnosis not present

## 2021-07-17 DIAGNOSIS — F1090 Alcohol use, unspecified, uncomplicated: Secondary | ICD-10-CM | POA: Diagnosis not present

## 2021-07-17 DIAGNOSIS — Z5111 Encounter for antineoplastic chemotherapy: Secondary | ICD-10-CM | POA: Diagnosis not present

## 2021-07-17 DIAGNOSIS — N289 Disorder of kidney and ureter, unspecified: Secondary | ICD-10-CM | POA: Diagnosis not present

## 2021-07-17 DIAGNOSIS — C9001 Multiple myeloma in remission: Secondary | ICD-10-CM

## 2021-07-17 DIAGNOSIS — C9030 Solitary plasmacytoma not having achieved remission: Secondary | ICD-10-CM | POA: Diagnosis not present

## 2021-07-17 DIAGNOSIS — D709 Neutropenia, unspecified: Secondary | ICD-10-CM | POA: Diagnosis not present

## 2021-07-17 DIAGNOSIS — Z79899 Other long term (current) drug therapy: Secondary | ICD-10-CM | POA: Diagnosis not present

## 2021-07-17 DIAGNOSIS — I959 Hypotension, unspecified: Secondary | ICD-10-CM | POA: Diagnosis not present

## 2021-07-17 LAB — CBC WITH DIFFERENTIAL (CANCER CENTER ONLY)
Abs Immature Granulocytes: 0.01 10*3/uL (ref 0.00–0.07)
Basophils Absolute: 0 10*3/uL (ref 0.0–0.1)
Basophils Relative: 1 %
Eosinophils Absolute: 0 10*3/uL (ref 0.0–0.5)
Eosinophils Relative: 0 %
HCT: 25.3 % — ABNORMAL LOW (ref 36.0–46.0)
Hemoglobin: 8.8 g/dL — ABNORMAL LOW (ref 12.0–15.0)
Immature Granulocytes: 0 %
Lymphocytes Relative: 4 %
Lymphs Abs: 0.1 10*3/uL — ABNORMAL LOW (ref 0.7–4.0)
MCH: 32.7 pg (ref 26.0–34.0)
MCHC: 34.8 g/dL (ref 30.0–36.0)
MCV: 94.1 fL (ref 80.0–100.0)
Monocytes Absolute: 0.1 10*3/uL (ref 0.1–1.0)
Monocytes Relative: 2 %
Neutro Abs: 2.4 10*3/uL (ref 1.7–7.7)
Neutrophils Relative %: 93 %
Platelet Count: 157 10*3/uL (ref 150–400)
RBC: 2.69 MIL/uL — ABNORMAL LOW (ref 3.87–5.11)
RDW: 13.9 % (ref 11.5–15.5)
WBC Count: 2.6 10*3/uL — ABNORMAL LOW (ref 4.0–10.5)
nRBC: 0 % (ref 0.0–0.2)

## 2021-07-17 LAB — CMP (CANCER CENTER ONLY)
ALT: 15 U/L (ref 0–44)
AST: 17 U/L (ref 15–41)
Albumin: 4.4 g/dL (ref 3.5–5.0)
Alkaline Phosphatase: 54 U/L (ref 38–126)
Anion gap: 11 (ref 5–15)
BUN: 17 mg/dL (ref 8–23)
CO2: 19 mmol/L — ABNORMAL LOW (ref 22–32)
Calcium: 10 mg/dL (ref 8.9–10.3)
Chloride: 110 mmol/L (ref 98–111)
Creatinine: 1.76 mg/dL — ABNORMAL HIGH (ref 0.44–1.00)
GFR, Estimated: 31 mL/min — ABNORMAL LOW (ref >60)
Glucose, Bld: 160 mg/dL — ABNORMAL HIGH (ref 70–99)
Potassium: 3.8 mmol/L (ref 3.5–5.1)
Sodium: 140 mmol/L (ref 135–145)
Total Bilirubin: 0.9 mg/dL (ref 0.3–1.2)
Total Protein: 6.7 g/dL (ref 6.5–8.1)

## 2021-07-17 LAB — LACTATE DEHYDROGENASE: LDH: 160 U/L (ref 98–192)

## 2021-07-17 MED ORDER — PALONOSETRON HCL INJECTION 0.25 MG/5ML
0.2500 mg | Freq: Once | INTRAVENOUS | Status: AC
  Administered 2021-07-17: 0.25 mg via INTRAVENOUS
  Filled 2021-07-17: qty 5

## 2021-07-17 MED ORDER — SODIUM CHLORIDE 0.9 % IV SOLN
300.0000 mg/m2 | Freq: Once | INTRAVENOUS | Status: AC
  Administered 2021-07-17: 600 mg via INTRAVENOUS
  Filled 2021-07-17: qty 30

## 2021-07-17 MED ORDER — SODIUM CHLORIDE 0.9 % IV SOLN: Freq: Once | INTRAVENOUS | Status: AC

## 2021-07-17 NOTE — Progress Notes (Signed)
Changed treatment plan date and removed Velcade for 07/17/21 per MD/Nursing request. Patient is transitioning to Main Rev. Ok to discontinue CyBorD careplan. MD will cancel future infusion appts.  Raul Del [place], , BCPS, BCOP 07/17/2021 4:05 PM

## 2021-07-17 NOTE — Patient Instructions (Signed)
[facility]   Discharge Instructions: Thank you for choosing [facility] to provide your oncology and hematology care.   If you have a lab appointment with the [facility], please go directly to the [facility] and check in at the registration area.   Wear comfortable clothing and clothing appropriate for easy access to any Portacath or PICC line.   We strive to give you quality time with your provider. You may need to reschedule your appointment if you arrive late (15 or more minutes).  Arriving late affects you and other patients whose appointments are after yours.  Also, if you miss three or more appointments without notifying the office, you may be dismissed from the clinic at the provider's discretion.      For prescription refill requests, have your pharmacy contact our office and allow 72 hours for refills to be completed.    Today you received the following chemotherapy and/or immunotherapy agents: Cyclophosphamide (Cytoxan)       To help prevent nausea and vomiting after your treatment, we encourage you to take your nausea medication as directed.  BELOW ARE SYMPTOMS THAT SHOULD BE REPORTED IMMEDIATELY: *FEVER GREATER THAN 100.4 F (38 C) OR HIGHER *CHILLS OR SWEATING *NAUSEA AND VOMITING THAT IS NOT CONTROLLED WITH YOUR NAUSEA MEDICATION *UNUSUAL SHORTNESS OF BREATH *UNUSUAL BRUISING OR BLEEDING *URINARY PROBLEMS (pain or burning when urinating, or frequent urination) *BOWEL PROBLEMS (unusual diarrhea, constipation, pain near the anus) TENDERNESS IN MOUTH AND THROAT WITH OR WITHOUT PRESENCE OF ULCERS (sore throat, sores in mouth, or a toothache) UNUSUAL RASH, SWELLING OR PAIN  UNUSUAL VAGINAL DISCHARGE OR ITCHING   Items with * indicate a potential emergency and should be followed up as soon as possible or go to the Emergency Department if any problems should occur.  Please show the CHEMOTHERAPY ALERT CARD or IMMUNOTHERAPY ALERT  CARD at check-in to the Emergency Department and triage nurse.  Should you have questions after your visit or need to cancel or reschedule your appointment, please contact [facility]  Dept: [phone]  and follow the prompts.  Office hours are 8:00 a.m. to 4:30 p.m. Monday - Friday. Please note that voicemails left after 4:00 p.m. may not be returned until the following business day.  We are closed weekends and major holidays. You have access to a nurse at all times for urgent questions. Please call the main number to the clinic Dept: [phone] and follow the prompts.   For any non-urgent questions, you may also contact your provider using MyChart. We now offer e-Visits for anyone 48 and older to request care online for non-urgent symptoms. For details visit [email or web address].   Also download the MyChart app! Go to the app store, search "MyChart", open the app, select [facility], and log in with your MyChart username and password.  Masks are optional in the cancer centers. If you would like for your care team to wear a mask while they are taking care of you, please let them know. For doctor visits, patients may have with them one support Zakyra Kukuk who is at least 67 years old. At this time, visitors are not allowed in the infusion area.

## 2021-07-17 NOTE — Progress Notes (Unsigned)
[facility] has no inplanted port rerouted to the lab for blood draw

## 2021-07-17 NOTE — Progress Notes (Signed)
Per Dr. Lorenso Courier okay to treat with creatinine 1.76, and hold the velcade today.

## 2021-07-18 ENCOUNTER — Inpatient Hospital Stay (HOSPITAL_BASED_OUTPATIENT_CLINIC_OR_DEPARTMENT_OTHER): Payer: 59 | Admitting: Internal Medicine

## 2021-07-18 DIAGNOSIS — G620 Drug-induced polyneuropathy: Secondary | ICD-10-CM | POA: Diagnosis not present

## 2021-07-18 DIAGNOSIS — T451X5A Adverse effect of antineoplastic and immunosuppressive drugs, initial encounter: Secondary | ICD-10-CM

## 2021-07-18 LAB — KAPPA/LAMBDA LIGHT CHAINS
Kappa free light chain: 7.8 mg/L (ref 3.3–19.4)
Kappa, lambda light chain ratio: 0.24 — ABNORMAL LOW (ref 0.26–1.65)
Lambda free light chains: 32.3 mg/L — ABNORMAL HIGH (ref 5.7–26.3)

## 2021-07-18 MED ORDER — GABAPENTIN 300 MG PO CAPS: 300.0000 mg | ORAL_CAPSULE | Freq: Three times a day (TID) | ORAL | 1 refills | Status: DC

## 2021-07-18 NOTE — Progress Notes (Signed)
I connected with Jordan Gregory on 07/18/21 at 10:00 AM EDT by telephone visit and verified that I am speaking with the correct person using two identifiers.  I discussed the limitations, risks, security and privacy concerns of performing an evaluation and management service by telemedicine and the availability of in-person appointments. I also discussed with the patient that there may be a patient responsible charge related to this service. The patient expressed understanding and agreed to proceed.  Other persons participating in the visit and their role in the encounter:  n/a  Patient's location:  Home  Provider's location:  Office  Chief Complaint:  Chemotherapy-induced neuropathy ([place])  History of Present Ilness: Jordan Gregory describes very modest improvement in neuropathic symptoms since starting the gabapentin.  Velcade was stopped last month, last dose was on 06/26/21.  She does describe ongoing pins and needles, the sharp pain is what has improved somewhat.  Dosing 332m twice per day, she feels it "wears off" before the evening dose. 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 Multiple myeloma. Chest wall plasmacytoma. EXAM: MRI THORACIC WITHOUT AND WITH CONTRAST TECHNIQUE: Multiplanar and multiecho pulse sequences of the thoracic spine were obtained without and with intravenous contrast. CONTRAST:  731mGADAVIST GADOBUTROL 1 MMOL/ML IV SOLN COMPARISON:  PET scan 11/27/2020. CT of the chest and abdomen 06/06/2021. FINDINGS: Alignment:  No significant listhesis is present. Vertebrae: Areas of chronic fatty infiltration are present in the endplates anteriorly at T6-7 T8-9 and T9-10. Posterior hemangioma is are present at T6 and T9. No abnormal enhancement is present focal enhancement is present in what appears  to be a superior endplate Schmorl's noted T12. No other focal enhancement is present within the thoracic spine. The known myeloma lesion involving the right clavicular head heterogeneously enhances. The known rib lesions are not included on the scan. Cord:  Normal signal and morphology. Paraspinal and other soft tissues: Visualized lung fields are clear PD M scratched at the visualized lung fields and mediastinum are within normal limits. The upper abdomen is unremarkable. Disc levels: Shallow central disc protrusions are present at T6-7 and T10-11. No other focal disc protrusions are present. No significant central canal stenosis is present. The foramina are patent bilaterally. IMPRESSION: 1. No evidence for myeloma lesions in the thoracic spine. 2. Shallow central disc protrusions at T6-7 and T10-11 without significant stenosis. 3. Chronic fatty infiltration of the anterior endplates at T6S2-8nd T8J6-8nd T9-10. 4. The known myeloma lesion involving the right clavicular head heterogeneously enhances. No associated soft tissue abnormality is present. Electronically Signed   By: ChSan Morelle.D.   On: 07/09/2021 19:03    Assessment and Plan: Chemotherapy-induced neuropathy (HCOtoe Reviewed MRI t-spine which did note demonstrate any foci of neoplasm or plasmacytoma.    Clinically stable or slightly improved with gabapentin.  Recommended increasing dose to 30061mID, then adding further 300m12mr week until up to 600mg79m if needed.  We also discussed the possibility of a localized nerve(s) block with Dr. EichmDavy Piquellow Up Instructions: RTC in 1 month  I discussed the assessment and treatment plan with the patient.  The patient was provided an opportunity to ask questions and all were answered.  The patient agreed with the plan and demonstrated understanding of the instructions.    The patient was advised to call back or seek  an in-person evaluation if the symptoms worsen or if the condition  fails to improve as anticipated.  I provided 5-10 minutes of non-face-to-face time during this enocunter.  Zachary K Vaslow, MD   I provided 22 minutes of non face-to-face telephone visit time during this encounter, and > 50% was spent counseling as documented under my assessment & plan.   

## 2021-07-19 ENCOUNTER — Telehealth: Payer: Self-pay | Admitting: Pharmacist

## 2021-07-19 ENCOUNTER — Telehealth: Payer: Self-pay | Admitting: Pharmacy Technician

## 2021-07-19 ENCOUNTER — Other Ambulatory Visit: Payer: Self-pay | Admitting: *Deleted

## 2021-07-19 ENCOUNTER — Encounter: Payer: Self-pay | Admitting: Hematology and Oncology

## 2021-07-19 ENCOUNTER — Other Ambulatory Visit (HOSPITAL_COMMUNITY): Payer: Self-pay

## 2021-07-19 DIAGNOSIS — C9000 Multiple myeloma not having achieved remission: Secondary | ICD-10-CM

## 2021-07-19 MED ORDER — LENALIDOMIDE 10 MG PO CAPS: 10.0000 mg | ORAL_CAPSULE | Freq: Every day | ORAL | 0 refills | Status: DC

## 2021-07-19 NOTE — Telephone Encounter (Signed)
Oral Oncology Pharmacist Encounter  Received new prescription for Revlimid (lenalidomide) for the maintenance treatment of multiple myeloma, planned duration until disease progression or unacceptable drug toxicity.  CBC w/ Diff and CMP from 07/17/21 assessed, patient with Scr of 1.76 mg/dL (CrCl ~35 mL/min) - will confirm dose adjustments of Revlimid with MD for renal dysfunction.  Addendum 07/22/2021 - updated prescription sent for lower dose of Revlimid (5 mg daily for 21 days on, 7 off) to account for renal dysfunction. I have called and updated [employer] authorization number with new dose. Prescription has be re-directed to [facility] for dispensing.  Current medication list in Epic reviewed, no relevant/significant DDIs with Revlimid identified.  Evaluated chart and no patient barriers to medication adherence noted.   Oral Oncology Clinic will continue to follow for insurance authorization, copayment issues, initial counseling and start date.  Leron Croak, PharmD, BCPS, [facility] Hematology/Oncology Clinical Pharmacist [facility] and [facility] [phone] 07/19/2021 10:22 AM

## 2021-07-19 NOTE — Telephone Encounter (Signed)
Oral Oncology Patient Advocate Encounter  Prior Authorization for Lenalidomide has been approved.    PA# [id number] Effective dates: 07/19/2021 through 07/20/2022  Patient must fill at [facility].    Lady Deutscher, CPhT-Adv Pharmacy Patient Advocate Specialist [facility] Patient Advocate Team Direct Number: [phone]  Fax: [phone]

## 2021-07-19 NOTE — Telephone Encounter (Signed)
Oral Oncology Patient Advocate Encounter   Received notification that prior authorization for Lenalidomide is required.   PA submitted on 07/19/2021 Key [id number] Status is pending     Lady Deutscher, CPhT-Adv Pharmacy Patient Advocate Specialist [facility] Patient Advocate Team Direct Number: [phone]  Fax: [phone]

## 2021-07-22 ENCOUNTER — Other Ambulatory Visit: Payer: Self-pay | Admitting: *Deleted

## 2021-07-22 MED ORDER — LENALIDOMIDE 5 MG PO CAPS: 5.0000 mg | ORAL_CAPSULE | Freq: Every day | ORAL | 0 refills | Status: DC

## 2021-07-23 ENCOUNTER — Encounter: Payer: Self-pay | Admitting: Hematology and Oncology

## 2021-07-23 LAB — MULTIPLE MYELOMA PANEL, SERUM
Albumin SerPl Elph-Mcnc: 3.9 g/dL (ref 2.9–4.4)
Albumin/Glob SerPl: 1.9 — ABNORMAL HIGH (ref 0.7–1.7)
Alpha 1: 0.3 g/dL (ref 0.0–0.4)
Alpha2 Glob SerPl Elph-Mcnc: 0.6 g/dL (ref 0.4–1.0)
B-Globulin SerPl Elph-Mcnc: 0.9 g/dL (ref 0.7–1.3)
Gamma Glob SerPl Elph-Mcnc: 0.3 g/dL — ABNORMAL LOW (ref 0.4–1.8)
Globulin, Total: 2.1 g/dL — ABNORMAL LOW (ref 2.2–3.9)
IgA: 11 mg/dL — ABNORMAL LOW (ref 87–352)
IgG (Immunoglobin G), Serum: 343 mg/dL — ABNORMAL LOW (ref 586–1602)
IgM (Immunoglobulin M), Srm: 8 mg/dL — ABNORMAL LOW (ref 26–217)
Total Protein ELP: 6 g/dL (ref 6.0–8.5)

## 2021-07-23 NOTE — Progress Notes (Signed)
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  Serological testing concerning for a plasma cell neoplasm, with UPEP showing 7.4 g of protein daily with markedly high lambda light chains -11/07/2020: Bone marrow biopsy performed, shows no evidence of multiple myeloma -11/23/2020: Biopsy of right clavicle mass confirmed plasmacytoma.  -11/27/2020: PET scan show intensely hypermetabolic large soft tissue masses arising from the LEFT and RIGHT ribs. Two large lesions in the LEFT chest wall and a single large lesion in the RIGHT chest wall.Hypermetabolic expansile solid masses arising from the medial RIGHT clavicle and RIGHT inferior pubic ischium 01/09/2021: Cycle 1, Day 1 CyBorD 02/06/2021: Cycle 2, Day 1 CyBorD 03/06/2021: Cycle 3, Day 1 CyBorD 04/03/2021: Cycle 4, Day 1 CyBorD  05/01/2021: Cycle 5, Day 1 CyBorD  05/29/2021: Cycle 6, Day 1 CyBorD  06/26/2021: Cycle 7, Day 1 CyBorD   Interval History:  Duanne Guess 66 y.o. 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 The patient was last seen on 06/26/2021. She is here for Cycle 7, Day 15 of CyD.   On exam today Ms. Covault reports she has not noticed any improvement in neuropathy off Velcade.  She notes that the pain today continues around the waist and sometimes is so bad she is "in tears".  She notes that she does continue to have some neuropathy of the left  foot but that neuropathy is "the least of my worries".  She notes that she is not having any side effects as result of her continued treatment.  Her appetite has been good though her energy levels have been somewhat low.  She did have a dizzy spell on Monday.  She has some occasional bouts of constipation but has kept it under good control.  She denies fevers, chills, night sweats, shortness of breath, chest pain or cough.  She has no other complaints.  Rest of the 10 point ROS is below.  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[place])    Thyroid disease    Vitamin D deficiency     SURGICAL HISTORY: Past Surgical History:  Procedure Laterality Date   MOHS SURGERY  2016    SOCIAL HISTORY: Social History   Socioeconomic History   Marital status: Married    Spouse name: Not on file   Number of children: Not on file   Years of education: Not on file   Highest education level: Not on file  Occupational History   Not on file  Tobacco Use   Smoking status: Never   Smokeless tobacco: Never  Vaping Use   Vaping Use: Never used  Substance and Sexual Activity   Alcohol use: Yes    Alcohol/week: 5.0 standard drinks of alcohol    Types: 5 Standard drinks or equivalent per week    Comment: regular   Drug use: No   Sexual activity: Yes    Birth control/protection: Post-menopausal    Comment: 1st intercourse 63 yo-5 partners  Other Topics Concern   Not on file  Social History Narrative   She works at [employer] and [employer].    Married - 23 years    No kids       She likes to travel, read, cycle, be outside.    Social Determinants of Health   Financial Resource Strain: Not on file  Food Insecurity: Not on file  Transportation Needs: No Transportation Needs (10/18/2020)   PRAPARE - Hydrologist (Medical): No    Lack of Transportation (Non-Medical): No  Physical Activity: Not on file  Stress: Not  on file  Social Connections: Not on file  Intimate Partner Violence: Not At Risk (12/04/2020)   Humiliation, Afraid, Rape, and Kick questionnaire    Fear of Current or Ex-Partner: No    Emotionally Abused: No    Physically Abused: No    Sexually Abused: No    FAMILY HISTORY: Family History  Problem Relation Age of Onset   Arthritis Mother    Hypertension Mother    Dementia Mother    Heart disease Father    Liver cancer Father    Asperger's syndrome Brother    Atrial fibrillation Brother     ALLERGIES:  is allergic to ramipril.  MEDICATIONS:  Current Outpatient Medications  Medication Sig Dispense Refill   acyclovir (ZOVIRAX) 400 MG tablet TAKE 1 TABLET BY MOUTH TWICE A DAY 180 tablet 3   allopurinol (ZYLOPRIM) 300 MG tablet TAKE 1 TABLET BY MOUTH EVERY DAY 90 tablet 1   amLODipine (NORVASC) 10 MG tablet Take 10 mg by mouth daily.     calcium carbonate (OS-CAL) 600 MG TABS Take 600 mg by mouth daily.     citalopram (CELEXA) 10 MG tablet TAKE 1 TABLET BY MOUTH EVERY DAY 90 tablet 0   Dexamethasone 20 MG TABS Take 40 mg by mouth once a week. Once a week. 60 tablet 3   gabapentin (NEURONTIN) 300 MG capsule Take 1 capsule (300 mg total) by mouth 3 (three) times daily. 90 capsule 1   lenalidomide (REVLIMID) 5 MG capsule Take 1 capsule (5 mg total) by mouth daily. Take for 21 days on, 7 days off. Repeat every 28 days. [employer] Auth # [id number] Date Obtained 07/19/21 21 capsule 0   levothyroxine (SYNTHROID) 75 MCG tablet TAKE 1 TABLET BY MOUTH DAILY BEFORE BREAKFAST. 90 tablet 3   ondansetron (ZOFRAN) 8 MG tablet Take 1 tablet (8 mg total) by mouth every 8 (eight) hours as needed for nausea or vomiting. 60 tablet 2   oxyCODONE (ROXICODONE) 5 MG immediate release tablet Take 1 tablet (5 mg total) by mouth every 4 (four) hours as needed for severe pain. 15 tablet 0   pantoprazole (PROTONIX) 40 MG tablet Take 1 tablet (40 mg total) by mouth daily. 7 tablet 0   prochlorperazine (COMPAZINE) 10  MG tablet Take 1 tablet (10 mg total) by mouth every 6 (six) hours as needed for nausea or vomiting. 60 tablet 2   traZODone (DESYREL) 50 MG tablet TAKE HALF TO 1 TABLET BY MOUTH AT BEDTIME AS NEEDED FOR SLEEP (Patient taking differently: Having to take #2 at bedtime) 90 tablet 1   VITAMIN D, CHOLECALCIFEROL, PO Take 2,000 Int'l Units by mouth daily.     No current facility-administered medications for this visit.    REVIEW OF SYSTEMS:   Constitutional: ( - ) fevers, ( - )  chills , ( - ) night sweats Eyes: ( - ) blurriness of  vision, ( - ) double vision, ( - ) watery eyes Ears, nose, mouth, throat, and face: ( - ) mucositis, ( - ) sore throat Respiratory: ( - ) cough, ( - ) dyspnea, ( - ) wheezes Cardiovascular: ( - ) palpitation, ( - ) chest discomfort, ( - ) lower extremity swelling Gastrointestinal:  ( - ) nausea, ( - ) heartburn, ( + ) change in bowel habits Skin: ( - ) abnormal skin rashes Lymphatics: ( - ) new lymphadenopathy, ( - ) easy bruising Neurological: ( - ) numbness, ( - ) tingling, ( - ) new weaknesses Behavioral/Psych: ( - ) mood change, ( - ) new changes  All other systems were reviewed with the patient and are negative.  PHYSICAL EXAMINATION: ECOG PERFORMANCE STATUS: 1 - Symptomatic but completely ambulatory  There were no vitals filed for this visit.  There were no vitals filed for this visit.   GENERAL: Well-appearing middle-age Caucasian female, alert, no distress and comfortable SKIN: skin color, texture, turgor are normal, no rashes or significant lesions EYES: conjunctiva are pink and non-injected, sclera clear CHEST: mass over right clavicle, firm but decreasing in size  LUNGS: clear to auscultation and percussion with normal breathing effort HEART: regular rate & rhythm and no murmurs and no lower extremity edema Musculoskeletal: no cyanosis of digits and no clubbing  PSYCH: alert & oriented x 3, fluent speech NEURO: no focal motor/sensory  deficits  LABORATORY DATA:  I have reviewed the data as listed    Latest Ref Rng & Units 07/17/2021    2:17 PM 07/03/2021   11:35 AM 06/26/2021    2:27 PM  CBC  WBC 4.0 - 10.5 K/uL 2.6  2.9  2.5   Hemoglobin 12.0 - 15.0 g/dL 8.8  8.6  8.5   Hematocrit 36.0 - 46.0 % 25.3  23.9  23.6   Platelets 150 - 400 K/uL 157  167  143        Latest Ref Rng & Units 07/17/2021    2:17 PM 07/03/2021   11:35 AM 06/26/2021    2:27 PM  CMP  Glucose 70 - 99 mg/dL 160  157  136   BUN 8 - 23 mg/dL 17  16  16    Creatinine 0.44 - 1.00 mg/dL 1.76  1.60  1.55   Sodium 135 - 145 mmol/L 140  140  138   Potassium 3.5 - 5.1 mmol/L 3.8  3.8  3.7   Chloride 98 - 111 mmol/L 110  108  107   CO2 22 - 32 mmol/L 19  19  20    Calcium 8.9 - 10.3 mg/dL 10.0  9.5  9.2   Total Protein 6.5 - 8.1 g/dL 6.7  6.4  6.8   Total Bilirubin 0.3 - 1.2 mg/dL 0.9  0.6  0.8   Alkaline Phos 38 - 126 U/L 54  51  52   AST 15 - 41 U/L 17  16  22    ALT 0 - 44 U/L 15  16  20      Lab Results  Component Value Date   MPROTEIN Not Observed 06/19/2021   MPROTEIN Not Observed 05/22/2021   MPROTEIN Not Observed 04/24/2021   Lab Results  Component Value Date   KPAFRELGTCHN 7.8 07/17/2021   KPAFRELGTCHN 9.5 06/19/2021   KPAFRELGTCHN 8.4 05/22/2021   LAMBDASER 32.3 (H) 07/17/2021   LAMBDASER 21.5 06/19/2021   LAMBDASER 16.1 05/22/2021   KAPLAMBRATIO 0.24 (L) 07/17/2021   KAPLAMBRATIO 0.44 06/19/2021  KAPLAMBRATIO 2.79 06/19/2021     RADIOGRAPHIC STUDIES: MR THORACIC SPINE W WO CONTRAST  Result Date: 07/09/2021 CLINICAL DATA:  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 Multiple myeloma. Chest wall plasmacytoma. EXAM: MRI THORACIC WITHOUT AND WITH CONTRAST TECHNIQUE: Multiplanar and multiecho pulse sequences of the thoracic spine were obtained without and with intravenous contrast. CONTRAST:  62m GADAVIST GADOBUTROL 1 MMOL/ML IV SOLN COMPARISON:  PET scan 11/27/2020. CT of the chest and abdomen  06/06/2021. FINDINGS: Alignment:  No significant listhesis is present. Vertebrae: Areas of chronic fatty infiltration are present in the endplates anteriorly at T6-7 T8-9 and T9-10. Posterior hemangioma is are present at T6 and T9. No abnormal enhancement is present focal enhancement is present in what appears to be a superior endplate Schmorl's noted T12. No other focal enhancement is present within the thoracic spine. The known myeloma lesion involving the right clavicular head heterogeneously enhances. The known rib lesions are not included on the scan. Cord:  Normal signal and morphology. Paraspinal and other soft tissues: Visualized lung fields are clear PD M scratched at the visualized lung fields and mediastinum are within normal limits. The upper abdomen is unremarkable. Disc levels: Shallow central disc protrusions are present at T6-7 and T10-11. No other focal disc protrusions are present. No significant central canal stenosis is present. The foramina are patent bilaterally. IMPRESSION: 1. No evidence for myeloma lesions in the thoracic spine. 2. Shallow central disc protrusions at T6-7 and T10-11 without significant stenosis. 3. Chronic fatty infiltration of the anterior endplates at TJ2-8and TN8-6and T9-10. 4. The known myeloma lesion involving the right clavicular head heterogeneously enhances. No associated soft tissue abnormality is present. Electronically Signed   By: CSan MorelleM.D.   On: 07/09/2021 19:03    ASSESSMENT & PLAN EBuffalo89y.o. 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  Previously we discussed the diagnosis and treatment options for plasmacytomas without multiple myeloma. This includes radiation therapy verus chemotherapy. Dr. DLorenso Courierdiscussed case with Dr[facility] to proceed with chemotherapy due to patients decline in renal function. Patient is  scheduled for a consultation with Dr. CMaylene Roeson 01/11/2021.   Given the patient's kidney dysfunction at this time the treatment of choice is CyBorD chemotherapy.  We talked about the expected side effects of this regimen and the scheduling.  The patient voiced her understanding of this plan moving forward.   The CyBorD regimen consists of bortezomib 1.5 mg/m2 on days 1, 8, 15, and 22.  Cyclophosphamide 300 mg/m2 IV is administered on days 1, 8, 15, and 22.  Additionally the patient was taking p.o. dexamethasone 40 mg on days 1, 8, 15, and 22.  This is to be continued until the patient reaches a VGPR at which time we could consider transition to bortezomib maintenance.  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   --Serological studies are confirmatory of a plasma cell neoplasm with markedly high lambda light chains and proteinuria.  --Bone marrow biopsy from 11/07/2020 showed no evidence of multiple myeloma. Right clavicle biopsy from 11/23/2020 confirmed plasma cell neoplasm consistent with plasmacytomas.  --PET scan from 11/27/2020 showed multifocal hypermetabolic soft tissue massing arising from the left and right ribs, left and right chest wall, right clavicle and right inferior pubic ischium. Largest is solid mass in the anterior left chest wall measuring  10.2 x 9.9 cm.  --Recommended to initiate systemic chemotherapy due to acute renal dysfunction.  --Treatment of choice is CyBorD which started 01/10/2020.  Plan: --HOLD velcade due to concern for neuropathy in the abdomen. Case discussed with Dr. Mickeal Skinner of neurology.  --Labs today were reviewed and adequate for treatment today. SPEP/IFE and sFLC pending today.  --Most recent M protein was undetectable on 06/19/2021 and lambda light chains improving to 21.5 with ratio of 0.52 (WNL) --Patient will proceed with Cycle 7, Day 15 of CyD today -- Patient has reached  undetectable M protein in the blood, normalized serum free light chains.   UPEP showed M protein is undetectable, will transition to maintenance therapy. --maintenance therapy to consist of revlimid 5 mg PO daily 21 days on 7 days off.  --RTC in 2 weeks to assure smooth initial start of maintenance revlimid therapy   #Neutropenia/Anemia: --Secondary to underlying malignancy. --Hgb 8.6 and ANC 2.6 today  #Renal dysfunction: --Secondary to underlying malignancy.  --Creatinine is 1.60, improving --Continue to monitor closely.   #Low back pain with neuropathic pain to bilateral legs: --Back pain has not improved since completion of palliative radiation. --Has oxycodone 5 mg PRN but hesitant to take due to existing constipation --Currently on gabapentin 300 mg once night to help with pain but unable to take during the day due to drowsiness.  -- appreciate input of Dr. Mickeal Skinner in neurology.   #Supportive Care -- port placement not required -- zofran 4m q8H PRN and compazine 11mPO q6H for nausea -- acyclovir 40038mO BID for VCZ prophylaxis -- stool softeners/laxatives for constipation as needed  No orders of the defined types were placed in this encounter.  All questions were answered. The patient knows to call the clinic with any problems, questions or concerns.  I have spent a total of 30 minutes minutes of face-to-face and non-face-to-face time, preparing to see the patient, performing a medically appropriate examination, counseling and educating the patient, ordering medications, documenting clinical information in the electronic health record,  and care coordination.   JohLedell PeoplesD Department of Hematology/Oncology [address] [facility] Centerone: [phone]: [phone]: [email or web address]

## 2021-07-24 ENCOUNTER — Telehealth: Payer: Self-pay | Admitting: Pharmacy Technician

## 2021-07-24 ENCOUNTER — Inpatient Hospital Stay: Payer: 59

## 2021-07-24 NOTE — Telephone Encounter (Signed)
Oral Chemotherapy Pharmacist Encounter  I spoke with patient for overview of: Revlimid (lenalidomide) for the maintenance treatment of  multiple myeloma, planned duration until disease progression or unacceptable drug toxicity.  Counseled patient on administration, dosing, side effects, monitoring, drug-food interactions, safe handling, storage, and disposal.  Patient will take Revlimid 2m capsules, 1 capsule by mouth once daily, without regard to food, with a full glass of water.  Revlimid will be given 21 days on, 7 days off, repeat every 28 days.  Revlimid start date: patient to start once received from [facility] (likely ~07/29/21 or 07/30/21).   Adverse effects of Revlimid include but are not limited to: nausea, constipation, diarrhea, abdominal pain, rash, fatigue, and decreased blood counts.    Reviewed with patient importance of keeping a medication schedule and plan for any missed doses. No barriers to medication adherence identified.  Medication reconciliation performed and medication/allergy list updated.  Patient counseled on importance of daily aspirin 877mfor VTE prophylaxis. She stated she will pick this up and start taking it.  Insurance authorization for Revlimid has been obtained.  Revlimid prescription is being dispensed from [facility] as it is a limited distribution medication. Patient to call and set up shipment today through their pharmacy. Phone number provided ([phone] All questions answered.  Ms. HoHeinleoiced understanding and appreciation.   Medication education handout placed in mail for patient. Patient knows to call the office with questions or concerns. Oral Chemotherapy Clinic phone number provided to patient.   ReLeron CroakPharmD, BCPS, BCEdward W Sparrow Hospitalematology/Oncology Clinical Pharmacist [address] [phone]/19/2023 3:11 PM

## 2021-07-24 NOTE — Telephone Encounter (Signed)
Oral Oncology Patient Advocate Encounter  Was successful in securing patient a $12,000 grant from [facility] to provide copayment coverage for Lenalidomide (revlimid).  This will keep the out of pocket expense at $0.     [employer] ID: [id number]  I have spoken with the patient.   The billing information is as follows and has been shared with [facility].    RxBin: [id number] PCN: [id number] Member ID: [id number] Group ID: [id number] Dates of Eligibility: 06/24/2021 through 06/24/2022  Fund:  [place], CPhT-Adv Pharmacy Patient Advocate Specialist [place] Patient Advocate Team Direct Number: [phone]  Fax: [phone]

## 2021-07-31 ENCOUNTER — Inpatient Hospital Stay: Payer: 59

## 2021-07-31 ENCOUNTER — Inpatient Hospital Stay (HOSPITAL_BASED_OUTPATIENT_CLINIC_OR_DEPARTMENT_OTHER): Payer: 59 | Admitting: Physician Assistant

## 2021-07-31 VITALS — BP 96/65 | HR 85 | Temp 98.8°F | Resp 15 | Wt 161.9 lb

## 2021-07-31 DIAGNOSIS — I952 Hypotension due to drugs: Secondary | ICD-10-CM | POA: Diagnosis not present

## 2021-07-31 DIAGNOSIS — C9000 Multiple myeloma not having achieved remission: Secondary | ICD-10-CM

## 2021-07-31 DIAGNOSIS — Z5111 Encounter for antineoplastic chemotherapy: Secondary | ICD-10-CM | POA: Diagnosis not present

## 2021-07-31 DIAGNOSIS — C9001 Multiple myeloma in remission: Secondary | ICD-10-CM

## 2021-07-31 LAB — CMP (CANCER CENTER ONLY)
ALT: 13 U/L (ref 0–44)
AST: 15 U/L (ref 15–41)
Albumin: 4.1 g/dL (ref 3.5–5.0)
Alkaline Phosphatase: 52 U/L (ref 38–126)
Anion gap: 9 (ref 5–15)
BUN: 13 mg/dL (ref 8–23)
CO2: 23 mmol/L (ref 22–32)
Calcium: 8.9 mg/dL (ref 8.9–10.3)
Chloride: 108 mmol/L (ref 98–111)
Creatinine: 1.54 mg/dL — ABNORMAL HIGH (ref 0.44–1.00)
GFR, Estimated: 37 mL/min — ABNORMAL LOW (ref >60)
Glucose, Bld: 121 mg/dL — ABNORMAL HIGH (ref 70–99)
Potassium: 3.8 mmol/L (ref 3.5–5.1)
Sodium: 140 mmol/L (ref 135–145)
Total Bilirubin: 0.5 mg/dL (ref 0.3–1.2)
Total Protein: 6.1 g/dL — ABNORMAL LOW (ref 6.5–8.1)

## 2021-07-31 LAB — CBC WITH DIFFERENTIAL (CANCER CENTER ONLY)
Abs Immature Granulocytes: 0.02 10*3/uL (ref 0.00–0.07)
Basophils Absolute: 0 10*3/uL (ref 0.0–0.1)
Basophils Relative: 1 %
Eosinophils Absolute: 0.1 10*3/uL (ref 0.0–0.5)
Eosinophils Relative: 4 %
HCT: 24.9 % — ABNORMAL LOW (ref 36.0–46.0)
Hemoglobin: 8.7 g/dL — ABNORMAL LOW (ref 12.0–15.0)
Immature Granulocytes: 1 %
Lymphocytes Relative: 9 %
Lymphs Abs: 0.2 10*3/uL — ABNORMAL LOW (ref 0.7–4.0)
MCH: 33.2 pg (ref 26.0–34.0)
MCHC: 34.9 g/dL (ref 30.0–36.0)
MCV: 95 fL (ref 80.0–100.0)
Monocytes Absolute: 0.2 10*3/uL (ref 0.1–1.0)
Monocytes Relative: 9 %
Neutro Abs: 1.8 10*3/uL (ref 1.7–7.7)
Neutrophils Relative %: 76 %
Platelet Count: 149 10*3/uL — ABNORMAL LOW (ref 150–400)
RBC: 2.62 MIL/uL — ABNORMAL LOW (ref 3.87–5.11)
RDW: 13.4 % (ref 11.5–15.5)
WBC Count: 2.3 10*3/uL — ABNORMAL LOW (ref 4.0–10.5)
nRBC: 0 % (ref 0.0–0.2)

## 2021-07-31 LAB — LACTATE DEHYDROGENASE: LDH: 153 U/L (ref 98–192)

## 2021-07-31 NOTE — Progress Notes (Signed)
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  Serological testing concerning for a plasma cell neoplasm, with UPEP showing 7.4 g of protein daily with markedly high lambda light chains -11/07/2020: Bone marrow biopsy performed, shows no evidence of multiple myeloma -11/23/2020: Biopsy of right clavicle mass confirmed plasmacytoma.  -11/27/2020: PET scan show intensely hypermetabolic large soft tissue masses arising from the LEFT and RIGHT ribs. Two large lesions in the LEFT chest wall and a single large lesion in the RIGHT chest wall.Hypermetabolic expansile solid masses arising from the medial RIGHT clavicle and RIGHT inferior pubic ischium 01/09/2021: Cycle 1, Day 1 CyBorD 02/06/2021: Cycle 2, Day 1 CyBorD 03/06/2021: Cycle 3, Day 1 CyBorD 04/03/2021: Cycle 4, Day 1 CyBorD  05/01/2021: Cycle 5, Day 1 CyBorD  05/29/2021: Cycle 6, Day 1 CyBorD  06/26/2021: Cycle 7, Day 1 CyBorD  07/29/2021: Started maintenance Revlimid 5 mg once daily, 21 days on, 7 days off every 28 days.   Interval History:  Jordan Gregory 66 y.o. 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 The patient was last seen on 07/17/2021.  In the interim, she started maintenance Revlimid therapy.   On exam today Jordan Gregory reports persistent pain in the back that radiates around her waits. She is current taking gabapentin 300 mg TID but plans to  titrate to 600 mg TID as instructed by Dr. Mickeal Skinner. She continues to use a cane to help with ambulation. She reports having dizzy spells and a couple of episodes of near syncopal episodes. She is taking Norvasc 10 mg daily as prescribed by her nephrologist. She denies any nausea, vomiting or diarrhea. She has occasional episodes of constipation that is well controlled with stool softeners/laxatives as needed. She denies easy bruising or bleeding.  She denies fevers, chills, night sweats, shortness of breath, chest pain or cough.  She has no other complaints.  Rest of the 10 point ROS is below.  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[place])    Thyroid disease    Vitamin D deficiency     SURGICAL HISTORY: Past Surgical History:  Procedure Laterality Date   MOHS SURGERY  2016    SOCIAL HISTORY: Social History   Socioeconomic History   Marital status: Married    Spouse name: Not on file   Number of children: Not on file   Years of education: Not on file   Highest education level: Not on file  Occupational History   Not on file  Tobacco Use   Smoking status: Never   Smokeless tobacco: Never  Vaping Use   Vaping Use: Never used  Substance and Sexual Activity   Alcohol use: Yes    Alcohol/week: 5.0 standard drinks of alcohol    Types: 5 Standard drinks or equivalent per week    Comment: regular   Drug use: No   Sexual activity: Yes    Birth control/protection: Post-menopausal    Comment: 1st intercourse  72 yo-5 partners  Other Topics Concern   Not on file  Social History Narrative   She works at [employer] and [employer].    Married - 23 years    No kids       She likes to travel, read, cycle, be outside.    Social Determinants of Health   Financial Resource Strain: Not on file  Food Insecurity: Not on file  Transportation Needs: No Transportation Needs (10/18/2020)   PRAPARE - Hydrologist  (Medical): No    Lack of Transportation (Non-Medical): No  Physical Activity: Not on file  Stress: Not on file  Social Connections: Not on file  Intimate Partner Violence: Not At Risk (12/04/2020)   Humiliation, Afraid, Rape, and Kick questionnaire    Fear of Current or Ex-Partner: No    Emotionally Abused: No    Physically Abused: No    Sexually Abused: No    FAMILY HISTORY: Family History  Problem Relation Age of Onset   Arthritis Mother    Hypertension Mother    Dementia Mother    Heart disease Father    Liver cancer Father    Asperger's syndrome Brother    Atrial fibrillation Brother     ALLERGIES:  is allergic to ramipril.  MEDICATIONS:  Current Outpatient Medications  Medication Sig Dispense Refill   allopurinol (ZYLOPRIM) 300 MG tablet TAKE 1 TABLET BY MOUTH EVERY DAY 90 tablet 1   amLODipine (NORVASC) 10 MG tablet Take 10 mg by mouth daily.     aspirin EC 81 MG tablet Take 81 mg by mouth daily. Swallow whole.     calcium carbonate (OS-CAL) 600 MG TABS Take 600 mg by mouth daily.     citalopram (CELEXA) 10 MG tablet TAKE 1 TABLET BY MOUTH EVERY DAY 90 tablet 0   gabapentin (NEURONTIN) 300 MG capsule Take 1 capsule (300 mg total) by mouth 3 (three) times daily. 90 capsule 1   lenalidomide (REVLIMID) 5 MG capsule Take 1 capsule (5 mg total) by mouth daily. Take for 21 days on, 7 days off. Repeat every 28 days. [employer] Auth # [id number] Date Obtained 07/19/21 21 capsule 0   levothyroxine (SYNTHROID) 75 MCG tablet TAKE 1 TABLET BY MOUTH DAILY BEFORE BREAKFAST. 90 tablet 3   ondansetron (ZOFRAN) 8 MG tablet Take 1 tablet (8 mg total) by mouth every 8 (eight) hours as needed for nausea or vomiting. 60 tablet 2   oxyCODONE (ROXICODONE) 5 MG immediate release tablet Take 1 tablet (5 mg total) by mouth every 4 (four) hours as needed for severe pain. 15 tablet 0   prochlorperazine (COMPAZINE) 10 MG tablet Take 1 tablet (10 mg total) by mouth every 6 (six) hours as needed for nausea  or vomiting. 60 tablet 2   traZODone (DESYREL) 50 MG tablet TAKE HALF TO 1 TABLET BY MOUTH AT BEDTIME AS NEEDED FOR SLEEP (Patient taking differently: Having to take #2 at bedtime) 90 tablet 1   VITAMIN D, CHOLECALCIFEROL, PO Take 2,000 Int'l Units by mouth daily.     acyclovir (ZOVIRAX) 400 MG tablet TAKE 1 TABLET BY MOUTH TWICE A DAY (Patient not taking: Reported on 07/31/2021) 180 tablet 3   pantoprazole (PROTONIX) 40 MG tablet Take 1 tablet (40 mg total) by mouth daily. (Patient not taking: Reported on 07/31/2021) 7 tablet 0   No current facility-administered medications for this visit.    REVIEW OF SYSTEMS:   Constitutional: ( - ) fevers, ( - )  chills , ( - ) night sweats Eyes: ( - ) blurriness of vision, ( - ) double vision, ( - ) watery eyes Ears, nose, mouth, throat, and face: ( - ) mucositis, ( - ) sore throat Respiratory: ( - ) cough, ( - ) dyspnea, ( - ) wheezes Cardiovascular: ( - ) palpitation, ( - ) chest discomfort, ( - ) lower extremity swelling Gastrointestinal:  ( - ) nausea, ( - ) heartburn, ( + ) change in bowel habits Skin: ( - ) abnormal skin rashes Lymphatics: ( - ) new lymphadenopathy, ( - ) easy bruising Neurological: ( + ) numbness, ( - ) tingling, ( - ) new weaknesses Behavioral/Psych: ( - ) mood change, ( - ) new changes  All other systems were reviewed with the patient and are negative.  PHYSICAL EXAMINATION: ECOG PERFORMANCE STATUS: 1 - Symptomatic but completely ambulatory  Vitals:   07/31/21 1357  BP: 96/65  Pulse: 85  Resp: 15  Temp: 98.8 F (37.1 C)  SpO2: 100%    Filed Weights   07/31/21 1357  Weight: 161 lb 14.4 oz (73.4 kg)     GENERAL: Well-appearing middle-age Caucasian female, alert, no distress and comfortable SKIN: skin color, texture, turgor are normal, no rashes or significant lesions EYES: conjunctiva are pink and non-injected, sclera clear CHEST: mass over right clavicle, firm but decreasing in size  LUNGS: clear to  auscultation and percussion with normal breathing effort HEART: regular rate & rhythm and no murmurs and no lower extremity edema Musculoskeletal: no cyanosis of digits and no clubbing  PSYCH: alert & oriented x 3, fluent speech NEURO: no focal motor/sensory deficits  LABORATORY DATA:  I have reviewed the data as listed    Latest Ref Rng & Units 07/31/2021    1:23 PM 07/17/2021    2:17 PM 07/03/2021   11:35 AM  CBC  WBC 4.0 - 10.5 K/uL 2.3  2.6  2.9   Hemoglobin 12.0 - 15.0 g/dL 8.7  8.8  8.6   Hematocrit 36.0 - 46.0 % 24.9  25.3  23.9   Platelets 150 - 400 K/uL 149  157  167        Latest Ref Rng & Units 07/31/2021    1:23 PM 07/17/2021    2:17 PM 07/03/2021   11:35 AM  CMP  Glucose 70 - 99 mg/dL 121  160  157   BUN 8 - 23 mg/dL '13  17  16   ' Creatinine 0.44 - 1.00 mg/dL 1.54  1.76  1.60   Sodium 135 - 145 mmol/L 140  140  140   Potassium 3.5 - 5.1 mmol/L 3.8  3.8  3.8   Chloride 98 - 111 mmol/L 108  110  108   CO2 22 - 32 mmol/L '23  19  19   ' Calcium 8.9 - 10.3 mg/dL 8.9  10.0  9.5   Total Protein 6.5 - 8.1 g/dL 6.1  6.7  6.4   Total Bilirubin 0.3 - 1.2 mg/dL 0.5  0.9  0.6   Alkaline Phos 38 - 126 U/L 52  54  51   AST 15 - 41 U/L '15  17  16   ' ALT 0 - 44 U/L '13  15  16     ' Lab Results  Component Value Date   MPROTEIN Not Observed 07/17/2021   MPROTEIN Not Observed 06/19/2021   MPROTEIN Not Observed 05/22/2021   Lab Results  Component Value Date   KPAFRELGTCHN 7.8 07/17/2021  KPAFRELGTCHN 9.5 06/19/2021   KPAFRELGTCHN 8.4 05/22/2021   LAMBDASER 32.3 (H) 07/17/2021   LAMBDASER 21.5 06/19/2021   LAMBDASER 16.1 05/22/2021   KAPLAMBRATIO 0.24 (L) 07/17/2021   KAPLAMBRATIO 0.44 06/19/2021   KAPLAMBRATIO 2.79 06/19/2021     RADIOGRAPHIC STUDIES: MR THORACIC SPINE W WO CONTRAST  Result Date: 07/09/2021 CLINICAL DATA:  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 Multiple myeloma. Chest wall plasmacytoma. EXAM: MRI THORACIC  WITHOUT AND WITH CONTRAST TECHNIQUE: Multiplanar and multiecho pulse sequences of the thoracic spine were obtained without and with intravenous contrast. CONTRAST:  62m GADAVIST GADOBUTROL 1 MMOL/ML IV SOLN COMPARISON:  PET scan 11/27/2020. CT of the chest and abdomen 06/06/2021. FINDINGS: Alignment:  No significant listhesis is present. Vertebrae: Areas of chronic fatty infiltration are present in the endplates anteriorly at T6-7 T8-9 and T9-10. Posterior hemangioma is are present at T6 and T9. No abnormal enhancement is present focal enhancement is present in what appears to be a superior endplate Schmorl's noted T12. No other focal enhancement is present within the thoracic spine. The known myeloma lesion involving the right clavicular head heterogeneously enhances. The known rib lesions are not included on the scan. Cord:  Normal signal and morphology. Paraspinal and other soft tissues: Visualized lung fields are clear PD M scratched at the visualized lung fields and mediastinum are within normal limits. The upper abdomen is unremarkable. Disc levels: Shallow central disc protrusions are present at T6-7 and T10-11. No other focal disc protrusions are present. No significant central canal stenosis is present. The foramina are patent bilaterally. IMPRESSION: 1. No evidence for myeloma lesions in the thoracic spine. 2. Shallow central disc protrusions at T6-7 and T10-11 without significant stenosis. 3. Chronic fatty infiltration of the anterior endplates at TE9-5and TM8-4and T9-10. 4. The known myeloma lesion involving the right clavicular head heterogeneously enhances. No associated soft tissue abnormality is present. Electronically Signed   By: CSan MorelleM.D.   On: 07/09/2021 19:03    ASSESSMENT & PLAN [email or web address].o. 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  Previously we discussed the diagnosis and  treatment options for plasmacytomas without multiple myeloma. This includes radiation therapy verus chemotherapy. Dr. DLorenso Courierdiscussed case with Dr[facility] was to proceed with chemotherapy due to patients decline in renal function.  Given the patient's kidney dysfunction at this time the treatment of choice was CyBorD chemotherapy.  Patient received 7 cycles of CyBorD from 01/09/2021-06/26/2021.   Ms. HKamatransitioned to maintenance Revlimid 5 mg PO daily starting 07/29/2021.   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   --Serological studies are confirmatory of a plasma cell neoplasm with markedly high lambda light chains and proteinuria.  --Bone marrow biopsy from 11/07/2020 showed no evidence of multiple myeloma. Right clavicle biopsy from 11/23/2020 confirmed plasma cell neoplasm consistent with plasmacytomas.  --PET scan from 11/27/2020 showed multifocal hypermetabolic soft tissue massing arising from the left and right ribs, left and right chest wall, right clavicle and right inferior pubic ischium. Largest is solid mass in the anterior left chest wall measuring 10.2 x 9.9 cm.  --Recommended to initiate systemic chemotherapy due to acute renal dysfunction.  --Received 7 cycles of CyBorD from 01/09/2021/06/26/2021 --Starting 07/29/2021, started maintenance therapy revlimid 5 mg PO daily 21 days on 7 days off.  .  Plan: --Most recent M  protein was undetectable on 07/17/2021. Lambda light chain increased slightly to 32.3. Ratio 0.24.  --Labs today were reviewed and don't require any intervention.  --RTC in 4 weeks with labs  #Neutropenia/Anemia: --Secondary to underlying malignancy. --Hgb 8.7 and ANC 1.8 today  #Renal dysfunction: --Secondary to underlying malignancy.  --Creatinine is 1.54, improving --Continue to monitor closely.   #Low back pain with neuropathic pain to  bilateral legs: --Back pain has not improved since completion of palliative radiation. --Has oxycodone 5 mg PRN but hesitant to take due to existing constipation --Currently on gabapentin 300 mg TID with plans to increase 600 mg TID.  --Under the care of Dr. Mickeal Skinner in neurology.   #Hypotension: --Today's BP was 96/65.  --Patient reports dizzy spells and near syncopal episodes. --Currently on Norvasc 10 mg daily, prescribed by nephrologist.  --Recommend to hold Norvasc, check BP daily at home,and f/u with nephrologist (Dr. Gean Quint) if she needs to resume BP medication  #Supportive Care -- port placement not required -- zofran 73m q8H PRN and compazine 147mPO q6H for nausea -- acyclovir 40060mO BID for VCZ prophylaxis -- stool softeners/laxatives for constipation as needed  No orders of the defined types were placed in this encounter.  All questions were answered. The patient knows to call the clinic with any problems, questions or concerns.  I have spent a total of 30 minutes minutes of face-to-face and non-face-to-face time, preparing to see the patient, performing a medically appropriate examination, counseling and educating the patient, ordering medications, documenting clinical information in the electronic health record,  and care coordination.   IreDede Query-C Dept of Hematology and OncNikolaevsk [facility]Mad[facility] Systemone: [phone]

## 2021-08-01 ENCOUNTER — Telehealth: Payer: Self-pay | Admitting: Hematology and Oncology

## 2021-08-01 ENCOUNTER — Telehealth: Payer: Self-pay

## 2021-08-01 LAB — KAPPA/LAMBDA LIGHT CHAINS
Kappa free light chain: 12.6 mg/L (ref 3.3–19.4)
Kappa, lambda light chain ratio: 0.34 (ref 0.26–1.65)
Lambda free light chains: 37.2 mg/L — ABNORMAL HIGH (ref 5.7–26.3)

## 2021-08-01 NOTE — Telephone Encounter (Signed)
Can you reach [facility] and notify Dr. Gean Quint that we recommended to hold patient's Norvac that was prescribed by him due to hypotension and dizziness with patient.  She will be checking her BP at home but would appreciate his advise if he recommends to resume BP meds  Dr Keturah Barre office nurse notified and 7/26 office note faxed

## 2021-08-01 NOTE — Telephone Encounter (Signed)
Scheduled per 7/26 los, pt has been called and confirmed

## 2021-08-05 LAB — MULTIPLE MYELOMA PANEL, SERUM
Albumin SerPl Elph-Mcnc: 3.5 g/dL (ref 2.9–4.4)
Albumin/Glob SerPl: 1.9 — ABNORMAL HIGH (ref 0.7–1.7)
Alpha 1: 0.2 g/dL (ref 0.0–0.4)
Alpha2 Glob SerPl Elph-Mcnc: 0.6 g/dL (ref 0.4–1.0)
B-Globulin SerPl Elph-Mcnc: 0.8 g/dL (ref 0.7–1.3)
Gamma Glob SerPl Elph-Mcnc: 0.3 g/dL — ABNORMAL LOW (ref 0.4–1.8)
Globulin, Total: 1.9 g/dL — ABNORMAL LOW (ref 2.2–3.9)
IgA: 11 mg/dL — ABNORMAL LOW (ref 87–352)
IgG (Immunoglobin G), Serum: 315 mg/dL — ABNORMAL LOW (ref 586–1602)
IgM (Immunoglobulin M), Srm: 7 mg/dL — ABNORMAL LOW (ref 26–217)
Total Protein ELP: 5.4 g/dL — ABNORMAL LOW (ref 6.0–8.5)

## 2021-08-07 ENCOUNTER — Other Ambulatory Visit: Payer: 59

## 2021-08-07 ENCOUNTER — Ambulatory Visit: Payer: 59

## 2021-08-15 ENCOUNTER — Other Ambulatory Visit: Payer: Self-pay | Admitting: Hematology and Oncology

## 2021-08-15 ENCOUNTER — Inpatient Hospital Stay: Payer: 59 | Attending: Physician Assistant | Admitting: Internal Medicine

## 2021-08-15 DIAGNOSIS — C9000 Multiple myeloma not having achieved remission: Secondary | ICD-10-CM

## 2021-08-15 DIAGNOSIS — G620 Drug-induced polyneuropathy: Secondary | ICD-10-CM | POA: Diagnosis not present

## 2021-08-15 DIAGNOSIS — T451X5A Adverse effect of antineoplastic and immunosuppressive drugs, initial encounter: Secondary | ICD-10-CM | POA: Diagnosis not present

## 2021-08-15 NOTE — Progress Notes (Signed)
I connected with Jordan Gregory on 08/15/21 at 11:30 AM EDT by telephone visit and verified that I am speaking with the correct person using two identifiers.  I discussed the limitations, risks, security and privacy concerns of performing an evaluation and management service by telemedicine and the availability of in-person appointments. I also discussed with the patient that there may be a patient responsible charge related to this service. The patient expressed understanding and agreed to proceed.  Other persons participating in the visit and their role in the encounter:  n/a  Patient's location:  Home  Provider's location:  Office  Chief Complaint:  Chemotherapy-induced neuropathy ([place])  History of Present Ilness: Jordan Gregory describes continued mild improvement in neuropathic symptoms since prior discussion.  Now 6 weeks removed from velcade.  She does describe ongoing pins and needles in same location as prior, along chest wall and back.  Dosing gabapentin '300mg'$  three times per day, this has helped somewhat.  Assessment and Plan: Chemotherapy-induced neuropathy ([place])  Clinically stable or slightly improved with higher dose of gabapentin.  Recommended continuing '300mg'$  TID if tolerated.  We also discussed the possibility of a localized nerve(s) block with Dr. Davy Pique.  Follow Up Instructions: RTC as needed  I discussed the assessment and treatment plan with the patient.  The patient was provided an opportunity to ask questions and all were answered.  The patient agreed with the plan and demonstrated understanding of the instructions.    The patient was advised to call back or seek an in-person evaluation if the symptoms worsen or if the condition fails to improve as anticipated.  I provided 5-10 minutes of non-face-to-face time during this enocunter.  Ventura Sellers, MD   I provided 22 minutes of non face-to-face telephone visit time during this encounter, and > 50%  was spent counseling as documented under my assessment & plan.

## 2021-08-16 ENCOUNTER — Other Ambulatory Visit: Payer: Self-pay | Admitting: *Deleted

## 2021-08-16 DIAGNOSIS — C9000 Multiple myeloma not having achieved remission: Secondary | ICD-10-CM

## 2021-08-16 MED ORDER — LENALIDOMIDE 5 MG PO CAPS: ORAL_CAPSULE | ORAL | 0 refills | Status: DC

## 2021-09-06 ENCOUNTER — Inpatient Hospital Stay: Payer: 59 | Attending: Physician Assistant

## 2021-09-06 ENCOUNTER — Other Ambulatory Visit: Payer: Self-pay

## 2021-09-06 ENCOUNTER — Inpatient Hospital Stay (HOSPITAL_BASED_OUTPATIENT_CLINIC_OR_DEPARTMENT_OTHER): Payer: 59 | Admitting: Hematology and Oncology

## 2021-09-06 ENCOUNTER — Other Ambulatory Visit: Payer: Self-pay | Admitting: Adult Health

## 2021-09-06 VITALS — BP 98/80 | HR 82 | Temp 98.1°F | Resp 15 | Wt 165.5 lb

## 2021-09-06 DIAGNOSIS — M545 Low back pain, unspecified: Secondary | ICD-10-CM | POA: Diagnosis not present

## 2021-09-06 DIAGNOSIS — Z79899 Other long term (current) drug therapy: Secondary | ICD-10-CM | POA: Insufficient documentation

## 2021-09-06 DIAGNOSIS — G629 Polyneuropathy, unspecified: Secondary | ICD-10-CM | POA: Insufficient documentation

## 2021-09-06 DIAGNOSIS — D649 Anemia, unspecified: Secondary | ICD-10-CM | POA: Diagnosis not present

## 2021-09-06 DIAGNOSIS — C9000 Multiple myeloma not having achieved remission: Secondary | ICD-10-CM

## 2021-09-06 DIAGNOSIS — C9001 Multiple myeloma in remission: Secondary | ICD-10-CM

## 2021-09-06 DIAGNOSIS — D696 Thrombocytopenia, unspecified: Secondary | ICD-10-CM

## 2021-09-06 DIAGNOSIS — D709 Neutropenia, unspecified: Secondary | ICD-10-CM | POA: Diagnosis not present

## 2021-09-06 DIAGNOSIS — C9030 Solitary plasmacytoma not having achieved remission: Secondary | ICD-10-CM | POA: Insufficient documentation

## 2021-09-06 DIAGNOSIS — N289 Disorder of kidney and ureter, unspecified: Secondary | ICD-10-CM | POA: Insufficient documentation

## 2021-09-06 DIAGNOSIS — I959 Hypotension, unspecified: Secondary | ICD-10-CM | POA: Diagnosis not present

## 2021-09-06 DIAGNOSIS — F32A Depression, unspecified: Secondary | ICD-10-CM

## 2021-09-06 LAB — CMP (CANCER CENTER ONLY)
ALT: 11 U/L (ref 0–44)
AST: 14 U/L — ABNORMAL LOW (ref 15–41)
Albumin: 4.1 g/dL (ref 3.5–5.0)
Alkaline Phosphatase: 55 U/L (ref 38–126)
Anion gap: 6 (ref 5–15)
BUN: 23 mg/dL (ref 8–23)
CO2: 26 mmol/L (ref 22–32)
Calcium: 8.9 mg/dL (ref 8.9–10.3)
Chloride: 109 mmol/L (ref 98–111)
Creatinine: 1.71 mg/dL — ABNORMAL HIGH (ref 0.44–1.00)
GFR, Estimated: 32 mL/min — ABNORMAL LOW (ref >60)
Glucose, Bld: 116 mg/dL — ABNORMAL HIGH (ref 70–99)
Potassium: 3.8 mmol/L (ref 3.5–5.1)
Sodium: 141 mmol/L (ref 135–145)
Total Bilirubin: 0.4 mg/dL (ref 0.3–1.2)
Total Protein: 6 g/dL — ABNORMAL LOW (ref 6.5–8.1)

## 2021-09-06 LAB — CBC WITH DIFFERENTIAL (CANCER CENTER ONLY)
Abs Immature Granulocytes: 0 10*3/uL (ref 0.00–0.07)
Basophils Absolute: 0.1 10*3/uL (ref 0.0–0.1)
Basophils Relative: 2 %
Eosinophils Absolute: 0.3 10*3/uL (ref 0.0–0.5)
Eosinophils Relative: 12 %
HCT: 26.4 % — ABNORMAL LOW (ref 36.0–46.0)
Hemoglobin: 9.3 g/dL — ABNORMAL LOW (ref 12.0–15.0)
Immature Granulocytes: 0 %
Lymphocytes Relative: 17 %
Lymphs Abs: 0.4 10*3/uL — ABNORMAL LOW (ref 0.7–4.0)
MCH: 32.7 pg (ref 26.0–34.0)
MCHC: 35.2 g/dL (ref 30.0–36.0)
MCV: 93 fL (ref 80.0–100.0)
Monocytes Absolute: 0.3 10*3/uL (ref 0.1–1.0)
Monocytes Relative: 12 %
Neutro Abs: 1.2 10*3/uL — ABNORMAL LOW (ref 1.7–7.7)
Neutrophils Relative %: 57 %
Platelet Count: 99 10*3/uL — ABNORMAL LOW (ref 150–400)
RBC: 2.84 MIL/uL — ABNORMAL LOW (ref 3.87–5.11)
RDW: 12.7 % (ref 11.5–15.5)
WBC Count: 2.1 10*3/uL — ABNORMAL LOW (ref 4.0–10.5)
nRBC: 0 % (ref 0.0–0.2)

## 2021-09-06 LAB — LACTATE DEHYDROGENASE: LDH: 127 U/L (ref 98–192)

## 2021-09-06 NOTE — Progress Notes (Signed)
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  Serological testing concerning for a plasma cell neoplasm, with UPEP showing 7.4 g of protein daily with markedly high lambda light chains -11/07/2020: Bone marrow biopsy performed, shows no evidence of multiple myeloma -11/23/2020: Biopsy of right clavicle mass confirmed plasmacytoma.  -11/27/2020: PET scan show intensely hypermetabolic large soft tissue masses arising from the LEFT and RIGHT ribs. Two large lesions in the LEFT chest wall and a single large lesion in the RIGHT chest wall.Hypermetabolic expansile solid masses arising from the medial RIGHT clavicle and RIGHT inferior pubic ischium 01/09/2021: Cycle 1, Day 1 CyBorD 02/06/2021: Cycle 2, Day 1 CyBorD 03/06/2021: Cycle 3, Day 1 CyBorD 04/03/2021: Cycle 4, Day 1 CyBorD  05/01/2021: Cycle 5, Day 1 CyBorD  05/29/2021: Cycle 6, Day 1 CyBorD  06/26/2021: Cycle 7, Day 1 CyBorD  07/29/2021: Started maintenance Revlimid 5 mg once daily, 21 days on, 7 days off every 28 days.   Interval History:  [facility] 66 y.o. 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 The patient was last seen on 07/31/2021.  In the interim, she started maintenance Revlimid therapy.   On exam today [facility] reports he has had marked improvement in neuropathy in the interim since her last visit.  She reports that she continues to take  gabapentin which helps take the edge off.  She reports it is no longer "like a bee sting pain".  She notes that it is much improved though there is still some residual pain.  She notes that she did have an episode recently where she had a syncopal episode in a restaurant.  She notes that she did drink a bloody Mary at that time but lost balance and woke up on the ground looking up at people.  An EKG was performed by EMS and was decided not to bring her into the hospital.  She also reports some occasional bouts of unsteadiness and shakiness of her hands as well as dizziness.  She is also had a few episodes of vomiting without much forewarning.  She notes that she does still struggle with constipation but that she keeps it under control with over-the-counter stool softeners.   She denies fevers, chills, night sweats, shortness of breath, chest pain or cough.  She has no other complaints.  Rest of the 10 point ROS is below.  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[place])    Thyroid disease    Vitamin D deficiency     SURGICAL HISTORY: Past Surgical History:  Procedure Laterality Date   MOHS SURGERY  2016    SOCIAL HISTORY: Social History   Socioeconomic History   Marital status: Married    Spouse name: Not on file   Number of children: Not on file   Years of education: Not on file   Highest education level: Not on file  Occupational History   Not on file  Tobacco Use  Smoking status: Never   Smokeless tobacco: Never  Vaping Use   Vaping Use: Never used  Substance and Sexual Activity   Alcohol use: Yes    Alcohol/week: 5.0 standard drinks of alcohol    Types: 5 Standard drinks or equivalent per week    Comment: regular   Drug use: No   Sexual activity: Yes    Birth control/protection: Post-menopausal    Comment: 1st intercourse 48 yo-5 partners  Other Topics Concern   Not on file  Social History Narrative   She works  at [employer] and [employer].    Married - 23 years    No kids       She likes to travel, read, cycle, be outside.    Social Determinants of Health   Financial Resource Strain: Not on file  Food Insecurity: Not on file  Transportation Needs: No Transportation Needs (10/18/2020)   PRAPARE - Hydrologist (Medical): No    Lack of Transportation (Non-Medical): No  Physical Activity: Not on file  Stress: Not on file  Social Connections: Not on file  Intimate Partner Violence: Not At Risk (12/04/2020)   Humiliation, Afraid, Rape, and Kick questionnaire    Fear of Current or Ex-Partner: No    Emotionally Abused: No    Physically Abused: No    Sexually Abused: No    FAMILY HISTORY: Family History  Problem Relation Age of Onset   Arthritis Mother    Hypertension Mother    Dementia Mother    Heart disease Father    Liver cancer Father    Asperger's syndrome Brother    Atrial fibrillation Brother     ALLERGIES:  is allergic to ramipril.  MEDICATIONS:  Current Outpatient Medications  Medication Sig Dispense Refill   allopurinol (ZYLOPRIM) 300 MG tablet TAKE 1 TABLET BY MOUTH EVERY DAY 90 tablet 1   aspirin EC 81 MG tablet Take 81 mg by mouth daily. Swallow whole.     calcium carbonate (OS-CAL) 600 MG TABS Take 600 mg by mouth daily.     gabapentin (NEURONTIN) 300 MG capsule Take 1 capsule (300 mg total) by mouth 3 (three) times daily. 90 capsule 1   lenalidomide (REVLIMID) 5 MG capsule TAKE 1 CAPSULE BY MOUTH 1 TIME A DAY FOR 21 DAYS ON THEN 7 DAYS OFF [employer] Auth # [id number]  Date obtained 08/16/21 21 capsule 0   levothyroxine (SYNTHROID) 75 MCG tablet TAKE 1 TABLET BY MOUTH DAILY BEFORE BREAKFAST. 90 tablet 3   ondansetron (ZOFRAN) 8 MG tablet Take 1 tablet (8 mg total) by mouth every 8 (eight) hours as needed for nausea or vomiting. 60 tablet 2   prochlorperazine (COMPAZINE) 10 MG tablet Take 1 tablet (10 mg total) by mouth every 6 (six) hours as needed for  nausea or vomiting. 60 tablet 2   traZODone (DESYREL) 50 MG tablet TAKE HALF TO 1 TABLET BY MOUTH AT BEDTIME AS NEEDED FOR SLEEP (Patient taking differently: Having to take #2 at bedtime) 90 tablet 1   VITAMIN D, CHOLECALCIFEROL, PO Take 2,000 Int'l Units by mouth daily.     acyclovir (ZOVIRAX) 400 MG tablet TAKE 1 TABLET BY MOUTH TWICE A DAY (Patient not taking: Reported on 07/31/2021) 180 tablet 3   amLODipine (NORVASC) 10 MG tablet Take 10 mg by mouth daily. (Patient not taking: Reported on 09/06/2021)     citalopram (CELEXA) 10 MG tablet TAKE 1 TABLET BY MOUTH EVERY DAY 90 tablet 0   oxyCODONE (  ROXICODONE) 5 MG immediate release tablet Take 1 tablet (5 mg total) by mouth every 4 (four) hours as needed for severe pain. (Patient not taking: Reported on 09/06/2021) 15 tablet 0   No current facility-administered medications for this visit.    REVIEW OF SYSTEMS:   Constitutional: ( - ) fevers, ( - )  chills , ( - ) night sweats Eyes: ( - ) blurriness of vision, ( - ) double vision, ( - ) watery eyes Ears, nose, mouth, throat, and face: ( - ) mucositis, ( - ) sore throat Respiratory: ( - ) cough, ( - ) dyspnea, ( - ) wheezes Cardiovascular: ( - ) palpitation, ( - ) chest discomfort, ( - ) lower extremity swelling Gastrointestinal:  ( - ) nausea, ( - ) heartburn, ( + ) change in bowel habits Skin: ( - ) abnormal skin rashes Lymphatics: ( - ) new lymphadenopathy, ( - ) easy bruising Neurological: ( + ) numbness, ( - ) tingling, ( - ) new weaknesses Behavioral/Psych: ( - ) mood change, ( - ) new changes  All other systems were reviewed with the patient and are negative.  PHYSICAL EXAMINATION: ECOG PERFORMANCE STATUS: 1 - Symptomatic but completely ambulatory  Vitals:   09/06/21 1432  BP: 98/80  Pulse: 82  Resp: 15  Temp: 98.1 F (36.7 C)  SpO2: 99%    Filed Weights   09/06/21 1432  Weight: 165 lb 8 oz (75.1 kg)     GENERAL: Well-appearing middle-age Caucasian female, alert, no  distress and comfortable SKIN: skin color, texture, turgor are normal, no rashes or significant lesions EYES: conjunctiva are pink and non-injected, sclera clear CHEST: mass over right clavicle, firm but decreasing in size  LUNGS: clear to auscultation and percussion with normal breathing effort HEART: regular rate & rhythm and no murmurs and no lower extremity edema Musculoskeletal: no cyanosis of digits and no clubbing  PSYCH: alert & oriented x 3, fluent speech NEURO: no focal motor/sensory deficits  LABORATORY DATA:  I have reviewed the data as listed    Latest Ref Rng & Units 09/06/2021    1:55 PM 07/31/2021    1:23 PM 07/17/2021    2:17 PM  CBC  WBC 4.0 - 10.5 K/uL 2.1  2.3  2.6   Hemoglobin 12.0 - 15.0 g/dL 9.3  8.7  8.8   Hematocrit 36.0 - 46.0 % 26.4  24.9  25.3   Platelets 150 - 400 K/uL 99  149  157        Latest Ref Rng & Units 09/06/2021    1:55 PM 07/31/2021    1:23 PM 07/17/2021    2:17 PM  CMP  Glucose 70 - 99 mg/dL 116  121  160   BUN 8 - 23 mg/dL 23  13  17    Creatinine 0.44 - 1.00 mg/dL 1.71  1.54  1.76   Sodium 135 - 145 mmol/L 141  140  140   Potassium 3.5 - 5.1 mmol/L 3.8  3.8  3.8   Chloride 98 - 111 mmol/L 109  108  110   CO2 22 - 32 mmol/L 26  23  19    Calcium 8.9 - 10.3 mg/dL 8.9  8.9  10.0   Total Protein 6.5 - 8.1 g/dL 6.0  6.1  6.7   Total Bilirubin 0.3 - 1.2 mg/dL 0.4  0.5  0.9   Alkaline Phos 38 - 126 U/L 55  52  54   AST 15 - 41  U/L 14  15  17    ALT 0 - 44 U/L 11  13  15      Lab Results  Component Value Date   MPROTEIN Not Observed 07/31/2021   MPROTEIN Not Observed 07/17/2021   MPROTEIN Not Observed 06/19/2021   Lab Results  Component Value Date   KPAFRELGTCHN 12.6 07/31/2021   KPAFRELGTCHN 7.8 07/17/2021   KPAFRELGTCHN 9.5 06/19/2021   LAMBDASER 37.2 (H) 07/31/2021   LAMBDASER 32.3 (H) 07/17/2021   LAMBDASER 21.5 06/19/2021   KAPLAMBRATIO 0.34 07/31/2021   KAPLAMBRATIO 0.24 (L) 07/17/2021   KAPLAMBRATIO 0.44 06/19/2021      RADIOGRAPHIC STUDIES: No results found.  [facility] 66 y.o. 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  Previously we discussed the diagnosis and treatment options for plasmacytomas without multiple myeloma. This includes radiation therapy verus chemotherapy. Dr. Lorenso Courier discussed case with Dr. Maylene Roes from [facility] and the recommendation was to proceed with chemotherapy due to patients decline in renal function.  Given the patient's kidney dysfunction at this time the treatment of choice was CyBorD chemotherapy.  Patient received 7 cycles of CyBorD from 01/09/2021-06/26/2021.   [facility] transitioned to maintenance Revlimid 5 mg PO daily starting 07/29/2021.   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   --Serological studies are confirmatory of a plasma cell neoplasm with markedly high lambda light chains and proteinuria.  --Bone marrow biopsy from 11/07/2020 showed no evidence of multiple myeloma. Right clavicle biopsy from 11/23/2020 confirmed plasma cell neoplasm consistent with plasmacytomas.  --PET scan from 11/27/2020 showed multifocal hypermetabolic soft tissue massing arising from the left and right ribs, left and right chest wall, right clavicle and right inferior pubic ischium. Largest is solid mass in the anterior left chest wall measuring 10.2 x 9.9 cm.  --Recommended to initiate systemic chemotherapy due to acute renal dysfunction.  --Received 7 cycles of CyBorD from 01/09/2021 to 06/26/2021 --Starting 07/29/2021, started maintenance therapy revlimid 5 mg PO daily 21 days on 7 days off.  Plan: --Most recent M protein was undetectable on 07/31/2021. Lambda light chain increased slightly to 37.2. Ratio 0.34.  --Labs today were reviewed and don't require any intervention.  --RTC in 4 weeks with  labs  #Neutropenia/Anemia: --Secondary to underlying malignancy. --Hgb 9.3 and ANC 1.8 today  #Renal dysfunction: --Secondary to underlying malignancy.  --Creatinine is 1.71, improving --Continue to monitor closely.   #Low back pain with neuropathic pain to bilateral legs: --Back pain has not improved since completion of palliative radiation. --Has oxycodone 5 mg PRN but hesitant to take due to existing constipation --Currently on gabapentin 300 mg TID with plans to increase 600 mg TID.  --Under the care of Dr. Mickeal Skinner in neurology.   #Hypotension: --Today's BP was 96/65.  --Patient reports dizzy spells and near syncopal episodes. --Currently on Norvasc 10 mg daily, prescribed by nephrologist.  --Recommend to hold Norvasc, check BP daily at home,and f/u with nephrologist (Dr. Gean Quint) if she needs to resume BP medication  #Supportive Care -- port placement not required -- zofran 63m q8H PRN and compazine 160mPO q6H for nausea -- acyclovir 40070mO BID for VCZ prophylaxis -- stool softeners/laxatives for constipation as needed  No orders of the defined types were placed in this encounter.  All questions were answered. The patient knows to call the clinic with any problems, questions or concerns.  I  have spent a total of 30 minutes minutes of face-to-face and non-face-to-face time, preparing to see the patient, performing a medically appropriate examination, counseling and educating the patient, ordering medications, documenting clinical information in the electronic health record,  and care coordination.   Ledell Peoples, MD Department of Hematology/Oncology [place] at [facility] Phone: [phone] Pager: [phone] Email: [email or web address]

## 2021-09-09 ENCOUNTER — Encounter: Payer: Self-pay | Admitting: Hematology and Oncology

## 2021-09-10 LAB — KAPPA/LAMBDA LIGHT CHAINS
Kappa free light chain: 14.2 mg/L (ref 3.3–19.4)
Kappa, lambda light chain ratio: 1.3 (ref 0.26–1.65)
Lambda free light chains: 10.9 mg/L (ref 5.7–26.3)

## 2021-09-11 ENCOUNTER — Other Ambulatory Visit: Payer: Self-pay | Admitting: Hematology and Oncology

## 2021-09-11 ENCOUNTER — Other Ambulatory Visit: Payer: Self-pay | Admitting: *Deleted

## 2021-09-11 DIAGNOSIS — C9000 Multiple myeloma not having achieved remission: Secondary | ICD-10-CM

## 2021-09-11 LAB — MULTIPLE MYELOMA PANEL, SERUM
Albumin SerPl Elph-Mcnc: 3.6 g/dL (ref 2.9–4.4)
Albumin/Glob SerPl: 2.2 — ABNORMAL HIGH (ref 0.7–1.7)
Alpha 1: 0.2 g/dL (ref 0.0–0.4)
Alpha2 Glob SerPl Elph-Mcnc: 0.6 g/dL (ref 0.4–1.0)
B-Globulin SerPl Elph-Mcnc: 0.7 g/dL (ref 0.7–1.3)
Gamma Glob SerPl Elph-Mcnc: 0.2 g/dL — ABNORMAL LOW (ref 0.4–1.8)
Globulin, Total: 1.7 g/dL — ABNORMAL LOW (ref 2.2–3.9)
IgA: 9 mg/dL — ABNORMAL LOW (ref 87–352)
IgG (Immunoglobin G), Serum: 312 mg/dL — ABNORMAL LOW (ref 586–1602)
IgM (Immunoglobulin M), Srm: 11 mg/dL — ABNORMAL LOW (ref 26–217)
Total Protein ELP: 5.3 g/dL — ABNORMAL LOW (ref 6.0–8.5)

## 2021-09-11 MED ORDER — LENALIDOMIDE 5 MG PO CAPS: ORAL_CAPSULE | ORAL | 0 refills | Status: DC

## 2021-09-21 ENCOUNTER — Other Ambulatory Visit: Payer: Self-pay | Admitting: Adult Health

## 2021-09-21 DIAGNOSIS — G479 Sleep disorder, unspecified: Secondary | ICD-10-CM

## 2021-09-23 ENCOUNTER — Inpatient Hospital Stay: Payer: 59

## 2021-09-23 DIAGNOSIS — C9030 Solitary plasmacytoma not having achieved remission: Secondary | ICD-10-CM | POA: Diagnosis not present

## 2021-09-23 DIAGNOSIS — C9000 Multiple myeloma not having achieved remission: Secondary | ICD-10-CM

## 2021-09-23 LAB — CBC WITH DIFFERENTIAL (CANCER CENTER ONLY)
Abs Immature Granulocytes: 0 10*3/uL (ref 0.00–0.07)
Basophils Absolute: 0.1 10*3/uL (ref 0.0–0.1)
Basophils Relative: 5 %
Eosinophils Absolute: 0.1 10*3/uL (ref 0.0–0.5)
Eosinophils Relative: 6 %
HCT: 28.3 % — ABNORMAL LOW (ref 36.0–46.0)
Hemoglobin: 9.9 g/dL — ABNORMAL LOW (ref 12.0–15.0)
Immature Granulocytes: 0 %
Lymphocytes Relative: 26 %
Lymphs Abs: 0.5 10*3/uL — ABNORMAL LOW (ref 0.7–4.0)
MCH: 32.4 pg (ref 26.0–34.0)
MCHC: 35 g/dL (ref 30.0–36.0)
MCV: 92.5 fL (ref 80.0–100.0)
Monocytes Absolute: 0.3 10*3/uL (ref 0.1–1.0)
Monocytes Relative: 12 %
Neutro Abs: 1 10*3/uL — ABNORMAL LOW (ref 1.7–7.7)
Neutrophils Relative %: 51 %
Platelet Count: 129 10*3/uL — ABNORMAL LOW (ref 150–400)
RBC: 3.06 MIL/uL — ABNORMAL LOW (ref 3.87–5.11)
RDW: 13.6 % (ref 11.5–15.5)
WBC Count: 2 10*3/uL — ABNORMAL LOW (ref 4.0–10.5)
nRBC: 0 % (ref 0.0–0.2)

## 2021-09-23 LAB — CMP (CANCER CENTER ONLY)
ALT: 11 U/L (ref 0–44)
AST: 14 U/L — ABNORMAL LOW (ref 15–41)
Albumin: 4.1 g/dL (ref 3.5–5.0)
Alkaline Phosphatase: 61 U/L (ref 38–126)
Anion gap: 10 (ref 5–15)
BUN: 27 mg/dL — ABNORMAL HIGH (ref 8–23)
CO2: 23 mmol/L (ref 22–32)
Calcium: 9.1 mg/dL (ref 8.9–10.3)
Chloride: 107 mmol/L (ref 98–111)
Creatinine: 1.85 mg/dL — ABNORMAL HIGH (ref 0.44–1.00)
GFR, Estimated: 30 mL/min — ABNORMAL LOW (ref >60)
Glucose, Bld: 140 mg/dL — ABNORMAL HIGH (ref 70–99)
Potassium: 3.9 mmol/L (ref 3.5–5.1)
Sodium: 140 mmol/L (ref 135–145)
Total Bilirubin: 0.4 mg/dL (ref 0.3–1.2)
Total Protein: 6 g/dL — ABNORMAL LOW (ref 6.5–8.1)

## 2021-09-23 LAB — LACTATE DEHYDROGENASE: LDH: 128 U/L (ref 98–192)

## 2021-10-04 ENCOUNTER — Other Ambulatory Visit: Payer: Self-pay | Admitting: Hematology and Oncology

## 2021-10-07 ENCOUNTER — Inpatient Hospital Stay (HOSPITAL_BASED_OUTPATIENT_CLINIC_OR_DEPARTMENT_OTHER): Payer: 59 | Admitting: Hematology and Oncology

## 2021-10-07 ENCOUNTER — Other Ambulatory Visit: Payer: Self-pay

## 2021-10-07 ENCOUNTER — Inpatient Hospital Stay: Payer: 59 | Attending: Physician Assistant

## 2021-10-07 ENCOUNTER — Other Ambulatory Visit: Payer: Self-pay | Admitting: *Deleted

## 2021-10-07 ENCOUNTER — Other Ambulatory Visit: Payer: Self-pay | Admitting: Hematology and Oncology

## 2021-10-07 ENCOUNTER — Telehealth: Payer: Self-pay | Admitting: Hematology and Oncology

## 2021-10-07 VITALS — BP 123/74 | HR 73 | Temp 97.5°F | Resp 14 | Wt 164.2 lb

## 2021-10-07 DIAGNOSIS — D696 Thrombocytopenia, unspecified: Secondary | ICD-10-CM | POA: Diagnosis not present

## 2021-10-07 DIAGNOSIS — G629 Polyneuropathy, unspecified: Secondary | ICD-10-CM | POA: Insufficient documentation

## 2021-10-07 DIAGNOSIS — M545 Low back pain, unspecified: Secondary | ICD-10-CM | POA: Insufficient documentation

## 2021-10-07 DIAGNOSIS — C9001 Multiple myeloma in remission: Secondary | ICD-10-CM | POA: Diagnosis not present

## 2021-10-07 DIAGNOSIS — C9030 Solitary plasmacytoma not having achieved remission: Secondary | ICD-10-CM | POA: Insufficient documentation

## 2021-10-07 DIAGNOSIS — Z79899 Other long term (current) drug therapy: Secondary | ICD-10-CM | POA: Diagnosis not present

## 2021-10-07 DIAGNOSIS — I959 Hypotension, unspecified: Secondary | ICD-10-CM | POA: Diagnosis not present

## 2021-10-07 DIAGNOSIS — C9000 Multiple myeloma not having achieved remission: Secondary | ICD-10-CM

## 2021-10-07 DIAGNOSIS — D649 Anemia, unspecified: Secondary | ICD-10-CM

## 2021-10-07 DIAGNOSIS — N289 Disorder of kidney and ureter, unspecified: Secondary | ICD-10-CM | POA: Diagnosis not present

## 2021-10-07 DIAGNOSIS — Z780 Asymptomatic menopausal state: Secondary | ICD-10-CM | POA: Diagnosis not present

## 2021-10-07 LAB — CMP (CANCER CENTER ONLY)
ALT: 16 U/L (ref 0–44)
AST: 16 U/L (ref 15–41)
Albumin: 4.2 g/dL (ref 3.5–5.0)
Alkaline Phosphatase: 61 U/L (ref 38–126)
Anion gap: 8 (ref 5–15)
BUN: 27 mg/dL — ABNORMAL HIGH (ref 8–23)
CO2: 24 mmol/L (ref 22–32)
Calcium: 9 mg/dL (ref 8.9–10.3)
Chloride: 107 mmol/L (ref 98–111)
Creatinine: 1.7 mg/dL — ABNORMAL HIGH (ref 0.44–1.00)
GFR, Estimated: 33 mL/min — ABNORMAL LOW (ref >60)
Glucose, Bld: 101 mg/dL — ABNORMAL HIGH (ref 70–99)
Potassium: 3.9 mmol/L (ref 3.5–5.1)
Sodium: 139 mmol/L (ref 135–145)
Total Bilirubin: 0.6 mg/dL (ref 0.3–1.2)
Total Protein: 5.8 g/dL — ABNORMAL LOW (ref 6.5–8.1)

## 2021-10-07 LAB — CBC WITH DIFFERENTIAL (CANCER CENTER ONLY)
Abs Immature Granulocytes: 0 10*3/uL (ref 0.00–0.07)
Basophils Absolute: 0.1 10*3/uL (ref 0.0–0.1)
Basophils Relative: 4 %
Eosinophils Absolute: 0.3 10*3/uL (ref 0.0–0.5)
Eosinophils Relative: 15 %
HCT: 29 % — ABNORMAL LOW (ref 36.0–46.0)
Hemoglobin: 10.2 g/dL — ABNORMAL LOW (ref 12.0–15.0)
Immature Granulocytes: 0 %
Lymphocytes Relative: 27 %
Lymphs Abs: 0.5 10*3/uL — ABNORMAL LOW (ref 0.7–4.0)
MCH: 31.7 pg (ref 26.0–34.0)
MCHC: 35.2 g/dL (ref 30.0–36.0)
MCV: 90.1 fL (ref 80.0–100.0)
Monocytes Absolute: 0.2 10*3/uL (ref 0.1–1.0)
Monocytes Relative: 13 %
Neutro Abs: 0.7 10*3/uL — ABNORMAL LOW (ref 1.7–7.7)
Neutrophils Relative %: 41 %
Platelet Count: 117 10*3/uL — ABNORMAL LOW (ref 150–400)
RBC: 3.22 MIL/uL — ABNORMAL LOW (ref 3.87–5.11)
RDW: 13 % (ref 11.5–15.5)
WBC Count: 1.7 10*3/uL — ABNORMAL LOW (ref 4.0–10.5)
nRBC: 0 % (ref 0.0–0.2)

## 2021-10-07 LAB — LACTATE DEHYDROGENASE: LDH: 133 U/L (ref 98–192)

## 2021-10-07 MED ORDER — LENALIDOMIDE 2.5 MG PO CAPS: 2.5000 mg | ORAL_CAPSULE | Freq: Every day | ORAL | 0 refills | Status: DC

## 2021-10-07 NOTE — Progress Notes (Signed)
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  Serological testing concerning for a plasma cell neoplasm, with UPEP showing 7.4 g of protein daily with markedly high lambda light chains -11/07/2020: Bone marrow biopsy performed, shows no evidence of multiple myeloma -11/23/2020: Biopsy of right clavicle mass confirmed plasmacytoma.  -11/27/2020: PET scan show intensely hypermetabolic large soft tissue masses arising from the LEFT and RIGHT ribs. Two large lesions in the LEFT chest wall and a single large lesion in the RIGHT chest wall.Hypermetabolic expansile solid masses arising from the medial RIGHT clavicle and RIGHT inferior pubic ischium 01/09/2021: Cycle 1, Day 1 CyBorD 02/06/2021: Cycle 2, Day 1 CyBorD 03/06/2021: Cycle 3, Day 1 CyBorD 04/03/2021: Cycle 4, Day 1 CyBorD  05/01/2021: Cycle 5, Day 1 CyBorD  05/29/2021: Cycle 6, Day 1 CyBorD  06/26/2021: Cycle 7, Day 1 CyBorD  07/29/2021: Started maintenance Revlimid 5 mg once daily, 21 days on, 7 days off every 28 days.   Interval History:  Jordan Gregory 66 y.o. 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 The patient was last seen on 09/06/2021.  In the interim, she has continued maintenance Revlimid therapy.   On exam today Jordan Gregory reports her neuropathy has markedly improved in the interim since her last visit.  She reports that there are fewer tender  spots.  She notes that it is not affecting her anymore on a day-to-day basis.  She reports that she can go most days without gabapentin but does prefer to take it at night to help her sleep.  She reports overall her energy levels are much better.  She is little walk further but does not have the leg down as much.  She notes that when she is up on her feet she does have some lower back soreness.  She did get to go to the [employer] show" did well there".  She does that she is not having any shortness of breath, lightheadedness, or dizziness.  She is also relying less on all laxatives in order to keep her bowels regular.  She denies fevers, chills, night sweats, shortness of breath, chest pain or cough.  She has no other complaints.  Rest of the 10 point ROS is below.  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[place])    Thyroid disease    Vitamin D deficiency     SURGICAL HISTORY: Past Surgical History:  Procedure Laterality Date   MOHS SURGERY  2016    SOCIAL HISTORY: Social History   Socioeconomic History   Marital status: Married    Spouse name: Not on file   Number of children: Not on file   Years of education: Not on file   Highest education level: Not on file  Occupational History   Not on file  Tobacco Use   Smoking status: Never   Smokeless tobacco: Never  Vaping Use   Vaping Use: Never used  Substance and Sexual Activity  Alcohol use: Yes    Alcohol/week: 5.0 standard drinks of alcohol    Types: 5 Standard drinks or equivalent per week    Comment: regular   Drug use: No   Sexual activity: Yes    Birth control/protection: Post-menopausal    Comment: 1st intercourse 18 yo-5 partners  Other Topics Concern   Not on file  Social History Narrative   She works at [employer] and [employer].    Married - 23 years    No kids       She likes to travel, read, cycle, be outside.    Social Determinants of Health    Financial Resource Strain: Not on file  Food Insecurity: Not on file  Transportation Needs: No Transportation Needs (10/18/2020)   PRAPARE - Hydrologist (Medical): No    Lack of Transportation (Non-Medical): No  Physical Activity: Not on file  Stress: Not on file  Social Connections: Not on file  Intimate Partner Violence: Not At Risk (12/04/2020)   Humiliation, Afraid, Rape, and Kick questionnaire    Fear of Current or Ex-Partner: No    Emotionally Abused: No    Physically Abused: No    Sexually Abused: No    FAMILY HISTORY: Family History  Problem Relation Age of Onset   Arthritis Mother    Hypertension Mother    Dementia Mother    Heart disease Father    Liver cancer Father    Asperger's syndrome Brother    Atrial fibrillation Brother     ALLERGIES:  is allergic to ramipril.  MEDICATIONS:  Current Outpatient Medications  Medication Sig Dispense Refill   acyclovir (ZOVIRAX) 400 MG tablet TAKE 1 TABLET BY MOUTH TWICE A DAY (Patient not taking: Reported on 07/31/2021) 180 tablet 3   allopurinol (ZYLOPRIM) 300 MG tablet TAKE 1 TABLET BY MOUTH EVERY DAY 90 tablet 1   amLODipine (NORVASC) 10 MG tablet Take 10 mg by mouth daily. (Patient not taking: Reported on 09/06/2021)     aspirin EC 81 MG tablet Take 81 mg by mouth daily. Swallow whole.     calcium carbonate (OS-CAL) 600 MG TABS Take 600 mg by mouth daily.     citalopram (CELEXA) 10 MG tablet TAKE 1 TABLET BY MOUTH EVERY DAY 90 tablet 0   gabapentin (NEURONTIN) 300 MG capsule Take 1 capsule (300 mg total) by mouth 3 (three) times daily. 90 capsule 1   lenalidomide (REVLIMID) 2.5 MG capsule Take 1 capsule (2.5 mg total) by mouth daily. [employer] Auth #    [id number]  Date Obtained 10/07/21  Take 1 capsule daily for 21 days and then none for 7 days. 21 capsule 0   lenalidomide (REVLIMID) 5 MG capsule TAKE 1 CAPSULE BY MOUTH 1 TIME A DAY FOR 21 DAYS ON THEN 7 DAYS OFF 21 capsule 0   levothyroxine  (SYNTHROID) 75 MCG tablet TAKE 1 TABLET BY MOUTH DAILY BEFORE BREAKFAST. 90 tablet 3   ondansetron (ZOFRAN) 8 MG tablet Take 1 tablet (8 mg total) by mouth every 8 (eight) hours as needed for nausea or vomiting. (Patient not taking: Reported on 10/07/2021) 60 tablet 2   oxyCODONE (ROXICODONE) 5 MG immediate release tablet Take 1 tablet (5 mg total) by mouth every 4 (four) hours as needed for severe pain. (Patient not taking: Reported on 09/06/2021) 15 tablet 0   prochlorperazine (COMPAZINE) 10 MG tablet Take 1 tablet (10 mg total) by mouth every 6 (six) hours as needed for nausea or vomiting.  60 tablet 2   traZODone (DESYREL) 50 MG tablet TAKE HALF TO 1 TABLET BY MOUTH AT BEDTIME AS NEEDED FOR SLEEP 90 tablet 0   VITAMIN D, CHOLECALCIFEROL, PO Take 2,000 Int'l Units by mouth daily.     No current facility-administered medications for this visit.    REVIEW OF SYSTEMS:   Constitutional: ( - ) fevers, ( - )  chills , ( - ) night sweats Eyes: ( - ) blurriness of vision, ( - ) double vision, ( - ) watery eyes Ears, nose, mouth, throat, and face: ( - ) mucositis, ( - ) sore throat Respiratory: ( - ) cough, ( - ) dyspnea, ( - ) wheezes Cardiovascular: ( - ) palpitation, ( - ) chest discomfort, ( - ) lower extremity swelling Gastrointestinal:  ( - ) nausea, ( - ) heartburn, ( + ) change in bowel habits Skin: ( - ) abnormal skin rashes Lymphatics: ( - ) new lymphadenopathy, ( - ) easy bruising Neurological: ( + ) numbness, ( - ) tingling, ( - ) new weaknesses Behavioral/Psych: ( - ) mood change, ( - ) new changes  All other systems were reviewed with the patient and are negative.  PHYSICAL EXAMINATION: ECOG PERFORMANCE STATUS: 1 - Symptomatic but completely ambulatory  Vitals:   10/07/21 1116  BP: 123/74  Pulse: 73  Resp: 14  Temp: (!) 97.5 F (36.4 C)  SpO2: 100%    Filed Weights   10/07/21 1116  Weight: 164 lb 3.2 oz (74.5 kg)     GENERAL: Well-appearing middle-age Caucasian female,  alert, no distress and comfortable SKIN: skin color, texture, turgor are normal, no rashes or significant lesions EYES: conjunctiva are pink and non-injected, sclera clear CHEST: mass over right clavicle, firm but decreasing in size  LUNGS: clear to auscultation and percussion with normal breathing effort HEART: regular rate & rhythm and no murmurs and no lower extremity edema Musculoskeletal: no cyanosis of digits and no clubbing  PSYCH: alert & oriented x 3, fluent speech NEURO: no focal motor/sensory deficits  LABORATORY DATA:  I have reviewed the data as listed    Latest Ref Rng & Units 10/07/2021   10:50 AM 09/23/2021    9:16 AM 09/06/2021    1:55 PM  CBC  WBC 4.0 - 10.5 K/uL 1.7  2.0  2.1   Hemoglobin 12.0 - 15.0 g/dL 10.2  9.9  9.3   Hematocrit 36.0 - 46.0 % 29.0  28.3  26.4   Platelets 150 - 400 K/uL 117  129  99        Latest Ref Rng & Units 10/07/2021   10:50 AM 09/23/2021    9:16 AM 09/06/2021    1:55 PM  CMP  Glucose 70 - 99 mg/dL 101  140  116   BUN 8 - 23 mg/dL _0 Creatinine 0.44 - 1.00 mg/dL 1.70  1.85  1.71   Sodium 135 - 145 mmol/L 139  140  141   Potassium 3.5 - 5.1 mmol/L 3.9  3.9  3.8   Chloride 98 - 111 mmol/L 107  107  109   CO2 22 - 32 mmol/L _1 Calcium 8.9 - 10.3 mg/dL 9.0  9.1  8.9   Total Protein 6.5 - 8.1 g/dL 5.8  6.0  6.0   Total Bilirubin 0.3 - 1.2 mg/dL 0.6  0.4  0.4   Alkaline Phos 38 - 126 U/L 61  61  55   AST 15 - 41 U/L _0 ALT 0 - 44 U/L _1 Lab Results  Component Value Date   MPROTEIN Not Observed 09/06/2021   MPROTEIN Not Observed 07/31/2021   MPROTEIN Not Observed 07/17/2021   Lab Results  Component Value Date   KPAFRELGTCHN 14.2 09/06/2021   KPAFRELGTCHN 12.6 07/31/2021   KPAFRELGTCHN 7.8 07/17/2021   LAMBDASER 10.9 09/06/2021   LAMBDASER 37.2 (H) 07/31/2021   LAMBDASER 32.3 (H) 07/17/2021   KAPLAMBRATIO 1.30 09/06/2021   KAPLAMBRATIO 0.34 07/31/2021   KAPLAMBRATIO 0.24 (L) 07/17/2021      RADIOGRAPHIC STUDIES: No results found.  [place] 66 y.o. 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  Previously we discussed the diagnosis and treatment options for plasmacytomas without multiple myeloma. This includes radiation therapy verus chemotherapy. Dr. Lorenso Courier discussed case with Dr. Maylene Roes from [facility] and the recommendation was to proceed with chemotherapy due to patients decline in renal function.  Given the patient's kidney dysfunction at this time the treatment of choice was CyBorD chemotherapy.  Patient received 7 cycles of CyBorD from 01/09/2021-06/26/2021.   Ms. Hegwood transitioned to maintenance Revlimid 5 mg PO daily starting 07/29/2021.   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   --Serological studies are confirmatory of a plasma cell neoplasm with markedly high lambda light chains and proteinuria.  --Bone marrow biopsy from 11/07/2020 showed no evidence of multiple myeloma. Right clavicle biopsy from 11/23/2020 confirmed plasma cell neoplasm consistent with plasmacytomas.  --PET scan from 11/27/2020 showed multifocal hypermetabolic soft tissue massing arising from the left and right ribs, left and right chest wall, right clavicle and right inferior pubic ischium. Largest is solid mass in the anterior left chest wall measuring 10.2 x 9.9 cm.  --Recommended to initiate systemic chemotherapy due to acute renal dysfunction.  --Received 7 cycles of CyBorD from 01/09/2021 to 06/26/2021 --Starting 07/29/2021, started maintenance therapy revlimid 5 mg PO daily 21 days on 7 days off.  Plan: --Most recent M protein was undetectable on 09/06/2021. Lambda light chain 10.9 with ratio 1.3.  --Labs today were reviewed and due to cytopenias we will drop the Revlimid dose to 2.5 mg instead of the 5.0 mg   --RTC in 4 weeks with labs  #Neutropenia/Anemia: --Secondary to underlying malignancy. --Hgb 10.2 and ANC 0.7 today -- Decreasing dose of Revlimid to try to improve this.  Additionally having patient hold the remaining 5 days of her Revlimid 5 mg in order to give her counts a chance to rebound.  #Renal dysfunction: --Secondary to underlying malignancy.  --Creatinine is 1.70, improving --Continue to monitor closely.   #Low back pain with neuropathic pain to bilateral legs: --Back pain has not improved since completion of palliative radiation. --Has oxycodone 5 mg PRN but hesitant to take due to existing constipation --Currently on gabapentin 300 mg TID with plans to increase 600 mg TID.  --Under the care of Dr. Mickeal Skinner in neurology.   #Hypotension: --Today's BP was 123/74 --Patient reports dizzy spells and near syncopal episodes. --Currently on Norvasc 10 mg daily, prescribed by nephrologist.  --Recommend to hold Norvasc, check BP daily at home,and f/u with nephrologist (Dr. Gean Quint) if she needs to resume BP medication  #Supportive Care -- port placement not required -- zofran 82m q8H PRN and compazine 125m  PO q6H for nausea -- acyclovir 436m PO BID for VCZ prophylaxis -- stool softeners/laxatives for constipation as needed  No orders of the defined types were placed in this encounter.  All questions were answered. The patient knows to call the clinic with any problems, questions or concerns.  I have spent a total of 30 minutes minutes of face-to-face and non-face-to-face time, preparing to see the patient, performing a medically appropriate examination, counseling and educating the patient, ordering medications, documenting clinical information in the electronic health record,  and care coordination.   JLedell Peoples MD Department of Hematology/Oncology CPinoleat [facility] CenterPhone: [phone]: [phone]:  [email or web address]

## 2021-10-07 NOTE — Telephone Encounter (Signed)
Per 10/2 los called and spoke to pt about appointment   pt confirmed appointment

## 2021-10-08 LAB — KAPPA/LAMBDA LIGHT CHAINS
Kappa free light chain: 19.5 mg/L — ABNORMAL HIGH (ref 3.3–19.4)
Kappa, lambda light chain ratio: 1.35 (ref 0.26–1.65)
Lambda free light chains: 14.4 mg/L (ref 5.7–26.3)

## 2021-10-14 LAB — HM MAMMOGRAPHY

## 2021-10-15 ENCOUNTER — Other Ambulatory Visit: Payer: Self-pay | Admitting: Internal Medicine

## 2021-10-21 ENCOUNTER — Inpatient Hospital Stay: Payer: 59

## 2021-10-21 ENCOUNTER — Other Ambulatory Visit: Payer: Self-pay

## 2021-10-21 DIAGNOSIS — C9000 Multiple myeloma not having achieved remission: Secondary | ICD-10-CM

## 2021-10-21 DIAGNOSIS — C9030 Solitary plasmacytoma not having achieved remission: Secondary | ICD-10-CM | POA: Diagnosis not present

## 2021-10-21 LAB — MULTIPLE MYELOMA PANEL, SERUM
Albumin SerPl Elph-Mcnc: 3.8 g/dL (ref 2.9–4.4)
Albumin/Glob SerPl: 2 — ABNORMAL HIGH (ref 0.7–1.7)
Alpha 1: 0.2 g/dL (ref 0.0–0.4)
Alpha2 Glob SerPl Elph-Mcnc: 0.6 g/dL (ref 0.4–1.0)
B-Globulin SerPl Elph-Mcnc: 0.8 g/dL (ref 0.7–1.3)
Gamma Glob SerPl Elph-Mcnc: 0.4 g/dL (ref 0.4–1.8)
Globulin, Total: 2 g/dL — ABNORMAL LOW (ref 2.2–3.9)
IgA: 18 mg/dL — ABNORMAL LOW (ref 87–352)
IgG (Immunoglobin G), Serum: 417 mg/dL — ABNORMAL LOW (ref 586–1602)
Total Protein ELP: 5.8 g/dL — ABNORMAL LOW (ref 6.0–8.5)

## 2021-10-21 LAB — CMP (CANCER CENTER ONLY)
ALT: 13 U/L (ref 0–44)
AST: 15 U/L (ref 15–41)
Albumin: 4.3 g/dL (ref 3.5–5.0)
Alkaline Phosphatase: 69 U/L (ref 38–126)
Anion gap: 5 (ref 5–15)
BUN: 29 mg/dL — ABNORMAL HIGH (ref 8–23)
CO2: 26 mmol/L (ref 22–32)
Calcium: 9.1 mg/dL (ref 8.9–10.3)
Chloride: 108 mmol/L (ref 98–111)
Creatinine: 1.7 mg/dL — ABNORMAL HIGH (ref 0.44–1.00)
GFR, Estimated: 33 mL/min — ABNORMAL LOW (ref >60)
Glucose, Bld: 76 mg/dL (ref 70–99)
Potassium: 4.2 mmol/L (ref 3.5–5.1)
Sodium: 139 mmol/L (ref 135–145)
Total Bilirubin: 0.4 mg/dL (ref 0.3–1.2)
Total Protein: 6.5 g/dL (ref 6.5–8.1)

## 2021-10-21 LAB — CBC WITH DIFFERENTIAL (CANCER CENTER ONLY)
Abs Immature Granulocytes: 0 10*3/uL (ref 0.00–0.07)
Basophils Absolute: 0.1 10*3/uL (ref 0.0–0.1)
Basophils Relative: 5 %
Eosinophils Absolute: 0.1 10*3/uL (ref 0.0–0.5)
Eosinophils Relative: 5 %
HCT: 31 % — ABNORMAL LOW (ref 36.0–46.0)
Hemoglobin: 10.8 g/dL — ABNORMAL LOW (ref 12.0–15.0)
Immature Granulocytes: 0 %
Lymphocytes Relative: 28 %
Lymphs Abs: 0.5 10*3/uL — ABNORMAL LOW (ref 0.7–4.0)
MCH: 31.8 pg (ref 26.0–34.0)
MCHC: 34.8 g/dL (ref 30.0–36.0)
MCV: 91.2 fL (ref 80.0–100.0)
Monocytes Absolute: 0.2 10*3/uL (ref 0.1–1.0)
Monocytes Relative: 11 %
Neutro Abs: 0.9 10*3/uL — ABNORMAL LOW (ref 1.7–7.7)
Neutrophils Relative %: 51 %
Platelet Count: 150 10*3/uL (ref 150–400)
RBC: 3.4 MIL/uL — ABNORMAL LOW (ref 3.87–5.11)
RDW: 13.7 % (ref 11.5–15.5)
WBC Count: 1.8 10*3/uL — ABNORMAL LOW (ref 4.0–10.5)
nRBC: 0 % (ref 0.0–0.2)

## 2021-10-21 LAB — LACTATE DEHYDROGENASE: LDH: 131 U/L (ref 98–192)

## 2021-10-30 ENCOUNTER — Encounter: Payer: Self-pay | Admitting: Adult Health

## 2021-11-01 ENCOUNTER — Other Ambulatory Visit: Payer: Self-pay | Admitting: *Deleted

## 2021-11-01 ENCOUNTER — Other Ambulatory Visit: Payer: Self-pay | Admitting: Hematology and Oncology

## 2021-11-01 MED ORDER — LENALIDOMIDE 2.5 MG PO CAPS: ORAL_CAPSULE | ORAL | 0 refills | Status: DC

## 2021-11-04 ENCOUNTER — Inpatient Hospital Stay: Payer: 59

## 2021-11-04 ENCOUNTER — Other Ambulatory Visit: Payer: Self-pay

## 2021-11-04 ENCOUNTER — Inpatient Hospital Stay (HOSPITAL_BASED_OUTPATIENT_CLINIC_OR_DEPARTMENT_OTHER): Payer: 59 | Admitting: Hematology and Oncology

## 2021-11-04 VITALS — BP 119/74 | HR 87 | Temp 100.0°F | Resp 16 | Wt 163.1 lb

## 2021-11-04 DIAGNOSIS — R7989 Other specified abnormal findings of blood chemistry: Secondary | ICD-10-CM

## 2021-11-04 DIAGNOSIS — D649 Anemia, unspecified: Secondary | ICD-10-CM

## 2021-11-04 DIAGNOSIS — C9030 Solitary plasmacytoma not having achieved remission: Secondary | ICD-10-CM | POA: Diagnosis not present

## 2021-11-04 DIAGNOSIS — D696 Thrombocytopenia, unspecified: Secondary | ICD-10-CM | POA: Diagnosis not present

## 2021-11-04 DIAGNOSIS — C9001 Multiple myeloma in remission: Secondary | ICD-10-CM | POA: Diagnosis not present

## 2021-11-04 DIAGNOSIS — C9000 Multiple myeloma not having achieved remission: Secondary | ICD-10-CM

## 2021-11-04 LAB — CBC WITH DIFFERENTIAL (CANCER CENTER ONLY)
Abs Immature Granulocytes: 0.01 10*3/uL (ref 0.00–0.07)
Basophils Absolute: 0.1 10*3/uL (ref 0.0–0.1)
Basophils Relative: 3 %
Eosinophils Absolute: 0.2 10*3/uL (ref 0.0–0.5)
Eosinophils Relative: 11 %
HCT: 33 % — ABNORMAL LOW (ref 36.0–46.0)
Hemoglobin: 11.6 g/dL — ABNORMAL LOW (ref 12.0–15.0)
Immature Granulocytes: 1 %
Lymphocytes Relative: 16 %
Lymphs Abs: 0.3 10*3/uL — ABNORMAL LOW (ref 0.7–4.0)
MCH: 31.7 pg (ref 26.0–34.0)
MCHC: 35.2 g/dL (ref 30.0–36.0)
MCV: 90.2 fL (ref 80.0–100.0)
Monocytes Absolute: 0.3 10*3/uL (ref 0.1–1.0)
Monocytes Relative: 13 %
Neutro Abs: 1.1 10*3/uL — ABNORMAL LOW (ref 1.7–7.7)
Neutrophils Relative %: 56 %
Platelet Count: 110 10*3/uL — ABNORMAL LOW (ref 150–400)
RBC: 3.66 MIL/uL — ABNORMAL LOW (ref 3.87–5.11)
RDW: 13.2 % (ref 11.5–15.5)
WBC Count: 1.9 10*3/uL — ABNORMAL LOW (ref 4.0–10.5)
nRBC: 0 % (ref 0.0–0.2)

## 2021-11-04 LAB — CMP (CANCER CENTER ONLY)
ALT: 12 U/L (ref 0–44)
AST: 14 U/L — ABNORMAL LOW (ref 15–41)
Albumin: 4.5 g/dL (ref 3.5–5.0)
Alkaline Phosphatase: 74 U/L (ref 38–126)
Anion gap: 9 (ref 5–15)
BUN: 29 mg/dL — ABNORMAL HIGH (ref 8–23)
CO2: 24 mmol/L (ref 22–32)
Calcium: 9.2 mg/dL (ref 8.9–10.3)
Chloride: 105 mmol/L (ref 98–111)
Creatinine: 2.08 mg/dL — ABNORMAL HIGH (ref 0.44–1.00)
GFR, Estimated: 26 mL/min — ABNORMAL LOW (ref >60)
Glucose, Bld: 94 mg/dL (ref 70–99)
Potassium: 3.9 mmol/L (ref 3.5–5.1)
Sodium: 138 mmol/L (ref 135–145)
Total Bilirubin: 0.5 mg/dL (ref 0.3–1.2)
Total Protein: 7.1 g/dL (ref 6.5–8.1)

## 2021-11-04 LAB — LACTATE DEHYDROGENASE: LDH: 125 U/L (ref 98–192)

## 2021-11-04 NOTE — Progress Notes (Signed)
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  Serological testing concerning for a plasma cell neoplasm, with UPEP showing 7.4 g of protein daily with markedly high lambda light chains -11/07/2020: Bone marrow biopsy performed, shows no evidence of multiple myeloma -11/23/2020: Biopsy of right clavicle mass confirmed plasmacytoma.  -11/27/2020: PET scan show intensely hypermetabolic large soft tissue masses arising from the LEFT and RIGHT ribs. Two large lesions in the LEFT chest wall and a single large lesion in the RIGHT chest wall.Hypermetabolic expansile solid masses arising from the medial RIGHT clavicle and RIGHT inferior pubic ischium 01/09/2021: Cycle 1, Day 1 CyBorD 02/06/2021: Cycle 2, Day 1 CyBorD 03/06/2021: Cycle 3, Day 1 CyBorD 04/03/2021: Cycle 4, Day 1 CyBorD  05/01/2021: Cycle 5, Day 1 CyBorD  05/29/2021: Cycle 6, Day 1 CyBorD  06/26/2021: Cycle 7, Day 1 CyBorD  07/29/2021: Started maintenance Revlimid 5 mg once daily, 21 days on, 7 days off every 28 days.   Interval History:  Jordan Gregory 66 y.o. 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 The patient was last seen on 09/06/2021.  In the interim, she has continued maintenance Revlimid therapy.   On exam today Jordan Gregory reports she does unfortunately have a cough and cold with congestion at this time.  She notes that she picked up this  infection from her husband.  Overall she "does not feel bad".  She notes that she did have a slight temperature initially which has subsequently resolved.  She reports that she has been doing her best to try to avoid crowded places and close contact.  She notes her energy today is much better and overall she feels a gradual improvement.  She is able to do much more in the house and is having virtually no dizziness.  She notes that she does feel cold particularly chilly days.  She reports that she is taking her Revlimid pills faithfully and is sticking to the 21 days on and 7 days off schedule.  She is not having any noted.  Negative side effects that she can notice.  Overall she feels much more "able".  She denies fevers, chills, night sweats, shortness of breath, chest pain or cough.  She has no other complaints.  Rest of the 10 point ROS is below.  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[place])    Thyroid disease    Vitamin D deficiency     SURGICAL HISTORY: Past Surgical History:  Procedure Laterality Date   MOHS SURGERY  2016    SOCIAL HISTORY: Social History   Socioeconomic History   Marital status: Married    Spouse name: Not on file   Number of children: Not on file   Years of education: Not on file   Highest education level: Not on file  Occupational History   Not on file  Tobacco Use   Smoking status: Never   Smokeless tobacco: Never  Vaping Use  Vaping Use: Never used  Substance and Sexual Activity   Alcohol use: Yes    Alcohol/week: 5.0 standard drinks of alcohol    Types: 5 Standard drinks or equivalent per week    Comment: regular   Drug use: No   Sexual activity: Yes    Birth control/protection: Post-menopausal    Comment: 1st intercourse 44 yo-5 partners  Other Topics Concern   Not on file  Social History Narrative   She works at [employer] and [employer].    Married - 23 years    No kids       She  likes to travel, read, cycle, be outside.    Social Determinants of Health   Financial Resource Strain: Not on file  Food Insecurity: Not on file  Transportation Needs: No Transportation Needs (10/18/2020)   PRAPARE - Hydrologist (Medical): No    Lack of Transportation (Non-Medical): No  Physical Activity: Not on file  Stress: Not on file  Social Connections: Not on file  Intimate Partner Violence: Not At Risk (12/04/2020)   Humiliation, Afraid, Rape, and Kick questionnaire    Fear of Current or Ex-Partner: No    Emotionally Abused: No    Physically Abused: No    Sexually Abused: No    FAMILY HISTORY: Family History  Problem Relation Age of Onset   Arthritis Mother    Hypertension Mother    Dementia Mother    Heart disease Father    Liver cancer Father    Asperger's syndrome Brother    Atrial fibrillation Brother     ALLERGIES:  is allergic to ramipril.  MEDICATIONS:  Current Outpatient Medications  Medication Sig Dispense Refill   amLODipine (NORVASC) 10 MG tablet Take 10 mg by mouth daily. (Patient not taking: Reported on 09/06/2021)     aspirin EC 81 MG tablet Take 81 mg by mouth daily. Swallow whole.     calcium carbonate (OS-CAL) 600 MG TABS Take 600 mg by mouth daily.     citalopram (CELEXA) 10 MG tablet TAKE 1 TABLET BY MOUTH EVERY DAY 90 tablet 0   gabapentin (NEURONTIN) 300 MG capsule TAKE 1 CAPSULE BY MOUTH THREE TIMES A DAY 90 capsule 1   lenalidomide (REVLIMID) 2.5 MG capsule TAKE 1 CAPSULE BY MOUTH 1 TIME A DAY FOR 21 DAYS ON THEN 7 DAYS OFF [employer] Auth # [id number] Date obtained: 11/01/21 21 capsule 0   levothyroxine (SYNTHROID) 75 MCG tablet TAKE 1 TABLET BY MOUTH DAILY BEFORE BREAKFAST. 90 tablet 3   ondansetron (ZOFRAN) 8 MG tablet Take 1 tablet (8 mg total) by mouth every 8 (eight) hours as needed for nausea or vomiting. (Patient not taking: Reported on 10/07/2021) 60 tablet 2   prochlorperazine (COMPAZINE) 10 MG tablet Take 1  tablet (10 mg total) by mouth every 6 (six) hours as needed for nausea or vomiting. 60 tablet 2   traZODone (DESYREL) 50 MG tablet TAKE HALF TO 1 TABLET BY MOUTH AT BEDTIME AS NEEDED FOR SLEEP 90 tablet 0   VITAMIN D, CHOLECALCIFEROL, PO Take 2,000 Int'l Units by mouth daily.     No current facility-administered medications for this visit.    REVIEW OF SYSTEMS:   Constitutional: ( - ) fevers, ( - )  chills , ( - ) night sweats Eyes: ( - ) blurriness of vision, ( - ) double vision, ( - ) watery eyes Ears, nose, mouth, throat, and face: ( - ) mucositis, ( - ) sore  throat Respiratory: ( - ) cough, ( - ) dyspnea, ( - ) wheezes Cardiovascular: ( - ) palpitation, ( - ) chest discomfort, ( - ) lower extremity swelling Gastrointestinal:  ( - ) nausea, ( - ) heartburn, ( + ) change in bowel habits Skin: ( - ) abnormal skin rashes Lymphatics: ( - ) new lymphadenopathy, ( - ) easy bruising Neurological: ( + ) numbness, ( - ) tingling, ( - ) new weaknesses Behavioral/Psych: ( - ) mood change, ( - ) new changes  All other systems were reviewed with the patient and are negative.  PHYSICAL EXAMINATION: ECOG PERFORMANCE STATUS: 1 - Symptomatic but completely ambulatory  Vitals:   11/04/21 1115  BP: 119/74  Pulse: 87  Resp: 16  Temp: 100 F (37.8 C)  SpO2: 95%    Filed Weights   11/04/21 1115  Weight: 163 lb 1.6 oz (74 kg)     GENERAL: Well-appearing middle-age Caucasian female, alert, no distress and comfortable SKIN: skin color, texture, turgor are normal, no rashes or significant lesions EYES: conjunctiva are pink and non-injected, sclera clear CHEST: mass over right clavicle, firm but decreasing in size  LUNGS: clear to auscultation and percussion with normal breathing effort HEART: regular rate & rhythm and no murmurs and no lower extremity edema Musculoskeletal: no cyanosis of digits and no clubbing  PSYCH: alert & oriented x 3, fluent speech NEURO: no focal motor/sensory  deficits  LABORATORY DATA:  I have reviewed the data as listed    Latest Ref Rng & Units 11/04/2021   10:49 AM 10/21/2021   10:33 AM 10/07/2021   10:50 AM  CBC  WBC 4.0 - 10.5 K/uL 1.9  1.8  1.7   Hemoglobin 12.0 - 15.0 g/dL 11.6  10.8  10.2   Hematocrit 36.0 - 46.0 % 33.0  31.0  29.0   Platelets 150 - 400 K/uL 110  150  117        Latest Ref Rng & Units 11/04/2021   10:49 AM 10/21/2021   10:33 AM 10/07/2021   10:50 AM  CMP  Glucose 70 - 99 mg/dL 94  76  101   BUN 8 - 23 mg/dL _0 Creatinine 0.44 - 1.00 mg/dL 2.08  1.70  1.70   Sodium 135 - 145 mmol/L 138  139  139   Potassium 3.5 - 5.1 mmol/L 3.9  4.2  3.9   Chloride 98 - 111 mmol/L 105  108  107   CO2 22 - 32 mmol/L _1 Calcium 8.9 - 10.3 mg/dL 9.2  9.1  9.0   Total Protein 6.5 - 8.1 g/dL 7.1  6.5  5.8   Total Bilirubin 0.3 - 1.2 mg/dL 0.5  0.4  0.6   Alkaline Phos 38 - 126 U/L 74  69  61   AST 15 - 41 U/L _2 ALT 0 - 44 U/L _3 Lab Results  Component Value Date   MPROTEIN Not Observed 10/07/2021   MPROTEIN Not Observed 09/06/2021   MPROTEIN Not Observed 07/31/2021   Lab Results  Component Value Date   KPAFRELGTCHN 19.5 (H) 10/07/2021   KPAFRELGTCHN 14.2 09/06/2021   KPAFRELGTCHN 12.6 07/31/2021   LAMBDASER 14.4 10/07/2021   LAMBDASER 10.9 09/06/2021   LAMBDASER 37.2 (H) 07/31/2021   KAPLAMBRATIO 1.35 10/07/2021   KAPLAMBRATIO 1.30 09/06/2021   KAPLAMBRATIO 0.34 07/31/2021  RADIOGRAPHIC STUDIES: No results found.  Jordan Gregory 66 y.o. 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  Previously we discussed the diagnosis and treatment options for plasmacytomas without multiple myeloma. This includes radiation therapy verus chemotherapy. Dr. Lorenso Courier discussed case with Dr. Maylene Roes from [facility] and the recommendation was to proceed with chemotherapy due to patients decline in  renal function.  Given the patient's kidney dysfunction at this time the treatment of choice was CyBorD chemotherapy.  Patient received 7 cycles of CyBorD from 01/09/2021-06/26/2021.   Jordan Gregory transitioned to maintenance Revlimid 5 mg PO daily starting 07/29/2021.   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   --Serological studies are confirmatory of a plasma cell neoplasm with markedly high lambda light chains and proteinuria.  --Bone marrow biopsy from 11/07/2020 showed no evidence of multiple myeloma. Right clavicle biopsy from 11/23/2020 confirmed plasma cell neoplasm consistent with plasmacytomas.  --PET scan from 11/27/2020 showed multifocal hypermetabolic soft tissue massing arising from the left and right ribs, left and right chest wall, right clavicle and right inferior pubic ischium. Largest is solid mass in the anterior left chest wall measuring 10.2 x 9.9 cm.  --Recommended to initiate systemic chemotherapy due to acute renal dysfunction.  --Received 7 cycles of CyBorD from 01/09/2021 to 06/26/2021 --Starting 07/29/2021, started maintenance therapy revlimid 5 mg PO daily 21 days on 7 days off.  Plan: --Most recent M protein was undetectable on 10/07/2021. Lambda light chain 14.4 with ratio 1.35.  --Labs today were reviewed and due to cytopenias we dropped the Revlimid dose to 2.5 mg instead of the 5.0 mg  --RTC in 4 weeks with labs  #Neutropenia/Anemia: --Secondary to revlimid pills/poor kidney function  --Hgb 11.6 and ANC 1.1 today -- Decreased dose of Revlimid to try to improve this.  #Renal dysfunction: --Secondary to damage from malignancy.  --Creatinine is 2.08, improving --Continue to monitor closely.   #Low back pain with neuropathic pain to bilateral legs: --Back pain has not improved since completion of palliative radiation. --Has oxycodone 5 mg PRN but hesitant to take due to existing  constipation --Currently on gabapentin 300 mg TID with plans to increase 600 mg TID.  --Under the care of Dr. Mickeal Skinner in neurology.   #Hypotension-resolved: --Today's BP was 119/74 --Patient reports dizzy spells and near syncopal episodes. --Currently on Norvasc 10 mg daily, prescribed by nephrologist.  --Recommend to hold Norvasc, check BP daily at home,and f/u with nephrologist (Dr. Gean Quint) if she needs to resume BP medication  #Supportive Care -- port placement not required -- zofran 84m q8H PRN and compazine 152mPO q6H for nausea -- acyclovir 40094mO BID for VCZ prophylaxis -- stool softeners/laxatives for constipation as needed  No orders of the defined types were placed in this encounter.  All questions were answered. The patient knows to call the clinic with any problems, questions or concerns.  I have spent a total of 30 minutes minutes of face-to-face and non-face-to-face time, preparing to see the patient, performing a medically appropriate examination, counseling and educating the patient, ordering medications, documenting clinical information in the electronic health record,  and care coordination.   JohLedell PeoplesD Department of Hematology/Oncology [facility] Hospital[facility]: [phone]: [phone]: [email or web address]

## 2021-11-05 LAB — KAPPA/LAMBDA LIGHT CHAINS
Kappa free light chain: 20.3 mg/L — ABNORMAL HIGH (ref 3.3–19.4)
Kappa, lambda light chain ratio: 1.74 — ABNORMAL HIGH (ref 0.26–1.65)
Lambda free light chains: 11.7 mg/L (ref 5.7–26.3)

## 2021-11-08 LAB — MULTIPLE MYELOMA PANEL, SERUM
Albumin SerPl Elph-Mcnc: 3.8 g/dL (ref 2.9–4.4)
Albumin/Glob SerPl: 1.8 — ABNORMAL HIGH (ref 0.7–1.7)
Alpha 1: 0.2 g/dL (ref 0.0–0.4)
Alpha2 Glob SerPl Elph-Mcnc: 0.7 g/dL (ref 0.4–1.0)
B-Globulin SerPl Elph-Mcnc: 0.8 g/dL (ref 0.7–1.3)
Gamma Glob SerPl Elph-Mcnc: 0.4 g/dL (ref 0.4–1.8)
Globulin, Total: 2.2 g/dL (ref 2.2–3.9)
IgA: 20 mg/dL — ABNORMAL LOW (ref 87–352)
IgG (Immunoglobin G), Serum: 494 mg/dL — ABNORMAL LOW (ref 586–1602)
IgM (Immunoglobulin M), Srm: 15 mg/dL — ABNORMAL LOW (ref 26–217)
Total Protein ELP: 6 g/dL (ref 6.0–8.5)

## 2021-12-02 ENCOUNTER — Other Ambulatory Visit: Payer: Self-pay | Admitting: Hematology and Oncology

## 2021-12-07 ENCOUNTER — Other Ambulatory Visit: Payer: Self-pay | Admitting: Adult Health

## 2021-12-07 DIAGNOSIS — F32A Depression, unspecified: Secondary | ICD-10-CM

## 2021-12-19 ENCOUNTER — Ambulatory Visit (INDEPENDENT_AMBULATORY_CARE_PROVIDER_SITE_OTHER): Payer: 59 | Admitting: Adult Health

## 2021-12-19 ENCOUNTER — Encounter: Payer: Self-pay | Admitting: Adult Health

## 2021-12-19 VITALS — BP 120/80 | Temp 97.0°F | Ht 67.0 in | Wt 167.0 lb

## 2021-12-19 DIAGNOSIS — G4700 Insomnia, unspecified: Secondary | ICD-10-CM | POA: Diagnosis not present

## 2021-12-19 DIAGNOSIS — C9030 Solitary plasmacytoma not having achieved remission: Secondary | ICD-10-CM | POA: Diagnosis not present

## 2021-12-19 DIAGNOSIS — E039 Hypothyroidism, unspecified: Secondary | ICD-10-CM

## 2021-12-19 DIAGNOSIS — Z0000 Encounter for general adult medical examination without abnormal findings: Secondary | ICD-10-CM

## 2021-12-19 DIAGNOSIS — Z1211 Encounter for screening for malignant neoplasm of colon: Secondary | ICD-10-CM

## 2021-12-19 LAB — CBC WITH DIFFERENTIAL/PLATELET
Basophils Absolute: 0.1 10*3/uL (ref 0.0–0.1)
Basophils Relative: 5.4 % — ABNORMAL HIGH (ref 0.0–3.0)
Eosinophils Absolute: 0.2 10*3/uL (ref 0.0–0.7)
Eosinophils Relative: 8.8 % — ABNORMAL HIGH (ref 0.0–5.0)
HCT: 33.9 % — ABNORMAL LOW (ref 36.0–46.0)
Hemoglobin: 11.7 g/dL — ABNORMAL LOW (ref 12.0–15.0)
Lymphocytes Relative: 25.7 % (ref 12.0–46.0)
Lymphs Abs: 0.5 10*3/uL — ABNORMAL LOW (ref 0.7–4.0)
MCHC: 34.5 g/dL (ref 30.0–36.0)
MCV: 89.5 fl (ref 78.0–100.0)
Monocytes Absolute: 0.2 10*3/uL (ref 0.1–1.0)
Monocytes Relative: 12.2 % — ABNORMAL HIGH (ref 3.0–12.0)
Neutro Abs: 1 10*3/uL — ABNORMAL LOW (ref 1.4–7.7)
Neutrophils Relative %: 47.9 % (ref 43.0–77.0)
Platelets: 157 10*3/uL (ref 150.0–400.0)
RBC: 3.79 Mil/uL — ABNORMAL LOW (ref 3.87–5.11)
RDW: 14.7 % (ref 11.5–15.5)
WBC: 2 10*3/uL — ABNORMAL LOW (ref 4.0–10.5)

## 2021-12-19 LAB — LIPID PANEL
Cholesterol: 184 mg/dL (ref 0–200)
HDL: 69.5 mg/dL (ref >39.00)
LDL Cholesterol: 102 mg/dL — ABNORMAL HIGH (ref 0–99)
NonHDL: 114.68
Total CHOL/HDL Ratio: 3
Triglycerides: 65 mg/dL (ref 0.0–149.0)
VLDL: 13 mg/dL (ref 0.0–40.0)

## 2021-12-19 LAB — COMPREHENSIVE METABOLIC PANEL
ALT: 10 U/L (ref 0–35)
AST: 13 U/L (ref 0–37)
Albumin: 4.3 g/dL (ref 3.5–5.2)
Alkaline Phosphatase: 62 U/L (ref 39–117)
BUN: 25 mg/dL — ABNORMAL HIGH (ref 6–23)
CO2: 26 mEq/L (ref 19–32)
Calcium: 9.2 mg/dL (ref 8.4–10.5)
Chloride: 107 mEq/L (ref 96–112)
Creatinine, Ser: 1.76 mg/dL — ABNORMAL HIGH (ref 0.40–1.20)
GFR: 29.58 mL/min — ABNORMAL LOW (ref >60.00)
Glucose, Bld: 89 mg/dL (ref 70–99)
Potassium: 4.4 mEq/L (ref 3.5–5.1)
Sodium: 140 mEq/L (ref 135–145)
Total Bilirubin: 0.5 mg/dL (ref 0.2–1.2)
Total Protein: 6.5 g/dL (ref 6.0–8.3)

## 2021-12-19 LAB — TSH: TSH: 1.45 u[IU]/mL (ref 0.35–5.50)

## 2021-12-19 MED ORDER — TRAZODONE HCL 50 MG PO TABS: ORAL_TABLET | ORAL | 1 refills | Status: DC

## 2021-12-19 NOTE — Progress Notes (Signed)
Subjective:    Patient ID: Jordan Gregory, female    DOB: 1955-11-03, 66 y.o.   MRN: [id number]  HPI Patient presents for yearly preventative medicine examination. She is a pleasant 66 year old female who  has a past medical history of Hypertension, Insomnia, Plasmacytoma ([place]), Thyroid disease, and Vitamin D deficiency.  Plasmacytoma without multiple myeloma-October 2022 MRI showed a pathologic fracture at the medial clavicle associated with 5.1 x 2.7 x 4.1 cm mass.  She was then referred to the [facility] and serological testing concerning for plasma cell neoplasm with UPEP showing 7.4 g of protein daily with markedly high lambda light chains.  Bone marrow biopsy was performed which showed no evidence of multiple myeloma.  Biopsy of the right clavicle mass confirmed plasmacytoma.  She had a PET scan done in November 2022 that showed intensely hypermetabolic large soft tissue masses arising from the left and right ribs.  2 large lesions on the left chest wall and a single large lesion on the right chest wall.  Hypermetabolic expansile solid mass arising from the medial right clavicle and right inferior public ischium.  She then underwent 7 cycles of CyBorD and in July 2023 started maintenance with Revlimid 2.5 mg daily, 21 days off, 7 days off every 28 days.  Overall she is feeling well, feels as though her energy is good and she is able to complete her activities of daily living. She is now considered in remission.   Hypothyroidism -managed with Synthroid 75 mcg daily.  Denies symptoms of hyper or hypothyroidism  Insomnia-uses trazodone as needed. Has some trouble falling asleep but when she does fall asleep she sleeps well.   Anxiety and Depression - well controlled on Celexa 10 mg. She wants to continue with this medication.   All immunizations and health maintenance protocols were reviewed with the patient and needed orders were placed.  Appropriate screening laboratory  values were ordered for the patient including screening of hyperlipidemia, renal function and hepatic function.   Medication reconciliation,  past medical history, social history, problem list and allergies were reviewed in detail with the patient  Goals were established with regard to weight loss, exercise, and  diet in compliance with medications  She is due for her colonoscopy but was advised to wait on doing it by her oncologist.   She has no acute complaints  She has been seen by Dermatology for her routine visit. She is up to date on her mammogram.   Review of Systems  Constitutional: Negative.   HENT: Negative.    Eyes: Negative.   Respiratory: Negative.    Cardiovascular: Negative.   Gastrointestinal:  Positive for constipation.  Endocrine: Negative.   Genitourinary: Negative.   Musculoskeletal:  Positive for arthralgias.  Skin: Negative.   Allergic/Immunologic: Negative.   Neurological: Negative.   Hematological: Negative.   Psychiatric/Behavioral: Negative.     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[place])    Thyroid disease    Vitamin D deficiency     Social History   Socioeconomic History   Marital status: Married    Spouse name: Not on file   Number of children: Not on file   Years of education: Not on file   Highest education level: Not on file  Occupational History   Not on file  Tobacco Use   Smoking status: Never   Smokeless tobacco: Never  Vaping Use  Vaping Use: Never used  Substance and Sexual Activity   Alcohol use: Yes    Alcohol/week: 5.0 standard drinks of alcohol    Types: 5 Standard drinks or equivalent per week    Comment: regular   Drug use: No   Sexual activity: Yes    Birth control/protection: Post-menopausal    Comment: 1st intercourse 70 yo-5 partners  Other Topics Concern   Not on file  Social History Narrative   She works at [employer] and [employer].    Married - 23 years    No kids        She likes to travel, read, cycle, be outside.    Social Determinants of Health   Financial Resource Strain: Not on file  Food Insecurity: Not on file  Transportation Needs: No Transportation Needs (10/18/2020)   PRAPARE - Hydrologist (Medical): No    Lack of Transportation (Non-Medical): No  Physical Activity: Not on file  Stress: Not on file  Social Connections: Not on file  Intimate Partner Violence: Not At Risk (12/04/2020)   Humiliation, Afraid, Rape, and Kick questionnaire    Fear of Current or Ex-Partner: No    Emotionally Abused: No    Physically Abused: No    Sexually Abused: No    Past Surgical History:  Procedure Laterality Date   MOHS SURGERY  2016    Family History  Problem Relation Age of Onset   Arthritis Mother    Hypertension Mother    Dementia Mother    Heart disease Father    Liver cancer Father    Asperger's syndrome Brother    Atrial fibrillation Brother     Allergies  Allergen Reactions   Ramipril Cough    Current Outpatient Medications on File Prior to Visit  Medication Sig Dispense Refill   aspirin EC 81 MG tablet Take 81 mg by mouth daily. Swallow whole.     calcium carbonate (OS-CAL) 600 MG TABS Take 600 mg by mouth daily.     citalopram (CELEXA) 10 MG tablet TAKE 1 TABLET BY MOUTH EVERY DAY 90 tablet 0   gabapentin (NEURONTIN) 300 MG capsule TAKE 1 CAPSULE BY MOUTH THREE TIMES A DAY 90 capsule 1   lenalidomide (REVLIMID) 2.5 MG capsule TAKE 1 CAPSULE BY MOUTH 1 TIME A DAY FOR 21 DAYS ON THEN 7 DAYS OFF [employer] Auth # [id number] Date obtained 12/02/21 21 capsule 0   levothyroxine (SYNTHROID) 75 MCG tablet TAKE 1 TABLET BY MOUTH DAILY BEFORE BREAKFAST. 90 tablet 3   VITAMIN D, CHOLECALCIFEROL, PO Take 2,000 Int'l Units by mouth daily.     amLODipine (NORVASC) 10 MG tablet Take 10 mg by mouth daily. (Patient not taking: Reported on 12/19/2021)     ondansetron (ZOFRAN) 8 MG tablet Take 1 tablet (8 mg total) by  mouth every 8 (eight) hours as needed for nausea or vomiting. (Patient not taking: Reported on 12/19/2021) 60 tablet 2   prochlorperazine (COMPAZINE) 10 MG tablet Take 1 tablet (10 mg total) by mouth every 6 (six) hours as needed for nausea or vomiting. (Patient not taking: Reported on 12/19/2021) 60 tablet 2   No current facility-administered medications on file prior to visit.    BP 120/80   Temp (!) 97 F (36.1 C) (Tympanic)   Ht 5' 7" (1.702 m)   Wt 167 lb (75.8 kg)   LMP 03/04/2011   BMI 26.16 kg/m       Objective:   Physical Exam Vitals  and nursing note reviewed.  Constitutional:      General: She is not in acute distress.    Appearance: Normal appearance. She is well-developed. She is not ill-appearing.  HENT:     Head: Normocephalic and atraumatic.     Right Ear: Tympanic membrane, ear canal and external ear normal. There is no impacted cerumen.     Left Ear: Tympanic membrane, ear canal and external ear normal. There is no impacted cerumen.     Nose: Nose normal. No congestion or rhinorrhea.     Mouth/Throat:     Mouth: Mucous membranes are moist.     Pharynx: Oropharynx is clear. No oropharyngeal exudate or posterior oropharyngeal erythema.  Eyes:     General:        Right eye: No discharge.        Left eye: No discharge.     Extraocular Movements: Extraocular movements intact.     Conjunctiva/sclera: Conjunctivae normal.     Pupils: Pupils are equal, round, and reactive to light.  Neck:     Thyroid: No thyromegaly.     Vascular: No carotid bruit.     Trachea: No tracheal deviation.  Cardiovascular:     Rate and Rhythm: Normal rate and regular rhythm.     Pulses: Normal pulses.     Heart sounds: Normal heart sounds. No murmur heard.    No friction rub. No gallop.  Pulmonary:     Effort: Pulmonary effort is normal. No respiratory distress.     Breath sounds: Normal breath sounds. No stridor. No wheezing, rhonchi or rales.  Chest:     Chest wall: No  tenderness.  Abdominal:     General: Abdomen is flat. Bowel sounds are normal. There is no distension.     Palpations: Abdomen is soft. There is no mass.     Tenderness: There is no abdominal tenderness. There is no right CVA tenderness, left CVA tenderness, guarding or rebound.     Hernia: No hernia is present.  Musculoskeletal:        General: No swelling, tenderness, deformity or signs of injury. Normal range of motion.     Cervical back: Normal range of motion and neck supple.     Right lower leg: No edema.     Left lower leg: No edema.  Lymphadenopathy:     Cervical: No cervical adenopathy.  Skin:    General: Skin is warm and dry.     Coloration: Skin is not jaundiced or pale.     Findings: No bruising, erythema, lesion or rash.  Neurological:     General: No focal deficit present.     Mental Status: She is alert and oriented to person, place, and time.     Cranial Nerves: No cranial nerve deficit.     Sensory: No sensory deficit.     Motor: No weakness.     Coordination: Coordination normal.     Gait: Gait normal.     Deep Tendon Reflexes: Reflexes normal.  Psychiatric:        Mood and Affect: Mood normal.        Behavior: Behavior normal.        Thought Content: Thought content normal.        Judgment: Judgment normal.       Assessment & Plan:  1. Routine general medical examination at a health care facility - encouraged lifestyle modifications  - Follow up in one year or sooner if needed  2. Hypothyroidism,  unspecified type - consider dose change of synthroid  - CBC with Differential/Platelet; Future - Comprehensive metabolic panel; Future - Lipid panel; Future - TSH; Future  3. Plasmacytoma not having achieved remission, unspecified plasmacytoma type ([place]) - per oncology  - CBC with Differential/Platelet; Future - Comprehensive metabolic panel; Future - Lipid panel; Future - TSH; Future  4. Insomnia, unspecified type  Discussed increasing dose but she  would like to stay on current dose  - traZODone (DESYREL) 50 MG tablet; TAKE HALF TO 1 TABLET BY MOUTH AT BEDTIME AS NEEDED FOR SLEEP  Dispense: 90 tablet; Refill: 1 - CBC with Differential/Platelet; Future - Comprehensive metabolic panel; Future - Lipid panel; Future - TSH; Future  5. Colon cancer screening - Can order when cleared by oncology   Dorothyann Peng, NP

## 2021-12-19 NOTE — Patient Instructions (Signed)
It was great seeing you today   We will follow up with you regarding your lab work   Please let me know if you need anything   

## 2021-12-20 ENCOUNTER — Encounter: Payer: 59 | Admitting: Adult Health

## 2021-12-27 ENCOUNTER — Other Ambulatory Visit: Payer: Self-pay | Admitting: Hematology and Oncology

## 2021-12-27 ENCOUNTER — Other Ambulatory Visit: Payer: Self-pay | Admitting: *Deleted

## 2021-12-27 MED ORDER — LENALIDOMIDE 2.5 MG PO CAPS: ORAL_CAPSULE | ORAL | 0 refills | Status: DC

## 2022-01-02 ENCOUNTER — Other Ambulatory Visit: Payer: Self-pay | Admitting: *Deleted

## 2022-01-02 MED ORDER — REVLIMID 2.5 MG PO CAPS: 2.5000 mg | ORAL_CAPSULE | Freq: Every day | ORAL | 0 refills | Status: DC

## 2022-01-22 ENCOUNTER — Other Ambulatory Visit: Payer: Self-pay | Admitting: Adult Health

## 2022-01-29 ENCOUNTER — Other Ambulatory Visit: Payer: Self-pay

## 2022-01-29 ENCOUNTER — Other Ambulatory Visit: Payer: Self-pay | Admitting: Hematology and Oncology

## 2022-01-29 MED ORDER — REVLIMID 2.5 MG PO CAPS: 2.5000 mg | ORAL_CAPSULE | Freq: Every day | ORAL | 0 refills | Status: DC

## 2022-01-30 ENCOUNTER — Telehealth: Payer: Self-pay | Admitting: Hematology and Oncology

## 2022-01-30 NOTE — Telephone Encounter (Signed)
Called patient per 1/24 staff message to schedule appointments for 2/1. Patient scheduled and notified.

## 2022-02-06 ENCOUNTER — Other Ambulatory Visit: Payer: Self-pay

## 2022-02-06 ENCOUNTER — Inpatient Hospital Stay: Payer: 59 | Attending: Hematology and Oncology

## 2022-02-06 ENCOUNTER — Inpatient Hospital Stay (HOSPITAL_BASED_OUTPATIENT_CLINIC_OR_DEPARTMENT_OTHER): Payer: 59 | Admitting: Hematology and Oncology

## 2022-02-06 ENCOUNTER — Other Ambulatory Visit: Payer: Self-pay | Admitting: Hematology and Oncology

## 2022-02-06 VITALS — BP 141/77 | HR 69 | Temp 97.3°F | Resp 20 | Wt 170.1 lb

## 2022-02-06 DIAGNOSIS — R7989 Other specified abnormal findings of blood chemistry: Secondary | ICD-10-CM | POA: Diagnosis not present

## 2022-02-06 DIAGNOSIS — N289 Disorder of kidney and ureter, unspecified: Secondary | ICD-10-CM | POA: Insufficient documentation

## 2022-02-06 DIAGNOSIS — D649 Anemia, unspecified: Secondary | ICD-10-CM | POA: Diagnosis not present

## 2022-02-06 DIAGNOSIS — Z79899 Other long term (current) drug therapy: Secondary | ICD-10-CM | POA: Insufficient documentation

## 2022-02-06 DIAGNOSIS — I952 Hypotension due to drugs: Secondary | ICD-10-CM

## 2022-02-06 DIAGNOSIS — M545 Low back pain, unspecified: Secondary | ICD-10-CM | POA: Insufficient documentation

## 2022-02-06 DIAGNOSIS — D72822 Plasmacytosis: Secondary | ICD-10-CM | POA: Insufficient documentation

## 2022-02-06 DIAGNOSIS — M792 Neuralgia and neuritis, unspecified: Secondary | ICD-10-CM | POA: Diagnosis not present

## 2022-02-06 DIAGNOSIS — C9001 Multiple myeloma in remission: Secondary | ICD-10-CM | POA: Diagnosis not present

## 2022-02-06 LAB — CBC WITH DIFFERENTIAL (CANCER CENTER ONLY)
Abs Immature Granulocytes: 0 10*3/uL (ref 0.00–0.07)
Basophils Absolute: 0.1 10*3/uL (ref 0.0–0.1)
Basophils Relative: 3 %
Eosinophils Absolute: 0.1 10*3/uL (ref 0.0–0.5)
Eosinophils Relative: 5 %
HCT: 34.1 % — ABNORMAL LOW (ref 36.0–46.0)
Hemoglobin: 12 g/dL (ref 12.0–15.0)
Immature Granulocytes: 0 %
Lymphocytes Relative: 31 %
Lymphs Abs: 0.7 10*3/uL (ref 0.7–4.0)
MCH: 30.8 pg (ref 26.0–34.0)
MCHC: 35.2 g/dL (ref 30.0–36.0)
MCV: 87.7 fL (ref 80.0–100.0)
Monocytes Absolute: 0.2 10*3/uL (ref 0.1–1.0)
Monocytes Relative: 11 %
Neutro Abs: 1.1 10*3/uL — ABNORMAL LOW (ref 1.7–7.7)
Neutrophils Relative %: 50 %
Platelet Count: 122 10*3/uL — ABNORMAL LOW (ref 150–400)
RBC: 3.89 MIL/uL (ref 3.87–5.11)
RDW: 14.1 % (ref 11.5–15.5)
WBC Count: 2.2 10*3/uL — ABNORMAL LOW (ref 4.0–10.5)
nRBC: 0 % (ref 0.0–0.2)

## 2022-02-06 LAB — CMP (CANCER CENTER ONLY)
ALT: 11 U/L (ref 0–44)
AST: 13 U/L — ABNORMAL LOW (ref 15–41)
Albumin: 4.3 g/dL (ref 3.5–5.0)
Alkaline Phosphatase: 74 U/L (ref 38–126)
Anion gap: 8 (ref 5–15)
BUN: 26 mg/dL — ABNORMAL HIGH (ref 8–23)
CO2: 24 mmol/L (ref 22–32)
Calcium: 9.3 mg/dL (ref 8.9–10.3)
Chloride: 106 mmol/L (ref 98–111)
Creatinine: 1.85 mg/dL — ABNORMAL HIGH (ref 0.44–1.00)
GFR, Estimated: 30 mL/min — ABNORMAL LOW (ref >60)
Glucose, Bld: 85 mg/dL (ref 70–99)
Potassium: 4.1 mmol/L (ref 3.5–5.1)
Sodium: 138 mmol/L (ref 135–145)
Total Bilirubin: 0.6 mg/dL (ref 0.3–1.2)
Total Protein: 6.8 g/dL (ref 6.5–8.1)

## 2022-02-06 LAB — LACTATE DEHYDROGENASE: LDH: 135 U/L (ref 98–192)

## 2022-02-06 NOTE — Progress Notes (Signed)
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  Serological testing concerning for a plasma cell neoplasm, with UPEP showing 7.4 g of protein daily with markedly high lambda light chains -11/07/2020: Bone marrow biopsy performed, shows no evidence of multiple myeloma -11/23/2020: Biopsy of right clavicle mass confirmed plasmacytoma.  -11/27/2020: PET scan show intensely hypermetabolic large soft tissue masses arising from the LEFT and RIGHT ribs. Two large lesions in the LEFT chest wall and a single large lesion in the RIGHT chest wall.Hypermetabolic expansile solid masses arising from the medial RIGHT clavicle and RIGHT inferior pubic ischium 01/09/2021: Cycle 1, Day 1 CyBorD 02/06/2021: Cycle 2, Day 1 CyBorD 03/06/2021: Cycle 3, Day 1 CyBorD 04/03/2021: Cycle 4, Day 1 CyBorD  05/01/2021: Cycle 5, Day 1 CyBorD  05/29/2021: Cycle 6, Day 1 CyBorD  06/26/2021: Cycle 7, Day 1 CyBorD  07/29/2021: Started maintenance Revlimid 5 mg once daily, 21 days on, 7 days off every 28 days.   Interval History:  Jordan Gregory 68 y.o. 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 The patient was last seen on 11/04/2021.  In the interim, she has continued maintenance Revlimid therapy.   On exam today Jordan Gregory reports she has been well in the interim since her last visit in October.  She reports that her energy is improving and  she is actually been able to stop her gabapentin as she is no longer having the neuropathy/abdominal tenderness.  She reports she is been going on hikes and feels quite good.  She is able to do more around the house.  She notes that she is "85% back".  She does still get some occasional bouts of tiredness and reports that activities such as vacuuming can be exhausting.  She reports has not had any issues with bleeding, bruising, or dark stools.  She notes that she is eating well and taking her Revlimid 2.5 mg daily without difficulty.  Overall she is steadily improving.  She denies fevers, chills, night sweats, shortness of breath, chest pain or cough.  She has no other complaints.  Rest of the 10 point ROS is below.  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[place])    Thyroid disease    Vitamin D deficiency     SURGICAL HISTORY: Past Surgical History:  Procedure Laterality Date   MOHS SURGERY  2016    SOCIAL HISTORY: Social History   Socioeconomic History   Marital status: Married    Spouse name: Not on file   Number of children: Not on file   Years of education: Not on file   Highest education level: Not on file  Occupational History   Not on file  Tobacco Use   Smoking status: Never   Smokeless tobacco: Never  Vaping Use   Vaping Use: Never used  Substance and Sexual Activity   Alcohol use: Yes    Alcohol/week: 5.0 standard drinks of alcohol    Types:  5 Standard drinks or equivalent per week    Comment: regular   Drug use: No   Sexual activity: Yes    Birth control/protection: Post-menopausal    Comment: 1st intercourse 11 yo-5 partners  Other Topics Concern   Not on file  Social History Narrative   She works at [employer] and [employer].    Married - 23 years    No kids       She likes to travel, read, cycle, be outside.    Social Determinants of Health   Financial Resource Strain: Not on file  Food Insecurity:  Not on file  Transportation Needs: No Transportation Needs (10/18/2020)   PRAPARE - Hydrologist (Medical): No    Lack of Transportation (Non-Medical): No  Physical Activity: Not on file  Stress: Not on file  Social Connections: Not on file  Intimate Partner Violence: Not At Risk (12/04/2020)   Humiliation, Afraid, Rape, and Kick questionnaire    Fear of Current or Ex-Partner: No    Emotionally Abused: No    Physically Abused: No    Sexually Abused: No    FAMILY HISTORY: Family History  Problem Relation Age of Onset   Arthritis Mother    Hypertension Mother    Dementia Mother    Heart disease Father    Liver cancer Father    Asperger's syndrome Brother    Atrial fibrillation Brother     ALLERGIES:  is allergic to ramipril.  MEDICATIONS:  Current Outpatient Medications  Medication Sig Dispense Refill   amLODipine (NORVASC) 10 MG tablet Take 10 mg by mouth daily. (Patient not taking: Reported on 12/19/2021)     aspirin EC 81 MG tablet Take 81 mg by mouth daily. Swallow whole.     calcium carbonate (OS-CAL) 600 MG TABS Take 600 mg by mouth daily.     citalopram (CELEXA) 10 MG tablet TAKE 1 TABLET BY MOUTH EVERY DAY 90 tablet 0   gabapentin (NEURONTIN) 300 MG capsule TAKE 1 CAPSULE BY MOUTH THREE TIMES A DAY 90 capsule 1   levothyroxine (SYNTHROID) 75 MCG tablet TAKE 1 TABLET BY MOUTH EVERY DAY BEFORE BREAKFAST 90 tablet 3   ondansetron (ZOFRAN) 8 MG tablet Take 1 tablet (8 mg total) by mouth every 8 (eight) hours as needed for nausea or vomiting. (Patient not taking: Reported on 12/19/2021) 60 tablet 2   prochlorperazine (COMPAZINE) 10 MG tablet Take 1 tablet (10 mg total) by mouth every 6 (six) hours as needed for nausea or vomiting. (Patient not taking: Reported on 12/19/2021) 60 tablet 2   REVLIMID 2.5 MG capsule Take 1 capsule (2.5 mg total) by mouth daily. [employer] Auth #  [id number]   Date Obtained 01/29/2022 Take 1 for 21 days none for 7 days 21  capsule 0   traZODone (DESYREL) 50 MG tablet TAKE HALF TO 1 TABLET BY MOUTH AT BEDTIME AS NEEDED FOR SLEEP 90 tablet 1   VITAMIN D, CHOLECALCIFEROL, PO Take 2,000 Int'l Units by mouth daily.     No current facility-administered medications for this visit.    REVIEW OF SYSTEMS:   Constitutional: ( - ) fevers, ( - )  chills , ( - ) night sweats Eyes: ( - ) blurriness of vision, ( - ) double vision, ( - ) watery eyes Ears, nose, mouth, throat, and face: ( - ) mucositis, ( - ) sore throat Respiratory: ( - ) cough, ( - ) dyspnea, ( - ) wheezes Cardiovascular: ( - )  palpitation, ( - ) chest discomfort, ( - ) lower extremity swelling Gastrointestinal:  ( - ) nausea, ( - ) heartburn, ( + ) change in bowel habits Skin: ( - ) abnormal skin rashes Lymphatics: ( - ) new lymphadenopathy, ( - ) easy bruising Neurological: ( + ) numbness, ( - ) tingling, ( - ) new weaknesses Behavioral/Psych: ( - ) mood change, ( - ) new changes  All other systems were reviewed with the patient and are negative.  PHYSICAL EXAMINATION: ECOG PERFORMANCE STATUS: 1 - Symptomatic but completely ambulatory  Vitals:   02/06/22 1310  BP: (!) 141/77  Pulse: 69  Resp: 20  Temp: (!) 97.3 F (36.3 C)  SpO2: 100%    Filed Weights   02/06/22 1310  Weight: 170 lb 1.6 oz (77.2 kg)     GENERAL: Well-appearing middle-age Caucasian female, alert, no distress and comfortable SKIN: skin color, texture, turgor are normal, no rashes or significant lesions EYES: conjunctiva are pink and non-injected, sclera clear CHEST: mass over right clavicle, firm but decreasing in size  LUNGS: clear to auscultation and percussion with normal breathing effort HEART: regular rate & rhythm and no murmurs and no lower extremity edema Musculoskeletal: no cyanosis of digits and no clubbing  PSYCH: alert & oriented x 3, fluent speech NEURO: no focal motor/sensory deficits  LABORATORY DATA:  I have reviewed the data as listed    Latest Ref  Rng & Units 02/06/2022   12:43 PM 12/19/2021   11:37 AM 11/04/2021   10:49 AM  CBC  WBC 4.0 - 10.5 K/uL 2.2  2.0 Repeated and verified X2.  1.9   Hemoglobin 12.0 - 15.0 g/dL 12.0  11.7  11.6   Hematocrit 36.0 - 46.0 % 34.1  33.9  33.0   Platelets 150 - 400 K/uL 122  157.0  110        Latest Ref Rng & Units 02/06/2022   12:43 PM 12/19/2021   11:37 AM 11/04/2021   10:49 AM  CMP  Glucose 70 - 99 mg/dL 85  89  94   BUN 8 - 23 mg/dL 26  25  29   $ Creatinine 0.44 - 1.00 mg/dL 1.85  1.76  2.08   Sodium 135 - 145 mmol/L 138  140  138   Potassium 3.5 - 5.1 mmol/L 4.1  4.4  3.9   Chloride 98 - 111 mmol/L 106  107  105   CO2 22 - 32 mmol/L 24  26  24   $ Calcium 8.9 - 10.3 mg/dL 9.3  9.2  9.2   Total Protein 6.5 - 8.1 g/dL 6.8  6.5  7.1   Total Bilirubin 0.3 - 1.2 mg/dL 0.6  0.5  0.5   Alkaline Phos 38 - 126 U/L 74  62  74   AST 15 - 41 U/L 13  13  14   $ ALT 0 - 44 U/L 11  10  12     $ Lab Results  Component Value Date   MPROTEIN Not Observed 02/06/2022   MPROTEIN Not Observed 11/04/2021   MPROTEIN Not Observed 10/07/2021   Lab Results  Component Value Date   KPAFRELGTCHN 20.4 (H) 02/06/2022   KPAFRELGTCHN 20.3 (H) 11/04/2021   KPAFRELGTCHN 19.5 (H) 10/07/2021   LAMBDASER 14.7 02/06/2022   LAMBDASER 11.7 11/04/2021   LAMBDASER 14.4 10/07/2021   KAPLAMBRATIO 1.39 02/06/2022   KAPLAMBRATIO 1.74 (H) 11/04/2021   KAPLAMBRATIO 1.35 10/07/2021     RADIOGRAPHIC STUDIES: CT CHEST ABDOMEN PELVIS WO  CONTRAST  Result Date: 02/15/2022 CLINICAL DATA:  Hematologic malignancy, assess treatment response. Multiple myeloma in remission. A few months of left rib pain * Tracking Code: BO * EXAM: CT CHEST, ABDOMEN AND PELVIS WITHOUT CONTRAST TECHNIQUE: Multidetector CT imaging of the chest, abdomen and pelvis was performed following the standard protocol without IV contrast. RADIATION DOSE REDUCTION: This exam was performed according to the departmental dose-optimization program which includes automated  exposure control, adjustment of the mA and/or kV according to patient size and/or use of iterative reconstruction technique. COMPARISON:  Multiple priors including CT June 06, 2021. FINDINGS: CT CHEST FINDINGS Cardiovascular: Normal caliber thoracic aorta. Normal size heart. Coronary artery calcifications. No significant pericardial effusion/thickening. Mediastinum/Nodes: No suspicious thyroid nodule. No pathologically enlarged mediastinal, hilar or axillary lymph nodes, noting limited sensitivity for the detection of hilar adenopathy on this noncontrast study. The esophagus is grossly unremarkable. Lungs/Pleura: Scattered subsegmental atelectasis/scarring predominantly involving the lung bases. No suspicious pulmonary nodules or masses. No pleural effusion. No pneumothorax. Musculoskeletal: Similar appearance of the multiple myelomatous bone lesions notably involving the right clavicular head, right seventh rib, left second rib and left fourth rib. No new aggressive lytic or blastic lesion of bone identified. CT ABDOMEN PELVIS FINDINGS Hepatobiliary: Stable cyst in the right hepatic lobe on image 52/2. No new suspicious hepatic lesion on noncontrast enhanced examination. Gallbladder is unremarkable. No biliary ductal dilation. Pancreas: No pancreatic ductal dilation or evidence of acute inflammation. Spleen: No splenomegaly. Adrenals/Urinary Tract: Bilateral adrenal glands appear normal. No hydronephrosis. No renal, ureteral or bladder calculi. Urinary bladder is unremarkable for degree of distension. Stomach/Bowel: Stomach is unremarkable for degree of distension. No pathologic dilation of small or large bowel. Normal appendix. No evidence of acute bowel inflammation. Vascular/Lymphatic: Normal caliber abdominal aorta. Smooth IVC contours. No pathologically enlarged abdominal or pelvic lymph nodes. Reproductive: Uterine leiomyomas.  No suspicious adnexal mass. Other: No significant abdominopelvic free fluid.  Musculoskeletal: No significant interval change in the myelomatous lesions involving the pelvis. No new aggressive lytic or blastic lesion of bone. IMPRESSION: 1. No significant interval change in the multifocal myelomatous osseous lesions. No new aggressive lytic or blastic lesion of bone identified. 2. No evidence of extraosseous disease within the chest, abdomen or pelvis on noncontrast enhanced CT. Electronically Signed   By: Dahlia Bailiff M.D.   On: 02/15/2022 07:59    ASSESSMENT & PLAN Jordan Gregory 67 y.o. 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  Previously we discussed the diagnosis and treatment options for plasmacytomas without multiple myeloma. This includes radiation therapy verus chemotherapy. Dr. Lorenso Courier discussed case with Dr. Maylene Roes from [facility] and the recommendation was to proceed with chemotherapy due to patients decline in renal function.  Given the patient's kidney dysfunction at this time the treatment of choice was CyBorD chemotherapy.  Patient received 7 cycles of CyBorD from 01/09/2021-06/26/2021.   Jordan Gregory transitioned to maintenance Revlimid 5 mg PO daily starting 07/29/2021.   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   --Serological studies are confirmatory of a plasma cell neoplasm with markedly high lambda light chains and proteinuria.  --Bone marrow biopsy from 11/07/2020 showed no evidence of multiple myeloma. Right clavicle biopsy from 11/23/2020 confirmed plasma cell neoplasm consistent with plasmacytomas.  --PET scan from 11/27/2020 showed multifocal hypermetabolic soft tissue massing arising from the left and right ribs, left and right chest wall, right clavicle and right  inferior pubic ischium. Largest is solid mass in the anterior left chest wall measuring 10.2 x 9.9 cm.  --Recommended to initiate  systemic chemotherapy due to acute renal dysfunction.  --Received 7 cycles of CyBorD from 01/09/2021 to 06/26/2021 --Starting 07/29/2021, started maintenance therapy revlimid 5 mg PO daily 21 days on 7 days off.  Plan: --Most recent M protein was undetectable on 11/04/2021. Lambda light chain 11.7 with ratio 1.74  --Labs today were reviewed and showed WBC 2.2, Hgb 12.0, MCV 87.7, Plt 122 --continue Revlimid dose 2.5 mg   --RTC in 4 weeks with labs  #Neutropenia/Anemia: --Secondary to revlimid pills/poor kidney function  --Hgb 12.0 and ANC 1.1 today -- Decreased dose of Revlimid to try to improve this.  #Renal dysfunction: --Secondary to damage from malignancy.  --Creatinine is 1.85, improving --Continue to monitor closely.   #Low back pain with neuropathic pain to bilateral legs: --Back pain has not improved since completion of palliative radiation. --Has oxycodone 5 mg PRN but hesitant to take due to existing constipation --Currently on gabapentin 300 mg TID with plans to increase 600 mg TID.  --Under the care of Dr. Mickeal Skinner in neurology.   #Hypotension-resolved: --Today's BP was 141/77 --Patient reports dizzy spells and near syncopal episodes. --Currently on Norvasc 10 mg daily, prescribed by nephrologist.  --Recommend to hold Norvasc, check BP daily at home,and f/u with nephrologist (Dr. Gean Quint) if she needs to resume BP medication  #Supportive Care -- port placement not required -- zofran 39m q8H PRN and compazine 164mPO q6H for nausea -- acyclovir 40077mO BID for VCZ prophylaxis -- stool softeners/laxatives for constipation as needed  No orders of the defined types were placed in this encounter.  All questions were answered. The patient knows to call the clinic with any problems, questions or concerns.  I have spent a total of 30 minutes minutes of face-to-face and non-face-to-face time, preparing to see the patient, performing a medically appropriate examination,  counseling and educating the patient, ordering medications, documenting clinical information in the electronic health record,  and care coordination.   JohLedell PeoplesD Department of Hematology/Oncology [facility] Surgery Centerone: [phone]: [phone]: [email or web address]

## 2022-02-07 ENCOUNTER — Telehealth: Payer: Self-pay | Admitting: Hematology and Oncology

## 2022-02-07 LAB — KAPPA/LAMBDA LIGHT CHAINS
Kappa free light chain: 20.4 mg/L — ABNORMAL HIGH (ref 3.3–19.4)
Kappa, lambda light chain ratio: 1.39 (ref 0.26–1.65)
Lambda free light chains: 14.7 mg/L (ref 5.7–26.3)

## 2022-02-07 LAB — BETA 2 MICROGLOBULIN, SERUM: Beta-2 Microglobulin: 3.3 mg/L — ABNORMAL HIGH (ref 0.6–2.4)

## 2022-02-07 NOTE — Telephone Encounter (Signed)
Called patient per 2/1 los notes to schedule f/u. Patient scheduled and notified.

## 2022-02-11 LAB — MULTIPLE MYELOMA PANEL, SERUM
Albumin SerPl Elph-Mcnc: 4.1 g/dL (ref 2.9–4.4)
Albumin/Glob SerPl: 1.9 — ABNORMAL HIGH (ref 0.7–1.7)
Alpha 1: 0.2 g/dL (ref 0.0–0.4)
Alpha2 Glob SerPl Elph-Mcnc: 0.6 g/dL (ref 0.4–1.0)
B-Globulin SerPl Elph-Mcnc: 0.8 g/dL (ref 0.7–1.3)
Gamma Glob SerPl Elph-Mcnc: 0.6 g/dL (ref 0.4–1.8)
Globulin, Total: 2.2 g/dL (ref 2.2–3.9)
IgA: 34 mg/dL — ABNORMAL LOW (ref 87–352)
IgG (Immunoglobin G), Serum: 724 mg/dL (ref 586–1602)
IgM (Immunoglobulin M), Srm: 26 mg/dL (ref 26–217)
Total Protein ELP: 6.3 g/dL (ref 6.0–8.5)

## 2022-02-14 ENCOUNTER — Ambulatory Visit (HOSPITAL_COMMUNITY)
Admission: RE | Admit: 2022-02-14 | Discharge: 2022-02-14 | Disposition: A | Discharge status: Home / Self Care | Payer: 59 | Source: Ambulatory Visit | Attending: Hematology and Oncology | Admitting: Hematology and Oncology

## 2022-02-14 ENCOUNTER — Other Ambulatory Visit: Payer: Self-pay | Admitting: Hematology and Oncology

## 2022-02-14 DIAGNOSIS — C9001 Multiple myeloma in remission: Secondary | ICD-10-CM | POA: Diagnosis present

## 2022-02-16 ENCOUNTER — Encounter: Payer: Self-pay | Admitting: Hematology and Oncology

## 2022-02-17 ENCOUNTER — Telehealth: Payer: Self-pay

## 2022-02-17 NOTE — Telephone Encounter (Addendum)
Called patient to review message below.  ----- Message from Orson Slick, MD sent at 02/16/2022  5:38 PM EST ----- Please let Mrs. Mcclay know that her CT scan results are stable. No evidence of progression of any of her myeloma bone lesions.  We will see her later this month for continued labs and in April 2024 for a follow up visit.   ----- Message ----- From: Buel Ream, Rad Results In Sent: 02/15/2022   8:01 AM EST To: Orson Slick, MD

## 2022-02-28 ENCOUNTER — Other Ambulatory Visit: Payer: Self-pay | Admitting: Hematology and Oncology

## 2022-02-28 ENCOUNTER — Other Ambulatory Visit: Payer: Self-pay

## 2022-02-28 MED ORDER — REVLIMID 2.5 MG PO CAPS: 2.5000 mg | ORAL_CAPSULE | Freq: Every day | ORAL | 0 refills | Status: DC

## 2022-03-04 ENCOUNTER — Inpatient Hospital Stay: Payer: 59

## 2022-03-04 DIAGNOSIS — D72822 Plasmacytosis: Secondary | ICD-10-CM | POA: Diagnosis not present

## 2022-03-04 DIAGNOSIS — C9001 Multiple myeloma in remission: Secondary | ICD-10-CM

## 2022-03-04 LAB — CBC WITH DIFFERENTIAL (CANCER CENTER ONLY)
Abs Immature Granulocytes: 0 10*3/uL (ref 0.00–0.07)
Basophils Absolute: 0.1 10*3/uL (ref 0.0–0.1)
Basophils Relative: 3 %
Eosinophils Absolute: 0.1 10*3/uL (ref 0.0–0.5)
Eosinophils Relative: 7 %
HCT: 34.8 % — ABNORMAL LOW (ref 36.0–46.0)
Hemoglobin: 11.9 g/dL — ABNORMAL LOW (ref 12.0–15.0)
Immature Granulocytes: 0 %
Lymphocytes Relative: 35 %
Lymphs Abs: 0.6 10*3/uL — ABNORMAL LOW (ref 0.7–4.0)
MCH: 30.4 pg (ref 26.0–34.0)
MCHC: 34.2 g/dL (ref 30.0–36.0)
MCV: 89 fL (ref 80.0–100.0)
Monocytes Absolute: 0.2 10*3/uL (ref 0.1–1.0)
Monocytes Relative: 12 %
Neutro Abs: 0.8 10*3/uL — ABNORMAL LOW (ref 1.7–7.7)
Neutrophils Relative %: 43 %
Platelet Count: 132 10*3/uL — ABNORMAL LOW (ref 150–400)
RBC: 3.91 MIL/uL (ref 3.87–5.11)
RDW: 14.4 % (ref 11.5–15.5)
WBC Count: 1.8 10*3/uL — ABNORMAL LOW (ref 4.0–10.5)
nRBC: 0 % (ref 0.0–0.2)

## 2022-03-04 LAB — CMP (CANCER CENTER ONLY)
ALT: 12 U/L (ref 0–44)
AST: 14 U/L — ABNORMAL LOW (ref 15–41)
Albumin: 4.2 g/dL (ref 3.5–5.0)
Alkaline Phosphatase: 64 U/L (ref 38–126)
Anion gap: 6 (ref 5–15)
BUN: 24 mg/dL — ABNORMAL HIGH (ref 8–23)
CO2: 26 mmol/L (ref 22–32)
Calcium: 8.7 mg/dL — ABNORMAL LOW (ref 8.9–10.3)
Chloride: 111 mmol/L (ref 98–111)
Creatinine: 1.74 mg/dL — ABNORMAL HIGH (ref 0.44–1.00)
GFR, Estimated: 32 mL/min — ABNORMAL LOW (ref >60)
Glucose, Bld: 78 mg/dL (ref 70–99)
Potassium: 4.6 mmol/L (ref 3.5–5.1)
Sodium: 143 mmol/L (ref 135–145)
Total Bilirubin: 0.4 mg/dL (ref 0.3–1.2)
Total Protein: 6.5 g/dL (ref 6.5–8.1)

## 2022-03-04 LAB — LACTATE DEHYDROGENASE: LDH: 116 U/L (ref 98–192)

## 2022-03-05 LAB — KAPPA/LAMBDA LIGHT CHAINS
Kappa free light chain: 21.5 mg/L — ABNORMAL HIGH (ref 3.3–19.4)
Kappa, lambda light chain ratio: 1.36 (ref 0.26–1.65)
Lambda free light chains: 15.8 mg/L (ref 5.7–26.3)

## 2022-03-05 LAB — BETA 2 MICROGLOBULIN, SERUM: Beta-2 Microglobulin: 2.9 mg/L — ABNORMAL HIGH (ref 0.6–2.4)

## 2022-03-07 ENCOUNTER — Other Ambulatory Visit: Payer: Self-pay | Admitting: Adult Health

## 2022-03-07 DIAGNOSIS — F419 Anxiety disorder, unspecified: Secondary | ICD-10-CM

## 2022-03-11 LAB — MULTIPLE MYELOMA PANEL, SERUM
Albumin SerPl Elph-Mcnc: 4.1 g/dL (ref 2.9–4.4)
Albumin/Glob SerPl: 2.1 — ABNORMAL HIGH (ref 0.7–1.7)
Alpha 1: 0.2 g/dL (ref 0.0–0.4)
Alpha2 Glob SerPl Elph-Mcnc: 0.5 g/dL (ref 0.4–1.0)
B-Globulin SerPl Elph-Mcnc: 0.7 g/dL (ref 0.7–1.3)
Gamma Glob SerPl Elph-Mcnc: 0.6 g/dL (ref 0.4–1.8)
Globulin, Total: 2 g/dL — ABNORMAL LOW (ref 2.2–3.9)
IgA: 35 mg/dL — ABNORMAL LOW (ref 87–352)
IgG (Immunoglobin G), Serum: 695 mg/dL (ref 586–1602)
IgM (Immunoglobulin M), Srm: 23 mg/dL — ABNORMAL LOW (ref 26–217)
Total Protein ELP: 6.1 g/dL (ref 6.0–8.5)

## 2022-03-26 ENCOUNTER — Other Ambulatory Visit: Payer: Self-pay | Admitting: Hematology and Oncology

## 2022-03-26 ENCOUNTER — Other Ambulatory Visit: Payer: Self-pay | Admitting: *Deleted

## 2022-03-26 DIAGNOSIS — C9000 Multiple myeloma not having achieved remission: Secondary | ICD-10-CM

## 2022-03-26 MED ORDER — REVLIMID 2.5 MG PO CAPS: 2.5000 mg | ORAL_CAPSULE | Freq: Every day | ORAL | 0 refills | Status: DC

## 2022-04-01 ENCOUNTER — Inpatient Hospital Stay: Payer: 59 | Attending: Hematology and Oncology

## 2022-04-01 DIAGNOSIS — C9030 Solitary plasmacytoma not having achieved remission: Secondary | ICD-10-CM | POA: Insufficient documentation

## 2022-04-01 DIAGNOSIS — C9001 Multiple myeloma in remission: Secondary | ICD-10-CM

## 2022-04-01 LAB — CMP (CANCER CENTER ONLY)
ALT: 13 U/L (ref 0–44)
AST: 15 U/L (ref 15–41)
Albumin: 4.5 g/dL (ref 3.5–5.0)
Alkaline Phosphatase: 74 U/L (ref 38–126)
Anion gap: 8 (ref 5–15)
BUN: 29 mg/dL — ABNORMAL HIGH (ref 8–23)
CO2: 24 mmol/L (ref 22–32)
Calcium: 9.4 mg/dL (ref 8.9–10.3)
Chloride: 108 mmol/L (ref 98–111)
Creatinine: 1.91 mg/dL — ABNORMAL HIGH (ref 0.44–1.00)
GFR, Estimated: 28 mL/min — ABNORMAL LOW
Glucose, Bld: 99 mg/dL (ref 70–99)
Potassium: 4.2 mmol/L (ref 3.5–5.1)
Sodium: 140 mmol/L (ref 135–145)
Total Bilirubin: 0.5 mg/dL (ref 0.3–1.2)
Total Protein: 6.8 g/dL (ref 6.5–8.1)

## 2022-04-01 LAB — CBC WITH DIFFERENTIAL (CANCER CENTER ONLY)
Abs Immature Granulocytes: 0.01 10*3/uL (ref 0.00–0.07)
Basophils Absolute: 0.1 10*3/uL (ref 0.0–0.1)
Basophils Relative: 3 %
Eosinophils Absolute: 0.1 10*3/uL (ref 0.0–0.5)
Eosinophils Relative: 8 %
HCT: 37.1 % (ref 36.0–46.0)
Hemoglobin: 12.8 g/dL (ref 12.0–15.0)
Immature Granulocytes: 1 %
Lymphocytes Relative: 31 %
Lymphs Abs: 0.6 10*3/uL — ABNORMAL LOW (ref 0.7–4.0)
MCH: 30.3 pg (ref 26.0–34.0)
MCHC: 34.5 g/dL (ref 30.0–36.0)
MCV: 87.9 fL (ref 80.0–100.0)
Monocytes Absolute: 0.2 10*3/uL (ref 0.1–1.0)
Monocytes Relative: 11 %
Neutro Abs: 0.8 10*3/uL — ABNORMAL LOW (ref 1.7–7.7)
Neutrophils Relative %: 46 %
Platelet Count: 143 10*3/uL — ABNORMAL LOW (ref 150–400)
RBC: 4.22 MIL/uL (ref 3.87–5.11)
RDW: 14.1 % (ref 11.5–15.5)
WBC Count: 1.8 10*3/uL — ABNORMAL LOW (ref 4.0–10.5)
nRBC: 0 % (ref 0.0–0.2)

## 2022-04-01 LAB — LACTATE DEHYDROGENASE: LDH: 114 U/L (ref 98–192)

## 2022-04-02 LAB — KAPPA/LAMBDA LIGHT CHAINS
Kappa free light chain: 22.8 mg/L — ABNORMAL HIGH (ref 3.3–19.4)
Kappa, lambda light chain ratio: 1.37 (ref 0.26–1.65)
Lambda free light chains: 16.7 mg/L (ref 5.7–26.3)

## 2022-04-02 LAB — BETA 2 MICROGLOBULIN, SERUM: Beta-2 Microglobulin: 2.4 mg/L (ref 0.6–2.4)

## 2022-04-07 LAB — MULTIPLE MYELOMA PANEL, SERUM
Albumin SerPl Elph-Mcnc: 4 g/dL (ref 2.9–4.4)
Albumin/Glob SerPl: 1.8 — ABNORMAL HIGH (ref 0.7–1.7)
Alpha 1: 0.2 g/dL (ref 0.0–0.4)
Alpha2 Glob SerPl Elph-Mcnc: 0.6 g/dL (ref 0.4–1.0)
B-Globulin SerPl Elph-Mcnc: 0.7 g/dL (ref 0.7–1.3)
Gamma Glob SerPl Elph-Mcnc: 0.7 g/dL (ref 0.4–1.8)
Globulin, Total: 2.3 g/dL (ref 2.2–3.9)
IgA: 36 mg/dL — ABNORMAL LOW (ref 87–352)
IgG (Immunoglobin G), Serum: 759 mg/dL (ref 586–1602)
IgM (Immunoglobulin M), Srm: 22 mg/dL — ABNORMAL LOW (ref 26–217)
Total Protein ELP: 6.3 g/dL (ref 6.0–8.5)

## 2022-04-18 ENCOUNTER — Other Ambulatory Visit: Payer: Self-pay | Admitting: Hematology and Oncology

## 2022-04-18 DIAGNOSIS — C9000 Multiple myeloma not having achieved remission: Secondary | ICD-10-CM

## 2022-04-22 ENCOUNTER — Other Ambulatory Visit: Payer: Self-pay | Admitting: Hematology and Oncology

## 2022-04-22 DIAGNOSIS — C9000 Multiple myeloma not having achieved remission: Secondary | ICD-10-CM

## 2022-04-27 NOTE — Progress Notes (Unsigned)
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  Serological testing concerning for a plasma cell neoplasm, with UPEP showing 7.4 g of protein daily with markedly high lambda light chains -11/07/2020: Bone marrow biopsy performed, shows no evidence of multiple myeloma -11/23/2020: Biopsy of right clavicle mass confirmed plasmacytoma.  -11/27/2020: PET scan show intensely hypermetabolic large soft tissue masses arising from the LEFT and RIGHT ribs. Two large lesions in the LEFT chest wall and a single large lesion in the RIGHT chest wall.Hypermetabolic expansile solid masses arising from the medial RIGHT clavicle and RIGHT inferior pubic ischium 01/09/2021: Cycle 1, Day 1 CyBorD 02/06/2021: Cycle 2, Day 1 CyBorD 03/06/2021: Cycle 3, Day 1 CyBorD 04/03/2021: Cycle 4, Day 1 CyBorD  05/01/2021: Cycle 5, Day 1 CyBorD  05/29/2021: Cycle 6, Day 1 CyBorD  06/26/2021: Cycle 7, Day 1 CyBorD  07/29/2021: Started maintenance Revlimid 5 mg once daily, 21 days on, 7 days off every 28 days.   Interval History:  Jordan Gregory 67 y.o. 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 The patient was last seen on 02/06/2022.  In the interim, she has continued maintenance Revlimid therapy.   On exam today Jordan Gregory reports she is feeling considerably better than she was last year.  She reports she has been getting out and doing some yard  work.  She also has an upcoming trip to [place] for a wedding she is greatly looking forward to.  She notes that her energy levels have been good and currently reports they are about an 8 or 9 out of 10.  She reports that though she can get short of breath when taking long walks and has to sit down and rest.  She also reports seeing have some back pain when standing for prolonged periods or vacuuming.  She notes that she is currently off the gabapentin though she does have some occasional twinges of pain, particularly in her left ribs.  She is eating quite well with her weight increasing to 175 pounds from 170 pounds at her last visit.  She reports that otherwise she has had no major changes in her health.  She denies fevers, chills, night sweats, shortness of breath, chest pain or cough.  She has no other complaints.  Rest of the 10 point ROS is below.  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HCC)    Thyroid disease    Vitamin D deficiency     SURGICAL HISTORY: Past Surgical History:  Procedure Laterality Date   MOHS SURGERY  2016    SOCIAL HISTORY: Social History   Socioeconomic History   Marital status: Married    Spouse name: Not on file   Number of children: Not on file   Years of education: Not on file   Highest education level: Not on file  Occupational History   Not on file  Tobacco Use   Smoking status: Never   Smokeless tobacco:  Never  Vaping Use   Vaping Use: Never used  Substance and Sexual Activity   Alcohol use: Yes    Alcohol/week: 5.0 standard drinks of alcohol    Types: 5 Standard drinks or equivalent per week    Comment: regular   Drug use: No   Sexual activity: Yes    Birth control/protection: Post-menopausal    Comment: 1st intercourse 11 yo-5 partners  Other Topics Concern   Not on file  Social History Narrative   She works at [employer] and [employer].    Married - 23 years    No kids        She likes to travel, read, cycle, be outside.    Social Determinants of Health   Financial Resource Strain: Not on file  Food Insecurity: Not on file  Transportation Needs: No Transportation Needs (10/18/2020)   PRAPARE - Administrator, Civil Service (Medical): No    Lack of Transportation (Non-Medical): No  Physical Activity: Not on file  Stress: Not on file  Social Connections: Not on file  Intimate Partner Violence: Not At Risk (12/04/2020)   Humiliation, Afraid, Rape, and Kick questionnaire    Fear of Current or Ex-Partner: No    Emotionally Abused: No    Physically Abused: No    Sexually Abused: No    FAMILY HISTORY: Family History  Problem Relation Age of Onset   Arthritis Mother    Hypertension Mother    Dementia Mother    Heart disease Father    Liver cancer Father    Asperger's syndrome Brother    Atrial fibrillation Brother     ALLERGIES:  is allergic to ramipril.  MEDICATIONS:  Current Outpatient Medications  Medication Sig Dispense Refill   amLODipine (NORVASC) 10 MG tablet Take 10 mg by mouth daily. (Patient not taking: Reported on 12/19/2021)     aspirin EC 81 MG tablet Take 81 mg by mouth daily. Swallow whole.     calcium carbonate (OS-CAL) 600 MG TABS Take 600 mg by mouth daily.     citalopram (CELEXA) 10 MG tablet TAKE 1 TABLET BY MOUTH EVERY DAY 90 tablet 0   gabapentin (NEURONTIN) 300 MG capsule TAKE 1 CAPSULE BY MOUTH THREE TIMES A DAY 90 capsule 1   lenalidomide (REVLIMID) 2.5 MG capsule TAKE 1 CAPSULE BY MOUTH 1 TIME A DAY FOR 21 DAYS ON THEN 7 DAYS OFF 21 capsule 0   levothyroxine (SYNTHROID) 75 MCG tablet TAKE 1 TABLET BY MOUTH EVERY DAY BEFORE BREAKFAST 90 tablet 3   ondansetron (ZOFRAN) 8 MG tablet Take 1 tablet (8 mg total) by mouth every 8 (eight) hours as needed for nausea or vomiting. (Patient not taking: Reported on 12/19/2021) 60 tablet 2   prochlorperazine (COMPAZINE) 10 MG tablet Take 1 tablet (10 mg total) by mouth every 6  (six) hours as needed for nausea or vomiting. (Patient not taking: Reported on 12/19/2021) 60 tablet 2   traZODone (DESYREL) 50 MG tablet TAKE HALF TO 1 TABLET BY MOUTH AT BEDTIME AS NEEDED FOR SLEEP 90 tablet 1   VITAMIN D, CHOLECALCIFEROL, PO Take 2,000 Int'l Units by mouth daily.     No current facility-administered medications for this visit.    REVIEW OF SYSTEMS:   Constitutional: ( - ) fevers, ( - )  chills , ( - ) night sweats Eyes: ( - ) blurriness of vision, ( - ) double vision, ( - ) watery eyes Ears, nose, mouth, throat, and face: ( - )  mucositis, ( - ) sore throat Respiratory: ( - ) cough, ( - ) dyspnea, ( - ) wheezes Cardiovascular: ( - ) palpitation, ( - ) chest discomfort, ( - ) lower extremity swelling Gastrointestinal:  ( - ) nausea, ( - ) heartburn, ( + ) change in bowel habits Skin: ( - ) abnormal skin rashes Lymphatics: ( - ) new lymphadenopathy, ( - ) easy bruising Neurological: ( + ) numbness, ( - ) tingling, ( - ) new weaknesses Behavioral/Psych: ( - ) mood change, ( - ) new changes  All other systems were reviewed with the patient and are negative.  PHYSICAL EXAMINATION: ECOG PERFORMANCE STATUS: 1 - Symptomatic but completely ambulatory  There were no vitals filed for this visit.   There were no vitals filed for this visit.    GENERAL: Well-appearing middle-age Caucasian female, alert, no distress and comfortable SKIN: skin color, texture, turgor are normal, no rashes or significant lesions EYES: conjunctiva are pink and non-injected, sclera clear CHEST: mass over right clavicle, firm but decreasing in size  LUNGS: clear to auscultation and percussion with normal breathing effort HEART: regular rate & rhythm and no murmurs and no lower extremity edema Musculoskeletal: no cyanosis of digits and no clubbing  PSYCH: alert & oriented x 3, fluent speech NEURO: no focal motor/sensory deficits  LABORATORY DATA:  I have reviewed the data as listed    Latest  Ref Rng & Units 04/01/2022   11:28 AM 03/04/2022   11:30 AM 02/06/2022   12:43 PM  CBC  WBC 4.0 - 10.5 K/uL 1.8  1.8  2.2   Hemoglobin 12.0 - 15.0 g/dL 16.1  09.6  04.5   Hematocrit 36.0 - 46.0 % 37.1  34.8  34.1   Platelets 150 - 400 K/uL 143  132  122        Latest Ref Rng & Units 04/01/2022   11:28 AM 03/04/2022   11:30 AM 02/06/2022   12:43 PM  CMP  Glucose 70 - 99 mg/dL 99  78  85   BUN 8 - 23 mg/dL 29  24  26    Creatinine 0.44 - 1.00 mg/dL 4.09  8.11  9.14   Sodium 135 - 145 mmol/L 140  143  138   Potassium 3.5 - 5.1 mmol/L 4.2  4.6  4.1   Chloride 98 - 111 mmol/L 108  111  106   CO2 22 - 32 mmol/L 24  26  24    Calcium 8.9 - 10.3 mg/dL 9.4  8.7  9.3   Total Protein 6.5 - 8.1 g/dL 6.8  6.5  6.8   Total Bilirubin 0.3 - 1.2 mg/dL 0.5  0.4  0.6   Alkaline Phos 38 - 126 U/L 74  64  74   AST 15 - 41 U/L 15  14  13    ALT 0 - 44 U/L 13  12  11      Lab Results  Component Value Date   MPROTEIN Not Observed 04/01/2022   MPROTEIN Not Observed 03/04/2022   MPROTEIN Not Observed 02/06/2022   Lab Results  Component Value Date   KPAFRELGTCHN 22.8 (H) 04/01/2022   KPAFRELGTCHN 21.5 (H) 03/04/2022   KPAFRELGTCHN 20.4 (H) 02/06/2022   LAMBDASER 16.7 04/01/2022   LAMBDASER 15.8 03/04/2022   LAMBDASER 14.7 02/06/2022   KAPLAMBRATIO 1.37 04/01/2022   KAPLAMBRATIO 1.36 03/04/2022   KAPLAMBRATIO 1.39 02/06/2022     RADIOGRAPHIC STUDIES: No results found.  ASSESSMENT & PLAN Jordan Gregory 67 y.o.  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  Previously we discussed the diagnosis and treatment options for plasmacytomas without multiple myeloma. This includes radiation therapy verus chemotherapy. Dr. Leonides Schanz discussed case with Dr. Alvino Chapel from [facility] and the recommendation was to proceed with chemotherapy due to patients decline in renal function.  Given the patient's kidney dysfunction at this time the  treatment of choice was CyBorD chemotherapy.  Patient received 7 cycles of CyBorD from 01/09/2021-06/26/2021.   Ms. Hurtado transitioned to maintenance Revlimid 5 mg PO daily starting 07/29/2021.   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   --Serological studies are confirmatory of a plasma cell neoplasm with markedly high lambda light chains and proteinuria.  --Bone marrow biopsy from 11/07/2020 showed no evidence of multiple myeloma. Right clavicle biopsy from 11/23/2020 confirmed plasma cell neoplasm consistent with plasmacytomas.  --PET scan from 11/27/2020 showed multifocal hypermetabolic soft tissue massing arising from the left and right ribs, left and right chest wall, right clavicle and right inferior pubic ischium. Largest is solid mass in the anterior left chest wall measuring 10.2 x 9.9 cm.  --Recommended to initiate systemic chemotherapy due to acute renal dysfunction.  --Received 7 cycles of CyBorD from 01/09/2021 to 06/26/2021 --Starting 07/29/2021, started maintenance therapy revlimid 5 mg PO daily 21 days on 7 days off.  Plan: --Most recent M protein was undetectable on 04/01/2022. Lambda light chain 16.7, Kappa 22.8, ratio 1.37 --Labs today were reviewed and showed WBC 1.9, Hgb 12.7, MCV 87.4, Plt 126 --continue Revlimid dose 2.5 mg   --RTC in 4 weeks with labs  #Neutropenia/Anemia: --Secondary to revlimid pills/poor kidney function  --Hgb 12.7 and ANC 1.0 today -- Decreased dose of Revlimid to try to improve this.  #Renal dysfunction: --Secondary to damage from malignancy.  --Creatinine is 2.09, worsening over last 3 visits.  --will let Dr. Thedore Mins in nephrology know. --Continue to monitor closely.   #Low back pain with neuropathic pain to bilateral legs: --Back pain has not improved since completion of palliative radiation. --Has oxycodone 5 mg PRN but hesitant to take due to existing  constipation --Currently on gabapentin 300 mg TID with plans to increase 600 mg TID.  --Under the care of Dr. Barbaraann Cao in neurology.   #Hypotension-resolved: --Today's BP was 130/76 --Patient reports dizzy spells and near syncopal episodes. --Currently on Norvasc 10 mg daily, prescribed by nephrologist.  --Recommend to hold Norvasc, check BP daily at home,and f/u with nephrologist (Dr. Anthony Sar) if she needs to resume BP medication  #Supportive Care -- port placement not required -- zofran 8mg  q8H PRN and compazine 10mg  PO q6H for nausea -- acyclovir 400mg  PO BID for VCZ prophylaxis -- stool softeners/laxatives for constipation as needed  No orders of the defined types were placed in this encounter.  All questions were answered. The patient knows to call the clinic with any problems, questions or concerns.  I have spent a total of 30 minutes minutes of face-to-face and non-face-to-face time, preparing to see the patient, performing a medically appropriate examination, counseling and educating the patient, ordering medications, documenting clinical information in the electronic health record,  and care coordination.   Ulysees Barns, MD Department of Hematology/Oncology [facility] Phone: [phone] Pager: [phone] Email: [email or web address]

## 2022-04-29 ENCOUNTER — Inpatient Hospital Stay (HOSPITAL_BASED_OUTPATIENT_CLINIC_OR_DEPARTMENT_OTHER): Payer: 59 | Admitting: Hematology and Oncology

## 2022-04-29 ENCOUNTER — Inpatient Hospital Stay: Payer: 59 | Attending: Hematology and Oncology

## 2022-04-29 ENCOUNTER — Telehealth: Payer: Self-pay | Admitting: Hematology and Oncology

## 2022-04-29 VITALS — BP 130/76 | HR 63 | Temp 97.5°F | Resp 13 | Wt 175.5 lb

## 2022-04-29 DIAGNOSIS — Z808 Family history of malignant neoplasm of other organs or systems: Secondary | ICD-10-CM | POA: Diagnosis not present

## 2022-04-29 DIAGNOSIS — C9030 Solitary plasmacytoma not having achieved remission: Secondary | ICD-10-CM | POA: Diagnosis not present

## 2022-04-29 DIAGNOSIS — C9001 Multiple myeloma in remission: Secondary | ICD-10-CM | POA: Diagnosis not present

## 2022-04-29 DIAGNOSIS — Z780 Asymptomatic menopausal state: Secondary | ICD-10-CM | POA: Diagnosis not present

## 2022-04-29 LAB — CBC WITH DIFFERENTIAL (CANCER CENTER ONLY)
Abs Immature Granulocytes: 0 10*3/uL (ref 0.00–0.07)
Basophils Absolute: 0.1 10*3/uL (ref 0.0–0.1)
Basophils Relative: 3 %
Eosinophils Absolute: 0.2 10*3/uL (ref 0.0–0.5)
Eosinophils Relative: 8 %
HCT: 36.6 % (ref 36.0–46.0)
Hemoglobin: 12.7 g/dL (ref 12.0–15.0)
Immature Granulocytes: 0 %
Lymphocytes Relative: 28 %
Lymphs Abs: 0.5 10*3/uL — ABNORMAL LOW (ref 0.7–4.0)
MCH: 30.3 pg (ref 26.0–34.0)
MCHC: 34.7 g/dL (ref 30.0–36.0)
MCV: 87.4 fL (ref 80.0–100.0)
Monocytes Absolute: 0.2 10*3/uL (ref 0.1–1.0)
Monocytes Relative: 11 %
Neutro Abs: 1 10*3/uL — ABNORMAL LOW (ref 1.7–7.7)
Neutrophils Relative %: 50 %
Platelet Count: 126 10*3/uL — ABNORMAL LOW (ref 150–400)
RBC: 4.19 MIL/uL (ref 3.87–5.11)
RDW: 14.4 % (ref 11.5–15.5)
WBC Count: 1.9 10*3/uL — ABNORMAL LOW (ref 4.0–10.5)
nRBC: 0 % (ref 0.0–0.2)

## 2022-04-29 LAB — CMP (CANCER CENTER ONLY)
ALT: 12 U/L (ref 0–44)
AST: 14 U/L — ABNORMAL LOW (ref 15–41)
Albumin: 4.4 g/dL (ref 3.5–5.0)
Alkaline Phosphatase: 75 U/L (ref 38–126)
Anion gap: 8 (ref 5–15)
BUN: 26 mg/dL — ABNORMAL HIGH (ref 8–23)
CO2: 25 mmol/L (ref 22–32)
Calcium: 9.5 mg/dL (ref 8.9–10.3)
Chloride: 107 mmol/L (ref 98–111)
Creatinine: 2.09 mg/dL — ABNORMAL HIGH (ref 0.44–1.00)
GFR, Estimated: 25 mL/min — ABNORMAL LOW (ref >60)
Glucose, Bld: 100 mg/dL — ABNORMAL HIGH (ref 70–99)
Potassium: 4.3 mmol/L (ref 3.5–5.1)
Sodium: 140 mmol/L (ref 135–145)
Total Bilirubin: 0.5 mg/dL (ref 0.3–1.2)
Total Protein: 7 g/dL (ref 6.5–8.1)

## 2022-04-29 LAB — LACTATE DEHYDROGENASE: LDH: 125 U/L (ref 98–192)

## 2022-04-29 NOTE — Telephone Encounter (Signed)
Reached out to patient to schedule per 4/23 LOS, patient aware of dates and times of appointments.

## 2022-04-30 LAB — KAPPA/LAMBDA LIGHT CHAINS
Kappa free light chain: 21.7 mg/L — ABNORMAL HIGH (ref 3.3–19.4)
Kappa, lambda light chain ratio: 1.39 (ref 0.26–1.65)
Lambda free light chains: 15.6 mg/L (ref 5.7–26.3)

## 2022-04-30 LAB — BETA 2 MICROGLOBULIN, SERUM: Beta-2 Microglobulin: 3.2 mg/L — ABNORMAL HIGH (ref 0.6–2.4)

## 2022-05-04 LAB — MULTIPLE MYELOMA PANEL, SERUM
Albumin SerPl Elph-Mcnc: 4.1 g/dL (ref 2.9–4.4)
Albumin/Glob SerPl: 1.8 — ABNORMAL HIGH (ref 0.7–1.7)
Alpha 1: 0.2 g/dL (ref 0.0–0.4)
Alpha2 Glob SerPl Elph-Mcnc: 0.7 g/dL (ref 0.4–1.0)
B-Globulin SerPl Elph-Mcnc: 0.9 g/dL (ref 0.7–1.3)
Gamma Glob SerPl Elph-Mcnc: 0.7 g/dL (ref 0.4–1.8)
Globulin, Total: 2.4 g/dL (ref 2.2–3.9)
IgA: 34 mg/dL — ABNORMAL LOW (ref 87–352)
IgG (Immunoglobin G), Serum: 769 mg/dL (ref 586–1602)
IgM (Immunoglobulin M), Srm: 23 mg/dL — ABNORMAL LOW (ref 26–217)
Total Protein ELP: 6.5 g/dL (ref 6.0–8.5)

## 2022-05-05 ENCOUNTER — Telehealth: Payer: Self-pay | Admitting: Physician Assistant

## 2022-05-05 ENCOUNTER — Other Ambulatory Visit: Payer: Self-pay

## 2022-05-05 ENCOUNTER — Other Ambulatory Visit: Payer: 59

## 2022-05-05 ENCOUNTER — Other Ambulatory Visit: Payer: Self-pay | Admitting: Physician Assistant

## 2022-05-05 ENCOUNTER — Telehealth: Payer: Self-pay | Admitting: *Deleted

## 2022-05-05 DIAGNOSIS — R7989 Other specified abnormal findings of blood chemistry: Secondary | ICD-10-CM

## 2022-05-05 NOTE — Telephone Encounter (Signed)
Reached out to patient about appointment added for tomorrow per Irene[secure chat] left voicemail.

## 2022-05-05 NOTE — Telephone Encounter (Signed)
TCT patient regarding a lab appt and a renal U/S requested by her Nephrologist.  Pt is agreeable. She will come in tomorrow for labs. Renal U/S ordered per Georga Kaufmann, PA

## 2022-05-06 ENCOUNTER — Other Ambulatory Visit: Payer: Self-pay

## 2022-05-06 ENCOUNTER — Inpatient Hospital Stay: Payer: 59

## 2022-05-06 DIAGNOSIS — C9030 Solitary plasmacytoma not having achieved remission: Secondary | ICD-10-CM | POA: Diagnosis not present

## 2022-05-06 DIAGNOSIS — R7989 Other specified abnormal findings of blood chemistry: Secondary | ICD-10-CM

## 2022-05-06 LAB — URINALYSIS, COMPLETE (UACMP) WITH MICROSCOPIC
Bilirubin Urine: NEGATIVE
Glucose, UA: NEGATIVE mg/dL
Ketones, ur: NEGATIVE mg/dL
Leukocytes,Ua: NEGATIVE
Nitrite: NEGATIVE
Protein, ur: NEGATIVE mg/dL
Specific Gravity, Urine: 1.015 (ref 1.005–1.030)
pH: 5 (ref 5.0–8.0)

## 2022-05-06 LAB — PROTEIN / CREATININE RATIO, URINE
Creatinine, Urine: 122 mg/dL
Protein Creatinine Ratio: 0.16 mg/mg{Cre} — ABNORMAL HIGH (ref 0.00–0.15)
Total Protein, Urine: 20 mg/dL

## 2022-05-07 LAB — MICROALBUMIN / CREATININE URINE RATIO
Creatinine, Urine: 108.9 mg/dL
Microalb Creat Ratio: 14 mg/g creat (ref 0–29)
Microalb, Ur: 15.4 ug/mL — ABNORMAL HIGH

## 2022-05-12 ENCOUNTER — Ambulatory Visit (HOSPITAL_COMMUNITY)
Admission: RE | Admit: 2022-05-12 | Discharge: 2022-05-12 | Disposition: A | Discharge status: Home / Self Care | Payer: 59 | Source: Ambulatory Visit | Attending: Physician Assistant | Admitting: Physician Assistant

## 2022-05-12 DIAGNOSIS — R7989 Other specified abnormal findings of blood chemistry: Secondary | ICD-10-CM | POA: Diagnosis not present

## 2022-05-19 ENCOUNTER — Other Ambulatory Visit: Payer: Self-pay | Admitting: Hematology and Oncology

## 2022-05-19 DIAGNOSIS — C9000 Multiple myeloma not having achieved remission: Secondary | ICD-10-CM

## 2022-05-27 ENCOUNTER — Inpatient Hospital Stay: Payer: 59

## 2022-06-02 ENCOUNTER — Other Ambulatory Visit: Payer: Self-pay | Admitting: Adult Health

## 2022-06-02 DIAGNOSIS — F32A Depression, unspecified: Secondary | ICD-10-CM

## 2022-06-16 ENCOUNTER — Other Ambulatory Visit: Payer: Self-pay | Admitting: Adult Health

## 2022-06-16 DIAGNOSIS — E039 Hypothyroidism, unspecified: Secondary | ICD-10-CM

## 2022-06-16 DIAGNOSIS — C9030 Solitary plasmacytoma not having achieved remission: Secondary | ICD-10-CM

## 2022-06-18 ENCOUNTER — Other Ambulatory Visit: Payer: Self-pay | Admitting: Hematology and Oncology

## 2022-06-18 DIAGNOSIS — C9000 Multiple myeloma not having achieved remission: Secondary | ICD-10-CM

## 2022-06-24 ENCOUNTER — Inpatient Hospital Stay: Payer: 59 | Attending: Hematology and Oncology

## 2022-06-24 ENCOUNTER — Other Ambulatory Visit: Payer: Self-pay

## 2022-06-24 DIAGNOSIS — C9030 Solitary plasmacytoma not having achieved remission: Secondary | ICD-10-CM | POA: Insufficient documentation

## 2022-06-24 DIAGNOSIS — C9001 Multiple myeloma in remission: Secondary | ICD-10-CM

## 2022-06-24 LAB — CMP (CANCER CENTER ONLY)
ALT: 10 U/L (ref 0–44)
AST: 13 U/L — ABNORMAL LOW (ref 15–41)
Albumin: 4 g/dL (ref 3.5–5.0)
Alkaline Phosphatase: 76 U/L (ref 38–126)
Anion gap: 8 (ref 5–15)
BUN: 22 mg/dL (ref 8–23)
CO2: 21 mmol/L — ABNORMAL LOW (ref 22–32)
Calcium: 9 mg/dL (ref 8.9–10.3)
Chloride: 110 mmol/L (ref 98–111)
Creatinine: 1.83 mg/dL — ABNORMAL HIGH (ref 0.44–1.00)
GFR, Estimated: 30 mL/min — ABNORMAL LOW (ref >60)
Glucose, Bld: 108 mg/dL — ABNORMAL HIGH (ref 70–99)
Potassium: 4.3 mmol/L (ref 3.5–5.1)
Sodium: 139 mmol/L (ref 135–145)
Total Bilirubin: 0.5 mg/dL (ref 0.3–1.2)
Total Protein: 6.2 g/dL — ABNORMAL LOW (ref 6.5–8.1)

## 2022-06-24 LAB — CBC WITH DIFFERENTIAL (CANCER CENTER ONLY)
Abs Immature Granulocytes: 0 10*3/uL (ref 0.00–0.07)
Basophils Absolute: 0.1 10*3/uL (ref 0.0–0.1)
Basophils Relative: 3 %
Eosinophils Absolute: 0.1 10*3/uL (ref 0.0–0.5)
Eosinophils Relative: 6 %
HCT: 36.2 % (ref 36.0–46.0)
Hemoglobin: 12.5 g/dL (ref 12.0–15.0)
Immature Granulocytes: 0 %
Lymphocytes Relative: 28 %
Lymphs Abs: 0.6 10*3/uL — ABNORMAL LOW (ref 0.7–4.0)
MCH: 30.3 pg (ref 26.0–34.0)
MCHC: 34.5 g/dL (ref 30.0–36.0)
MCV: 87.9 fL (ref 80.0–100.0)
Monocytes Absolute: 0.2 10*3/uL (ref 0.1–1.0)
Monocytes Relative: 10 %
Neutro Abs: 1.1 10*3/uL — ABNORMAL LOW (ref 1.7–7.7)
Neutrophils Relative %: 53 %
Platelet Count: 134 10*3/uL — ABNORMAL LOW (ref 150–400)
RBC: 4.12 MIL/uL (ref 3.87–5.11)
RDW: 14 % (ref 11.5–15.5)
WBC Count: 2 10*3/uL — ABNORMAL LOW (ref 4.0–10.5)
nRBC: 0 % (ref 0.0–0.2)

## 2022-06-24 LAB — LACTATE DEHYDROGENASE: LDH: 151 U/L (ref 98–192)

## 2022-06-25 LAB — BETA 2 MICROGLOBULIN, SERUM: Beta-2 Microglobulin: 2.9 mg/L — ABNORMAL HIGH (ref 0.6–2.4)

## 2022-06-25 LAB — KAPPA/LAMBDA LIGHT CHAINS
Kappa free light chain: 17.3 mg/L (ref 3.3–19.4)
Kappa, lambda light chain ratio: 1.13 (ref 0.26–1.65)
Lambda free light chains: 15.3 mg/L (ref 5.7–26.3)

## 2022-06-28 LAB — MULTIPLE MYELOMA PANEL, SERUM
Albumin SerPl Elph-Mcnc: 4 g/dL (ref 2.9–4.4)
Albumin/Glob SerPl: 1.8 — ABNORMAL HIGH (ref 0.7–1.7)
Alpha 1: 0.2 g/dL (ref 0.0–0.4)
Alpha2 Glob SerPl Elph-Mcnc: 0.6 g/dL (ref 0.4–1.0)
B-Globulin SerPl Elph-Mcnc: 0.8 g/dL (ref 0.7–1.3)
Gamma Glob SerPl Elph-Mcnc: 0.7 g/dL (ref 0.4–1.8)
Globulin, Total: 2.3 g/dL (ref 2.2–3.9)
IgA: 30 mg/dL — ABNORMAL LOW (ref 87–352)
IgG (Immunoglobin G), Serum: 799 mg/dL (ref 586–1602)
IgM (Immunoglobulin M), Srm: 24 mg/dL — ABNORMAL LOW (ref 26–217)
Total Protein ELP: 6.3 g/dL (ref 6.0–8.5)

## 2022-07-07 ENCOUNTER — Other Ambulatory Visit (HOSPITAL_COMMUNITY): Payer: Self-pay

## 2022-07-16 ENCOUNTER — Other Ambulatory Visit: Payer: Self-pay | Admitting: Hematology and Oncology

## 2022-07-16 DIAGNOSIS — C9000 Multiple myeloma not having achieved remission: Secondary | ICD-10-CM

## 2022-07-17 ENCOUNTER — Other Ambulatory Visit: Payer: Self-pay | Admitting: *Deleted

## 2022-07-17 DIAGNOSIS — C9000 Multiple myeloma not having achieved remission: Secondary | ICD-10-CM

## 2022-07-17 LAB — LAB REPORT - SCANNED
Albumin, Urine POC: 4.5
Albumin/Creatinine Ratio, Urine, POC: 8

## 2022-07-17 MED ORDER — LENALIDOMIDE 2.5 MG PO CAPS
2.5000 mg | ORAL_CAPSULE | Freq: Every day | ORAL | 0 refills | Status: DC
Start: 2022-07-17 — End: 2022-08-08

## 2022-07-23 ENCOUNTER — Other Ambulatory Visit: Payer: Self-pay

## 2022-07-23 ENCOUNTER — Inpatient Hospital Stay: Payer: 59 | Attending: Hematology and Oncology

## 2022-07-23 ENCOUNTER — Inpatient Hospital Stay: Payer: 59 | Admitting: Hematology and Oncology

## 2022-07-23 VITALS — BP 129/77 | HR 64 | Temp 97.4°F | Resp 13 | Wt 176.9 lb

## 2022-07-23 DIAGNOSIS — M5459 Other low back pain: Secondary | ICD-10-CM | POA: Diagnosis not present

## 2022-07-23 DIAGNOSIS — D696 Thrombocytopenia, unspecified: Secondary | ICD-10-CM | POA: Diagnosis not present

## 2022-07-23 DIAGNOSIS — Z79899 Other long term (current) drug therapy: Secondary | ICD-10-CM | POA: Insufficient documentation

## 2022-07-23 DIAGNOSIS — G620 Drug-induced polyneuropathy: Secondary | ICD-10-CM | POA: Diagnosis not present

## 2022-07-23 DIAGNOSIS — M792 Neuralgia and neuritis, unspecified: Secondary | ICD-10-CM | POA: Insufficient documentation

## 2022-07-23 DIAGNOSIS — C9001 Multiple myeloma in remission: Secondary | ICD-10-CM

## 2022-07-23 DIAGNOSIS — N289 Disorder of kidney and ureter, unspecified: Secondary | ICD-10-CM | POA: Insufficient documentation

## 2022-07-23 DIAGNOSIS — R7989 Other specified abnormal findings of blood chemistry: Secondary | ICD-10-CM

## 2022-07-23 DIAGNOSIS — C9030 Solitary plasmacytoma not having achieved remission: Secondary | ICD-10-CM | POA: Diagnosis not present

## 2022-07-23 DIAGNOSIS — Z808 Family history of malignant neoplasm of other organs or systems: Secondary | ICD-10-CM | POA: Insufficient documentation

## 2022-07-23 DIAGNOSIS — T451X5A Adverse effect of antineoplastic and immunosuppressive drugs, initial encounter: Secondary | ICD-10-CM

## 2022-07-23 LAB — CMP (CANCER CENTER ONLY)
ALT: 10 U/L (ref 0–44)
AST: 13 U/L — ABNORMAL LOW (ref 15–41)
Albumin: 4.2 g/dL (ref 3.5–5.0)
Alkaline Phosphatase: 76 U/L (ref 38–126)
Anion gap: 7 (ref 5–15)
BUN: 26 mg/dL — ABNORMAL HIGH (ref 8–23)
CO2: 22 mmol/L (ref 22–32)
Calcium: 9.1 mg/dL (ref 8.9–10.3)
Chloride: 111 mmol/L (ref 98–111)
Creatinine: 1.87 mg/dL — ABNORMAL HIGH (ref 0.44–1.00)
GFR, Estimated: 29 mL/min — ABNORMAL LOW (ref >60)
Glucose, Bld: 93 mg/dL (ref 70–99)
Potassium: 4.2 mmol/L (ref 3.5–5.1)
Sodium: 140 mmol/L (ref 135–145)
Total Bilirubin: 0.4 mg/dL (ref 0.3–1.2)
Total Protein: 6.6 g/dL (ref 6.5–8.1)

## 2022-07-23 LAB — CBC WITH DIFFERENTIAL (CANCER CENTER ONLY)
Abs Immature Granulocytes: 0.01 10*3/uL (ref 0.00–0.07)
Basophils Absolute: 0 10*3/uL (ref 0.0–0.1)
Basophils Relative: 2 %
Eosinophils Absolute: 0.2 10*3/uL (ref 0.0–0.5)
Eosinophils Relative: 8 %
HCT: 36.1 % (ref 36.0–46.0)
Hemoglobin: 12.3 g/dL (ref 12.0–15.0)
Immature Granulocytes: 0 %
Lymphocytes Relative: 34 %
Lymphs Abs: 0.8 10*3/uL (ref 0.7–4.0)
MCH: 30 pg (ref 26.0–34.0)
MCHC: 34.1 g/dL (ref 30.0–36.0)
MCV: 88 fL (ref 80.0–100.0)
Monocytes Absolute: 0.3 10*3/uL (ref 0.1–1.0)
Monocytes Relative: 12 %
Neutro Abs: 1 10*3/uL — ABNORMAL LOW (ref 1.7–7.7)
Neutrophils Relative %: 44 %
Platelet Count: 131 10*3/uL — ABNORMAL LOW (ref 150–400)
RBC: 4.1 MIL/uL (ref 3.87–5.11)
RDW: 14.1 % (ref 11.5–15.5)
WBC Count: 2.3 10*3/uL — ABNORMAL LOW (ref 4.0–10.5)
nRBC: 0 % (ref 0.0–0.2)

## 2022-07-23 LAB — LACTATE DEHYDROGENASE: LDH: 126 U/L (ref 98–192)

## 2022-07-23 NOTE — Progress Notes (Signed)
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  Serological testing concerning for a plasma cell neoplasm, with UPEP showing 7.4 g of protein daily with markedly high lambda light chains -11/07/2020: Bone marrow biopsy performed, shows no evidence of multiple myeloma -11/23/2020: Biopsy of right clavicle mass confirmed plasmacytoma.  -11/27/2020: PET scan show intensely hypermetabolic large soft tissue masses arising from the LEFT and RIGHT ribs. Two large lesions in the LEFT chest wall and a single large lesion in the RIGHT chest wall.Hypermetabolic expansile solid masses arising from the medial RIGHT clavicle and RIGHT inferior pubic ischium 01/09/2021: Cycle 1, Day 1 CyBorD 02/06/2021: Cycle 2, Day 1 CyBorD 03/06/2021: Cycle 3, Day 1 CyBorD 04/03/2021: Cycle 4, Day 1 CyBorD  05/01/2021: Cycle 5, Day 1 CyBorD  05/29/2021: Cycle 6, Day 1 CyBorD  06/26/2021: Cycle 7, Day 1 CyBorD  07/29/2021: Started maintenance Revlimid 5 mg once daily, 21 days on, 7 days off every 28 days.   Interval History:  Jordan Gregory 67 y.o. 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 The patient was last seen on 04/29/2022.  In the interim, she has continued maintenance Revlimid therapy.   On exam today Jordan Gregory reports she has been "feeling pretty good" in the interim since her last visit.  Her energy is better but "not perfect".   She does that she is able to do everything she needs to do around the home.  She reports that tasks that were previously arduous like changing the sheets or doing laundry are now less difficult.  She continues taking her Revlimid pills 2.5 mg every other day.  She also continues to follow with nephrology.  She has had no recent infectious symptoms such as runny nose, sore throat, or cough.  She denies fevers, chills, night sweats, shortness of breath, chest pain or cough.  She has no other complaints.  Rest of the 10 point ROS is below.  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HCC)    Thyroid disease    Vitamin D deficiency     SURGICAL HISTORY: Past Surgical History:  Procedure Laterality Date   MOHS SURGERY  2016    SOCIAL HISTORY: Social History   Socioeconomic History   Marital status: Married    Spouse name: Not on file   Number of children: Not on file   Years of education: Not on file   Highest education level: Not on file  Occupational History   Not on file  Tobacco Use   Smoking status: Never   Smokeless tobacco: Never  Vaping Use   Vaping status: Never Used  Substance and Sexual Activity   Alcohol use: Yes    Alcohol/week: 5.0 standard drinks of alcohol    Types: 5 Standard drinks or equivalent per week    Comment: regular   Drug use: No   Sexual activity: Yes    Birth control/protection: Post-menopausal    Comment: 1st intercourse  18 yo-5 partners  Other Topics Concern   Not on file  Social History Narrative   She works at [employer] and [employer].    Married - 23 years    No kids       She likes to travel, read, cycle, be outside.    Social Determinants of Health   Financial Resource Strain: Not on file  Food Insecurity: Not on file  Transportation Needs: No Transportation Needs (10/18/2020)   PRAPARE - Administrator, Civil Service (Medical): No    Lack of Transportation  (Non-Medical): No  Physical Activity: Not on file  Stress: Not on file  Social Connections: Not on file  Intimate Partner Violence: Not At Risk (12/04/2020)   Humiliation, Afraid, Rape, and Kick questionnaire    Fear of Current or Ex-Partner: No    Emotionally Abused: No    Physically Abused: No    Sexually Abused: No    FAMILY HISTORY: Family History  Problem Relation Age of Onset   Arthritis Mother    Hypertension Mother    Dementia Mother    Heart disease Father    Liver cancer Father    Asperger's syndrome Brother    Atrial fibrillation Brother     ALLERGIES:  is allergic to ramipril.  MEDICATIONS:  Current Outpatient Medications  Medication Sig Dispense Refill   amLODipine (NORVASC) 10 MG tablet Take 10 mg by mouth daily. (Patient not taking: Reported on 12/19/2021)     aspirin EC 81 MG tablet Take 81 mg by mouth daily. Swallow whole.     calcium carbonate (OS-CAL) 600 MG TABS Take 600 mg by mouth daily.     citalopram (CELEXA) 10 MG tablet TAKE 1 TABLET BY MOUTH EVERY DAY 90 tablet 0   gabapentin (NEURONTIN) 300 MG capsule TAKE 1 CAPSULE BY MOUTH THREE TIMES A DAY (Patient not taking: Reported on 04/29/2022) 90 capsule 1   lenalidomide (REVLIMID) 2.5 MG capsule Take 1 capsule (2.5 mg total) by mouth daily. [employer] Auth # [id number]     Date Obtained 07/17/22 Take for 21 days and then 7 days off 21 capsule 0   levothyroxine (SYNTHROID) 75 MCG tablet TAKE 1 TABLET BY MOUTH EVERY DAY BEFORE BREAKFAST 90 tablet 3   ondansetron (ZOFRAN) 8 MG tablet Take 1 tablet (8 mg total) by mouth every 8 (eight) hours as needed for nausea or vomiting. (Patient not taking: Reported on 12/19/2021) 60 tablet 2   prochlorperazine (COMPAZINE) 10 MG tablet Take 1 tablet (10 mg total) by mouth every 6 (six) hours as needed for nausea or vomiting. (Patient not taking: Reported on 12/19/2021) 60 tablet 2   traZODone (DESYREL) 50 MG tablet TAKE 1/2 TO 1 TABLET BY MOUTH AT BEDTIME AS NEEDED FOR SLEEP 90  tablet 1   VITAMIN D, CHOLECALCIFEROL, PO Take 2,000 Int'l Units by mouth daily.     No current facility-administered medications for this visit.    REVIEW OF SYSTEMS:   Constitutional: ( - ) fevers, ( - )  chills , ( - ) night sweats Eyes: ( - ) blurriness of vision, ( - ) double vision, ( - ) watery eyes Ears, nose, mouth, throat, and face: ( - ) mucositis, ( - ) sore throat Respiratory: ( - ) cough, ( - ) dyspnea, ( - ) wheezes Cardiovascular: ( - ) palpitation, ( - ) chest discomfort, ( - ) lower extremity swelling Gastrointestinal:  ( - ) nausea, ( - ) heartburn, ( + )  change in bowel habits Skin: ( - ) abnormal skin rashes Lymphatics: ( - ) new lymphadenopathy, ( - ) easy bruising Neurological: ( + ) numbness, ( - ) tingling, ( - ) new weaknesses Behavioral/Psych: ( - ) mood change, ( - ) new changes  All other systems were reviewed with the patient and are negative.  PHYSICAL EXAMINATION: ECOG PERFORMANCE STATUS: 1 - Symptomatic but completely ambulatory  Vitals:   07/23/22 1037  BP: 129/77  Pulse: 64  Resp: 13  Temp: (!) 97.4 F (36.3 C)  SpO2: 100%     Filed Weights   07/23/22 1037  Weight: 176 lb 14.4 oz (80.2 kg)    GENERAL: Well-appearing middle-age Caucasian female, alert, no distress and comfortable SKIN: skin color, texture, turgor are normal, no rashes or significant lesions EYES: conjunctiva are pink and non-injected, sclera clear CHEST: mass over right clavicle, firm but decreasing in size  LUNGS: clear to auscultation and percussion with normal breathing effort HEART: regular rate & rhythm and no murmurs and no lower extremity edema Musculoskeletal: no cyanosis of digits and no clubbing  PSYCH: alert & oriented x 3, fluent speech NEURO: no focal motor/sensory deficits  LABORATORY DATA:  I have reviewed the data as listed    Latest Ref Rng & Units 07/23/2022    9:45 AM 06/24/2022   10:08 AM 04/29/2022    9:58 AM  CBC  WBC 4.0 - 10.5 K/uL 2.3  2.0   1.9   Hemoglobin 12.0 - 15.0 g/dL 16.1  09.6  04.5   Hematocrit 36.0 - 46.0 % 36.1  36.2  36.6   Platelets 150 - 400 K/uL 131  134  126        Latest Ref Rng & Units 07/23/2022    9:45 AM 06/24/2022   10:08 AM 04/29/2022    9:58 AM  CMP  Glucose 70 - 99 mg/dL 93  409  811   BUN 8 - 23 mg/dL 26  22  26    Creatinine 0.44 - 1.00 mg/dL 9.14  7.82  9.56   Sodium 135 - 145 mmol/L 140  139  140   Potassium 3.5 - 5.1 mmol/L 4.2  4.3  4.3   Chloride 98 - 111 mmol/L 111  110  107   CO2 22 - 32 mmol/L 22  21  25    Calcium 8.9 - 10.3 mg/dL 9.1  9.0  9.5   Total Protein 6.5 - 8.1 g/dL 6.6  6.2  7.0   Total Bilirubin 0.3 - 1.2 mg/dL 0.4  0.5  0.5   Alkaline Phos 38 - 126 U/L 76  76  75   AST 15 - 41 U/L 13  13  14    ALT 0 - 44 U/L 10  10  12      Lab Results  Component Value Date   MPROTEIN Not Observed 06/24/2022   MPROTEIN Not Observed 04/29/2022   MPROTEIN Not Observed 04/01/2022   Lab Results  Component Value Date   KPAFRELGTCHN 15.6 07/23/2022   KPAFRELGTCHN 17.3 06/24/2022   KPAFRELGTCHN 21.7 (H) 04/29/2022   LAMBDASER 12.5 07/23/2022   LAMBDASER 15.3 06/24/2022   LAMBDASER 15.6 04/29/2022   KAPLAMBRATIO 1.25 07/23/2022   KAPLAMBRATIO 1.13 06/24/2022   KAPLAMBRATIO 1.39 04/29/2022     RADIOGRAPHIC STUDIES: No results found.  ASSESSMENT & PLAN Jordan Gregory 67 y.o. 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  Previously we discussed the  diagnosis and treatment options for plasmacytomas without multiple myeloma. This includes radiation therapy verus chemotherapy. Dr. Leonides Schanz discussed case with Dr. Alvino Chapel from [facility] and the recommendation was to proceed with chemotherapy due to patients decline in renal function.  Given the patient's kidney dysfunction at this time the treatment of choice was CyBorD chemotherapy.  Patient received 7 cycles of CyBorD from 01/09/2021-06/26/2021.   Jordan Gregory  transitioned to maintenance Revlimid 5 mg PO daily starting 07/29/2021.   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   --Serological studies are confirmatory of a plasma cell neoplasm with markedly high lambda light chains and proteinuria.  --Bone marrow biopsy from 11/07/2020 showed no evidence of multiple myeloma. Right clavicle biopsy from 11/23/2020 confirmed plasma cell neoplasm consistent with plasmacytomas.  --PET scan from 11/27/2020 showed multifocal hypermetabolic soft tissue massing arising from the left and right ribs, left and right chest wall, right clavicle and right inferior pubic ischium. Largest is solid mass in the anterior left chest wall measuring 10.2 x 9.9 cm.  --Recommended to initiate systemic chemotherapy due to acute renal dysfunction.  --Received 7 cycles of CyBorD from 01/09/2021 to 06/26/2021 --Starting 07/29/2021, started maintenance therapy revlimid 5 mg PO daily 21 days on 7 days off.  Plan: --Most recent M protein was undetectable on 04/01/2022. Lambda light chain 15.3, Kappa 17.3, ratio 1.13 --Labs today were reviewed and showed WBC 2.3, Hgb 12.3, MCV 88, Plt 131 --continue Revlimid dose 2.5 mg   --RTC in 4 weeks with labs  #Neutropenia/Anemia: --Secondary to revlimid pills/poor kidney function  -- ANC 1.0 today, Hgb 12.3 -- Decreased dose of Revlimid to try to improve this.  #Renal dysfunction: --Secondary to damage from malignancy.  --Creatinine is 1.87 --Continue to monitor closely.   #Low back pain with neuropathic pain to bilateral legs: --Back pain has not improved since completion of palliative radiation. --Has oxycodone 5 mg PRN but hesitant to take due to existing constipation --Currently on gabapentin 300 mg TID with plans to increase 600 mg TID.  --Under the care of Dr. Barbaraann Cao in neurology.   #Hypotension-resolved: --Today's BP was 129/77 --Patient reported dizzy  spells and near syncopal episodes. --Currently on Norvasc 10 mg daily, prescribed by nephrologist.  --Recommend to hold Norvasc, check BP daily at home,and f/u with nephrologist (Dr. Anthony Sar) if she needs to resume BP medication  #Supportive Care -- port placement not required -- zofran 8mg  q8H PRN and compazine 10mg  PO q6H for nausea -- acyclovir 400mg  PO BID for VCZ prophylaxis -- stool softeners/laxatives for constipation as needed  No orders of the defined types were placed in this encounter.  All questions were answered. The patient knows to call the clinic with any problems, questions or concerns.  I have spent a total of 30 minutes minutes of face-to-face and non-face-to-face time, preparing to see the patient, performing a medically appropriate examination, counseling and educating the patient, ordering medications, documenting clinical information in the electronic health record,  and care coordination.   Ulysees Barns, MD Department of Hematology/Oncology [facility] Phone: [phone] Pager: [phone] Email: [email or web address]

## 2022-07-24 ENCOUNTER — Telehealth: Payer: Self-pay | Admitting: Hematology and Oncology

## 2022-07-24 LAB — KAPPA/LAMBDA LIGHT CHAINS
Kappa free light chain: 15.6 mg/L (ref 3.3–19.4)
Kappa, lambda light chain ratio: 1.25 (ref 0.26–1.65)
Lambda free light chains: 12.5 mg/L (ref 5.7–26.3)

## 2022-07-25 LAB — BETA 2 MICROGLOBULIN, SERUM: Beta-2 Microglobulin: 2.3 mg/L (ref 0.6–2.4)

## 2022-07-26 ENCOUNTER — Encounter: Payer: Self-pay | Admitting: Hematology and Oncology

## 2022-07-28 LAB — MULTIPLE MYELOMA PANEL, SERUM
Albumin SerPl Elph-Mcnc: 3.5 g/dL (ref 2.9–4.4)
Albumin/Glob SerPl: 1.5 (ref 0.7–1.7)
Alpha 1: 0.2 g/dL (ref 0.0–0.4)
Alpha2 Glob SerPl Elph-Mcnc: 0.6 g/dL (ref 0.4–1.0)
B-Globulin SerPl Elph-Mcnc: 0.8 g/dL (ref 0.7–1.3)
Gamma Glob SerPl Elph-Mcnc: 0.7 g/dL (ref 0.4–1.8)
Globulin, Total: 2.4 g/dL (ref 2.2–3.9)
IgA: 32 mg/dL — ABNORMAL LOW (ref 87–352)
IgG (Immunoglobin G), Serum: 783 mg/dL (ref 586–1602)
IgM (Immunoglobulin M), Srm: 29 mg/dL (ref 26–217)
Total Protein ELP: 5.9 g/dL — ABNORMAL LOW (ref 6.0–8.5)

## 2022-08-08 ENCOUNTER — Other Ambulatory Visit: Payer: Self-pay | Admitting: *Deleted

## 2022-08-08 ENCOUNTER — Other Ambulatory Visit: Payer: Self-pay | Admitting: Hematology and Oncology

## 2022-08-08 DIAGNOSIS — C9000 Multiple myeloma not having achieved remission: Secondary | ICD-10-CM

## 2022-08-08 MED ORDER — LENALIDOMIDE 2.5 MG PO CAPS
2.5000 mg | ORAL_CAPSULE | Freq: Every day | ORAL | 0 refills | Status: DC
Start: 2022-08-08 — End: 2022-09-11

## 2022-08-20 ENCOUNTER — Other Ambulatory Visit: Payer: Self-pay

## 2022-08-20 ENCOUNTER — Inpatient Hospital Stay: Payer: 59 | Attending: Hematology and Oncology

## 2022-08-20 DIAGNOSIS — C9030 Solitary plasmacytoma not having achieved remission: Secondary | ICD-10-CM | POA: Insufficient documentation

## 2022-08-20 DIAGNOSIS — C9001 Multiple myeloma in remission: Secondary | ICD-10-CM

## 2022-08-20 LAB — CBC WITH DIFFERENTIAL (CANCER CENTER ONLY)
Abs Immature Granulocytes: 0.01 10*3/uL (ref 0.00–0.07)
Basophils Absolute: 0.1 10*3/uL (ref 0.0–0.1)
Basophils Relative: 3 %
Eosinophils Absolute: 0.2 10*3/uL (ref 0.0–0.5)
Eosinophils Relative: 9 %
HCT: 38.2 % (ref 36.0–46.0)
Hemoglobin: 13.1 g/dL (ref 12.0–15.0)
Immature Granulocytes: 1 %
Lymphocytes Relative: 30 %
Lymphs Abs: 0.6 10*3/uL — ABNORMAL LOW (ref 0.7–4.0)
MCH: 30.3 pg (ref 26.0–34.0)
MCHC: 34.3 g/dL (ref 30.0–36.0)
MCV: 88.4 fL (ref 80.0–100.0)
Monocytes Absolute: 0.3 10*3/uL (ref 0.1–1.0)
Monocytes Relative: 13 %
Neutro Abs: 0.9 10*3/uL — ABNORMAL LOW (ref 1.7–7.7)
Neutrophils Relative %: 44 %
Platelet Count: 134 10*3/uL — ABNORMAL LOW (ref 150–400)
RBC: 4.32 MIL/uL (ref 3.87–5.11)
RDW: 14.5 % (ref 11.5–15.5)
WBC Count: 2 10*3/uL — ABNORMAL LOW (ref 4.0–10.5)
nRBC: 0 % (ref 0.0–0.2)

## 2022-08-20 LAB — CMP (CANCER CENTER ONLY)
ALT: 10 U/L (ref 0–44)
AST: 13 U/L — ABNORMAL LOW (ref 15–41)
Albumin: 4.3 g/dL (ref 3.5–5.0)
Alkaline Phosphatase: 75 U/L (ref 38–126)
Anion gap: 7 (ref 5–15)
BUN: 25 mg/dL — ABNORMAL HIGH (ref 8–23)
CO2: 24 mmol/L (ref 22–32)
Calcium: 8.8 mg/dL — ABNORMAL LOW (ref 8.9–10.3)
Chloride: 109 mmol/L (ref 98–111)
Creatinine: 1.93 mg/dL — ABNORMAL HIGH (ref 0.44–1.00)
GFR, Estimated: 28 mL/min — ABNORMAL LOW (ref >60)
Glucose, Bld: 101 mg/dL — ABNORMAL HIGH (ref 70–99)
Potassium: 4.5 mmol/L (ref 3.5–5.1)
Sodium: 140 mmol/L (ref 135–145)
Total Bilirubin: 0.4 mg/dL (ref 0.3–1.2)
Total Protein: 6.8 g/dL (ref 6.5–8.1)

## 2022-08-20 LAB — LACTATE DEHYDROGENASE: LDH: 126 U/L (ref 98–192)

## 2022-08-21 LAB — KAPPA/LAMBDA LIGHT CHAINS
Kappa free light chain: 17.8 mg/L (ref 3.3–19.4)
Kappa, lambda light chain ratio: 1.32 (ref 0.26–1.65)
Lambda free light chains: 13.5 mg/L (ref 5.7–26.3)

## 2022-08-21 LAB — BETA 2 MICROGLOBULIN, SERUM: Beta-2 Microglobulin: 2.9 mg/L — ABNORMAL HIGH (ref 0.6–2.4)

## 2022-08-24 LAB — MULTIPLE MYELOMA PANEL, SERUM
Albumin SerPl Elph-Mcnc: 4.1 g/dL (ref 2.9–4.4)
Albumin/Glob SerPl: 1.9 — ABNORMAL HIGH (ref 0.7–1.7)
Alpha 1: 0.2 g/dL (ref 0.0–0.4)
Alpha2 Glob SerPl Elph-Mcnc: 0.6 g/dL (ref 0.4–1.0)
B-Globulin SerPl Elph-Mcnc: 0.8 g/dL (ref 0.7–1.3)
Gamma Glob SerPl Elph-Mcnc: 0.6 g/dL (ref 0.4–1.8)
Globulin, Total: 2.2 g/dL (ref 2.2–3.9)
IgA: 33 mg/dL — ABNORMAL LOW (ref 87–352)
IgG (Immunoglobin G), Serum: 766 mg/dL (ref 586–1602)
IgM (Immunoglobulin M), Srm: 28 mg/dL (ref 26–217)
Total Protein ELP: 6.3 g/dL (ref 6.0–8.5)

## 2022-09-01 ENCOUNTER — Other Ambulatory Visit: Payer: Self-pay | Admitting: Adult Health

## 2022-09-01 DIAGNOSIS — F419 Anxiety disorder, unspecified: Secondary | ICD-10-CM

## 2022-09-10 ENCOUNTER — Other Ambulatory Visit: Payer: Self-pay | Admitting: Hematology and Oncology

## 2022-09-10 DIAGNOSIS — C9000 Multiple myeloma not having achieved remission: Secondary | ICD-10-CM

## 2022-09-11 ENCOUNTER — Other Ambulatory Visit: Payer: Self-pay

## 2022-09-11 DIAGNOSIS — C9000 Multiple myeloma not having achieved remission: Secondary | ICD-10-CM

## 2022-09-11 MED ORDER — LENALIDOMIDE 2.5 MG PO CAPS: 2.5000 mg | ORAL_CAPSULE | Freq: Every day | ORAL | 0 refills | Status: DC

## 2022-09-12 ENCOUNTER — Telehealth: Payer: Self-pay

## 2022-09-12 NOTE — Telephone Encounter (Signed)
Confirmed with Dr. Leonides Schanz and pharmacist that this patient is OK to take Revlimid 2.5 mg every other day for 21 days and off for 7 days.

## 2022-09-17 ENCOUNTER — Inpatient Hospital Stay: Payer: 59 | Attending: Hematology and Oncology

## 2022-09-17 DIAGNOSIS — C9030 Solitary plasmacytoma not having achieved remission: Secondary | ICD-10-CM | POA: Insufficient documentation

## 2022-09-17 DIAGNOSIS — C9001 Multiple myeloma in remission: Secondary | ICD-10-CM

## 2022-09-17 LAB — CMP (CANCER CENTER ONLY)
ALT: 10 U/L (ref 0–44)
AST: 13 U/L — ABNORMAL LOW (ref 15–41)
Albumin: 4.2 g/dL (ref 3.5–5.0)
Alkaline Phosphatase: 72 U/L (ref 38–126)
Anion gap: 6 (ref 5–15)
BUN: 24 mg/dL — ABNORMAL HIGH (ref 8–23)
CO2: 24 mmol/L (ref 22–32)
Calcium: 9 mg/dL (ref 8.9–10.3)
Chloride: 109 mmol/L (ref 98–111)
Creatinine: 1.9 mg/dL — ABNORMAL HIGH (ref 0.44–1.00)
GFR, Estimated: 28 mL/min — ABNORMAL LOW (ref >60)
Glucose, Bld: 103 mg/dL — ABNORMAL HIGH (ref 70–99)
Potassium: 4.4 mmol/L (ref 3.5–5.1)
Sodium: 139 mmol/L (ref 135–145)
Total Bilirubin: 0.5 mg/dL (ref 0.3–1.2)
Total Protein: 6.7 g/dL (ref 6.5–8.1)

## 2022-09-17 LAB — LACTATE DEHYDROGENASE: LDH: 123 U/L (ref 98–192)

## 2022-09-17 LAB — CBC WITH DIFFERENTIAL (CANCER CENTER ONLY)
Abs Immature Granulocytes: 0.01 10*3/uL (ref 0.00–0.07)
Basophils Absolute: 0.1 10*3/uL (ref 0.0–0.1)
Basophils Relative: 3 %
Eosinophils Absolute: 0.1 10*3/uL (ref 0.0–0.5)
Eosinophils Relative: 5 %
HCT: 37.7 % (ref 36.0–46.0)
Hemoglobin: 12.8 g/dL (ref 12.0–15.0)
Immature Granulocytes: 1 %
Lymphocytes Relative: 29 %
Lymphs Abs: 0.6 10*3/uL — ABNORMAL LOW (ref 0.7–4.0)
MCH: 30.4 pg (ref 26.0–34.0)
MCHC: 34 g/dL (ref 30.0–36.0)
MCV: 89.5 fL (ref 80.0–100.0)
Monocytes Absolute: 0.2 10*3/uL (ref 0.1–1.0)
Monocytes Relative: 12 %
Neutro Abs: 1 10*3/uL — ABNORMAL LOW (ref 1.7–7.7)
Neutrophils Relative %: 50 %
Platelet Count: 126 10*3/uL — ABNORMAL LOW (ref 150–400)
RBC: 4.21 MIL/uL (ref 3.87–5.11)
RDW: 14.3 % (ref 11.5–15.5)
WBC Count: 2 10*3/uL — ABNORMAL LOW (ref 4.0–10.5)
nRBC: 0 % (ref 0.0–0.2)

## 2022-09-18 LAB — BETA 2 MICROGLOBULIN, SERUM: Beta-2 Microglobulin: 2.9 mg/L — ABNORMAL HIGH (ref 0.6–2.4)

## 2022-09-18 LAB — KAPPA/LAMBDA LIGHT CHAINS
Kappa free light chain: 16.1 mg/L (ref 3.3–19.4)
Kappa, lambda light chain ratio: 1.2 (ref 0.26–1.65)
Lambda free light chains: 13.4 mg/L (ref 5.7–26.3)

## 2022-09-21 LAB — MULTIPLE MYELOMA PANEL, SERUM
Albumin SerPl Elph-Mcnc: 3.8 g/dL (ref 2.9–4.4)
Albumin/Glob SerPl: 1.7 (ref 0.7–1.7)
Alpha 1: 0.2 g/dL (ref 0.0–0.4)
Alpha2 Glob SerPl Elph-Mcnc: 0.6 g/dL (ref 0.4–1.0)
B-Globulin SerPl Elph-Mcnc: 0.8 g/dL (ref 0.7–1.3)
Gamma Glob SerPl Elph-Mcnc: 0.7 g/dL (ref 0.4–1.8)
Globulin, Total: 2.3 g/dL (ref 2.2–3.9)
IgA: 33 mg/dL — ABNORMAL LOW (ref 87–352)
IgG (Immunoglobin G), Serum: 800 mg/dL (ref 586–1602)
IgM (Immunoglobulin M), Srm: 28 mg/dL (ref 26–217)
Total Protein ELP: 6.1 g/dL (ref 6.0–8.5)

## 2022-10-16 ENCOUNTER — Other Ambulatory Visit: Payer: Self-pay | Admitting: Hematology and Oncology

## 2022-10-16 DIAGNOSIS — C9000 Multiple myeloma not having achieved remission: Secondary | ICD-10-CM

## 2022-10-17 ENCOUNTER — Other Ambulatory Visit: Payer: Self-pay

## 2022-10-17 DIAGNOSIS — C9000 Multiple myeloma not having achieved remission: Secondary | ICD-10-CM

## 2022-10-17 MED ORDER — LENALIDOMIDE 2.5 MG PO CAPS: 2.5000 mg | ORAL_CAPSULE | Freq: Every day | ORAL | 0 refills | Status: DC

## 2022-10-21 ENCOUNTER — Other Ambulatory Visit: Payer: Self-pay | Admitting: Physician Assistant

## 2022-10-21 DIAGNOSIS — C9001 Multiple myeloma in remission: Secondary | ICD-10-CM

## 2022-10-22 ENCOUNTER — Inpatient Hospital Stay (HOSPITAL_BASED_OUTPATIENT_CLINIC_OR_DEPARTMENT_OTHER): Payer: 59 | Admitting: Physician Assistant

## 2022-10-22 ENCOUNTER — Inpatient Hospital Stay: Payer: 59 | Attending: Hematology and Oncology

## 2022-10-22 ENCOUNTER — Encounter: Payer: Self-pay | Admitting: Hematology and Oncology

## 2022-10-22 VITALS — BP 138/97 | HR 64 | Temp 98.2°F | Resp 18 | Ht 67.0 in | Wt 184.5 lb

## 2022-10-22 DIAGNOSIS — Z808 Family history of malignant neoplasm of other organs or systems: Secondary | ICD-10-CM | POA: Diagnosis not present

## 2022-10-22 DIAGNOSIS — N289 Disorder of kidney and ureter, unspecified: Secondary | ICD-10-CM | POA: Diagnosis not present

## 2022-10-22 DIAGNOSIS — C9001 Multiple myeloma in remission: Secondary | ICD-10-CM

## 2022-10-22 DIAGNOSIS — C9030 Solitary plasmacytoma not having achieved remission: Secondary | ICD-10-CM | POA: Diagnosis present

## 2022-10-22 DIAGNOSIS — D696 Thrombocytopenia, unspecified: Secondary | ICD-10-CM | POA: Diagnosis not present

## 2022-10-22 DIAGNOSIS — C9000 Multiple myeloma not having achieved remission: Secondary | ICD-10-CM | POA: Diagnosis not present

## 2022-10-22 DIAGNOSIS — D709 Neutropenia, unspecified: Secondary | ICD-10-CM | POA: Diagnosis not present

## 2022-10-22 LAB — CBC WITH DIFFERENTIAL (CANCER CENTER ONLY)
Abs Immature Granulocytes: 0.01 10*3/uL (ref 0.00–0.07)
Basophils Absolute: 0.1 10*3/uL (ref 0.0–0.1)
Basophils Relative: 2 %
Eosinophils Absolute: 0.1 10*3/uL (ref 0.0–0.5)
Eosinophils Relative: 5 %
HCT: 36.8 % (ref 36.0–46.0)
Hemoglobin: 12.5 g/dL (ref 12.0–15.0)
Immature Granulocytes: 0 %
Lymphocytes Relative: 28 %
Lymphs Abs: 0.7 10*3/uL (ref 0.7–4.0)
MCH: 30.6 pg (ref 26.0–34.0)
MCHC: 34 g/dL (ref 30.0–36.0)
MCV: 90.2 fL (ref 80.0–100.0)
Monocytes Absolute: 0.3 10*3/uL (ref 0.1–1.0)
Monocytes Relative: 12 %
Neutro Abs: 1.3 10*3/uL — ABNORMAL LOW (ref 1.7–7.7)
Neutrophils Relative %: 53 %
Platelet Count: 132 10*3/uL — ABNORMAL LOW (ref 150–400)
RBC: 4.08 MIL/uL (ref 3.87–5.11)
RDW: 14.1 % (ref 11.5–15.5)
WBC Count: 2.5 10*3/uL — ABNORMAL LOW (ref 4.0–10.5)
nRBC: 0 % (ref 0.0–0.2)

## 2022-10-22 LAB — CMP (CANCER CENTER ONLY)
ALT: 18 U/L (ref 0–44)
AST: 17 U/L (ref 15–41)
Albumin: 4 g/dL (ref 3.5–5.0)
Alkaline Phosphatase: 72 U/L (ref 38–126)
Anion gap: 8 (ref 5–15)
BUN: 24 mg/dL — ABNORMAL HIGH (ref 8–23)
CO2: 23 mmol/L (ref 22–32)
Calcium: 8.8 mg/dL — ABNORMAL LOW (ref 8.9–10.3)
Chloride: 108 mmol/L (ref 98–111)
Creatinine: 1.51 mg/dL — ABNORMAL HIGH (ref 0.44–1.00)
GFR, Estimated: 37 mL/min — ABNORMAL LOW (ref >60)
Glucose, Bld: 93 mg/dL (ref 70–99)
Potassium: 4.2 mmol/L (ref 3.5–5.1)
Sodium: 139 mmol/L (ref 135–145)
Total Bilirubin: 0.7 mg/dL (ref 0.3–1.2)
Total Protein: 6.7 g/dL (ref 6.5–8.1)

## 2022-10-22 NOTE — Progress Notes (Signed)
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  Serological testing concerning for a plasma cell neoplasm, with UPEP showing 7.4 g of protein daily with markedly high lambda light chains -11/07/2020: Bone marrow biopsy performed, shows no evidence of multiple myeloma -11/23/2020: Biopsy of right clavicle mass confirmed plasmacytoma.  -11/27/2020: PET scan show intensely hypermetabolic large soft tissue masses arising from the LEFT and RIGHT ribs. Two large lesions in the LEFT chest wall and a single large lesion in the RIGHT chest wall.Hypermetabolic expansile solid masses arising from the medial RIGHT clavicle and RIGHT inferior pubic ischium 01/09/2021: Cycle 1, Day 1 CyBorD 02/06/2021: Cycle 2, Day 1 CyBorD 03/06/2021: Cycle 3, Day 1 CyBorD 04/03/2021: Cycle 4, Day 1 CyBorD  05/01/2021: Cycle 5, Day 1 CyBorD  05/29/2021: Cycle 6, Day 1 CyBorD  06/26/2021: Cycle 7, Day 1 CyBorD  07/29/2021: Started maintenance Revlimid 5 mg once daily, 21 days on, 7 days off every 28 days.  10/2021: Decreased to maintenance Revlimid 2.5 mg once daily, 21 days on, 7 days off every 28 days.  04/2022: Decreased to maintenance Revlimid 2.5 mg every other day, 21 days on, 7 days off every 28 days.   Interval History:  Jordan Gregory 67 y.o. 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 The patient was last seen on 07/23/2022.  In the  interim, she has continued maintenance Revlimid therapy.   On exam today Jordan Gregory reports stable energy and is able to complete her ADLs on her own. She admits that she is working on building her stamina as it is not back to her baseline before her diagnosis. Her appetite and energy levels are stable. She denies nausea, vomiting or bowel habit changes. She reports occasional episodes of sharp abdominal pain when she bends over or twists her body. She reports the pain resolves on its own. She denies easy bruising or signs of bleeding. She denies fevers, chills, night sweats, shortness of breath, chest pain or cough.  She has no other complaints.  Rest of the 10 point ROS is below.  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HCC)    Thyroid disease    Vitamin D deficiency     SURGICAL HISTORY: Past Surgical History:  Procedure Laterality Date   MOHS SURGERY  2016    SOCIAL HISTORY: Social History   Socioeconomic History   Marital status: Married    Spouse name: Not on file   Number of children: Not on file   Years of education: Not on file   Highest education level: Not on file  Occupational History   Not on file  Tobacco Use   Smoking status: Never   Smokeless tobacco: Never  Vaping Use   Vaping status: Never Used  Substance and Sexual Activity   Alcohol use: Yes    Alcohol/week: 5.0 standard drinks of alcohol    Types: 5 Standard drinks or equivalent per week  Comment: regular   Drug use: No   Sexual activity: Yes    Birth control/protection: Post-menopausal    Comment: 1st intercourse 43 yo-5 partners  Other Topics Concern   Not on file  Social History Narrative   She works at [employer] and [employer].    Married - 23 years    No kids       She likes to travel, read, cycle, be outside.    Social Determinants of Health   Financial Resource Strain: Not on file  Food Insecurity: Not on file  Transportation  Needs: No Transportation Needs (10/18/2020)   PRAPARE - Administrator, Civil Service (Medical): No    Lack of Transportation (Non-Medical): No  Physical Activity: Not on file  Stress: Not on file  Social Connections: Not on file  Intimate Partner Violence: Not At Risk (12/04/2020)   Humiliation, Afraid, Rape, and Kick questionnaire    Fear of Current or Ex-Partner: No    Emotionally Abused: No    Physically Abused: No    Sexually Abused: No    FAMILY HISTORY: Family History  Problem Relation Age of Onset   Arthritis Mother    Hypertension Mother    Dementia Mother    Heart disease Father    Liver cancer Father    Asperger's syndrome Brother    Atrial fibrillation Brother     ALLERGIES:  is allergic to ramipril.  MEDICATIONS:  Current Outpatient Medications  Medication Sig Dispense Refill   aspirin EC 81 MG tablet Take 81 mg by mouth daily. Swallow whole.     calcium carbonate (OS-CAL) 600 MG TABS Take 600 mg by mouth daily.     citalopram (CELEXA) 10 MG tablet TAKE 1 TABLET BY MOUTH EVERY DAY 90 tablet 0   lenalidomide (REVLIMID) 2.5 MG capsule Take 1 capsule (2.5 mg total) by mouth daily. Take 1 capsule (2.5 mg total) for 21 days and then 7 days off. Repeat cycle. 21 capsule 0   levothyroxine (SYNTHROID) 75 MCG tablet TAKE 1 TABLET BY MOUTH EVERY DAY BEFORE BREAKFAST 90 tablet 3   traZODone (DESYREL) 50 MG tablet TAKE 1/2 TO 1 TABLET BY MOUTH AT BEDTIME AS NEEDED FOR SLEEP 90 tablet 1   VITAMIN D, CHOLECALCIFEROL, PO Take 2,000 Int'l Units by mouth daily.     amLODipine (NORVASC) 10 MG tablet Take 10 mg by mouth daily. (Patient not taking: Reported on 12/19/2021)     gabapentin (NEURONTIN) 300 MG capsule TAKE 1 CAPSULE BY MOUTH THREE TIMES A DAY (Patient not taking: Reported on 04/29/2022) 90 capsule 1   ondansetron (ZOFRAN) 8 MG tablet Take 1 tablet (8 mg total) by mouth every 8 (eight) hours as needed for nausea or vomiting. (Patient not taking: Reported on  12/19/2021) 60 tablet 2   prochlorperazine (COMPAZINE) 10 MG tablet Take 1 tablet (10 mg total) by mouth every 6 (six) hours as needed for nausea or vomiting. (Patient not taking: Reported on 12/19/2021) 60 tablet 2   No current facility-administered medications for this visit.    REVIEW OF SYSTEMS:   Constitutional: ( - ) fevers, ( - )  chills , ( - ) night sweats Eyes: ( - ) blurriness of vision, ( - ) double vision, ( - ) watery eyes Ears, nose, mouth, throat, and face: ( - ) mucositis, ( - ) sore throat Respiratory: ( - ) cough, ( - ) dyspnea, ( - ) wheezes Cardiovascular: ( - ) palpitation, ( - ) chest  discomfort, ( - ) lower extremity swelling Gastrointestinal:  ( - ) nausea, ( - ) heartburn, ( + ) change in bowel habits Skin: ( - ) abnormal skin rashes Lymphatics: ( - ) new lymphadenopathy, ( - ) easy bruising Neurological: ( + ) numbness, ( - ) tingling, ( - ) new weaknesses Behavioral/Psych: ( - ) mood change, ( - ) new changes  All other systems were reviewed with the patient and are negative.  PHYSICAL EXAMINATION: ECOG PERFORMANCE STATUS: 1 - Symptomatic but completely ambulatory  Vitals:   10/22/22 1126  BP: (!) 138/97  Pulse: 64  Resp: 18  Temp: 98.2 F (36.8 C)  SpO2: 100%     Filed Weights   10/22/22 1126  Weight: 184 lb 8 oz (83.7 kg)    GENERAL: Well-appearing middle-age Caucasian female, alert, no distress and comfortable SKIN: skin color, texture, turgor are normal, no rashes or significant lesions EYES: conjunctiva are pink and non-injected, sclera clear LUNGS: clear to auscultation and percussion with normal breathing effort HEART: regular rate & rhythm and no murmurs and no lower extremity edema Musculoskeletal: no cyanosis of digits and no clubbing  PSYCH: alert & oriented x 3, fluent speech NEURO: no focal motor/sensory deficits  LABORATORY DATA:  I have reviewed the data as listed    Latest Ref Rng & Units 10/22/2022   10:42 AM 09/17/2022    10:43 AM 08/20/2022   10:59 AM  CBC  WBC 4.0 - 10.5 K/uL 2.5  2.0  2.0   Hemoglobin 12.0 - 15.0 g/dL 29.5  62.1  30.8   Hematocrit 36.0 - 46.0 % 36.8  37.7  38.2   Platelets 150 - 400 K/uL 132  126  134        Latest Ref Rng & Units 10/22/2022   10:42 AM 09/17/2022   10:43 AM 08/20/2022   10:59 AM  CMP  Glucose 70 - 99 mg/dL 93  657  846   BUN 8 - 23 mg/dL 24  24  25    Creatinine 0.44 - 1.00 mg/dL 9.62  9.52  8.41   Sodium 135 - 145 mmol/L 139  139  140   Potassium 3.5 - 5.1 mmol/L 4.2  4.4  4.5   Chloride 98 - 111 mmol/L 108  109  109   CO2 22 - 32 mmol/L 23  24  24    Calcium 8.9 - 10.3 mg/dL 8.8  9.0  8.8   Total Protein 6.5 - 8.1 g/dL 6.7  6.7  6.8   Total Bilirubin 0.3 - 1.2 mg/dL 0.7  0.5  0.4   Alkaline Phos 38 - 126 U/L 72  72  75   AST 15 - 41 U/L 17  13  13    ALT 0 - 44 U/L 18  10  10      Lab Results  Component Value Date   MPROTEIN Not Observed 09/17/2022   MPROTEIN Not Observed 08/20/2022   MPROTEIN Not Observed 07/23/2022   Lab Results  Component Value Date   KPAFRELGTCHN 16.1 09/17/2022   KPAFRELGTCHN 17.8 08/20/2022   KPAFRELGTCHN 15.6 07/23/2022   LAMBDASER 13.4 09/17/2022   LAMBDASER 13.5 08/20/2022   LAMBDASER 12.5 07/23/2022   KAPLAMBRATIO 1.20 09/17/2022   KAPLAMBRATIO 1.32 08/20/2022   KAPLAMBRATIO 1.25 07/23/2022     RADIOGRAPHIC STUDIES: No results found.  ASSESSMENT & PLAN Jordan Gregory is a 67 y.o. female with medical history significant for plasmacytomas without multiple myeloma who presents for a follow up  visit.  Previously we discussed the diagnosis and treatment options for plasmacytomas without multiple myeloma. This includes radiation therapy verus chemotherapy. Dr. Leonides Schanz discussed case with Dr. Alvino Chapel from [facility] and the recommendation was to proceed with chemotherapy due to patients decline in renal function.  Given the patient's kidney dysfunction at this time the treatment of choice was CyBorD chemotherapy.  Patient  received 7 cycles of CyBorD from 01/09/2021-06/26/2021.   Ms. Railey transitioned to maintenance Revlimid 5 mg PO daily starting 07/29/2021.   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   --Serological studies are confirmatory of a plasma cell neoplasm with markedly high lambda light chains and proteinuria.  --Bone marrow biopsy from 11/07/2020 showed no evidence of multiple myeloma. Right clavicle biopsy from 11/23/2020 confirmed plasma cell neoplasm consistent with plasmacytomas.  --PET scan from 11/27/2020 showed multifocal hypermetabolic soft tissue massing arising from the left and right ribs, left and right chest wall, right clavicle and right inferior pubic ischium. Largest is solid mass in the anterior left chest wall measuring 10.2 x 9.9 cm.  --Recommended to initiate systemic chemotherapy due to acute renal dysfunction.  --Received 7 cycles of CyBorD from 01/09/2021 to 06/26/2021 --Starting 07/29/2021, started maintenance therapy revlimid 5 mg PO daily 21 days on 7 days off.  Plan: --Most recent M protein was undetectable on 09/17/2022. M protein was not detected and normal serum free light chains.  --Labs today were reviewed and showed WBC 2.5, Hgb 12.5, MCV 90.2, Plt 132 --continue Revlimid dose 2.5 mg  PO every other day, 21 days on and 7 days off.  --RTC for monthly labs and toxicity check in 3 months.   #Neutropenia/Thrombocytopenia: --Secondary to revlimid --Labs today show WBC 2.5, ANC 1.3, Plt 132   #Renal dysfunction--improved: --Secondary to damage from malignancy.  --Creatinine has improved to 1.51 --Continue to monitor closely.   #Supportive Care -- port placement not required -- zofran 8mg  q8H PRN and compazine 10mg  PO q6H for nausea -- acyclovir 400mg  PO BID for VCZ prophylaxis -- stool softeners/laxatives for constipation as needed  No orders of the defined types were placed in this  encounter.  All questions were answered. The patient knows to call the clinic with any problems, questions or concerns.  I have spent a total of 30 minutes minutes of face-to-face and non-face-to-face time, preparing to see the patient, performing a medically appropriate examination, counseling and educating the patient, ordering medications, documenting clinical information in the electronic health record,  and care coordination.   Georga Kaufmann PA-C Dept of Hematology and Oncology [facility] Phone: [phone]

## 2022-10-23 ENCOUNTER — Other Ambulatory Visit: Payer: Self-pay | Admitting: Physician Assistant

## 2022-10-24 LAB — KAPPA/LAMBDA LIGHT CHAINS
Kappa free light chain: 16.3 mg/L (ref 3.3–19.4)
Kappa, lambda light chain ratio: 1.23 (ref 0.26–1.65)
Lambda free light chains: 13.2 mg/L (ref 5.7–26.3)

## 2022-10-27 LAB — MULTIPLE MYELOMA PANEL, SERUM
Albumin SerPl Elph-Mcnc: 3.6 g/dL (ref 2.9–4.4)
Albumin/Glob SerPl: 1.4 (ref 0.7–1.7)
Alpha 1: 0.2 g/dL (ref 0.0–0.4)
Alpha2 Glob SerPl Elph-Mcnc: 0.7 g/dL (ref 0.4–1.0)
B-Globulin SerPl Elph-Mcnc: 0.9 g/dL (ref 0.7–1.3)
Gamma Glob SerPl Elph-Mcnc: 0.8 g/dL (ref 0.4–1.8)
Globulin, Total: 2.6 g/dL (ref 2.2–3.9)
IgA: 33 mg/dL — ABNORMAL LOW (ref 87–352)
IgG (Immunoglobin G), Serum: 836 mg/dL (ref 586–1602)
IgM (Immunoglobulin M), Srm: 27 mg/dL (ref 26–217)
Total Protein ELP: 6.2 g/dL (ref 6.0–8.5)

## 2022-10-29 ENCOUNTER — Encounter: Payer: Self-pay | Admitting: Adult Health

## 2022-10-29 LAB — HM MAMMOGRAPHY

## 2022-11-12 ENCOUNTER — Inpatient Hospital Stay: Payer: 59 | Attending: Hematology and Oncology

## 2022-11-12 DIAGNOSIS — C9001 Multiple myeloma in remission: Secondary | ICD-10-CM

## 2022-11-12 DIAGNOSIS — C9030 Solitary plasmacytoma not having achieved remission: Secondary | ICD-10-CM | POA: Diagnosis present

## 2022-11-12 LAB — CBC WITH DIFFERENTIAL (CANCER CENTER ONLY)
Abs Immature Granulocytes: 0 10*3/uL (ref 0.00–0.07)
Basophils Absolute: 0.1 10*3/uL (ref 0.0–0.1)
Basophils Relative: 2 %
Eosinophils Absolute: 0.1 10*3/uL (ref 0.0–0.5)
Eosinophils Relative: 6 %
HCT: 38.8 % (ref 36.0–46.0)
Hemoglobin: 13.6 g/dL (ref 12.0–15.0)
Immature Granulocytes: 0 %
Lymphocytes Relative: 32 %
Lymphs Abs: 0.7 10*3/uL (ref 0.7–4.0)
MCH: 31.1 pg (ref 26.0–34.0)
MCHC: 35.1 g/dL (ref 30.0–36.0)
MCV: 88.8 fL (ref 80.0–100.0)
Monocytes Absolute: 0.2 10*3/uL (ref 0.1–1.0)
Monocytes Relative: 10 %
Neutro Abs: 1 10*3/uL — ABNORMAL LOW (ref 1.7–7.7)
Neutrophils Relative %: 50 %
Platelet Count: 130 10*3/uL — ABNORMAL LOW (ref 150–400)
RBC: 4.37 MIL/uL (ref 3.87–5.11)
RDW: 13.7 % (ref 11.5–15.5)
WBC Count: 2.1 10*3/uL — ABNORMAL LOW (ref 4.0–10.5)
nRBC: 0 % (ref 0.0–0.2)

## 2022-11-12 LAB — CMP (CANCER CENTER ONLY)
ALT: 13 U/L (ref 0–44)
AST: 15 U/L (ref 15–41)
Albumin: 4.6 g/dL (ref 3.5–5.0)
Alkaline Phosphatase: 77 U/L (ref 38–126)
Anion gap: 7 (ref 5–15)
BUN: 33 mg/dL — ABNORMAL HIGH (ref 8–23)
CO2: 25 mmol/L (ref 22–32)
Calcium: 9.3 mg/dL (ref 8.9–10.3)
Chloride: 106 mmol/L (ref 98–111)
Creatinine: 1.93 mg/dL — ABNORMAL HIGH (ref 0.44–1.00)
GFR, Estimated: 28 mL/min — ABNORMAL LOW (ref >60)
Glucose, Bld: 97 mg/dL (ref 70–99)
Potassium: 4.4 mmol/L (ref 3.5–5.1)
Sodium: 138 mmol/L (ref 135–145)
Total Bilirubin: 0.5 mg/dL (ref <1.2)
Total Protein: 7.2 g/dL (ref 6.5–8.1)

## 2022-11-12 LAB — LACTATE DEHYDROGENASE: LDH: 139 U/L (ref 98–192)

## 2022-11-13 LAB — KAPPA/LAMBDA LIGHT CHAINS
Kappa free light chain: 18.7 mg/L (ref 3.3–19.4)
Kappa, lambda light chain ratio: 1.27 (ref 0.26–1.65)
Lambda free light chains: 14.7 mg/L (ref 5.7–26.3)

## 2022-11-13 LAB — BETA 2 MICROGLOBULIN, SERUM: Beta-2 Microglobulin: 2.8 mg/L — ABNORMAL HIGH (ref 0.6–2.4)

## 2022-11-15 LAB — LAB REPORT - SCANNED
Albumin, Urine POC: 5.2
Albumin/Creatinine Ratio, Urine, POC: 6
Creatinine, POC: 87.3 mg/dL

## 2022-11-17 LAB — MULTIPLE MYELOMA PANEL, SERUM
Albumin SerPl Elph-Mcnc: 3.9 g/dL (ref 2.9–4.4)
Albumin/Glob SerPl: 1.6 (ref 0.7–1.7)
Alpha 1: 0.2 g/dL (ref 0.0–0.4)
Alpha2 Glob SerPl Elph-Mcnc: 0.6 g/dL (ref 0.4–1.0)
B-Globulin SerPl Elph-Mcnc: 0.8 g/dL (ref 0.7–1.3)
Gamma Glob SerPl Elph-Mcnc: 0.7 g/dL (ref 0.4–1.8)
Globulin, Total: 2.5 g/dL (ref 2.2–3.9)
IgA: 37 mg/dL — ABNORMAL LOW (ref 87–352)
IgG (Immunoglobin G), Serum: 831 mg/dL (ref 586–1602)
IgM (Immunoglobulin M), Srm: 33 mg/dL (ref 26–217)
Total Protein ELP: 6.4 g/dL (ref 6.0–8.5)

## 2022-11-18 ENCOUNTER — Other Ambulatory Visit: Payer: Self-pay | Admitting: Hematology and Oncology

## 2022-11-18 DIAGNOSIS — C9000 Multiple myeloma not having achieved remission: Secondary | ICD-10-CM

## 2022-11-19 ENCOUNTER — Telehealth: Payer: Self-pay | Admitting: *Deleted

## 2022-11-19 ENCOUNTER — Other Ambulatory Visit: Payer: Self-pay | Admitting: *Deleted

## 2022-11-19 DIAGNOSIS — C9000 Multiple myeloma not having achieved remission: Secondary | ICD-10-CM

## 2022-11-19 MED ORDER — LENALIDOMIDE 2.5 MG PO CAPS: 2.5000 mg | ORAL_CAPSULE | Freq: Every day | ORAL | 0 refills | Status: DC

## 2022-11-19 NOTE — Telephone Encounter (Signed)
TCT patient regarding her Revlimid. Spoke with her and advised that Dr. Leonides Schanz wants her to continue taking Revlimid every other day for noe and that her next prescription is for only 11 tablets.  Pt voiced understanding. No questions or concerns

## 2022-12-10 ENCOUNTER — Inpatient Hospital Stay: Payer: 59 | Attending: Hematology and Oncology

## 2022-12-10 DIAGNOSIS — C9030 Solitary plasmacytoma not having achieved remission: Secondary | ICD-10-CM | POA: Insufficient documentation

## 2022-12-10 DIAGNOSIS — C9001 Multiple myeloma in remission: Secondary | ICD-10-CM

## 2022-12-10 LAB — CBC WITH DIFFERENTIAL (CANCER CENTER ONLY)
Abs Immature Granulocytes: 0.01 10*3/uL (ref 0.00–0.07)
Basophils Absolute: 0.1 10*3/uL (ref 0.0–0.1)
Basophils Relative: 3 %
Eosinophils Absolute: 0.2 10*3/uL (ref 0.0–0.5)
Eosinophils Relative: 8 %
HCT: 40.7 % (ref 36.0–46.0)
Hemoglobin: 13.7 g/dL (ref 12.0–15.0)
Immature Granulocytes: 0 %
Lymphocytes Relative: 27 %
Lymphs Abs: 0.6 10*3/uL — ABNORMAL LOW (ref 0.7–4.0)
MCH: 30.4 pg (ref 26.0–34.0)
MCHC: 33.7 g/dL (ref 30.0–36.0)
MCV: 90.2 fL (ref 80.0–100.0)
Monocytes Absolute: 0.3 10*3/uL (ref 0.1–1.0)
Monocytes Relative: 13 %
Neutro Abs: 1.1 10*3/uL — ABNORMAL LOW (ref 1.7–7.7)
Neutrophils Relative %: 49 %
Platelet Count: 145 10*3/uL — ABNORMAL LOW (ref 150–400)
RBC: 4.51 MIL/uL (ref 3.87–5.11)
RDW: 13.6 % (ref 11.5–15.5)
WBC Count: 2.3 10*3/uL — ABNORMAL LOW (ref 4.0–10.5)
nRBC: 0 % (ref 0.0–0.2)

## 2022-12-10 LAB — LACTATE DEHYDROGENASE: LDH: 133 U/L (ref 98–192)

## 2022-12-10 LAB — CMP (CANCER CENTER ONLY)
ALT: 11 U/L (ref 0–44)
AST: 14 U/L — ABNORMAL LOW (ref 15–41)
Albumin: 4.4 g/dL (ref 3.5–5.0)
Alkaline Phosphatase: 74 U/L (ref 38–126)
Anion gap: 5 (ref 5–15)
BUN: 24 mg/dL — ABNORMAL HIGH (ref 8–23)
CO2: 27 mmol/L (ref 22–32)
Calcium: 9.3 mg/dL (ref 8.9–10.3)
Chloride: 108 mmol/L (ref 98–111)
Creatinine: 1.84 mg/dL — ABNORMAL HIGH (ref 0.44–1.00)
GFR, Estimated: 30 mL/min — ABNORMAL LOW (ref >60)
Glucose, Bld: 96 mg/dL (ref 70–99)
Potassium: 4.4 mmol/L (ref 3.5–5.1)
Sodium: 140 mmol/L (ref 135–145)
Total Bilirubin: 0.4 mg/dL (ref <1.2)
Total Protein: 6.7 g/dL (ref 6.5–8.1)

## 2022-12-11 LAB — KAPPA/LAMBDA LIGHT CHAINS
Kappa free light chain: 16.4 mg/L (ref 3.3–19.4)
Kappa, lambda light chain ratio: 1.2 (ref 0.26–1.65)
Lambda free light chains: 13.7 mg/L (ref 5.7–26.3)

## 2022-12-11 LAB — BETA 2 MICROGLOBULIN, SERUM: Beta-2 Microglobulin: 2.5 mg/L — ABNORMAL HIGH (ref 0.6–2.4)

## 2022-12-16 LAB — MULTIPLE MYELOMA PANEL, SERUM
Albumin SerPl Elph-Mcnc: 3.9 g/dL (ref 2.9–4.4)
Albumin/Glob SerPl: 1.4 (ref 0.7–1.7)
Alpha 1: 0.2 g/dL (ref 0.0–0.4)
Alpha2 Glob SerPl Elph-Mcnc: 0.7 g/dL (ref 0.4–1.0)
B-Globulin SerPl Elph-Mcnc: 1 g/dL (ref 0.7–1.3)
Gamma Glob SerPl Elph-Mcnc: 0.8 g/dL (ref 0.4–1.8)
Globulin, Total: 2.8 g/dL (ref 2.2–3.9)
IgA: 35 mg/dL — ABNORMAL LOW (ref 87–352)
IgG (Immunoglobin G), Serum: 854 mg/dL (ref 586–1602)
IgM (Immunoglobulin M), Srm: 31 mg/dL (ref 26–217)
Total Protein ELP: 6.7 g/dL (ref 6.0–8.5)

## 2022-12-17 ENCOUNTER — Other Ambulatory Visit: Payer: Self-pay | Admitting: Adult Health

## 2022-12-17 DIAGNOSIS — C9030 Solitary plasmacytoma not having achieved remission: Secondary | ICD-10-CM

## 2022-12-17 DIAGNOSIS — E039 Hypothyroidism, unspecified: Secondary | ICD-10-CM

## 2022-12-23 ENCOUNTER — Other Ambulatory Visit: Payer: Self-pay | Admitting: Hematology and Oncology

## 2022-12-23 DIAGNOSIS — C9000 Multiple myeloma not having achieved remission: Secondary | ICD-10-CM

## 2022-12-25 ENCOUNTER — Other Ambulatory Visit: Payer: Self-pay | Admitting: *Deleted

## 2022-12-25 DIAGNOSIS — C9000 Multiple myeloma not having achieved remission: Secondary | ICD-10-CM

## 2022-12-25 MED ORDER — LENALIDOMIDE 2.5 MG PO CAPS: 2.5000 mg | ORAL_CAPSULE | Freq: Every day | ORAL | 0 refills | Status: DC

## 2023-01-01 NOTE — Progress Notes (Signed)
Subjective:    Patient ID: Jordan Gregory, female    DOB: 1955/10/09, 67 y.o.   MRN: [id number]  HPI Patient presents for yearly preventative medicine examination. She is a pleasant 67 year old female who  has a past medical history of Hypertension, Insomnia, Plasmacytoma (HCC), Thyroid disease, and Vitamin D deficiency.  Multiple Myeloma in remission.-October 2022 MRI showed a pathologic fracture at the medial clavicle associated with 5.1 x 2.7 x 4.1 cm mass.  She was then referred to the [facility] and serological testing concerning for plasma cell neoplasm with UPEP showing 7.4 g of protein daily with markedly high lambda light chains.  Bone marrow biopsy was performed which showed no evidence of multiple myeloma.  Biopsy of the right clavicle mass confirmed plasmacytoma.  She had a PET scan done in November 2022 that showed intensely hypermetabolic large soft tissue masses arising from the left and right ribs.  2 large lesions on the left chest wall and a single large lesion on the right chest wall.  Hypermetabolic expansile solid mass arising from the medial right clavicle and right inferior public ischium.  She then underwent 7 cycles of CyBorD and in July 2023 started maintenance with Revlimid 2.5 mg daily, 21 days off, 7 days off every 28 days.  Overall she is feeling well, feels as though her energy is good and she is able to complete her activities of daily living.   Hypothyroidism -managed with Synthroid 75 mcg daily.  Denies symptoms of hyper or hypothyroidism  Insomnia-uses trazodone as needed. Has some trouble falling asleep but when she does fall asleep she sleeps well.   Anxiety and Depression - well controlled on Celexa 10 mg. She wants to continue with this medication.   Loss of hearing - she reports that over the last six month she noticed that she has to turn up the TV to a higher volume and has a hard time understanding soft spoken people.    All  immunizations and health maintenance protocols were reviewed with the patient and needed orders were placed.  Appropriate screening laboratory values were ordered for the patient including screening of hyperlipidemia, renal function and hepatic function.  Medication reconciliation,  past medical history, social history, problem list and allergies were reviewed in detail with the patient  Goals were established with regard to weight loss, exercise, and  diet in compliance with medications Wt Readings from Last 3 Encounters:  01/02/23 184 lb (83.5 kg)  10/22/22 184 lb 8 oz (83.7 kg)  07/23/22 176 lb 14.4 oz (80.2 kg)   She is overdue for colon cancer screening- she will speak with her oncologist.  Review of Systems  Constitutional: Negative.   HENT:  Positive for hearing loss.   Eyes: Negative.   Respiratory: Negative.    Cardiovascular: Negative.   Gastrointestinal: Negative.   Endocrine: Negative.   Genitourinary: Negative.   Musculoskeletal:  Positive for back pain (chronic).  Skin: Negative.   Allergic/Immunologic: Negative.   Neurological: Negative.   Hematological: Negative.   Psychiatric/Behavioral: Negative.     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HCC)    Thyroid disease    Vitamin D deficiency     Social History   Socioeconomic History   Marital status: Married    Spouse name: Not on file   Number of children: Not on file   Years of education: Not on file  Highest education level: Master's degree (e.g., MA, MS, MEng, MEd, MSW, MBA)  Occupational History   Not on file  Tobacco Use   Smoking status: Never   Smokeless tobacco: Never  Vaping Use   Vaping status: Never Used  Substance and Sexual Activity   Alcohol use: Yes    Alcohol/week: 5.0 standard drinks of alcohol    Types: 5 Standard drinks or equivalent per week    Comment: regular   Drug use: No   Sexual activity: Yes    Birth control/protection:  Post-menopausal    Comment: 1st intercourse 36 yo-5 partners  Other Topics Concern   Not on file  Social History Narrative   She works at [employer] and [employer].    Married - 23 years    No kids       She likes to travel, read, cycle, be outside.    Social Drivers of Corporate investment banker Strain: Low Risk  (01/01/2023)   Overall Financial Resource Strain (CARDIA)    Difficulty of Paying Living Expenses: Not hard at all  Food Insecurity: No Food Insecurity (01/01/2023)   Hunger Vital Sign    Worried About Running Out of Food in the Last Year: Never true    Ran Out of Food in the Last Year: Never true  Transportation Needs: No Transportation Needs (01/01/2023)   PRAPARE - Administrator, Civil Service (Medical): No    Lack of Transportation (Non-Medical): No  Physical Activity: Sufficiently Active (01/01/2023)   Exercise Vital Sign    Days of Exercise per Week: 4 days    Minutes of Exercise per Session: 40 min  Stress: Stress Concern Present (01/01/2023)   [employer] of Occupational Health - Occupational Stress Questionnaire    Feeling of Stress : To some extent  Social Connections: Moderately Isolated (01/01/2023)   Social Connection and Isolation Panel [NHANES]    Frequency of Communication with Friends and Family: More than three times a week    Frequency of Social Gatherings with Friends and Family: Once a week    Attends Religious Services: Never    Database administrator or Organizations: No    Attends Engineer, structural: Not on file    Marital Status: Married  Catering manager Violence: Not At Risk (12/04/2020)   Humiliation, Afraid, Rape, and Kick questionnaire    Fear of Current or Ex-Partner: No    Emotionally Abused: No    Physically Abused: No    Sexually Abused: No    Past Surgical History:  Procedure Laterality Date   MOHS SURGERY  2016    Family History  Problem Relation Age of Onset   Arthritis Mother    Hypertension  Mother    Dementia Mother    Heart disease Father    Liver cancer Father    Asperger's syndrome Brother    Atrial fibrillation Brother     Allergies  Allergen Reactions   Ramipril Cough    Current Outpatient Medications on File Prior to Visit  Medication Sig Dispense Refill   aspirin EC 81 MG tablet Take 81 mg by mouth daily. Swallow whole.     calcium carbonate (OS-CAL) 600 MG TABS Take 600 mg by mouth daily.     lenalidomide (REVLIMID) 2.5 MG capsule Take 1 capsule (2.5 mg total) by mouth daily. Take 1 capsule (2.5 mg total) every other day 21 days and then 7 days off. Repeat cycle. 11 capsule 0   levothyroxine (SYNTHROID)  75 MCG tablet TAKE 1 TABLET BY MOUTH EVERY DAY BEFORE BREAKFAST 90 tablet 3   traZODone (DESYREL) 50 MG tablet TAKE 1/2 TO 1 TABLET BY MOUTH AT BEDTIME AS NEEDED FOR SLEEP 90 tablet 1   VITAMIN D, CHOLECALCIFEROL, PO Take 2,000 Int'l Units by mouth daily.     No current facility-administered medications on file prior to visit.    BP 120/80   Pulse 74   Temp (!) 97.4 F (36.3 C) (Oral)   Ht 5' 7.75" (1.721 m)   Wt 184 lb (83.5 kg)   LMP 03/04/2011   SpO2 99%   BMI 28.18 kg/m       Objective:   Physical Exam Vitals and nursing note reviewed.  Constitutional:      General: She is not in acute distress.    Appearance: Normal appearance. She is not ill-appearing.  HENT:     Head: Normocephalic and atraumatic.     Right Ear: Tympanic membrane, ear canal and external ear normal. There is no impacted cerumen.     Left Ear: Tympanic membrane, ear canal and external ear normal. There is no impacted cerumen.     Ears:     Weber exam findings: Does not lateralize.    Right Rinne: AC > BC.    Left Rinne: AC > BC.    Nose: Nose normal. No congestion or rhinorrhea.     Mouth/Throat:     Mouth: Mucous membranes are moist.     Pharynx: Oropharynx is clear.  Eyes:     Extraocular Movements: Extraocular movements intact.     Conjunctiva/sclera: Conjunctivae  normal.     Pupils: Pupils are equal, round, and reactive to light.  Neck:     Vascular: No carotid bruit.  Cardiovascular:     Rate and Rhythm: Normal rate and regular rhythm.     Pulses: Normal pulses.     Heart sounds: No murmur heard.    No friction rub. No gallop.  Pulmonary:     Effort: Pulmonary effort is normal.     Breath sounds: Normal breath sounds.  Abdominal:     General: Abdomen is flat. Bowel sounds are normal. There is no distension.     Palpations: Abdomen is soft. There is no mass.     Tenderness: There is no abdominal tenderness. There is no guarding or rebound.     Hernia: No hernia is present.  Musculoskeletal:        General: Normal range of motion.     Cervical back: Normal range of motion and neck supple.  Lymphadenopathy:     Cervical: No cervical adenopathy.  Skin:    General: Skin is warm and dry.     Capillary Refill: Capillary refill takes less than 2 seconds.  Neurological:     General: No focal deficit present.     Mental Status: She is alert and oriented to person, place, and time.  Psychiatric:        Mood and Affect: Mood normal.        Behavior: Behavior normal.        Thought Content: Thought content normal.        Judgment: Judgment normal.       Assessment & Plan:  1. Routine general medical examination at a health care facility (Primary) Today patient counseled on age appropriate routine health concerns for screening and prevention, each reviewed and up to date or declined. Immunizations reviewed and up to date or declined. Labs  ordered and reviewed. Risk factors for depression reviewed and negative. Hearing function and visual acuity are intact. ADLs screened and addressed as needed. Functional ability and level of safety reviewed and appropriate. Education, counseling and referrals performed based on assessed risks today. Patient provided with a copy of personalized plan for preventive services. - Stay active and eat healthy  - Follow  up in one year or sooner if needed  2. Anxiety and depression - Controlled. No change in medication  - citalopram (CELEXA) 10 MG tablet; Take 1 tablet (10 mg total) by mouth daily.  Dispense: 90 tablet; Refill: 3 - Lipid panel; Future - TSH; Future - TSH - Lipid panel  3. Hypothyroidism, unspecified type - Consider dose change of synthroid  - Lipid panel; Future - TSH; Future - TSH - Lipid panel  4. Multiple myeloma in remission [facility]) - Per oncology  - Lipid panel; Future - TSH; Future - TSH - Lipid panel  5. Insomnia, unspecified type - Continue Trazodone  - Lipid panel; Future - TSH; Future - TSH - Lipid panel  6. Postmenopausal estrogen deficiency  - DG Bone Density; Future - Lipid panel; Future - TSH; Future - VITAMIN D 25 Hydroxy (Vit-D Deficiency, Fractures); Future - VITAMIN D 25 Hydroxy (Vit-D Deficiency, Fractures) - TSH - Lipid panel  7. Bilateral hearing loss, unspecified hearing loss type  - Ambulatory referral to ENT - Lipid panel; Future - TSH; Future - TSH - Lipid panel  8. Need for pneumococcal vaccine  - Pneumococcal conjugate vaccine 20-valent (Prevnar 20)  Shirline Frees, NP

## 2023-01-02 ENCOUNTER — Encounter: Payer: Self-pay | Admitting: Adult Health

## 2023-01-02 ENCOUNTER — Ambulatory Visit (INDEPENDENT_AMBULATORY_CARE_PROVIDER_SITE_OTHER): Payer: 59 | Admitting: Adult Health

## 2023-01-02 VITALS — BP 120/80 | HR 74 | Temp 97.4°F | Ht 67.75 in | Wt 184.0 lb

## 2023-01-02 DIAGNOSIS — C9001 Multiple myeloma in remission: Secondary | ICD-10-CM | POA: Diagnosis not present

## 2023-01-02 DIAGNOSIS — F419 Anxiety disorder, unspecified: Secondary | ICD-10-CM | POA: Diagnosis not present

## 2023-01-02 DIAGNOSIS — E039 Hypothyroidism, unspecified: Secondary | ICD-10-CM | POA: Diagnosis not present

## 2023-01-02 DIAGNOSIS — Z23 Encounter for immunization: Secondary | ICD-10-CM | POA: Diagnosis not present

## 2023-01-02 DIAGNOSIS — Z0000 Encounter for general adult medical examination without abnormal findings: Secondary | ICD-10-CM

## 2023-01-02 DIAGNOSIS — Z1211 Encounter for screening for malignant neoplasm of colon: Secondary | ICD-10-CM

## 2023-01-02 DIAGNOSIS — C9030 Solitary plasmacytoma not having achieved remission: Secondary | ICD-10-CM

## 2023-01-02 DIAGNOSIS — F32A Depression, unspecified: Secondary | ICD-10-CM

## 2023-01-02 DIAGNOSIS — Z780 Asymptomatic menopausal state: Secondary | ICD-10-CM

## 2023-01-02 DIAGNOSIS — G4700 Insomnia, unspecified: Secondary | ICD-10-CM | POA: Diagnosis not present

## 2023-01-02 DIAGNOSIS — H9193 Unspecified hearing loss, bilateral: Secondary | ICD-10-CM

## 2023-01-02 LAB — LIPID PANEL
Cholesterol: 195 mg/dL (ref 0–200)
HDL: 77.9 mg/dL (ref >39.00)
LDL Cholesterol: 101 mg/dL — ABNORMAL HIGH (ref 0–99)
NonHDL: 117.23
Total CHOL/HDL Ratio: 3
Triglycerides: 81 mg/dL (ref 0.0–149.0)
VLDL: 16.2 mg/dL (ref 0.0–40.0)

## 2023-01-02 LAB — TSH: TSH: 2.54 u[IU]/mL (ref 0.35–5.50)

## 2023-01-02 LAB — VITAMIN D 25 HYDROXY (VIT D DEFICIENCY, FRACTURES): VITD: 44.77 ng/mL (ref 30.00–100.00)

## 2023-01-02 MED ORDER — CITALOPRAM HYDROBROMIDE 10 MG PO TABS: 10.0000 mg | ORAL_TABLET | Freq: Every day | ORAL | 3 refills | Status: DC

## 2023-01-02 NOTE — Patient Instructions (Signed)
It was great seeing you today   We will follow up with you regarding your lab work   Please let me know if you need anything   Schedule your bone density test at check out desk. You may also call directly to X-ray at [phone] to schedule an appointment that is convenient for you.  - located [address] across the street from  - in the basement - you do need an appointment for the bone density tests.  

## 2023-01-16 ENCOUNTER — Telehealth (INDEPENDENT_AMBULATORY_CARE_PROVIDER_SITE_OTHER): Payer: Self-pay | Admitting: Physician Assistant

## 2023-01-16 NOTE — Telephone Encounter (Signed)
 Spoke with patient and confirmed address for appointment on 01/19/2023.

## 2023-01-19 ENCOUNTER — Institutional Professional Consult (permissible substitution) (INDEPENDENT_AMBULATORY_CARE_PROVIDER_SITE_OTHER): Payer: Self-pay

## 2023-01-19 ENCOUNTER — Ambulatory Visit (INDEPENDENT_AMBULATORY_CARE_PROVIDER_SITE_OTHER): Payer: Self-pay | Admitting: Audiology

## 2023-01-21 ENCOUNTER — Inpatient Hospital Stay (HOSPITAL_BASED_OUTPATIENT_CLINIC_OR_DEPARTMENT_OTHER): Payer: 59 | Admitting: Hematology and Oncology

## 2023-01-21 ENCOUNTER — Inpatient Hospital Stay: Payer: 59 | Attending: Hematology and Oncology

## 2023-01-21 VITALS — BP 131/82 | HR 64 | Temp 97.6°F | Resp 16 | Ht 67.5 in | Wt 189.1 lb

## 2023-01-21 DIAGNOSIS — D696 Thrombocytopenia, unspecified: Secondary | ICD-10-CM | POA: Insufficient documentation

## 2023-01-21 DIAGNOSIS — N289 Disorder of kidney and ureter, unspecified: Secondary | ICD-10-CM | POA: Diagnosis not present

## 2023-01-21 DIAGNOSIS — G620 Drug-induced polyneuropathy: Secondary | ICD-10-CM | POA: Diagnosis not present

## 2023-01-21 DIAGNOSIS — C9030 Solitary plasmacytoma not having achieved remission: Secondary | ICD-10-CM | POA: Diagnosis present

## 2023-01-21 DIAGNOSIS — D709 Neutropenia, unspecified: Secondary | ICD-10-CM | POA: Diagnosis not present

## 2023-01-21 DIAGNOSIS — T451X5A Adverse effect of antineoplastic and immunosuppressive drugs, initial encounter: Secondary | ICD-10-CM | POA: Diagnosis not present

## 2023-01-21 DIAGNOSIS — C9001 Multiple myeloma in remission: Secondary | ICD-10-CM

## 2023-01-21 DIAGNOSIS — C9000 Multiple myeloma not having achieved remission: Secondary | ICD-10-CM | POA: Diagnosis not present

## 2023-01-21 LAB — CBC WITH DIFFERENTIAL (CANCER CENTER ONLY)
Abs Immature Granulocytes: 0.01 10*3/uL (ref 0.00–0.07)
Basophils Absolute: 0.1 10*3/uL (ref 0.0–0.1)
Basophils Relative: 2 %
Eosinophils Absolute: 0.2 10*3/uL (ref 0.0–0.5)
Eosinophils Relative: 7 %
HCT: 40.5 % (ref 36.0–46.0)
Hemoglobin: 13.7 g/dL (ref 12.0–15.0)
Immature Granulocytes: 0 %
Lymphocytes Relative: 28 %
Lymphs Abs: 0.7 10*3/uL (ref 0.7–4.0)
MCH: 30.2 pg (ref 26.0–34.0)
MCHC: 33.8 g/dL (ref 30.0–36.0)
MCV: 89.4 fL (ref 80.0–100.0)
Monocytes Absolute: 0.3 10*3/uL (ref 0.1–1.0)
Monocytes Relative: 11 %
Neutro Abs: 1.4 10*3/uL — ABNORMAL LOW (ref 1.7–7.7)
Neutrophils Relative %: 52 %
Platelet Count: 126 10*3/uL — ABNORMAL LOW (ref 150–400)
RBC: 4.53 MIL/uL (ref 3.87–5.11)
RDW: 14.4 % (ref 11.5–15.5)
WBC Count: 2.6 10*3/uL — ABNORMAL LOW (ref 4.0–10.5)
nRBC: 0 % (ref 0.0–0.2)

## 2023-01-21 LAB — CMP (CANCER CENTER ONLY)
ALT: 12 U/L (ref 0–44)
AST: 14 U/L — ABNORMAL LOW (ref 15–41)
Albumin: 4.5 g/dL (ref 3.5–5.0)
Alkaline Phosphatase: 73 U/L (ref 38–126)
Anion gap: 6 (ref 5–15)
BUN: 28 mg/dL — ABNORMAL HIGH (ref 8–23)
CO2: 26 mmol/L (ref 22–32)
Calcium: 9.4 mg/dL (ref 8.9–10.3)
Chloride: 106 mmol/L (ref 98–111)
Creatinine: 1.73 mg/dL — ABNORMAL HIGH (ref 0.44–1.00)
GFR, Estimated: 32 mL/min — ABNORMAL LOW (ref >60)
Glucose, Bld: 105 mg/dL — ABNORMAL HIGH (ref 70–99)
Potassium: 4.5 mmol/L (ref 3.5–5.1)
Sodium: 138 mmol/L (ref 135–145)
Total Bilirubin: 0.6 mg/dL (ref 0.0–1.2)
Total Protein: 7.2 g/dL (ref 6.5–8.1)

## 2023-01-21 LAB — LACTATE DEHYDROGENASE: LDH: 139 U/L (ref 98–192)

## 2023-01-21 NOTE — Progress Notes (Signed)
 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  Serological testing concerning for a plasma cell neoplasm, with UPEP showing 7.4 g of protein daily with markedly high lambda light chains -11/07/2020: Bone marrow biopsy performed, shows no evidence of multiple myeloma -11/23/2020: Biopsy of right clavicle mass confirmed plasmacytoma.  -11/27/2020: PET scan show intensely hypermetabolic large soft tissue masses arising from the LEFT and RIGHT ribs. Two large lesions in the LEFT chest wall and a single large lesion in the RIGHT chest wall.Hypermetabolic expansile solid masses arising from the medial RIGHT clavicle and RIGHT inferior pubic ischium 01/09/2021: Cycle 1, Day 1 CyBorD 02/06/2021: Cycle 2, Day 1 CyBorD 03/06/2021: Cycle 3, Day 1 CyBorD 04/03/2021: Cycle 4, Day 1 CyBorD  05/01/2021: Cycle 5, Day 1 CyBorD  05/29/2021: Cycle 6, Day 1 CyBorD  06/26/2021: Cycle 7, Day 1 CyBorD  07/29/2021: Started maintenance Revlimid  5 mg once daily, 21 days on, 7 days off every 28 days.  10/2021: Decreased to maintenance Revlimid  2.5 mg once daily, 21 days on, 7 days off every 28 days.  04/2022: Decreased to maintenance Revlimid  2.5 mg every other day, 21 days on, 7 days off every 28 days.   Interval History:  Jordan Gregory 68 y.o. 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 The patient was last seen on 10/22/2022.  In the  interim, she has continued maintenance Revlimid  therapy.   On exam today Jordan Gregory reports she adopted 2 kittens and had a lot of guests over the holidays.  She reports that fortunately she has not had any infectious symptoms such as runny nose, sore throat, cough.  She did developed a "mild shingles outbreak" for which she reports it was "quite mild".  She reports it was a little bit tender but not painful.  She is grateful of the vaccine and made it a more mild reaction.  She reports that she still has some generalized abdominal discomfort, she attributes to the Velcade .  She reports that she has been tolerating her Revlimid  2.5 mg every other day quite well with no difficulties.  Her appetite remains strong and her energy levels are good.  She does have some occasional bouts of fatigue but reports that she has been pushing herself quite hard over the last few months.  She denies fevers, chills, night sweats, shortness of breath, chest pain or cough.  She has no other complaints.  Rest of the 10 point ROS is below.  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HCC)    Thyroid  disease    Vitamin D  deficiency     SURGICAL HISTORY: Past Surgical History:  Procedure Laterality Date   MOHS SURGERY  2016    SOCIAL HISTORY: Social History   Socioeconomic History   Marital status: Married    Spouse name: Not on file   Number of children: Not on file   Years of education: Not on file   Highest education level:  Master's degree (e.g., MA, MS, MEng, MEd, MSW, MBA)  Occupational History   Not on file  Tobacco Use   Smoking status: Never   Smokeless tobacco: Never  Vaping Use   Vaping status: Never Used  Substance and Sexual Activity   Alcohol use: Yes    Alcohol/week: 5.0 standard drinks of alcohol    Types: 5 Standard drinks or equivalent per week    Comment: regular   Drug use: No   Sexual activity: Yes    Birth  control/protection: Post-menopausal    Comment: 1st intercourse 47 yo-5 partners  Other Topics Concern   Not on file  Social History Narrative   She works at [employer] and [employer].    Married - 23 years    No kids       She likes to travel, read, cycle, be outside.    Social Drivers of Corporate investment banker Strain: Low Risk  (01/01/2023)   Overall Financial Resource Strain (CARDIA)    Difficulty of Paying Living Expenses: Not hard at all  Food Insecurity: No Food Insecurity (01/01/2023)   Hunger Vital Sign    Worried About Running Out of Food in the Last Year: Never true    Ran Out of Food in the Last Year: Never true  Transportation Needs: No Transportation Needs (01/01/2023)   PRAPARE - Administrator, Civil Service (Medical): No    Lack of Transportation (Non-Medical): No  Physical Activity: Sufficiently Active (01/01/2023)   Exercise Vital Sign    Days of Exercise per Week: 4 days    Minutes of Exercise per Session: 40 min  Stress: Stress Concern Present (01/01/2023)   [employer] of Occupational Health - Occupational Stress Questionnaire    Feeling of Stress : To some extent  Social Connections: Moderately Isolated (01/01/2023)   Social Connection and Isolation Panel [NHANES]    Frequency of Communication with Friends and Family: More than three times a week    Frequency of Social Gatherings with Friends and Family: Once a week    Attends Religious Services: Never    Database administrator or Organizations: No    Attends Engineer, structural: Not on file    Marital Status: Married  Catering manager Violence: Not At Risk (12/04/2020)   Humiliation, Afraid, Rape, and Kick questionnaire    Fear of Current or Ex-Partner: No    Emotionally Abused: No    Physically Abused: No    Sexually Abused: No    FAMILY HISTORY: Family History  Problem Relation Age of Onset   Arthritis Mother    Hypertension Mother    Dementia Mother    Heart disease  Father    Liver cancer Father    Asperger's syndrome Brother    Atrial fibrillation Brother     ALLERGIES:  is allergic to ramipril.  MEDICATIONS:  Current Outpatient Medications  Medication Sig Dispense Refill   aspirin EC 81 MG tablet Take 81 mg by mouth daily. Swallow whole.     calcium carbonate (OS-CAL) 600 MG TABS Take 600 mg by mouth daily.     citalopram  (CELEXA ) 10 MG tablet Take 1 tablet (10 mg total) by mouth daily. 90 tablet 3   lenalidomide  (REVLIMID ) 2.5 MG capsule Take 1 capsule (2.5 mg total) by mouth daily. Take 1 capsule (2.5 mg total) every other day 21 days and then 7 days off. Repeat cycle. 11 capsule 0   levothyroxine  (SYNTHROID ) 75 MCG tablet TAKE 1  TABLET BY MOUTH EVERY DAY BEFORE BREAKFAST 90 tablet 3   traZODone  (DESYREL ) 50 MG tablet TAKE 1/2 TO 1 TABLET BY MOUTH AT BEDTIME AS NEEDED FOR SLEEP 90 tablet 1   VITAMIN D , CHOLECALCIFEROL, PO Take 2,000 Int'l Units by mouth daily.     No current facility-administered medications for this visit.    REVIEW OF SYSTEMS:   Constitutional: ( - ) fevers, ( - )  chills , ( - ) night sweats Eyes: ( - ) blurriness of vision, ( - ) double vision, ( - ) watery eyes Ears, nose, mouth, throat, and face: ( - ) mucositis, ( - ) sore throat Respiratory: ( - ) cough, ( - ) dyspnea, ( - ) wheezes Cardiovascular: ( - ) palpitation, ( - ) chest discomfort, ( - ) lower extremity swelling Gastrointestinal:  ( - ) nausea, ( - ) heartburn, ( + ) change in bowel habits Skin: ( - ) abnormal skin rashes Lymphatics: ( - ) new lymphadenopathy, ( - ) easy bruising Neurological: ( + ) numbness, ( - ) tingling, ( - ) new weaknesses Behavioral/Psych: ( - ) mood change, ( - ) new changes  All other systems were reviewed with the patient and are negative.  PHYSICAL EXAMINATION: ECOG PERFORMANCE STATUS: 1 - Symptomatic but completely ambulatory  Vitals:   01/21/23 1104  BP: 131/82  Pulse: 64  Resp: 16  Temp: 97.6 F (36.4 C)  SpO2: 100%       Filed Weights   01/21/23 1104  Weight: 189 lb 1.6 oz (85.8 kg)     GENERAL: Well-appearing middle-age Caucasian female, alert, no distress and comfortable SKIN: skin color, texture, turgor are normal, no rashes or significant lesions EYES: conjunctiva are pink and non-injected, sclera clear LUNGS: clear to auscultation and percussion with normal breathing effort HEART: regular rate & rhythm and no murmurs and no lower extremity edema Musculoskeletal: no cyanosis of digits and no clubbing  PSYCH: alert & oriented x 3, fluent speech NEURO: no focal motor/sensory deficits  LABORATORY DATA:  I have reviewed the data as listed    Latest Ref Rng & Units 01/21/2023   10:45 AM 12/10/2022   10:31 AM 11/12/2022   10:41 AM  CBC  WBC 4.0 - 10.5 K/uL 2.6  2.3  2.1   Hemoglobin 12.0 - 15.0 g/dL 91.4  78.2  95.6   Hematocrit 36.0 - 46.0 % 40.5  40.7  38.8   Platelets 150 - 400 K/uL 126  145  130        Latest Ref Rng & Units 12/10/2022   10:31 AM 11/12/2022   10:41 AM 10/22/2022   10:42 AM  CMP  Glucose 70 - 99 mg/dL 96  97  93   BUN 8 - 23 mg/dL 24  33  24   Creatinine 0.44 - 1.00 mg/dL 2.13  0.86  5.78   Sodium 135 - 145 mmol/L 140  138  139   Potassium 3.5 - 5.1 mmol/L 4.4  4.4  4.2   Chloride 98 - 111 mmol/L 108  106  108   CO2 22 - 32 mmol/L 27  25  23    Calcium 8.9 - 10.3 mg/dL 9.3  9.3  8.8   Total Protein 6.5 - 8.1 g/dL 6.7  7.2  6.7   Total Bilirubin <1.2 mg/dL 0.4  0.5  0.7   Alkaline Phos 38 - 126 U/L 74  77  72   AST 15 - 41 U/L  14  15  17    ALT 0 - 44 U/L 11  13  18      Lab Results  Component Value Date   MPROTEIN Not Observed 12/10/2022   MPROTEIN Not Observed 11/12/2022   MPROTEIN Not Observed 10/22/2022   Lab Results  Component Value Date   KPAFRELGTCHN 16.4 12/10/2022   KPAFRELGTCHN 18.7 11/12/2022   KPAFRELGTCHN 16.3 10/22/2022   LAMBDASER 13.7 12/10/2022   LAMBDASER 14.7 11/12/2022   LAMBDASER 13.2 10/22/2022   KAPLAMBRATIO 1.20 12/10/2022    KAPLAMBRATIO 1.27 11/12/2022   KAPLAMBRATIO 1.23 10/22/2022     RADIOGRAPHIC STUDIES: No results found.  ASSESSMENT & PLAN Shanyla Reiley is a 68 y.o. 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  Previously we discussed the diagnosis and treatment options for plasmacytomas without multiple myeloma. This includes radiation therapy verus chemotherapy. Dr. Rosaline Coma discussed case with Dr. Wendall Halls from [facility] and the recommendation was to proceed with chemotherapy due to patients decline in renal function.  Given the patient's kidney dysfunction at this time the treatment of choice was CyBorD chemotherapy.  Patient received 7 cycles of CyBorD from 01/09/2021-06/26/2021.   Ms. Hosaka transitioned to maintenance Revlimid  5 mg PO daily starting 07/29/2021.   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   --Serological studies are confirmatory of a plasma cell neoplasm with markedly high lambda light chains and proteinuria.  --Bone marrow biopsy from 11/07/2020 showed no evidence of multiple myeloma. Right clavicle biopsy from 11/23/2020 confirmed plasma cell neoplasm consistent with plasmacytomas.  --PET scan from 11/27/2020 showed multifocal hypermetabolic soft tissue massing arising from the left and right ribs, left and right chest wall, right clavicle and right inferior pubic ischium. Largest is solid mass in the anterior left chest wall measuring 10.2 x 9.9 cm.  --Recommended to initiate systemic chemotherapy due to acute renal dysfunction.  --Received 7 cycles of CyBorD from 01/09/2021 to 06/26/2021 --Starting 07/29/2021, started maintenance therapy revlimid  5 mg PO daily 21 days on 7 days off.  Plan: --Most recent M protein was undetectable on 09/17/2022. M protein was not detected and normal serum free light chains.  --Labs today were reviewed  and showed WBC 2.6, hemoglobin 13.7, MCV 89.4, platelets 126.  ANC 1.4 --continue Revlimid  dose 2.5 mg  PO every other day, 21 days on and 7 days off.  --RTC for monthly labs and toxicity check in 3 months.   #Neutropenia/Thrombocytopenia: --Secondary to revlimid   #Renal dysfunction--improved: --Secondary to damage from malignancy.  --Creatinine 1.73 --Continue to monitor closely.   #Supportive Care -- port placement not required -- zofran  8mg  q8H PRN and compazine  10mg  PO q6H for nausea -- stool softeners/laxatives for constipation as needed  No orders of the defined types were placed in this encounter.  All questions were answered. The patient knows to call the clinic with any problems, questions or concerns.  I have spent a total of 30 minutes minutes of face-to-face and non-face-to-face time, preparing to see the patient, performing a medically appropriate examination, counseling and educating the patient, ordering medications, documenting clinical information in the electronic health record,  and care coordination.   Rogerio Clay, MD Department of Hematology/Oncology [facility] Phone: [phone] Pager: [phone] Email: [email or web address]

## 2023-01-22 ENCOUNTER — Other Ambulatory Visit: Payer: Self-pay | Admitting: Hematology and Oncology

## 2023-01-22 ENCOUNTER — Other Ambulatory Visit: Payer: Self-pay | Admitting: *Deleted

## 2023-01-22 ENCOUNTER — Telehealth: Payer: Self-pay | Admitting: Hematology and Oncology

## 2023-01-22 DIAGNOSIS — C9000 Multiple myeloma not having achieved remission: Secondary | ICD-10-CM

## 2023-01-22 LAB — KAPPA/LAMBDA LIGHT CHAINS
Kappa free light chain: 19.9 mg/L — ABNORMAL HIGH (ref 3.3–19.4)
Kappa, lambda light chain ratio: 1.38 (ref 0.26–1.65)
Lambda free light chains: 14.4 mg/L (ref 5.7–26.3)

## 2023-01-22 MED ORDER — LENALIDOMIDE 2.5 MG PO CAPS: 2.5000 mg | ORAL_CAPSULE | Freq: Every day | ORAL | 0 refills | Status: DC

## 2023-01-22 NOTE — Telephone Encounter (Signed)
Scheduled appointments per 1/15 [place] notes. Patient is aware of the made appointments and will be mailed an appointment reminder.

## 2023-01-23 LAB — BETA 2 MICROGLOBULIN, SERUM: Beta-2 Microglobulin: 2.8 mg/L — ABNORMAL HIGH (ref 0.6–2.4)

## 2023-01-29 LAB — MULTIPLE MYELOMA PANEL, SERUM
Albumin SerPl Elph-Mcnc: 3.9 g/dL (ref 2.9–4.4)
Albumin/Glob SerPl: 1.4 (ref 0.7–1.7)
Alpha 1: 0.2 g/dL (ref 0.0–0.4)
Alpha2 Glob SerPl Elph-Mcnc: 0.7 g/dL (ref 0.4–1.0)
B-Globulin SerPl Elph-Mcnc: 1 g/dL (ref 0.7–1.3)
Gamma Glob SerPl Elph-Mcnc: 0.9 g/dL (ref 0.4–1.8)
Globulin, Total: 2.8 g/dL (ref 2.2–3.9)
IgA: 45 mg/dL — ABNORMAL LOW (ref 87–352)
IgG (Immunoglobin G), Serum: 904 mg/dL (ref 586–1602)
IgM (Immunoglobulin M), Srm: 36 mg/dL (ref 26–217)
Total Protein ELP: 6.7 g/dL (ref 6.0–8.5)

## 2023-02-12 LAB — LAB REPORT - SCANNED
Albumin, Urine POC: 4.8
Microalb Creat Ratio: 5

## 2023-02-16 ENCOUNTER — Ambulatory Visit (INDEPENDENT_AMBULATORY_CARE_PROVIDER_SITE_OTHER): Payer: 59

## 2023-02-16 ENCOUNTER — Ambulatory Visit (INDEPENDENT_AMBULATORY_CARE_PROVIDER_SITE_OTHER): Payer: 59 | Admitting: Audiology

## 2023-02-16 DIAGNOSIS — H903 Sensorineural hearing loss, bilateral: Secondary | ICD-10-CM

## 2023-02-16 NOTE — Progress Notes (Signed)
Dear Dr. Evelene Croon, Here is my assessment for our mutual patient, Jordan Gregory. Thank you for allowing me the opportunity to care for your patient. Please do not hesitate to contact me should you have any other questions. Sincerely, Burna Forts PA-C  Otolaryngology Clinic Note Referring provider: Dr. Evelene Croon HPI:  Jordan Gregory is a 68 y.o. female kindly referred by Dr. Evelene Croon   The patient present today for evaluation of hearing loss. She Notes recently she has been more aware of some minimal hearing loss.  She notes that she has to use turn her TV up slightly louder than usual.  She does find herself asking people to repeat themselves.  She notes the loss of hearing is very minimal and does not affect her quality of life for normal interactions.  She denies any abrupt onset of symptoms. The symptoms started gradually with no acute onset. She notes that both ears are equal. She denies any history of head or neck surgery or trauma. She has no known noise exposure. She has no pain or infectious history. She occasionally has some ringing in both ears, not bothersome, worse at night when quiet. She has some occasional balance issues since her chemotherapy treatment, no associated dizziness or persistence. Her past medical history is signigicant for multiple myeloma in remission SP 7 cuces of CyBorD in 2023 followed by maintenance Revlimid. No previous hearing evaluation.   H&N Surgery: no Personal or FHx of bleeding dz or anesthesia difficulty: no   GLP-1: no AP/AC: 81 daily  Tobacco: no. Alcohol:1 drink daily . Occupation: retired. Lives at home with husband   Independent Review of Additional Tests or Records:  02/16/2022 Audiogram was independently reviewed and interpreted by me and it reveals Right ear: normal to moderate sensorineural hearing loss; 92% word interpretation at 65dB; type A tympanogram Left ear: normal to moderate sensorineural hearing loss; 96% word interpretation at  65dB; type A tympanogram  SNHL= Sensorineural hearing loss    PMH/Meds/All/SocHx/FamHx/ROS:   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HCC)    Thyroid disease    Vitamin D deficiency      Past Surgical History:  Procedure Laterality Date   MOHS SURGERY  2016    Family History  Problem Relation Age of Onset   Arthritis Mother    Hypertension Mother    Dementia Mother    Heart disease Father    Liver cancer Father    Asperger's syndrome Brother    Atrial fibrillation Brother      Social Connections: Moderately Isolated (01/01/2023)   Social Connection and Isolation Panel [NHANES]    Frequency of Communication with Friends and Family: More than three times a week    Frequency of Social Gatherings with Friends and Family: Once a week    Attends Religious Services: Never    Database administrator or Organizations: No    Attends Engineer, structural: Not on file    Marital Status: Married      Current Outpatient Medications:    aspirin EC 81 MG tablet, Take 81 mg by mouth daily. Swallow whole., Disp: , Rfl:    calcium carbonate (OS-CAL) 600 MG TABS, Take 600 mg by mouth daily., Disp: , Rfl:    citalopram (CELEXA) 10 MG tablet, Take 1 tablet (10 mg total) by mouth daily., Disp: 90 tablet, Rfl: 3   lenalidomide (REVLIMID) 2.5 MG capsule, Take 1 capsule (2.5 mg total) by mouth  daily. Take 1 capsule  every other day for 21 days then 7 days off, Disp: 11 capsule, Rfl: 0   levothyroxine (SYNTHROID) 75 MCG tablet, TAKE 1 TABLET BY MOUTH EVERY DAY BEFORE BREAKFAST, Disp: 90 tablet, Rfl: 3   traZODone (DESYREL) 50 MG tablet, TAKE 1/2 TO 1 TABLET BY MOUTH AT BEDTIME AS NEEDED FOR SLEEP, Disp: 90 tablet, Rfl: 1   VITAMIN D, CHOLECALCIFEROL, PO, Take 2,000 Int'l Units by mouth daily., Disp: , Rfl:    Physical Exam:   LMP 03/04/2011   Pertinent Findings   CN II-XII intact Bilateral EAC clear and TM intact with well pneumatized  middle ear spaces Weber 512: equal Rinne 512: AC > BC b/l  Anterior rhinoscopy: Septum midline; bilateral inferior turbinates with no hypertrophy No lesions of oral cavity/oropharynx; dentition wnl, no visible tonsils  No obviously palpable neck masses/lymphadenopathy/thyromegaly No respiratory distress or stridor  Seprately Identifiable Procedures:  None  Impression & Plans:  [name] is a 68 y.o. female with the following   Hearing loss-  Ms. Pifer presents with bilateral normal to moderate sensorineural hearing loss. This does not seem to be affecting her life significantly at this time. I did offer for her to see audiology for potential hearing aid eval, she would like to wait and see how the next year goes. I will happily see her back in one year for repeat hearing evaluation and further discussion.    - f/u 1 year, sooner as needed.    Thank you for allowing me the opportunity to care for your patient. Please do not hesitate to contact me should you have any other questions.  Sincerely, Burna Forts PA-C [facility] Phone: [phone] Fax: [phone]  02/16/2023, 8:59 AM

## 2023-02-16 NOTE — Progress Notes (Signed)
  [address], [address] [place], [place] [address] [phone]  Audiological Evaluation    Name: Jordan Gregory     DOB:   1955/04/03      MRN:   [id number]                                                                                     Service Date: 02/16/2023     Accompanied by: unaccompanied    Patient comes today after Lorane Rocker, PA-C sent a referral for a hearing evaluation due to concerns with hearing loss.   Symptoms Yes Details  Hearing loss  [x]  Sometimes struggles to understand what others say and may be turning the TV louder.  Tinnitus  [x]  Both ears, comes and goes  Ear pain/ infections/pressure  []  Not today  Balance problems  [x]  Reports to be off balance while walking ( like a sway) with onset after chemotherapy treatments.  Noise exposure history  [x]  [employer] in the past  Previous ear surgeries  []    Family history of hearing loss  [x]  Father and paternal grandfather with age  Amplification  []    Other  []      Otoscopy: Right ear: Clear external ear canals and notable landmarks visualized on the tympanic membrane. Left ear:  Clear external ear canals and notable landmarks visualized on the tympanic membrane.  Tympanometry: Right ear: Type A- Normal external ear canal volume with normal middle ear pressure and tympanic membrane compliance Left ear: Type A- Normal external ear canal volume with normal middle ear pressure and tympanic membrane compliance  Distortion Product Otoacoustic Emissions: Equipment not available.  Pure tone Audiometry: Right ear- Normal to moderate sensorineural hearing loss from 250 Hz - 8000 Hz. Left ear-   Normal to moderate sensorineural hearing loss from 250 Hz - 8000 Hz.  Speech Audiometry: Right ear- Speech Reception Threshold (SRT) was obtained at 25 dBHL. Left ear-Speech Reception Threshold (SRT) was obtained at 25 dBHL.   Word Recognition Score Tested using NU-6 (MLV) Right ear: 92% was obtained  at a presentation level of 65 dBHL with contralateral masking which is deemed as  excellent. Left ear: 96% was obtained at a presentation level of 65 dBHL with contralateral masking which is deemed as  excellent.   The hearing test results were completed under headphones and results are deemed to be of good reliability. Test technique:  conventional     Recommendations: Follow up with ENT as scheduled for today. Return for a hearing evaluation if concerns with hearing changes arise or per MD recommendation. Consider a communication needs assessment after medical clearance for hearing aids is obtained.   Azha Constantin MARIE LEROUX-MARTINEZ, AUD

## 2023-02-17 ENCOUNTER — Encounter (INDEPENDENT_AMBULATORY_CARE_PROVIDER_SITE_OTHER): Payer: Self-pay | Admitting: Physician Assistant

## 2023-02-18 ENCOUNTER — Inpatient Hospital Stay: Payer: 59 | Attending: Hematology and Oncology

## 2023-02-18 ENCOUNTER — Other Ambulatory Visit: Payer: Self-pay | Admitting: Hematology and Oncology

## 2023-02-18 DIAGNOSIS — C9001 Multiple myeloma in remission: Secondary | ICD-10-CM

## 2023-02-18 DIAGNOSIS — C9030 Solitary plasmacytoma not having achieved remission: Secondary | ICD-10-CM | POA: Insufficient documentation

## 2023-02-18 LAB — CBC WITH DIFFERENTIAL (CANCER CENTER ONLY)
Abs Immature Granulocytes: 0.01 10*3/uL (ref 0.00–0.07)
Basophils Absolute: 0 10*3/uL (ref 0.0–0.1)
Basophils Relative: 2 %
Eosinophils Absolute: 0.2 10*3/uL (ref 0.0–0.5)
Eosinophils Relative: 8 %
HCT: 39.8 % (ref 36.0–46.0)
Hemoglobin: 13.5 g/dL (ref 12.0–15.0)
Immature Granulocytes: 0 %
Lymphocytes Relative: 25 %
Lymphs Abs: 0.7 10*3/uL (ref 0.7–4.0)
MCH: 30.3 pg (ref 26.0–34.0)
MCHC: 33.9 g/dL (ref 30.0–36.0)
MCV: 89.2 fL (ref 80.0–100.0)
Monocytes Absolute: 0.3 10*3/uL (ref 0.1–1.0)
Monocytes Relative: 10 %
Neutro Abs: 1.5 10*3/uL — ABNORMAL LOW (ref 1.7–7.7)
Neutrophils Relative %: 55 %
Platelet Count: 122 10*3/uL — ABNORMAL LOW (ref 150–400)
RBC: 4.46 MIL/uL (ref 3.87–5.11)
RDW: 13.6 % (ref 11.5–15.5)
WBC Count: 2.7 10*3/uL — ABNORMAL LOW (ref 4.0–10.5)
nRBC: 0 % (ref 0.0–0.2)

## 2023-02-18 LAB — CMP (CANCER CENTER ONLY)
ALT: 10 U/L (ref 0–44)
AST: 11 U/L — ABNORMAL LOW (ref 15–41)
Albumin: 4.4 g/dL (ref 3.5–5.0)
Alkaline Phosphatase: 72 U/L (ref 38–126)
Anion gap: 6 (ref 5–15)
BUN: 29 mg/dL — ABNORMAL HIGH (ref 8–23)
CO2: 25 mmol/L (ref 22–32)
Calcium: 9.2 mg/dL (ref 8.9–10.3)
Chloride: 108 mmol/L (ref 98–111)
Creatinine: 1.89 mg/dL — ABNORMAL HIGH (ref 0.44–1.00)
GFR, Estimated: 29 mL/min — ABNORMAL LOW (ref >60)
Glucose, Bld: 98 mg/dL (ref 70–99)
Potassium: 4.4 mmol/L (ref 3.5–5.1)
Sodium: 139 mmol/L (ref 135–145)
Total Bilirubin: 0.4 mg/dL (ref 0.0–1.2)
Total Protein: 7 g/dL (ref 6.5–8.1)

## 2023-02-18 LAB — LACTATE DEHYDROGENASE: LDH: 137 U/L (ref 98–192)

## 2023-02-19 ENCOUNTER — Other Ambulatory Visit: Payer: Self-pay | Admitting: Hematology and Oncology

## 2023-02-19 DIAGNOSIS — C9000 Multiple myeloma not having achieved remission: Secondary | ICD-10-CM

## 2023-02-19 LAB — KAPPA/LAMBDA LIGHT CHAINS
Kappa free light chain: 20.2 mg/L — ABNORMAL HIGH (ref 3.3–19.4)
Kappa, lambda light chain ratio: 1.35 (ref 0.26–1.65)
Lambda free light chains: 15 mg/L (ref 5.7–26.3)

## 2023-02-20 ENCOUNTER — Other Ambulatory Visit: Payer: Self-pay | Admitting: *Deleted

## 2023-02-20 DIAGNOSIS — C9000 Multiple myeloma not having achieved remission: Secondary | ICD-10-CM

## 2023-02-20 MED ORDER — LENALIDOMIDE 2.5 MG PO CAPS: 2.5000 mg | ORAL_CAPSULE | Freq: Every day | ORAL | 0 refills | Status: DC

## 2023-02-22 LAB — MULTIPLE MYELOMA PANEL, SERUM
Albumin SerPl Elph-Mcnc: 3.7 g/dL (ref 2.9–4.4)
Albumin/Glob SerPl: 1.5 (ref 0.7–1.7)
Alpha 1: 0.2 g/dL (ref 0.0–0.4)
Alpha2 Glob SerPl Elph-Mcnc: 0.7 g/dL (ref 0.4–1.0)
B-Globulin SerPl Elph-Mcnc: 0.9 g/dL (ref 0.7–1.3)
Gamma Glob SerPl Elph-Mcnc: 0.8 g/dL (ref 0.4–1.8)
Globulin, Total: 2.6 g/dL (ref 2.2–3.9)
IgA: 39 mg/dL — ABNORMAL LOW (ref 87–352)
IgG (Immunoglobin G), Serum: 877 mg/dL (ref 586–1602)
IgM (Immunoglobulin M), Srm: 32 mg/dL (ref 26–217)
Total Protein ELP: 6.3 g/dL (ref 6.0–8.5)

## 2023-02-23 ENCOUNTER — Ambulatory Visit (INDEPENDENT_AMBULATORY_CARE_PROVIDER_SITE_OTHER): Payer: Self-pay | Admitting: Audiology

## 2023-02-23 ENCOUNTER — Institutional Professional Consult (permissible substitution) (INDEPENDENT_AMBULATORY_CARE_PROVIDER_SITE_OTHER): Payer: Self-pay

## 2023-03-10 ENCOUNTER — Other Ambulatory Visit: Payer: Self-pay | Admitting: Adult Health

## 2023-03-13 ENCOUNTER — Telehealth: Payer: Self-pay

## 2023-03-13 NOTE — Telephone Encounter (Signed)
 Copied from CRM [id number]. Topic: Clinical - Prescription Issue >> Mar 13, 2023 11:34 AM Elizebeth Brooking wrote: Reason for CRM: Patient called in stated that pharmacy has not been able to get in touch with anyone regarding her prescription levothyroxine (SYNTHROID) 75 MCG tablet , Is requesting a update regarding this

## 2023-03-13 NOTE — Telephone Encounter (Signed)
 Tried to [facility]. Medication was filled today.

## 2023-03-17 ENCOUNTER — Other Ambulatory Visit: Payer: Self-pay | Admitting: Hematology and Oncology

## 2023-03-17 DIAGNOSIS — C9000 Multiple myeloma not having achieved remission: Secondary | ICD-10-CM

## 2023-03-18 ENCOUNTER — Other Ambulatory Visit: Payer: Self-pay | Admitting: Hematology and Oncology

## 2023-03-18 ENCOUNTER — Inpatient Hospital Stay: Payer: 59 | Attending: Hematology and Oncology

## 2023-03-18 DIAGNOSIS — C9030 Solitary plasmacytoma not having achieved remission: Secondary | ICD-10-CM | POA: Diagnosis present

## 2023-03-18 DIAGNOSIS — C9001 Multiple myeloma in remission: Secondary | ICD-10-CM

## 2023-03-18 LAB — CMP (CANCER CENTER ONLY)
ALT: 12 U/L (ref 0–44)
AST: 13 U/L — ABNORMAL LOW (ref 15–41)
Albumin: 4.4 g/dL (ref 3.5–5.0)
Alkaline Phosphatase: 71 U/L (ref 38–126)
Anion gap: 7 (ref 5–15)
BUN: 21 mg/dL (ref 8–23)
CO2: 24 mmol/L (ref 22–32)
Calcium: 8.7 mg/dL — ABNORMAL LOW (ref 8.9–10.3)
Chloride: 107 mmol/L (ref 98–111)
Creatinine: 1.81 mg/dL — ABNORMAL HIGH (ref 0.44–1.00)
GFR, Estimated: 30 mL/min — ABNORMAL LOW (ref >60)
Glucose, Bld: 114 mg/dL — ABNORMAL HIGH (ref 70–99)
Potassium: 4.2 mmol/L (ref 3.5–5.1)
Sodium: 138 mmol/L (ref 135–145)
Total Bilirubin: 0.5 mg/dL (ref 0.0–1.2)
Total Protein: 6.8 g/dL (ref 6.5–8.1)

## 2023-03-18 LAB — CBC WITH DIFFERENTIAL (CANCER CENTER ONLY)
Abs Immature Granulocytes: 0.01 10*3/uL (ref 0.00–0.07)
Basophils Absolute: 0.1 10*3/uL (ref 0.0–0.1)
Basophils Relative: 2 %
Eosinophils Absolute: 0.2 10*3/uL (ref 0.0–0.5)
Eosinophils Relative: 6 %
HCT: 39.6 % (ref 36.0–46.0)
Hemoglobin: 13.6 g/dL (ref 12.0–15.0)
Immature Granulocytes: 0 %
Lymphocytes Relative: 25 %
Lymphs Abs: 0.6 10*3/uL — ABNORMAL LOW (ref 0.7–4.0)
MCH: 31 pg (ref 26.0–34.0)
MCHC: 34.3 g/dL (ref 30.0–36.0)
MCV: 90.2 fL (ref 80.0–100.0)
Monocytes Absolute: 0.2 10*3/uL (ref 0.1–1.0)
Monocytes Relative: 10 %
Neutro Abs: 1.3 10*3/uL — ABNORMAL LOW (ref 1.7–7.7)
Neutrophils Relative %: 57 %
Platelet Count: 115 10*3/uL — ABNORMAL LOW (ref 150–400)
RBC: 4.39 MIL/uL (ref 3.87–5.11)
RDW: 14.2 % (ref 11.5–15.5)
WBC Count: 2.3 10*3/uL — ABNORMAL LOW (ref 4.0–10.5)
nRBC: 0 % (ref 0.0–0.2)

## 2023-03-18 LAB — LACTATE DEHYDROGENASE: LDH: 144 U/L (ref 98–192)

## 2023-03-19 LAB — KAPPA/LAMBDA LIGHT CHAINS
Kappa free light chain: 19.8 mg/L — ABNORMAL HIGH (ref 3.3–19.4)
Kappa, lambda light chain ratio: 1.23 (ref 0.26–1.65)
Lambda free light chains: 16.1 mg/L (ref 5.7–26.3)

## 2023-03-20 ENCOUNTER — Telehealth: Payer: Self-pay

## 2023-03-20 LAB — MULTIPLE MYELOMA PANEL, SERUM
Albumin SerPl Elph-Mcnc: 3.7 g/dL (ref 2.9–4.4)
Albumin/Glob SerPl: 1.5 (ref 0.7–1.7)
Alpha 1: 0.2 g/dL (ref 0.0–0.4)
Alpha2 Glob SerPl Elph-Mcnc: 0.6 g/dL (ref 0.4–1.0)
B-Globulin SerPl Elph-Mcnc: 0.9 g/dL (ref 0.7–1.3)
Gamma Glob SerPl Elph-Mcnc: 0.9 g/dL (ref 0.4–1.8)
Globulin, Total: 2.6 g/dL (ref 2.2–3.9)
IgA: 42 mg/dL — ABNORMAL LOW (ref 87–352)
IgG (Immunoglobin G), Serum: 921 mg/dL (ref 586–1602)
IgM (Immunoglobulin M), Srm: 35 mg/dL (ref 26–217)
Total Protein ELP: 6.3 g/dL (ref 6.0–8.5)

## 2023-03-20 NOTE — Telephone Encounter (Signed)
 Copied from CRM [id number]. Topic: Clinical - Medication Question >> Mar 20, 2023 12:02 PM Almira Coaster wrote: Reason for CRM: Patient is calling to follow up on her Levothyroxine prescription. Advised that it was sent out on 03/12/2023. Patient called the pharmacy and was told they have not received anything to contact our office. Patient would like to know if we can call and give a verbal order.

## 2023-03-20 NOTE — Telephone Encounter (Signed)
 Called pt to advised that pharmacy is getting the Rx ready now. Pt verbalized understanding.

## 2023-04-02 ENCOUNTER — Other Ambulatory Visit (HOSPITAL_COMMUNITY)
Admission: RE | Admit: 2023-04-02 | Discharge: 2023-04-02 | Disposition: A | Discharge status: Home / Self Care | Source: Ambulatory Visit | Attending: Obstetrics and Gynecology | Admitting: Obstetrics and Gynecology

## 2023-04-02 ENCOUNTER — Ambulatory Visit (INDEPENDENT_AMBULATORY_CARE_PROVIDER_SITE_OTHER): Payer: 59 | Admitting: Obstetrics and Gynecology

## 2023-04-02 ENCOUNTER — Encounter: Payer: Self-pay | Admitting: Obstetrics and Gynecology

## 2023-04-02 VITALS — BP 108/68 | HR 73 | Ht 66.75 in | Wt 191.0 lb

## 2023-04-02 DIAGNOSIS — Z1331 Encounter for screening for depression: Secondary | ICD-10-CM | POA: Diagnosis not present

## 2023-04-02 DIAGNOSIS — Z1231 Encounter for screening mammogram for malignant neoplasm of breast: Secondary | ICD-10-CM

## 2023-04-02 DIAGNOSIS — Z1211 Encounter for screening for malignant neoplasm of colon: Secondary | ICD-10-CM

## 2023-04-02 DIAGNOSIS — N952 Postmenopausal atrophic vaginitis: Secondary | ICD-10-CM | POA: Diagnosis not present

## 2023-04-02 DIAGNOSIS — E2839 Other primary ovarian failure: Secondary | ICD-10-CM

## 2023-04-02 DIAGNOSIS — Z01419 Encounter for gynecological examination (general) (routine) without abnormal findings: Secondary | ICD-10-CM | POA: Insufficient documentation

## 2023-04-02 NOTE — Addendum Note (Signed)
 Addended by: Earley Favor on: 04/02/2023 04:23 PM   Modules accepted: Orders

## 2023-04-02 NOTE — Progress Notes (Signed)
 68 y.o. y.o. female here for annual exam. Patient's last menstrual period was 03/04/2011.    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  Patient also with increased creatinine MMG Birads 1 neg 10/14/21  Has fibroids 11/11/2010 colonoscopy. Referral placed for another one Patient had an endometrial biopsy on the last office visit August 14 with the following results: Dxa referral placed H/o PMB with EMB: Diagnosis Endometrium, biopsy - ATROPHIC APPEARING ENDOMETRIUM. - THERE IS NO EVIDENCE OF HYPERPLASIA OR MALIGNANCY.  The patient underwent a sonohysterogram today with the following findings: Uterus measures 6.9 x 5.7 x 3.2 cm endometrial stripe of 3.2 mm. One small left pedunculated fibroid measuring 3.9 x 2.6 mm was noted and 2 other small ones largest one measuring 15 x 11 mm. Both ovaries appeared to be normal. After normal saline was instilled into the uterine cavity there was no evidence of any intrauterine defect noted. Body mass index is 30.14 kg/m.     04/02/2023    3:21 PM 01/02/2023   10:35 AM 12/19/2021   11:05 AM  Depression screen PHQ 2/9  Decreased Interest 0 0 0  Down, Depressed, Hopeless 1 0 0  PHQ - 2 Score 1 0 0  Altered sleeping  1   Tired, decreased energy  1   Change in appetite  0   Feeling bad or failure about yourself   0   Trouble concentrating  0   Moving slowly or fidgety/restless  0   Suicidal thoughts  0   PHQ-9 Score  2   Difficult doing work/chores  Not difficult at all     Height 5' 6.75" (1.695 m), weight 191 lb (86.6 kg), last menstrual period 03/04/2011.  No results found for: "DIAGPAP", "HPVHIGH", "ADEQPAP"  GYN HISTORY: No results found for: "DIAGPAP", "HPVHIGH", "ADEQPAP"  OB History  Gravida Para Term Preterm AB Living  0       SAB IAB Ectopic Multiple Live Births          Past Medical History:  Diagnosis Date   Acute renal disease    Hypertension    Insomnia    Multiple myeloma  (HCC)    Plasmacytoma (HCC)    Thyroid disease    Vitamin D deficiency     Past Surgical History:  Procedure Laterality Date   MOHS SURGERY  2016    Current Outpatient Medications on File Prior to Visit  Medication Sig Dispense Refill   aspirin EC 81 MG tablet Take 81 mg by mouth daily. Swallow whole.     calcium carbonate (OS-CAL) 600 MG TABS Take 600 mg by mouth daily.     citalopram (CELEXA) 10 MG tablet Take 1 tablet (10 mg total) by mouth daily. 90 tablet 3   lenalidomide (REVLIMID) 2.5 MG capsule TAKE 1 CAPSULE BY MOUTH EVERY OTHER DAY FOR 21 DAYS ON THEN 7 DAYS OFF 11 capsule 0   levothyroxine (SYNTHROID) 75 MCG tablet TAKE 1 TABLET BY MOUTH EVERY DAY BEFORE BREAKFAST 90 tablet 3   traZODone (DESYREL) 50 MG tablet TAKE 1/2 TO 1 TABLET BY MOUTH AT BEDTIME AS NEEDED FOR SLEEP 90 tablet 1   VITAMIN D, CHOLECALCIFEROL, PO Take 2,000 Int'l Units by mouth daily.     No current facility-administered medications on file prior to visit.    Social History   Socioeconomic History   Marital status: Married    Spouse name: Not on file   Number of children: Not on file  Years of education: Not on file   Highest education level: Master's degree (e.g., MA, MS, MEng, MEd, MSW, MBA)  Occupational History   Not on file  Tobacco Use   Smoking status: Never   Smokeless tobacco: Never  Vaping Use   Vaping status: Never Used  Substance and Sexual Activity   Alcohol use: Yes    Alcohol/week: 5.0 standard drinks of alcohol    Types: 5 Standard drinks or equivalent per week   Drug use: No   Sexual activity: Not Currently    Partners: Male    Birth control/protection: Post-menopausal    Comment: 1st intercourse 2 yo-5 partners  Other Topics Concern   Not on file  Social History Narrative   She works at [employer] and [employer].    Married - 23 years    No kids       She likes to travel, read, cycle, be outside.    Social Drivers of Corporate investment banker Strain: Low Risk   (01/01/2023)   Overall Financial Resource Strain (CARDIA)    Difficulty of Paying Living Expenses: Not hard at all  Food Insecurity: No Food Insecurity (01/01/2023)   Hunger Vital Sign    Worried About Running Out of Food in the Last Year: Never true    Ran Out of Food in the Last Year: Never true  Transportation Needs: No Transportation Needs (01/01/2023)   PRAPARE - Administrator, Civil Service (Medical): No    Lack of Transportation (Non-Medical): No  Physical Activity: Sufficiently Active (01/01/2023)   Exercise Vital Sign    Days of Exercise per Week: 4 days    Minutes of Exercise per Session: 40 min  Stress: Stress Concern Present (01/01/2023)   [employer] of Occupational Health - Occupational Stress Questionnaire    Feeling of Stress : To some extent  Social Connections: Moderately Isolated (01/01/2023)   Social Connection and Isolation Panel [NHANES]    Frequency of Communication with Friends and Family: More than three times a week    Frequency of Social Gatherings with Friends and Family: Once a week    Attends Religious Services: Never    Database administrator or Organizations: No    Attends Engineer, structural: Not on file    Marital Status: Married  Catering manager Violence: Not At Risk (12/04/2020)   Humiliation, Afraid, Rape, and Kick questionnaire    Fear of Current or Ex-Partner: No    Emotionally Abused: No    Physically Abused: No    Sexually Abused: No    Family History  Problem Relation Age of Onset   Arthritis Mother    Hypertension Mother    Dementia Mother    Heart disease Father    Liver cancer Father    Asperger's syndrome Brother    Atrial fibrillation Brother      Allergies  Allergen Reactions   Ramipril Cough   Velcade [Bortezomib]     Nerve pain      Patient's last menstrual period was Patient's last menstrual period was 03/04/2011.Marland Kitchen             Review of Systems Alls systems reviewed and are  negative.     Physical Exam Constitutional:      Appearance: Normal appearance.  Genitourinary:     Vulva and urethral meatus normal.     No lesions in the vagina.     Right Labia: No rash, lesions or skin changes.  Left Labia: No lesions, skin changes or rash.    No vaginal discharge or tenderness.     No vaginal prolapse present.    Moderate vaginal atrophy present.     Right Adnexa: not tender, not palpable and no mass present.    Left Adnexa: not tender, not palpable and no mass present.    No cervical motion tenderness or discharge.     Uterus is enlarged and irregular.     Uterus is not tender.  Breasts:    Right: Normal.     Left: Normal.  HENT:     Head: Normocephalic.  Neck:     Thyroid: No thyroid mass, thyromegaly or thyroid tenderness.  Cardiovascular:     Rate and Rhythm: Normal rate and regular rhythm.     Heart sounds: Normal heart sounds, S1 normal and S2 normal.  Pulmonary:     Effort: Pulmonary effort is normal.     Breath sounds: Normal breath sounds and air entry.  Abdominal:     General: There is no distension.     Palpations: Abdomen is soft. There is no mass.     Tenderness: There is no abdominal tenderness. There is no guarding or rebound.  Musculoskeletal:        General: Normal range of motion.     Cervical back: Full passive range of motion without pain, normal range of motion and neck supple. No tenderness.     Right lower leg: No edema.     Left lower leg: No edema.  Neurological:     Mental Status: She is alert.  Skin:    General: Skin is warm.  Psychiatric:        Mood and Affect: Mood normal.        Behavior: Behavior normal.        Thought Content: Thought content normal.  Vitals and nursing note reviewed. Exam conducted with a chaperone present.       A:         Well Woman GYN exam                             P:        Pap smear collected today Encouraged annual mammogram screening Colon cancer screening referral placed  today DXA ordered today Labs and immunizations to do with PMD Encouraged healthy lifestyle practices Encouraged Vit D and Calcium   No follow-ups on file.  Earley Favor

## 2023-04-03 LAB — CYTOLOGY - PAP
Adequacy: ABSENT
Diagnosis: NEGATIVE

## 2023-04-06 ENCOUNTER — Encounter: Payer: Self-pay | Admitting: Obstetrics and Gynecology

## 2023-04-15 ENCOUNTER — Other Ambulatory Visit: Payer: Self-pay | Admitting: Hematology and Oncology

## 2023-04-15 ENCOUNTER — Inpatient Hospital Stay (HOSPITAL_BASED_OUTPATIENT_CLINIC_OR_DEPARTMENT_OTHER): Payer: 59 | Admitting: Hematology and Oncology

## 2023-04-15 ENCOUNTER — Inpatient Hospital Stay: Payer: 59 | Attending: Hematology and Oncology

## 2023-04-15 VITALS — BP 144/78 | HR 65 | Temp 97.6°F | Resp 13 | Wt 189.0 lb

## 2023-04-15 DIAGNOSIS — C9030 Solitary plasmacytoma not having achieved remission: Secondary | ICD-10-CM | POA: Diagnosis present

## 2023-04-15 DIAGNOSIS — D696 Thrombocytopenia, unspecified: Secondary | ICD-10-CM | POA: Diagnosis not present

## 2023-04-15 DIAGNOSIS — N289 Disorder of kidney and ureter, unspecified: Secondary | ICD-10-CM | POA: Insufficient documentation

## 2023-04-15 DIAGNOSIS — D709 Neutropenia, unspecified: Secondary | ICD-10-CM | POA: Insufficient documentation

## 2023-04-15 DIAGNOSIS — C9001 Multiple myeloma in remission: Secondary | ICD-10-CM

## 2023-04-15 DIAGNOSIS — C9000 Multiple myeloma not having achieved remission: Secondary | ICD-10-CM

## 2023-04-15 DIAGNOSIS — D649 Anemia, unspecified: Secondary | ICD-10-CM

## 2023-04-15 DIAGNOSIS — D6959 Other secondary thrombocytopenia: Secondary | ICD-10-CM | POA: Diagnosis not present

## 2023-04-15 LAB — CBC WITH DIFFERENTIAL (CANCER CENTER ONLY)
Abs Immature Granulocytes: 0.01 10*3/uL (ref 0.00–0.07)
Basophils Absolute: 0.1 10*3/uL (ref 0.0–0.1)
Basophils Relative: 2 %
Eosinophils Absolute: 0.2 10*3/uL (ref 0.0–0.5)
Eosinophils Relative: 8 %
HCT: 40.6 % (ref 36.0–46.0)
Hemoglobin: 13.8 g/dL (ref 12.0–15.0)
Immature Granulocytes: 0 %
Lymphocytes Relative: 22 %
Lymphs Abs: 0.6 10*3/uL — ABNORMAL LOW (ref 0.7–4.0)
MCH: 30.3 pg (ref 26.0–34.0)
MCHC: 34 g/dL (ref 30.0–36.0)
MCV: 89 fL (ref 80.0–100.0)
Monocytes Absolute: 0.3 10*3/uL (ref 0.1–1.0)
Monocytes Relative: 10 %
Neutro Abs: 1.5 10*3/uL — ABNORMAL LOW (ref 1.7–7.7)
Neutrophils Relative %: 58 %
Platelet Count: 120 10*3/uL — ABNORMAL LOW (ref 150–400)
RBC: 4.56 MIL/uL (ref 3.87–5.11)
RDW: 13.8 % (ref 11.5–15.5)
WBC Count: 2.6 10*3/uL — ABNORMAL LOW (ref 4.0–10.5)
nRBC: 0 % (ref 0.0–0.2)

## 2023-04-15 LAB — CMP (CANCER CENTER ONLY)
ALT: 11 U/L (ref 0–44)
AST: 13 U/L — ABNORMAL LOW (ref 15–41)
Albumin: 4.6 g/dL (ref 3.5–5.0)
Alkaline Phosphatase: 71 U/L (ref 38–126)
Anion gap: 9 (ref 5–15)
BUN: 21 mg/dL (ref 8–23)
CO2: 25 mmol/L (ref 22–32)
Calcium: 9.2 mg/dL (ref 8.9–10.3)
Chloride: 106 mmol/L (ref 98–111)
Creatinine: 1.65 mg/dL — ABNORMAL HIGH (ref 0.44–1.00)
GFR, Estimated: 34 mL/min — ABNORMAL LOW (ref >60)
Glucose, Bld: 114 mg/dL — ABNORMAL HIGH (ref 70–99)
Potassium: 4.3 mmol/L (ref 3.5–5.1)
Sodium: 140 mmol/L (ref 135–145)
Total Bilirubin: 0.6 mg/dL (ref 0.0–1.2)
Total Protein: 7.4 g/dL (ref 6.5–8.1)

## 2023-04-15 LAB — LACTATE DEHYDROGENASE: LDH: 138 U/L (ref 98–192)

## 2023-04-15 NOTE — Progress Notes (Signed)
 [facility] [phone]   [phone]  PROGRESS NOTE  Patient Care Team: Shirline Frees, NP as PCP - General (Family Medicine) Jaci Standard, MD as Consulting Physician (Hematology and Oncology) Aris Lot, MD as Consulting Physician (Dermatology)  Hematological/Oncological History # Plasmacytomas without Multiple Myeloma -10/15/2020: MRI showed pathologic fracture the medial clavicle associated with a 5.1 x 2.7 x 4.1 cm mass  -10/23/2020: Establish care with diagnostic clinic at [facility].  Serological testing concerning for a plasma cell neoplasm, with UPEP showing 7.4 g of protein daily with markedly high lambda light chains -11/07/2020: Bone marrow biopsy performed, shows no evidence of multiple myeloma -11/23/2020: Biopsy of right clavicle mass confirmed plasmacytoma.  -11/27/2020: PET scan show intensely hypermetabolic large soft tissue masses arising from the LEFT and RIGHT ribs. Two large lesions in the LEFT chest wall and a single large lesion in the RIGHT chest wall.Hypermetabolic expansile solid masses arising from the medial RIGHT clavicle and RIGHT inferior pubic ischium 01/09/2021: Cycle 1, Day 1 CyBorD 02/06/2021: Cycle 2, Day 1 CyBorD 03/06/2021: Cycle 3, Day 1 CyBorD 04/03/2021: Cycle 4, Day 1 CyBorD  05/01/2021: Cycle 5, Day 1 CyBorD  05/29/2021: Cycle 6, Day 1 CyBorD  06/26/2021: Cycle 7, Day 1 CyBorD  07/29/2021: Started maintenance Revlimid 5 mg once daily, 21 days on, 7 days off every 28 days.  10/2021: Decreased to maintenance Revlimid 2.5 mg once daily, 21 days on, 7 days off every 28 days.  04/2022: Decreased to maintenance Revlimid 2.5 mg every other day, 21 days on, 7 days off every 28 days.   Interval History:  Jordan Gregory 68 y.o. 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 The patient was last seen on 01/21/2023.  In the  interim, she has continued maintenance Revlimid therapy.   On exam today Jordan Gregory reports she has been well overall in the interim since her last visit with no major problems.  She reports she did not have flu, coronavirus, or norovirus in the interim since her last visit.  She reports that she is not having any trouble with her Revlimid 2.5 every other day.  She reports that she is taking it faithfully and it is arriving on time.  She reports that she does still have some occasional abdominal neuropathy but at this not worsening.  She is taking her 81 mg p.o. daily aspirin without any bleeding, bruising, or dark stools.  She reports her energy levels are good.  She is able to do everything she needs to do at home without any difficulty including yard work.  Her back does occasionally get sore but she is not having any new or worsening back pain.  She reports her energy levels are improved.  Overall she is at her baseline level of health and willing and able to continue with Revlimid therapy at this time.. She denies fevers, chills, night sweats, shortness of breath, chest pain or cough.  She has no other complaints.  Rest of the 10 point ROS is below.  MEDICAL HISTORY:  Past Medical History:  Diagnosis Date   Acute renal disease    Hypertension    Insomnia    Plasmacytoma (HCC)    Thyroid disease    Vitamin D deficiency     SURGICAL HISTORY: Past Surgical History:  Procedure Laterality Date   MOHS SURGERY  2016    SOCIAL HISTORY: Social History   Socioeconomic  History   Marital status: Married    Spouse name: Not on file   Number of children: Not on file   Years of education: Not on file   Highest education level: Master's degree (e.g., MA, MS, MEng, MEd, MSW, MBA)  Occupational History   Not on file  Tobacco Use   Smoking status: Never   Smokeless tobacco: Never  Vaping Use   Vaping status: Never Used  Substance and Sexual Activity   Alcohol use: Yes    Alcohol/week: 5.0  standard drinks of alcohol    Types: 5 Standard drinks or equivalent per week   Drug use: No   Sexual activity: Not Currently    Partners: Male    Birth control/protection: Post-menopausal    Comment: 1st intercourse 66 yo-5 partners  Other Topics Concern   Not on file  Social History Narrative   She works at [employer] and [employer].    Married - 23 years    No kids       She likes to travel, read, cycle, be outside.    Social Drivers of Corporate investment banker Strain: Low Risk  (01/01/2023)   Overall Financial Resource Strain (CARDIA)    Difficulty of Paying Living Expenses: Not hard at all  Food Insecurity: No Food Insecurity (01/01/2023)   Hunger Vital Sign    Worried About Running Out of Food in the Last Year: Never true    Ran Out of Food in the Last Year: Never true  Transportation Needs: No Transportation Needs (01/01/2023)   PRAPARE - Administrator, Civil Service (Medical): No    Lack of Transportation (Non-Medical): No  Physical Activity: Sufficiently Active (01/01/2023)   Exercise Vital Sign    Days of Exercise per Week: 4 days    Minutes of Exercise per Session: 40 min  Stress: Stress Concern Present (01/01/2023)   [employer] of Occupational Health - Occupational Stress Questionnaire    Feeling of Stress : To some extent  Social Connections: Moderately Isolated (01/01/2023)   Social Connection and Isolation Panel [NHANES]    Frequency of Communication with Friends and Family: More than three times a week    Frequency of Social Gatherings with Friends and Family: Once a week    Attends Religious Services: Never    Database administrator or Organizations: No    Attends Engineer, structural: Not on file    Marital Status: Married  Catering manager Violence: Not At Risk (12/04/2020)   Humiliation, Afraid, Rape, and Kick questionnaire    Fear of Current or Ex-Partner: No    Emotionally Abused: No    Physically Abused: No    Sexually  Abused: No    FAMILY HISTORY: Family History  Problem Relation Age of Onset   Arthritis Mother    Hypertension Mother    Dementia Mother    Heart disease Father    Liver cancer Father    Asperger's syndrome Brother    Atrial fibrillation Brother     ALLERGIES:  is allergic to ramipril and velcade [bortezomib].  MEDICATIONS:  Current Outpatient Medications  Medication Sig Dispense Refill   aspirin EC 81 MG tablet Take 81 mg by mouth daily. Swallow whole.     calcium carbonate (OS-CAL) 600 MG TABS Take 600 mg by mouth daily.     citalopram (CELEXA) 10 MG tablet Take 1 tablet (10 mg total) by mouth daily. 90 tablet 3   lenalidomide (REVLIMID) 2.5 MG capsule  TAKE 1 CAPSULE BY MOUTH EVERY OTHER DAY FOR 21 DAYS ON THEN 7 DAYS OFF 11 capsule 0   levothyroxine (SYNTHROID) 75 MCG tablet TAKE 1 TABLET BY MOUTH EVERY DAY BEFORE BREAKFAST 90 tablet 3   traZODone (DESYREL) 50 MG tablet TAKE 1/2 TO 1 TABLET BY MOUTH AT BEDTIME AS NEEDED FOR SLEEP 90 tablet 1   VITAMIN D, CHOLECALCIFEROL, PO Take 2,000 Int'l Units by mouth daily.     No current facility-administered medications for this visit.    REVIEW OF SYSTEMS:   Constitutional: ( - ) fevers, ( - )  chills , ( - ) night sweats Eyes: ( - ) blurriness of vision, ( - ) double vision, ( - ) watery eyes Ears, nose, mouth, throat, and face: ( - ) mucositis, ( - ) sore throat Respiratory: ( - ) cough, ( - ) dyspnea, ( - ) wheezes Cardiovascular: ( - ) palpitation, ( - ) chest discomfort, ( - ) lower extremity swelling Gastrointestinal:  ( - ) nausea, ( - ) heartburn, ( + ) change in bowel habits Skin: ( - ) abnormal skin rashes Lymphatics: ( - ) new lymphadenopathy, ( - ) easy bruising Neurological: ( + ) numbness, ( - ) tingling, ( - ) new weaknesses Behavioral/Psych: ( - ) mood change, ( - ) new changes  All other systems were reviewed with the patient and are negative.  PHYSICAL EXAMINATION: ECOG PERFORMANCE STATUS: 1 - Symptomatic but  completely ambulatory  Vitals:   04/15/23 1114  BP: (!) 144/78  Pulse: 65  Resp: 13  Temp: 97.6 F (36.4 C)  SpO2: 100%       Filed Weights   04/15/23 1114  Weight: 189 lb (85.7 kg)      GENERAL: Well-appearing middle-age Caucasian female, alert, no distress and comfortable SKIN: skin color, texture, turgor are normal, no rashes or significant lesions EYES: conjunctiva are pink and non-injected, sclera clear LUNGS: clear to auscultation and percussion with normal breathing effort HEART: regular rate & rhythm and no murmurs and no lower extremity edema Musculoskeletal: no cyanosis of digits and no clubbing  PSYCH: alert & oriented x 3, fluent speech NEURO: no focal motor/sensory deficits  LABORATORY DATA:  I have reviewed the data as listed    Latest Ref Rng & Units 04/15/2023   10:39 AM 03/18/2023   10:55 AM 02/18/2023   10:14 AM  CBC  WBC 4.0 - 10.5 K/uL 2.6  2.3  2.7   Hemoglobin 12.0 - 15.0 g/dL 16.1  09.6  04.5   Hematocrit 36.0 - 46.0 % 40.6  39.6  39.8   Platelets 150 - 400 K/uL 120  115  122        Latest Ref Rng & Units 04/15/2023   10:39 AM 03/18/2023   10:55 AM 02/18/2023   10:14 AM  CMP  Glucose 70 - 99 mg/dL 409  811  98   BUN 8 - 23 mg/dL 21  21  29    Creatinine 0.44 - 1.00 mg/dL 9.14  7.82  9.56   Sodium 135 - 145 mmol/L 140  138  139   Potassium 3.5 - 5.1 mmol/L 4.3  4.2  4.4   Chloride 98 - 111 mmol/L 106  107  108   CO2 22 - 32 mmol/L 25  24  25    Calcium 8.9 - 10.3 mg/dL 9.2  8.7  9.2   Total Protein 6.5 - 8.1 g/dL 7.4  6.8  7.0  Total Bilirubin 0.0 - 1.2 mg/dL 0.6  0.5  0.4   Alkaline Phos 38 - 126 U/L 71  71  72   AST 15 - 41 U/L 13  13  11    ALT 0 - 44 U/L 11  12  10      Lab Results  Component Value Date   MPROTEIN Not Observed 03/18/2023   MPROTEIN Not Observed 02/18/2023   MPROTEIN Not Observed 01/21/2023   Lab Results  Component Value Date   KPAFRELGTCHN 19.8 (H) 03/18/2023   KPAFRELGTCHN 20.2 (H) 02/18/2023   KPAFRELGTCHN  19.9 (H) 01/21/2023   LAMBDASER 16.1 03/18/2023   LAMBDASER 15.0 02/18/2023   LAMBDASER 14.4 01/21/2023   KAPLAMBRATIO 1.23 03/18/2023   KAPLAMBRATIO 1.35 02/18/2023   KAPLAMBRATIO 1.38 01/21/2023     RADIOGRAPHIC STUDIES: No results found.  ASSESSMENT & PLAN Jordan Gregory is a 68 y.o. 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  Previously we discussed the diagnosis and treatment options for plasmacytomas without multiple myeloma. This includes radiation therapy verus chemotherapy. Dr. Leonides Schanz discussed case with Dr. Alvino Chapel from [facility] and the recommendation was to proceed with chemotherapy due to patients decline in renal function.  Given the patient's kidney dysfunction at this time the treatment of choice was CyBorD chemotherapy.  Patient received 7 cycles of CyBorD from 01/09/2021-06/26/2021.   Jordan Gregory transitioned to maintenance Revlimid 5 mg PO daily starting 07/29/2021.   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   --Serological studies are confirmatory of a plasma cell neoplasm with markedly high lambda light chains and proteinuria.  --Bone marrow biopsy from 11/07/2020 showed no evidence of multiple myeloma. Right clavicle biopsy from 11/23/2020 confirmed plasma cell neoplasm consistent with plasmacytomas.  --PET scan from 11/27/2020 showed multifocal hypermetabolic soft tissue massing arising from the left and right ribs, left and right chest wall, right clavicle and right inferior pubic ischium. Largest is solid mass in the anterior left chest wall measuring 10.2 x 9.9 cm.  --Recommended to initiate systemic chemotherapy due to acute renal dysfunction.  --Received 7 cycles of CyBorD from 01/09/2021 to 06/26/2021 --Starting 07/29/2021, started maintenance therapy revlimid 5 mg PO daily 21 days on 7 days off.  Plan: --Most  recent M protein was undetectable on 09/17/2022. M protein was not detected and normal serum free light chains.  --Labs today were reviewed and showed WBC 2.6, hemoglobin 13.8, MCV 89.0, platelets 120 --continue Revlimid dose 2.5 mg  PO every other day, 21 days on and 7 days off.  --RTC for monthly labs and toxicity check in 3 months.   #Neutropenia/Thrombocytopenia: --Secondary to revlimid  #Renal dysfunction--improved: --Secondary to damage from malignancy.  --Creatinine 1.65 --Continue to monitor closely.   #Supportive Care -- port placement not required -- zofran 8mg  q8H PRN and compazine 10mg  PO q6H for nausea -- stool softeners/laxatives for constipation as needed  No orders of the defined types were placed in this encounter.  All questions were answered. The patient knows to call the clinic with any problems, questions or concerns.  I have spent a total of 30 minutes minutes of face-to-face and non-face-to-face time, preparing to see the patient, performing a medically appropriate examination, counseling and educating the patient, ordering medications, documenting clinical information in the electronic health record,  and care coordination.   Ulysees Barns, MD Department of Hematology/Oncology [facility]  Hospital Phone: [phone] Pager: [phone] Email: [email or web address]

## 2023-04-16 ENCOUNTER — Other Ambulatory Visit: Payer: Self-pay | Admitting: *Deleted

## 2023-04-16 DIAGNOSIS — C9000 Multiple myeloma not having achieved remission: Secondary | ICD-10-CM

## 2023-04-16 LAB — KAPPA/LAMBDA LIGHT CHAINS
Kappa free light chain: 20.6 mg/L — ABNORMAL HIGH (ref 3.3–19.4)
Kappa, lambda light chain ratio: 1.18 (ref 0.26–1.65)
Lambda free light chains: 17.4 mg/L (ref 5.7–26.3)

## 2023-04-16 MED ORDER — LENALIDOMIDE 2.5 MG PO CAPS
2.5000 mg | ORAL_CAPSULE | ORAL | 0 refills | Status: DC
Start: 2023-04-16 — End: 2023-05-13

## 2023-04-17 LAB — MULTIPLE MYELOMA PANEL, SERUM
Albumin SerPl Elph-Mcnc: 3.9 g/dL (ref 2.9–4.4)
Albumin/Glob SerPl: 1.4 (ref 0.7–1.7)
Alpha 1: 0.3 g/dL (ref 0.0–0.4)
Alpha2 Glob SerPl Elph-Mcnc: 0.7 g/dL (ref 0.4–1.0)
B-Globulin SerPl Elph-Mcnc: 1 g/dL (ref 0.7–1.3)
Gamma Glob SerPl Elph-Mcnc: 1 g/dL (ref 0.4–1.8)
Globulin, Total: 3 g/dL (ref 2.2–3.9)
IgA: 39 mg/dL — ABNORMAL LOW (ref 87–352)
IgG (Immunoglobin G), Serum: 908 mg/dL (ref 586–1602)
IgM (Immunoglobulin M), Srm: 43 mg/dL (ref 26–217)
Total Protein ELP: 6.9 g/dL (ref 6.0–8.5)

## 2023-04-21 ENCOUNTER — Other Ambulatory Visit (HOSPITAL_BASED_OUTPATIENT_CLINIC_OR_DEPARTMENT_OTHER)

## 2023-05-13 ENCOUNTER — Inpatient Hospital Stay: Payer: 59 | Attending: Hematology and Oncology

## 2023-05-13 ENCOUNTER — Other Ambulatory Visit: Payer: Self-pay | Admitting: Hematology and Oncology

## 2023-05-13 DIAGNOSIS — C9030 Solitary plasmacytoma not having achieved remission: Secondary | ICD-10-CM | POA: Insufficient documentation

## 2023-05-13 DIAGNOSIS — C9001 Multiple myeloma in remission: Secondary | ICD-10-CM

## 2023-05-13 DIAGNOSIS — C9000 Multiple myeloma not having achieved remission: Secondary | ICD-10-CM

## 2023-05-13 LAB — CBC WITH DIFFERENTIAL (CANCER CENTER ONLY)
Abs Immature Granulocytes: 0.01 10*3/uL (ref 0.00–0.07)
Basophils Absolute: 0.1 10*3/uL (ref 0.0–0.1)
Basophils Relative: 3 %
Eosinophils Absolute: 0.2 10*3/uL (ref 0.0–0.5)
Eosinophils Relative: 8 %
HCT: 40.2 % (ref 36.0–46.0)
Hemoglobin: 14.1 g/dL (ref 12.0–15.0)
Immature Granulocytes: 0 %
Lymphocytes Relative: 24 %
Lymphs Abs: 0.6 10*3/uL — ABNORMAL LOW (ref 0.7–4.0)
MCH: 30.2 pg (ref 26.0–34.0)
MCHC: 35.1 g/dL (ref 30.0–36.0)
MCV: 86.1 fL (ref 80.0–100.0)
Monocytes Absolute: 0.2 10*3/uL (ref 0.1–1.0)
Monocytes Relative: 9 %
Neutro Abs: 1.3 10*3/uL — ABNORMAL LOW (ref 1.7–7.7)
Neutrophils Relative %: 56 %
Platelet Count: 123 10*3/uL — ABNORMAL LOW (ref 150–400)
RBC: 4.67 MIL/uL (ref 3.87–5.11)
RDW: 13.6 % (ref 11.5–15.5)
WBC Count: 2.4 10*3/uL — ABNORMAL LOW (ref 4.0–10.5)
nRBC: 0 % (ref 0.0–0.2)

## 2023-05-13 LAB — CMP (CANCER CENTER ONLY)
ALT: 18 U/L (ref 0–44)
AST: 17 U/L (ref 15–41)
Albumin: 4.4 g/dL (ref 3.5–5.0)
Alkaline Phosphatase: 73 U/L (ref 38–126)
Anion gap: 10 (ref 5–15)
BUN: 23 mg/dL (ref 8–23)
CO2: 22 mmol/L (ref 22–32)
Calcium: 8.8 mg/dL — ABNORMAL LOW (ref 8.9–10.3)
Chloride: 107 mmol/L (ref 98–111)
Creatinine: 1.73 mg/dL — ABNORMAL HIGH (ref 0.44–1.00)
GFR, Estimated: 32 mL/min — ABNORMAL LOW (ref >60)
Glucose, Bld: 113 mg/dL — ABNORMAL HIGH (ref 70–99)
Potassium: 4.4 mmol/L (ref 3.5–5.1)
Sodium: 139 mmol/L (ref 135–145)
Total Bilirubin: 0.5 mg/dL (ref 0.0–1.2)
Total Protein: 7 g/dL (ref 6.5–8.1)

## 2023-05-13 LAB — LACTATE DEHYDROGENASE: LDH: 136 U/L (ref 98–192)

## 2023-05-14 LAB — KAPPA/LAMBDA LIGHT CHAINS
Kappa free light chain: 23.5 mg/L — ABNORMAL HIGH (ref 3.3–19.4)
Kappa, lambda light chain ratio: 1.28 (ref 0.26–1.65)
Lambda free light chains: 18.4 mg/L (ref 5.7–26.3)

## 2023-05-17 LAB — MULTIPLE MYELOMA PANEL, SERUM
Albumin SerPl Elph-Mcnc: 3.8 g/dL (ref 2.9–4.4)
Albumin/Glob SerPl: 1.5 (ref 0.7–1.7)
Alpha 1: 0.2 g/dL (ref 0.0–0.4)
Alpha2 Glob SerPl Elph-Mcnc: 0.7 g/dL (ref 0.4–1.0)
B-Globulin SerPl Elph-Mcnc: 0.9 g/dL (ref 0.7–1.3)
Gamma Glob SerPl Elph-Mcnc: 0.8 g/dL (ref 0.4–1.8)
Globulin, Total: 2.6 g/dL (ref 2.2–3.9)
IgA: 61 mg/dL — ABNORMAL LOW (ref 87–352)
IgG (Immunoglobin G), Serum: 911 mg/dL (ref 586–1602)
IgM (Immunoglobulin M), Srm: 46 mg/dL (ref 26–217)
Total Protein ELP: 6.4 g/dL (ref 6.0–8.5)

## 2023-05-18 ENCOUNTER — Encounter: Payer: Self-pay | Admitting: Physician Assistant

## 2023-06-09 ENCOUNTER — Other Ambulatory Visit: Payer: Self-pay | Admitting: Hematology and Oncology

## 2023-06-09 DIAGNOSIS — C9000 Multiple myeloma not having achieved remission: Secondary | ICD-10-CM

## 2023-06-10 ENCOUNTER — Inpatient Hospital Stay: Payer: 59 | Attending: Hematology and Oncology

## 2023-06-10 DIAGNOSIS — C9030 Solitary plasmacytoma not having achieved remission: Secondary | ICD-10-CM | POA: Diagnosis present

## 2023-06-10 DIAGNOSIS — C9001 Multiple myeloma in remission: Secondary | ICD-10-CM

## 2023-06-10 LAB — CMP (CANCER CENTER ONLY)
ALT: 13 U/L (ref 0–44)
AST: 14 U/L — ABNORMAL LOW (ref 15–41)
Albumin: 4.4 g/dL (ref 3.5–5.0)
Alkaline Phosphatase: 67 U/L (ref 38–126)
Anion gap: 7 (ref 5–15)
BUN: 25 mg/dL — ABNORMAL HIGH (ref 8–23)
CO2: 23 mmol/L (ref 22–32)
Calcium: 8.8 mg/dL — ABNORMAL LOW (ref 8.9–10.3)
Chloride: 108 mmol/L (ref 98–111)
Creatinine: 1.9 mg/dL — ABNORMAL HIGH (ref 0.44–1.00)
GFR, Estimated: 28 mL/min — ABNORMAL LOW (ref >60)
Glucose, Bld: 111 mg/dL — ABNORMAL HIGH (ref 70–99)
Potassium: 4.4 mmol/L (ref 3.5–5.1)
Sodium: 138 mmol/L (ref 135–145)
Total Bilirubin: 0.5 mg/dL (ref 0.0–1.2)
Total Protein: 7.1 g/dL (ref 6.5–8.1)

## 2023-06-10 LAB — CBC WITH DIFFERENTIAL (CANCER CENTER ONLY)
Abs Immature Granulocytes: 0.01 10*3/uL (ref 0.00–0.07)
Basophils Absolute: 0.1 10*3/uL (ref 0.0–0.1)
Basophils Relative: 3 %
Eosinophils Absolute: 0.2 10*3/uL (ref 0.0–0.5)
Eosinophils Relative: 6 %
HCT: 39.9 % (ref 36.0–46.0)
Hemoglobin: 13.9 g/dL (ref 12.0–15.0)
Immature Granulocytes: 0 %
Lymphocytes Relative: 28 %
Lymphs Abs: 0.8 10*3/uL (ref 0.7–4.0)
MCH: 30.8 pg (ref 26.0–34.0)
MCHC: 34.8 g/dL (ref 30.0–36.0)
MCV: 88.3 fL (ref 80.0–100.0)
Monocytes Absolute: 0.3 10*3/uL (ref 0.1–1.0)
Monocytes Relative: 10 %
Neutro Abs: 1.5 10*3/uL — ABNORMAL LOW (ref 1.7–7.7)
Neutrophils Relative %: 53 %
Platelet Count: 125 10*3/uL — ABNORMAL LOW (ref 150–400)
RBC: 4.52 MIL/uL (ref 3.87–5.11)
RDW: 14.4 % (ref 11.5–15.5)
WBC Count: 2.8 10*3/uL — ABNORMAL LOW (ref 4.0–10.5)
nRBC: 0 % (ref 0.0–0.2)

## 2023-06-10 LAB — LACTATE DEHYDROGENASE: LDH: 135 U/L (ref 98–192)

## 2023-06-11 LAB — KAPPA/LAMBDA LIGHT CHAINS
Kappa free light chain: 21.2 mg/L — ABNORMAL HIGH (ref 3.3–19.4)
Kappa, lambda light chain ratio: 1 (ref 0.26–1.65)
Lambda free light chains: 21.1 mg/L (ref 5.7–26.3)

## 2023-06-12 LAB — MULTIPLE MYELOMA PANEL, SERUM
Albumin SerPl Elph-Mcnc: 3.7 g/dL (ref 2.9–4.4)
Albumin/Glob SerPl: 1.4 (ref 0.7–1.7)
Alpha 1: 0.2 g/dL (ref 0.0–0.4)
Alpha2 Glob SerPl Elph-Mcnc: 0.6 g/dL (ref 0.4–1.0)
B-Globulin SerPl Elph-Mcnc: 1 g/dL (ref 0.7–1.3)
Gamma Glob SerPl Elph-Mcnc: 0.9 g/dL (ref 0.4–1.8)
Globulin, Total: 2.7 g/dL (ref 2.2–3.9)
IgA: 51 mg/dL — ABNORMAL LOW (ref 87–352)
IgG (Immunoglobin G), Serum: 924 mg/dL (ref 586–1602)
IgM (Immunoglobulin M), Srm: 41 mg/dL (ref 26–217)
Total Protein ELP: 6.4 g/dL (ref 6.0–8.5)

## 2023-06-14 ENCOUNTER — Other Ambulatory Visit: Payer: Self-pay | Admitting: Adult Health

## 2023-06-14 DIAGNOSIS — C9030 Solitary plasmacytoma not having achieved remission: Secondary | ICD-10-CM

## 2023-06-14 DIAGNOSIS — E039 Hypothyroidism, unspecified: Secondary | ICD-10-CM

## 2023-07-08 ENCOUNTER — Other Ambulatory Visit: Payer: Self-pay | Admitting: Hematology and Oncology

## 2023-07-08 DIAGNOSIS — C9000 Multiple myeloma not having achieved remission: Secondary | ICD-10-CM

## 2023-07-09 ENCOUNTER — Telehealth: Payer: Self-pay | Admitting: Pharmacy Technician

## 2023-07-09 NOTE — Telephone Encounter (Signed)
 Oral Oncology Patient Advocate Encounter   Received notification that prior authorization for lenalidomide  is due for renewal.   PA submitted on 07/09/23 Key [id number] Status is pending     Estefana Sox, CPhT-Adv Oncology Pharmacy Patient Advocate [facility]  Direct Number: [phone]  Fax: [phone]

## 2023-07-13 NOTE — Telephone Encounter (Signed)
 Oral Oncology Patient Advocate Encounter  Prior Authorization for lenalidomide  has been approved.    PA# [id number] Effective dates: 07/08/23 through 07/07/24  Patient must continue to fill at [facility].    Estefana Sox, CPhT-Adv Oncology Pharmacy Patient Advocate [facility]  Direct Number: [phone]  Fax: [phone]

## 2023-07-14 NOTE — Progress Notes (Unsigned)
 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  Serological testing concerning for a plasma cell neoplasm, with UPEP showing 7.4 g of protein daily with markedly high lambda light chains -11/07/2020: Bone marrow biopsy performed, shows no evidence of multiple myeloma -11/23/2020: Biopsy of right clavicle mass confirmed plasmacytoma.  -11/27/2020: PET scan show intensely hypermetabolic large soft tissue masses arising from the LEFT and RIGHT ribs. Two large lesions in the LEFT chest wall and a single large lesion in the RIGHT chest wall.Hypermetabolic expansile solid masses arising from the medial RIGHT clavicle and RIGHT inferior pubic ischium 01/09/2021: Cycle 1, Day 1 CyBorD 02/06/2021: Cycle 2, Day 1 CyBorD 03/06/2021: Cycle 3, Day 1 CyBorD 04/03/2021: Cycle 4, Day 1 CyBorD  05/01/2021: Cycle 5, Day 1 CyBorD  05/29/2021: Cycle 6, Day 1 CyBorD  06/26/2021: Cycle 7, Day 1 CyBorD  07/29/2021: Started maintenance Revlimid  5 mg once daily, 21 days on, 7 days off every 28 days.  10/2021: Decreased to maintenance Revlimid  2.5 mg once daily, 21 days on, 7 days off every 28 days.  04/2022: Decreased to maintenance Revlimid  2.5 mg every other day, 21 days on, 7 days off every 28 days.   Interval History:  Jordan Gregory 68 y.o. 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 The patient was last seen on 04/15/2023.  In the  interim, she has continued maintenance Revlimid  therapy.   On exam today [name] reports she has been well overall in the interim as her last visit.  She reports that she had a grilled meal for dinner on July 4, very traditional.  She reports that she has been well overall in the interim since her last visit.  She notes that she is keeping well-hydrated and that her health is stable.  She reports that she is not enjoying the heat.  She reports that overall her energy levels are good.  Her appetite is strong.  She denies any nausea, vomiting, or diarrhea.  She is not having any lightheadedness, dizziness, or shortness of breath.  She is taking her aspirin as prescribed with no bleeding, bruising, or dark stools.  Overall she is willing and able to continue on maintenance Revlimid  at this time.  Full 10 point ROS is otherwise negative.  MEDICAL HISTORY:  Past Medical History:  Diagnosis Date   Acute renal disease    Hypertension    Insomnia    Plasmacytoma (HCC)    Thyroid  disease    Vitamin D  deficiency     SURGICAL HISTORY: Past Surgical History:  Procedure Laterality Date   MOHS SURGERY  2016    SOCIAL HISTORY: Social History   Socioeconomic History   Marital status: Married    Spouse name: Not on file   Number of children: Not on file   Years of education: Not on file   Highest education level: Master's degree (e.g., MA, MS, MEng, MEd, MSW, MBA)  Occupational History   Not on file  Tobacco Use   Smoking status: Never  Smokeless tobacco: Never  Vaping Use   Vaping status: Never Used  Substance and Sexual Activity   Alcohol use: Yes    Alcohol/week: 5.0 standard drinks of alcohol    Types: 5 Standard drinks or equivalent per week   Drug use: No   Sexual activity: Not Currently    Partners: Male    Birth control/protection: Post-menopausal    Comment: 1st intercourse 26 yo-5 partners  Other Topics Concern   Not on file  Social History Narrative   She works at  [employer] and [employer].    Married - 23 years    No kids       She likes to travel, read, cycle, be outside.    Social Drivers of Corporate investment banker Strain: Low Risk  (01/01/2023)   Overall Financial Resource Strain (CARDIA)    Difficulty of Paying Living Expenses: Not hard at all  Food Insecurity: No Food Insecurity (01/01/2023)   Hunger Vital Sign    Worried About Running Out of Food in the Last Year: Never true    Ran Out of Food in the Last Year: Never true  Transportation Needs: No Transportation Needs (01/01/2023)   PRAPARE - Administrator, Civil Service (Medical): No    Lack of Transportation (Non-Medical): No  Physical Activity: Sufficiently Active (01/01/2023)   Exercise Vital Sign    Days of Exercise per Week: 4 days    Minutes of Exercise per Session: 40 min  Stress: Stress Concern Present (01/01/2023)   [employer] of Occupational Health - Occupational Stress Questionnaire    Feeling of Stress : To some extent  Social Connections: Moderately Isolated (01/01/2023)   Social Connection and Isolation Panel    Frequency of Communication with Friends and Family: More than three times a week    Frequency of Social Gatherings with Friends and Family: Once a week    Attends Religious Services: Never    Database administrator or Organizations: No    Attends Engineer, structural: Not on file    Marital Status: Married  Catering manager Violence: Not At Risk (12/04/2020)   Humiliation, Afraid, Rape, and Kick questionnaire    Fear of Current or Ex-Partner: No    Emotionally Abused: No    Physically Abused: No    Sexually Abused: No    FAMILY HISTORY: Family History  Problem Relation Age of Onset   Arthritis Mother    Hypertension Mother    Dementia Mother    Heart disease Father    Liver cancer Father    Asperger's syndrome Brother    Atrial fibrillation Brother     ALLERGIES:  is allergic to ramipril and velcade   [bortezomib ].  MEDICATIONS:  Current Outpatient Medications  Medication Sig Dispense Refill   aspirin EC 81 MG tablet Take 81 mg by mouth daily. Swallow whole.     calcium carbonate (OS-CAL) 600 MG TABS Take 600 mg by mouth daily.     citalopram  (CELEXA ) 10 MG tablet Take 1 tablet (10 mg total) by mouth daily. 90 tablet 3   lenalidomide  (REVLIMID ) 2.5 MG capsule TAKE 1 CAPSULE BY MOUTH EVERY OTHER DAY FOR 21 DAYS ON THEN 7 DAYS OFF 11 capsule 0   levothyroxine  (SYNTHROID ) 75 MCG tablet TAKE 1 TABLET BY MOUTH EVERY DAY BEFORE BREAKFAST 90 tablet 3   traZODone  (DESYREL ) 50 MG tablet TAKE 1/2 TO 1 TABLET BY MOUTH AT BEDTIME AS NEEDED FOR SLEEP 90 tablet 1  VITAMIN D , CHOLECALCIFEROL, PO Take 2,000 Int'l Units by mouth daily.     No current facility-administered medications for this visit.    REVIEW OF SYSTEMS:   Constitutional: ( - ) fevers, ( - )  chills , ( - ) night sweats Eyes: ( - ) blurriness of vision, ( - ) double vision, ( - ) watery eyes Ears, nose, mouth, throat, and face: ( - ) mucositis, ( - ) sore throat Respiratory: ( - ) cough, ( - ) dyspnea, ( - ) wheezes Cardiovascular: ( - ) palpitation, ( - ) chest discomfort, ( - ) lower extremity swelling Gastrointestinal:  ( - ) nausea, ( - ) heartburn, ( + ) change in bowel habits Skin: ( - ) abnormal skin rashes Lymphatics: ( - ) new lymphadenopathy, ( - ) easy bruising Neurological: ( + ) numbness, ( - ) tingling, ( - ) new weaknesses Behavioral/Psych: ( - ) mood change, ( - ) new changes  All other systems were reviewed with the patient and are negative.  PHYSICAL EXAMINATION: ECOG PERFORMANCE STATUS: 1 - Symptomatic but completely ambulatory  There were no vitals filed for this visit.      There were no vitals filed for this visit.     GENERAL: Well-appearing middle-age Caucasian female, alert, no distress and comfortable SKIN: skin color, texture, turgor are normal, no rashes or significant lesions EYES:  conjunctiva are pink and non-injected, sclera clear LUNGS: clear to auscultation and percussion with normal breathing effort HEART: regular rate & rhythm and no murmurs and no lower extremity edema Musculoskeletal: no cyanosis of digits and no clubbing  PSYCH: alert & oriented x 3, fluent speech NEURO: no focal motor/sensory deficits  LABORATORY DATA:  I have reviewed the data as listed    Latest Ref Rng & Units 06/10/2023   10:17 AM 05/13/2023   10:46 AM 04/15/2023   10:39 AM  CBC  WBC 4.0 - 10.5 K/uL 2.8  2.4  2.6   Hemoglobin 12.0 - 15.0 g/dL 86.0  85.8  86.1   Hematocrit 36.0 - 46.0 % 39.9  40.2  40.6   Platelets 150 - 400 K/uL 125  123  120        Latest Ref Rng & Units 06/10/2023   10:17 AM 05/13/2023   10:46 AM 04/15/2023   10:39 AM  CMP  Glucose 70 - 99 mg/dL 888  886  885   BUN 8 - 23 mg/dL 25  23  21    Creatinine 0.44 - 1.00 mg/dL 8.09  8.26  8.34   Sodium 135 - 145 mmol/L 138  139  140   Potassium 3.5 - 5.1 mmol/L 4.4  4.4  4.3   Chloride 98 - 111 mmol/L 108  107  106   CO2 22 - 32 mmol/L 23  22  25    Calcium 8.9 - 10.3 mg/dL 8.8  8.8  9.2   Total Protein 6.5 - 8.1 g/dL 7.1  7.0  7.4   Total Bilirubin 0.0 - 1.2 mg/dL 0.5  0.5  0.6   Alkaline Phos 38 - 126 U/L 67  73  71   AST 15 - 41 U/L 14  17  13    ALT 0 - 44 U/L 13  18  11      Lab Results  Component Value Date   MPROTEIN Not Observed 06/10/2023   MPROTEIN Not Observed 05/13/2023   MPROTEIN Not Observed 04/15/2023   Lab Results  Component Value Date  KPAFRELGTCHN 21.2 (H) 06/10/2023   KPAFRELGTCHN 23.5 (H) 05/13/2023   KPAFRELGTCHN 20.6 (H) 04/15/2023   LAMBDASER 21.1 06/10/2023   LAMBDASER 18.4 05/13/2023   LAMBDASER 17.4 04/15/2023   KAPLAMBRATIO 1.00 06/10/2023   KAPLAMBRATIO 1.28 05/13/2023   KAPLAMBRATIO 1.18 04/15/2023     RADIOGRAPHIC STUDIES: No results found.  ASSESSMENT & PLAN Jordan Gregory is a 68 y.o. female with medical history significant for plasmacytomas without multiple  myeloma who presents for a follow up visit.  Previously we discussed the diagnosis and treatment options for plasmacytomas without multiple myeloma. This includes radiation therapy verus chemotherapy. Dr. Federico discussed case with Dr. Rojelio from [facility] and the recommendation was to proceed with chemotherapy due to patients decline in renal function.  Given the patient's kidney dysfunction at this time the treatment of choice was CyBorD chemotherapy.  Patient received 7 cycles of CyBorD from 01/09/2021-06/26/2021.   Ms. Alford transitioned to maintenance Revlimid  5 mg PO daily starting 07/29/2021.   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   --Serological studies are confirmatory of a plasma cell neoplasm with markedly high lambda light chains and proteinuria.  --Bone marrow biopsy from 11/07/2020 showed no evidence of multiple myeloma. Right clavicle biopsy from 11/23/2020 confirmed plasma cell neoplasm consistent with plasmacytomas.  --PET scan from 11/27/2020 showed multifocal hypermetabolic soft tissue massing arising from the left and right ribs, left and right chest wall, right clavicle and right inferior pubic ischium. Largest is solid mass in the anterior left chest wall measuring 10.2 x 9.9 cm.  --Recommended to initiate systemic chemotherapy due to acute renal dysfunction.  --Received 7 cycles of CyBorD from 01/09/2021 to 06/26/2021 --Starting 07/29/2021, started maintenance therapy revlimid  5 mg PO daily 21 days on 7 days off.  Plan: --Most recent M protein was undetectable on 06/10/2023. M protein was not detected and normal serum free light chains.  --Labs today were reviewed and showed WBC 2.6, Hgb 14.1, MCV 88.2, Plt 136  --continue Revlimid  dose 2.5 mg  PO every other day, 21 days on and 7 days off.  --RTC for monthly labs and toxicity check in 3 months.   #Neutropenia/Thrombocytopenia: --Secondary to  revlimid   #Renal dysfunction--improved: --Secondary to damage from malignancy.  --Creatinine 1.82 --Continue to monitor closely.   #Supportive Care -- port placement not required -- zofran  8mg  q8H PRN and compazine  10mg  PO q6H for nausea -- stool softeners/laxatives for constipation as needed  No orders of the defined types were placed in this encounter.  All questions were answered. The patient knows to call the clinic with any problems, questions or concerns.  I have spent a total of 30 minutes minutes of face-to-face and non-face-to-face time, preparing to see the patient, performing a medically appropriate examination, counseling and educating the patient, ordering medications, documenting clinical information in the electronic health record,  and care coordination.   Norleen IVAR Federico, MD Department of Hematology/Oncology [facility] Phone: [phone] Pager: [phone] Email: [email or web address]

## 2023-07-15 ENCOUNTER — Inpatient Hospital Stay: Payer: 59 | Attending: Hematology and Oncology

## 2023-07-15 ENCOUNTER — Inpatient Hospital Stay (HOSPITAL_BASED_OUTPATIENT_CLINIC_OR_DEPARTMENT_OTHER): Payer: 59 | Admitting: Hematology and Oncology

## 2023-07-15 VITALS — BP 139/82 | HR 73 | Temp 97.4°F | Resp 16 | Ht 66.75 in | Wt 189.3 lb

## 2023-07-15 DIAGNOSIS — D696 Thrombocytopenia, unspecified: Secondary | ICD-10-CM | POA: Insufficient documentation

## 2023-07-15 DIAGNOSIS — D709 Neutropenia, unspecified: Secondary | ICD-10-CM | POA: Diagnosis not present

## 2023-07-15 DIAGNOSIS — C9001 Multiple myeloma in remission: Secondary | ICD-10-CM

## 2023-07-15 DIAGNOSIS — N289 Disorder of kidney and ureter, unspecified: Secondary | ICD-10-CM | POA: Insufficient documentation

## 2023-07-15 DIAGNOSIS — Z7982 Long term (current) use of aspirin: Secondary | ICD-10-CM | POA: Insufficient documentation

## 2023-07-15 DIAGNOSIS — C9030 Solitary plasmacytoma not having achieved remission: Secondary | ICD-10-CM | POA: Insufficient documentation

## 2023-07-15 LAB — CMP (CANCER CENTER ONLY)
ALT: 11 U/L (ref 0–44)
AST: 13 U/L — ABNORMAL LOW (ref 15–41)
Albumin: 4.3 g/dL (ref 3.5–5.0)
Alkaline Phosphatase: 76 U/L (ref 38–126)
Anion gap: 10 (ref 5–15)
BUN: 29 mg/dL — ABNORMAL HIGH (ref 8–23)
CO2: 20 mmol/L — ABNORMAL LOW (ref 22–32)
Calcium: 9 mg/dL (ref 8.9–10.3)
Chloride: 107 mmol/L (ref 98–111)
Creatinine: 1.82 mg/dL — ABNORMAL HIGH (ref 0.44–1.00)
GFR, Estimated: 30 mL/min — ABNORMAL LOW (ref >60)
Glucose, Bld: 113 mg/dL — ABNORMAL HIGH (ref 70–99)
Potassium: 4.3 mmol/L (ref 3.5–5.1)
Sodium: 137 mmol/L (ref 135–145)
Total Bilirubin: 0.5 mg/dL (ref 0.0–1.2)
Total Protein: 6.9 g/dL (ref 6.5–8.1)

## 2023-07-15 LAB — CBC WITH DIFFERENTIAL (CANCER CENTER ONLY)
Abs Immature Granulocytes: 0.01 K/uL (ref 0.00–0.07)
Basophils Absolute: 0.1 K/uL (ref 0.0–0.1)
Basophils Relative: 2 %
Eosinophils Absolute: 0.1 K/uL (ref 0.0–0.5)
Eosinophils Relative: 4 %
HCT: 40.4 % (ref 36.0–46.0)
Hemoglobin: 14.1 g/dL (ref 12.0–15.0)
Immature Granulocytes: 0 %
Lymphocytes Relative: 30 %
Lymphs Abs: 0.8 K/uL (ref 0.7–4.0)
MCH: 30.8 pg (ref 26.0–34.0)
MCHC: 34.9 g/dL (ref 30.0–36.0)
MCV: 88.2 fL (ref 80.0–100.0)
Monocytes Absolute: 0.3 K/uL (ref 0.1–1.0)
Monocytes Relative: 11 %
Neutro Abs: 1.4 K/uL — ABNORMAL LOW (ref 1.7–7.7)
Neutrophils Relative %: 53 %
Platelet Count: 136 K/uL — ABNORMAL LOW (ref 150–400)
RBC: 4.58 MIL/uL (ref 3.87–5.11)
RDW: 14.2 % (ref 11.5–15.5)
WBC Count: 2.6 K/uL — ABNORMAL LOW (ref 4.0–10.5)
nRBC: 0 % (ref 0.0–0.2)

## 2023-07-15 LAB — LACTATE DEHYDROGENASE: LDH: 138 U/L (ref 98–192)

## 2023-07-16 LAB — KAPPA/LAMBDA LIGHT CHAINS
Kappa free light chain: 21.3 mg/L — ABNORMAL HIGH (ref 3.3–19.4)
Kappa, lambda light chain ratio: 1.1 (ref 0.26–1.65)
Lambda free light chains: 19.3 mg/L (ref 5.7–26.3)

## 2023-07-20 LAB — MULTIPLE MYELOMA PANEL, SERUM
Albumin SerPl Elph-Mcnc: 4.1 g/dL (ref 2.9–4.4)
Albumin/Glob SerPl: 1.6 (ref 0.7–1.7)
Alpha 1: 0.2 g/dL (ref 0.0–0.4)
Alpha2 Glob SerPl Elph-Mcnc: 0.7 g/dL (ref 0.4–1.0)
B-Globulin SerPl Elph-Mcnc: 0.9 g/dL (ref 0.7–1.3)
Gamma Glob SerPl Elph-Mcnc: 0.9 g/dL (ref 0.4–1.8)
Globulin, Total: 2.7 g/dL (ref 2.2–3.9)
IgA: 44 mg/dL — ABNORMAL LOW (ref 87–352)
IgG (Immunoglobin G), Serum: 915 mg/dL (ref 586–1602)
IgM (Immunoglobulin M), Srm: 43 mg/dL (ref 26–217)
Total Protein ELP: 6.8 g/dL (ref 6.0–8.5)

## 2023-08-03 ENCOUNTER — Other Ambulatory Visit: Payer: Self-pay | Admitting: Hematology and Oncology

## 2023-08-03 DIAGNOSIS — C9000 Multiple myeloma not having achieved remission: Secondary | ICD-10-CM

## 2023-08-12 ENCOUNTER — Inpatient Hospital Stay: Attending: Hematology and Oncology

## 2023-08-12 DIAGNOSIS — C9030 Solitary plasmacytoma not having achieved remission: Secondary | ICD-10-CM | POA: Diagnosis present

## 2023-08-12 DIAGNOSIS — C9001 Multiple myeloma in remission: Secondary | ICD-10-CM

## 2023-08-12 LAB — CMP (CANCER CENTER ONLY)
ALT: 11 U/L (ref 0–44)
AST: 13 U/L — ABNORMAL LOW (ref 15–41)
Albumin: 4.2 g/dL (ref 3.5–5.0)
Alkaline Phosphatase: 69 U/L (ref 38–126)
Anion gap: 7 (ref 5–15)
BUN: 23 mg/dL (ref 8–23)
CO2: 22 mmol/L (ref 22–32)
Calcium: 8.8 mg/dL — ABNORMAL LOW (ref 8.9–10.3)
Chloride: 110 mmol/L (ref 98–111)
Creatinine: 1.67 mg/dL — ABNORMAL HIGH (ref 0.44–1.00)
GFR, Estimated: 33 mL/min — ABNORMAL LOW (ref >60)
Glucose, Bld: 114 mg/dL — ABNORMAL HIGH (ref 70–99)
Potassium: 4.5 mmol/L (ref 3.5–5.1)
Sodium: 139 mmol/L (ref 135–145)
Total Bilirubin: 0.5 mg/dL (ref 0.0–1.2)
Total Protein: 6.7 g/dL (ref 6.5–8.1)

## 2023-08-12 LAB — CBC WITH DIFFERENTIAL (CANCER CENTER ONLY)
Abs Immature Granulocytes: 0.01 K/uL (ref 0.00–0.07)
Basophils Absolute: 0.1 K/uL (ref 0.0–0.1)
Basophils Relative: 3 %
Eosinophils Absolute: 0.1 K/uL (ref 0.0–0.5)
Eosinophils Relative: 5 %
HCT: 40.7 % (ref 36.0–46.0)
Hemoglobin: 14 g/dL (ref 12.0–15.0)
Immature Granulocytes: 0 %
Lymphocytes Relative: 32 %
Lymphs Abs: 0.8 K/uL (ref 0.7–4.0)
MCH: 30.7 pg (ref 26.0–34.0)
MCHC: 34.4 g/dL (ref 30.0–36.0)
MCV: 89.3 fL (ref 80.0–100.0)
Monocytes Absolute: 0.3 K/uL (ref 0.1–1.0)
Monocytes Relative: 11 %
Neutro Abs: 1.2 K/uL — ABNORMAL LOW (ref 1.7–7.7)
Neutrophils Relative %: 49 %
Platelet Count: 123 K/uL — ABNORMAL LOW (ref 150–400)
RBC: 4.56 MIL/uL (ref 3.87–5.11)
RDW: 14 % (ref 11.5–15.5)
WBC Count: 2.5 K/uL — ABNORMAL LOW (ref 4.0–10.5)
nRBC: 0 % (ref 0.0–0.2)

## 2023-08-12 LAB — LACTATE DEHYDROGENASE: LDH: 129 U/L (ref 98–192)

## 2023-08-13 LAB — KAPPA/LAMBDA LIGHT CHAINS
Kappa free light chain: 19.8 mg/L — ABNORMAL HIGH (ref 3.3–19.4)
Kappa, lambda light chain ratio: 1.27 (ref 0.26–1.65)
Lambda free light chains: 15.6 mg/L (ref 5.7–26.3)

## 2023-08-14 LAB — MULTIPLE MYELOMA PANEL, SERUM
Albumin SerPl Elph-Mcnc: 3.9 g/dL (ref 2.9–4.4)
Albumin/Glob SerPl: 1.6 (ref 0.7–1.7)
Alpha 1: 0.2 g/dL (ref 0.0–0.4)
Alpha2 Glob SerPl Elph-Mcnc: 0.7 g/dL (ref 0.4–1.0)
B-Globulin SerPl Elph-Mcnc: 0.9 g/dL (ref 0.7–1.3)
Gamma Glob SerPl Elph-Mcnc: 0.8 g/dL (ref 0.4–1.8)
Globulin, Total: 2.6 g/dL (ref 2.2–3.9)
IgA: 48 mg/dL — ABNORMAL LOW (ref 87–352)
IgG (Immunoglobin G), Serum: 931 mg/dL (ref 586–1602)
IgM (Immunoglobulin M), Srm: 39 mg/dL (ref 26–217)
Total Protein ELP: 6.5 g/dL (ref 6.0–8.5)

## 2023-09-02 ENCOUNTER — Other Ambulatory Visit: Payer: Self-pay | Admitting: Hematology and Oncology

## 2023-09-02 ENCOUNTER — Other Ambulatory Visit: Payer: Self-pay | Admitting: *Deleted

## 2023-09-02 DIAGNOSIS — C9000 Multiple myeloma not having achieved remission: Secondary | ICD-10-CM

## 2023-09-02 MED ORDER — LENALIDOMIDE 2.5 MG PO CAPS
2.5000 mg | ORAL_CAPSULE | ORAL | 0 refills | Status: DC
Start: 2023-09-02 — End: 2023-09-24

## 2023-09-09 ENCOUNTER — Inpatient Hospital Stay: Attending: Hematology and Oncology

## 2023-09-09 DIAGNOSIS — C9030 Solitary plasmacytoma not having achieved remission: Secondary | ICD-10-CM | POA: Insufficient documentation

## 2023-09-09 DIAGNOSIS — C9001 Multiple myeloma in remission: Secondary | ICD-10-CM

## 2023-09-09 LAB — CMP (CANCER CENTER ONLY)
ALT: 12 U/L (ref 0–44)
AST: 13 U/L — ABNORMAL LOW (ref 15–41)
Albumin: 4.2 g/dL (ref 3.5–5.0)
Alkaline Phosphatase: 74 U/L (ref 38–126)
Anion gap: 9 (ref 5–15)
BUN: 25 mg/dL — ABNORMAL HIGH (ref 8–23)
CO2: 22 mmol/L (ref 22–32)
Calcium: 9 mg/dL (ref 8.9–10.3)
Chloride: 107 mmol/L (ref 98–111)
Creatinine: 1.69 mg/dL — ABNORMAL HIGH (ref 0.44–1.00)
GFR, Estimated: 32 mL/min — ABNORMAL LOW (ref >60)
Glucose, Bld: 111 mg/dL — ABNORMAL HIGH (ref 70–99)
Potassium: 4.6 mmol/L (ref 3.5–5.1)
Sodium: 138 mmol/L (ref 135–145)
Total Bilirubin: 0.6 mg/dL (ref 0.0–1.2)
Total Protein: 7.2 g/dL (ref 6.5–8.1)

## 2023-09-09 LAB — CBC WITH DIFFERENTIAL (CANCER CENTER ONLY)
Abs Immature Granulocytes: 0 K/uL (ref 0.00–0.07)
Basophils Absolute: 0.1 K/uL (ref 0.0–0.1)
Basophils Relative: 3 %
Eosinophils Absolute: 0.1 K/uL (ref 0.0–0.5)
Eosinophils Relative: 5 %
HCT: 40.4 % (ref 36.0–46.0)
Hemoglobin: 14.3 g/dL (ref 12.0–15.0)
Immature Granulocytes: 0 %
Lymphocytes Relative: 35 %
Lymphs Abs: 0.8 K/uL (ref 0.7–4.0)
MCH: 31.5 pg (ref 26.0–34.0)
MCHC: 35.4 g/dL (ref 30.0–36.0)
MCV: 89 fL (ref 80.0–100.0)
Monocytes Absolute: 0.2 K/uL (ref 0.1–1.0)
Monocytes Relative: 10 %
Neutro Abs: 1.1 K/uL — ABNORMAL LOW (ref 1.7–7.7)
Neutrophils Relative %: 47 %
Platelet Count: 134 K/uL — ABNORMAL LOW (ref 150–400)
RBC: 4.54 MIL/uL (ref 3.87–5.11)
RDW: 14.4 % (ref 11.5–15.5)
WBC Count: 2.4 K/uL — ABNORMAL LOW (ref 4.0–10.5)
nRBC: 0 % (ref 0.0–0.2)

## 2023-09-09 LAB — LACTATE DEHYDROGENASE: LDH: 140 U/L (ref 98–192)

## 2023-09-10 LAB — KAPPA/LAMBDA LIGHT CHAINS
Kappa free light chain: 17.8 mg/L (ref 3.3–19.4)
Kappa, lambda light chain ratio: 1.09 (ref 0.26–1.65)
Lambda free light chains: 16.4 mg/L (ref 5.7–26.3)

## 2023-09-14 LAB — MULTIPLE MYELOMA PANEL, SERUM
Albumin SerPl Elph-Mcnc: 3.8 g/dL (ref 2.9–4.4)
Albumin/Glob SerPl: 1.4 (ref 0.7–1.7)
Alpha 1: 0.2 g/dL (ref 0.0–0.4)
Alpha2 Glob SerPl Elph-Mcnc: 0.7 g/dL (ref 0.4–1.0)
B-Globulin SerPl Elph-Mcnc: 1 g/dL (ref 0.7–1.3)
Gamma Glob SerPl Elph-Mcnc: 0.9 g/dL (ref 0.4–1.8)
Globulin, Total: 2.9 g/dL (ref 2.2–3.9)
IgA: 63 mg/dL — ABNORMAL LOW (ref 87–352)
IgG (Immunoglobin G), Serum: 916 mg/dL (ref 586–1602)
IgM (Immunoglobulin M), Srm: 42 mg/dL (ref 26–217)
Total Protein ELP: 6.7 g/dL (ref 6.0–8.5)

## 2023-09-24 ENCOUNTER — Other Ambulatory Visit: Payer: Self-pay | Admitting: Hematology and Oncology

## 2023-09-24 ENCOUNTER — Other Ambulatory Visit: Payer: Self-pay | Admitting: *Deleted

## 2023-09-24 DIAGNOSIS — C9000 Multiple myeloma not having achieved remission: Secondary | ICD-10-CM

## 2023-09-24 MED ORDER — LENALIDOMIDE 2.5 MG PO CAPS: 2.5000 mg | ORAL_CAPSULE | ORAL | 0 refills | Status: DC

## 2023-10-06 ENCOUNTER — Other Ambulatory Visit: Payer: Self-pay | Admitting: Physician Assistant

## 2023-10-06 DIAGNOSIS — C9001 Multiple myeloma in remission: Secondary | ICD-10-CM

## 2023-10-06 NOTE — Progress Notes (Unsigned)
 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  Serological testing concerning for a plasma cell neoplasm, with UPEP showing 7.4 g of protein daily with markedly high lambda light chains -11/07/2020: Bone marrow biopsy performed, shows no evidence of multiple myeloma -11/23/2020: Biopsy of right clavicle mass confirmed plasmacytoma.  -11/27/2020: PET scan show intensely hypermetabolic large soft tissue masses arising from the LEFT and RIGHT ribs. Two large lesions in the LEFT chest wall and a single large lesion in the RIGHT chest wall.Hypermetabolic expansile solid masses arising from the medial RIGHT clavicle and RIGHT inferior pubic ischium 01/09/2021: Cycle 1, Day 1 CyBorD 02/06/2021: Cycle 2, Day 1 CyBorD 03/06/2021: Cycle 3, Day 1 CyBorD 04/03/2021: Cycle 4, Day 1 CyBorD  05/01/2021: Cycle 5, Day 1 CyBorD  05/29/2021: Cycle 6, Day 1 CyBorD  06/26/2021: Cycle 7, Day 1 CyBorD  07/29/2021: Started maintenance Revlimid  5 mg once daily, 21 days on, 7 days off every 28 days.  10/2021: Decreased to maintenance Revlimid  2.5 mg once daily, 21 days on, 7 days off every 28 days.  04/2022: Decreased to maintenance Revlimid  2.5 mg every other day, 21 days on, 7 days off every 28 days.   Interval History:  Jordan Gregory 68 y.o. 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 The patient was last seen on 07/15/2023.  In the  interim, she has continued maintenance Revlimid  therapy.   On exam today Jordan Gregory reports***  MEDICAL HISTORY:  Past Medical History:  Diagnosis Date   Acute renal disease    Hypertension    Insomnia    Plasmacytoma (HCC)    Thyroid  disease    Vitamin D  deficiency     SURGICAL HISTORY: Past Surgical History:  Procedure Laterality Date   MOHS SURGERY  2016    SOCIAL HISTORY: Social History   Socioeconomic History   Marital status: Married    Spouse name: Not on file   Number of children: Not on file   Years of education: Not on file   Highest education level: Master's degree (e.g., MA, MS, MEng, MEd, MSW, MBA)  Occupational History   Not on file  Tobacco Use   Smoking status: Never   Smokeless tobacco: Never  Vaping Use   Vaping status: Never Used  Substance and Sexual Activity   Alcohol use: Yes    Alcohol/week: 5.0 standard drinks of alcohol    Types: 5 Standard drinks or equivalent per week   Drug use: No   Sexual activity: Not Currently    Partners: Male    Birth control/protection: Post-menopausal    Comment: 1st intercourse 50 yo-5 partners  Other Topics Concern   Not on file  Social History Narrative   She works at [employer].    Married - 23 years    No kids       She likes to travel, read, cycle, be outside.    Social Drivers of Corporate investment banker Strain: Low Risk  (01/01/2023)   Overall Financial Resource  Strain (CARDIA)    Difficulty of Paying Living Expenses: Not hard at all  Food Insecurity: No Food Insecurity (01/01/2023)   Hunger Vital Sign    Worried About Running Out of Food in the Last Year: Never true    Ran Out of Food in the Last Year: Never true  Transportation Needs: No Transportation Needs (01/01/2023)   PRAPARE - Administrator, Civil Service (Medical): No    Lack of Transportation (Non-Medical): No  Physical Activity: Sufficiently Active (01/01/2023)   Exercise Vital Sign    Days of Exercise  per Week: 4 days    Minutes of Exercise per Session: 40 min  Stress: Stress Concern Present (01/01/2023)   [employer] of Occupational Health - Occupational Stress Questionnaire    Feeling of Stress : To some extent  Social Connections: Moderately Isolated (01/01/2023)   Social Connection and Isolation Panel    Frequency of Communication with Friends and Family: More than three times a week    Frequency of Social Gatherings with Friends and Family: Once a week    Attends Religious Services: Never    Database administrator or Organizations: No    Attends Engineer, structural: Not on file    Marital Status: Married  Catering manager Violence: Not At Risk (12/04/2020)   Humiliation, Afraid, Rape, and Kick questionnaire    Fear of Current or Ex-Partner: No    Emotionally Abused: No    Physically Abused: No    Sexually Abused: No    FAMILY HISTORY: Family History  Problem Relation Age of Onset   Arthritis Mother    Hypertension Mother    Dementia Mother    Heart disease Father    Liver cancer Father    Asperger's syndrome Brother    Atrial fibrillation Brother     ALLERGIES:  is allergic to ramipril and velcade  [bortezomib ].  MEDICATIONS:  Current Outpatient Medications  Medication Sig Dispense Refill   aspirin EC 81 MG tablet Take 81 mg by mouth daily. Swallow whole.     calcium carbonate (OS-CAL) 600 MG TABS Take 600 mg by mouth daily.     citalopram  (CELEXA ) 10 MG tablet Take 1 tablet (10 mg total) by mouth daily. 90 tablet 3   lenalidomide  (REVLIMID ) 2.5 MG capsule Take 1 capsule (2.5 mg total) by mouth every other day. [employer] Auth # [id number]    Date Obtained 09/24/23 Take one capsule every other day for 21 days then none for 7 days 11 capsule 0   levothyroxine  (SYNTHROID ) 75 MCG tablet TAKE 1 TABLET BY MOUTH EVERY DAY BEFORE BREAKFAST 90 tablet 3   traZODone  (DESYREL ) 50 MG tablet TAKE 1/2 TO 1 TABLET BY MOUTH AT BEDTIME AS NEEDED FOR SLEEP 90 tablet 1    VITAMIN D , CHOLECALCIFEROL, PO Take 2,000 Int'l Units by mouth daily.     No current facility-administered medications for this visit.    REVIEW OF SYSTEMS:   Constitutional: ( - ) fevers, ( - )  chills , ( - ) night sweats Eyes: ( - ) blurriness of vision, ( - ) double vision, ( - ) watery eyes Ears, nose, mouth, throat, and face: ( - ) mucositis, ( - ) sore throat Respiratory: ( - ) cough, ( - ) dyspnea, ( - ) wheezes Cardiovascular: ( - ) palpitation, ( - ) chest discomfort, ( - ) lower extremity swelling Gastrointestinal:  ( - ) nausea, ( - ) heartburn, ( + ) change in  bowel habits Skin: ( - ) abnormal skin rashes Lymphatics: ( - ) new lymphadenopathy, ( - ) easy bruising Neurological: ( + ) numbness, ( - ) tingling, ( - ) new weaknesses Behavioral/Psych: ( - ) mood change, ( - ) new changes  All other systems were reviewed with the patient and are negative.  PHYSICAL EXAMINATION: ECOG PERFORMANCE STATUS: 1 - Symptomatic but completely ambulatory  There were no vitals filed for this visit.      There were no vitals filed for this visit.     GENERAL: Well-appearing middle-age Caucasian female, alert, no distress and comfortable SKIN: skin color, texture, turgor are normal, no rashes or significant lesions EYES: conjunctiva are pink and non-injected, sclera clear LUNGS: clear to auscultation and percussion with normal breathing effort HEART: regular rate & rhythm and no murmurs and no lower extremity edema Musculoskeletal: no cyanosis of digits and no clubbing  PSYCH: alert & oriented x 3, fluent speech NEURO: no focal motor/sensory deficits  LABORATORY DATA:  I have reviewed the data as listed    Latest Ref Rng & Units 09/09/2023   10:30 AM 08/12/2023   11:02 AM 07/15/2023    9:42 AM  CBC  WBC 4.0 - 10.5 K/uL 2.4  2.5  2.6   Hemoglobin 12.0 - 15.0 g/dL 85.6  85.9  85.8   Hematocrit 36.0 - 46.0 % 40.4  40.7  40.4   Platelets 150 - 400 K/uL 134  123  136         Latest Ref Rng & Units 09/09/2023   10:30 AM 08/12/2023   11:02 AM 07/15/2023    9:42 AM  CMP  Glucose 70 - 99 mg/dL 888  885  886   BUN 8 - 23 mg/dL 25  23  29    Creatinine 0.44 - 1.00 mg/dL 8.30  8.32  8.17   Sodium 135 - 145 mmol/L 138  139  137   Potassium 3.5 - 5.1 mmol/L 4.6  4.5  4.3   Chloride 98 - 111 mmol/L 107  110  107   CO2 22 - 32 mmol/L 22  22  20    Calcium 8.9 - 10.3 mg/dL 9.0  8.8  9.0   Total Protein 6.5 - 8.1 g/dL 7.2  6.7  6.9   Total Bilirubin 0.0 - 1.2 mg/dL 0.6  0.5  0.5   Alkaline Phos 38 - 126 U/L 74  69  76   AST 15 - 41 U/L 13  13  13    ALT 0 - 44 U/L 12  11  11      Lab Results  Component Value Date   MPROTEIN Not Observed 09/09/2023   MPROTEIN Not Observed 08/12/2023   MPROTEIN Not Observed 07/15/2023   Lab Results  Component Value Date   KPAFRELGTCHN 17.8 09/09/2023   KPAFRELGTCHN 19.8 (H) 08/12/2023   KPAFRELGTCHN 21.3 (H) 07/15/2023   LAMBDASER 16.4 09/09/2023   LAMBDASER 15.6 08/12/2023   LAMBDASER 19.3 07/15/2023   KAPLAMBRATIO 1.09 09/09/2023   KAPLAMBRATIO 1.27 08/12/2023   KAPLAMBRATIO 1.10 07/15/2023     RADIOGRAPHIC STUDIES: No results found.  ASSESSMENT & PLAN Tracey Hermance is a 68 y.o. 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  Previously we discussed the diagnosis and treatment options for plasmacytomas without multiple myeloma. This includes radiation therapy verus chemotherapy. Dr. Federico discussed case with Dr. Rojelio from [facility] and the recommendation was to proceed with chemotherapy  due to patients decline in renal function.  Given the patient's kidney dysfunction at this time the treatment of choice was CyBorD chemotherapy.  Patient received 7 cycles of CyBorD from 01/09/2021-06/26/2021.   Ms. Scrivens transitioned to maintenance Revlimid  5 mg PO daily starting 07/29/2021.   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   --Serological studies are confirmatory of a plasma cell neoplasm with markedly high lambda light chains and proteinuria.  --Bone marrow biopsy from 11/07/2020 showed no evidence of multiple myeloma. Right clavicle biopsy from 11/23/2020 confirmed plasma cell neoplasm consistent with plasmacytomas.  --PET scan from 11/27/2020 showed multifocal hypermetabolic soft tissue massing arising from the left and right ribs, left and right chest wall, right clavicle and right inferior pubic ischium. Largest is solid mass in the anterior left chest wall measuring 10.2 x 9.9 cm.  --Recommended to initiate systemic chemotherapy due to acute renal dysfunction.  --Received 7 cycles of CyBorD from 01/09/2021 to 06/26/2021 --Starting 07/29/2021, started maintenance therapy revlimid  5 mg PO daily 21 days on 7 days off.  Plan: --Most recent M protein was undetectable on 09/09/2023. M protein was not detected and normal serum free light chains.  --Labs today were reviewed and showed*** --continue Revlimid  dose 2.5 mg  PO every other day, 21 days on and 7 days off.  --RTC for monthly labs and toxicity check in 3 months.   #Neutropenia/Thrombocytopenia: --Secondary to revlimid   #Renal dysfunction--improved: --Secondary to damage from malignancy.  --Creatinine *** --Continue to monitor closely.   #Supportive Care -- port placement not required -- zofran  8mg  q8H PRN and compazine  10mg  PO q6H for nausea -- stool softeners/laxatives for constipation as needed  No orders of the defined types were placed in this encounter.  All questions were answered. The patient knows to call the clinic with any problems, questions or concerns.  I have spent a total of 30 minutes minutes of face-to-face and non-face-to-face time, preparing to see the patient, performing a medically appropriate examination, counseling and educating the patient, ordering medications, documenting  clinical information in the electronic health record,  and care coordination.   Johnston Police PA-C Dept of Hematology and Oncology [facility] Phone: [phone]

## 2023-10-07 ENCOUNTER — Encounter: Payer: Self-pay | Admitting: Oncology

## 2023-10-07 ENCOUNTER — Inpatient Hospital Stay: Attending: Hematology and Oncology

## 2023-10-07 ENCOUNTER — Inpatient Hospital Stay: Admitting: Physician Assistant

## 2023-10-07 VITALS — BP 140/72 | HR 63 | Temp 97.3°F | Resp 13 | Wt 185.6 lb

## 2023-10-07 DIAGNOSIS — C9001 Multiple myeloma in remission: Secondary | ICD-10-CM

## 2023-10-07 DIAGNOSIS — Z800 Family history of malignant neoplasm of digestive organs: Secondary | ICD-10-CM | POA: Insufficient documentation

## 2023-10-07 DIAGNOSIS — C9030 Solitary plasmacytoma not having achieved remission: Secondary | ICD-10-CM | POA: Diagnosis present

## 2023-10-07 DIAGNOSIS — D696 Thrombocytopenia, unspecified: Secondary | ICD-10-CM | POA: Insufficient documentation

## 2023-10-07 DIAGNOSIS — D709 Neutropenia, unspecified: Secondary | ICD-10-CM | POA: Insufficient documentation

## 2023-10-07 DIAGNOSIS — N289 Disorder of kidney and ureter, unspecified: Secondary | ICD-10-CM | POA: Diagnosis not present

## 2023-10-07 LAB — CMP (CANCER CENTER ONLY)
ALT: 12 U/L (ref 0–44)
AST: 14 U/L — ABNORMAL LOW (ref 15–41)
Albumin: 4.5 g/dL (ref 3.5–5.0)
Alkaline Phosphatase: 78 U/L (ref 38–126)
Anion gap: 6 (ref 5–15)
BUN: 28 mg/dL — ABNORMAL HIGH (ref 8–23)
CO2: 24 mmol/L (ref 22–32)
Calcium: 9.3 mg/dL (ref 8.9–10.3)
Chloride: 110 mmol/L (ref 98–111)
Creatinine: 1.67 mg/dL — ABNORMAL HIGH (ref 0.44–1.00)
GFR, Estimated: 33 mL/min — ABNORMAL LOW (ref >60)
Glucose, Bld: 107 mg/dL — ABNORMAL HIGH (ref 70–99)
Potassium: 4.9 mmol/L (ref 3.5–5.1)
Sodium: 140 mmol/L (ref 135–145)
Total Bilirubin: 0.5 mg/dL (ref 0.0–1.2)
Total Protein: 7.2 g/dL (ref 6.5–8.1)

## 2023-10-07 LAB — CBC WITH DIFFERENTIAL (CANCER CENTER ONLY)
Abs Immature Granulocytes: 0.01 K/uL (ref 0.00–0.07)
Basophils Absolute: 0.1 K/uL (ref 0.0–0.1)
Basophils Relative: 3 %
Eosinophils Absolute: 0.1 K/uL (ref 0.0–0.5)
Eosinophils Relative: 4 %
HCT: 42.6 % (ref 36.0–46.0)
Hemoglobin: 14.8 g/dL (ref 12.0–15.0)
Immature Granulocytes: 0 %
Lymphocytes Relative: 32 %
Lymphs Abs: 0.8 K/uL (ref 0.7–4.0)
MCH: 31.2 pg (ref 26.0–34.0)
MCHC: 34.7 g/dL (ref 30.0–36.0)
MCV: 89.7 fL (ref 80.0–100.0)
Monocytes Absolute: 0.2 K/uL (ref 0.1–1.0)
Monocytes Relative: 9 %
Neutro Abs: 1.3 K/uL — ABNORMAL LOW (ref 1.7–7.7)
Neutrophils Relative %: 52 %
Platelet Count: 149 K/uL — ABNORMAL LOW (ref 150–400)
RBC: 4.75 MIL/uL (ref 3.87–5.11)
RDW: 14.1 % (ref 11.5–15.5)
WBC Count: 2.5 K/uL — ABNORMAL LOW (ref 4.0–10.5)
nRBC: 0 % (ref 0.0–0.2)

## 2023-10-07 LAB — LACTATE DEHYDROGENASE: LDH: 278 U/L — ABNORMAL HIGH (ref 98–192)

## 2023-10-08 LAB — KAPPA/LAMBDA LIGHT CHAINS
Kappa free light chain: 18.6 mg/L (ref 3.3–19.4)
Kappa, lambda light chain ratio: 1.08 (ref 0.26–1.65)
Lambda free light chains: 17.3 mg/L (ref 5.7–26.3)

## 2023-10-12 LAB — MULTIPLE MYELOMA PANEL, SERUM
Albumin SerPl Elph-Mcnc: 3.8 g/dL (ref 2.9–4.4)
Albumin/Glob SerPl: 1.4 (ref 0.7–1.7)
Alpha 1: 0.2 g/dL (ref 0.0–0.4)
Alpha2 Glob SerPl Elph-Mcnc: 0.7 g/dL (ref 0.4–1.0)
B-Globulin SerPl Elph-Mcnc: 1 g/dL (ref 0.7–1.3)
Gamma Glob SerPl Elph-Mcnc: 0.9 g/dL (ref 0.4–1.8)
Globulin, Total: 2.8 g/dL (ref 2.2–3.9)
IgA: 63 mg/dL — ABNORMAL LOW (ref 87–352)
IgG (Immunoglobin G), Serum: 955 mg/dL (ref 586–1602)
IgM (Immunoglobulin M), Srm: 44 mg/dL (ref 26–217)
Total Protein ELP: 6.6 g/dL (ref 6.0–8.5)

## 2023-10-20 ENCOUNTER — Other Ambulatory Visit: Payer: Self-pay | Admitting: Hematology and Oncology

## 2023-10-20 ENCOUNTER — Other Ambulatory Visit: Payer: Self-pay

## 2023-10-20 DIAGNOSIS — C9000 Multiple myeloma not having achieved remission: Secondary | ICD-10-CM

## 2023-10-20 MED ORDER — LENALIDOMIDE 2.5 MG PO CAPS: 2.5000 mg | ORAL_CAPSULE | ORAL | 0 refills | Status: DC

## 2023-10-21 ENCOUNTER — Encounter: Payer: Self-pay | Admitting: Hematology and Oncology

## 2023-10-22 ENCOUNTER — Other Ambulatory Visit: Payer: Self-pay | Admitting: *Deleted

## 2023-10-22 DIAGNOSIS — C9000 Multiple myeloma not having achieved remission: Secondary | ICD-10-CM

## 2023-10-22 DIAGNOSIS — R7989 Other specified abnormal findings of blood chemistry: Secondary | ICD-10-CM

## 2023-11-04 ENCOUNTER — Inpatient Hospital Stay

## 2023-11-13 ENCOUNTER — Inpatient Hospital Stay: Attending: Hematology and Oncology

## 2023-11-13 DIAGNOSIS — C9001 Multiple myeloma in remission: Secondary | ICD-10-CM

## 2023-11-13 DIAGNOSIS — C9030 Solitary plasmacytoma not having achieved remission: Secondary | ICD-10-CM | POA: Insufficient documentation

## 2023-11-13 DIAGNOSIS — R7989 Other specified abnormal findings of blood chemistry: Secondary | ICD-10-CM

## 2023-11-13 DIAGNOSIS — C9000 Multiple myeloma not having achieved remission: Secondary | ICD-10-CM

## 2023-11-13 LAB — CBC WITH DIFFERENTIAL (CANCER CENTER ONLY)
Abs Immature Granulocytes: 0.01 K/uL (ref 0.00–0.07)
Basophils Absolute: 0 K/uL (ref 0.0–0.1)
Basophils Relative: 1 %
Eosinophils Absolute: 0.1 K/uL (ref 0.0–0.5)
Eosinophils Relative: 4 %
HCT: 39.3 % (ref 36.0–46.0)
Hemoglobin: 13.9 g/dL (ref 12.0–15.0)
Immature Granulocytes: 0 %
Lymphocytes Relative: 27 %
Lymphs Abs: 0.6 K/uL — ABNORMAL LOW (ref 0.7–4.0)
MCH: 31 pg (ref 26.0–34.0)
MCHC: 35.4 g/dL (ref 30.0–36.0)
MCV: 87.7 fL (ref 80.0–100.0)
Monocytes Absolute: 0.2 K/uL (ref 0.1–1.0)
Monocytes Relative: 10 %
Neutro Abs: 1.3 K/uL — ABNORMAL LOW (ref 1.7–7.7)
Neutrophils Relative %: 58 %
Platelet Count: 154 K/uL (ref 150–400)
RBC: 4.48 MIL/uL (ref 3.87–5.11)
RDW: 13.9 % (ref 11.5–15.5)
WBC Count: 2.3 K/uL — ABNORMAL LOW (ref 4.0–10.5)
nRBC: 0 % (ref 0.0–0.2)

## 2023-11-13 LAB — CMP (CANCER CENTER ONLY)
ALT: 9 U/L (ref 0–44)
AST: 13 U/L — ABNORMAL LOW (ref 15–41)
Albumin: 4.1 g/dL (ref 3.5–5.0)
Alkaline Phosphatase: 69 U/L (ref 38–126)
Anion gap: 9 (ref 5–15)
BUN: 25 mg/dL — ABNORMAL HIGH (ref 8–23)
CO2: 18 mmol/L — ABNORMAL LOW (ref 22–32)
Calcium: 8.5 mg/dL — ABNORMAL LOW (ref 8.9–10.3)
Chloride: 112 mmol/L — ABNORMAL HIGH (ref 98–111)
Creatinine: 1.62 mg/dL — ABNORMAL HIGH (ref 0.44–1.00)
GFR, Estimated: 34 mL/min — ABNORMAL LOW (ref >60)
Glucose, Bld: 109 mg/dL — ABNORMAL HIGH (ref 70–99)
Potassium: 3.9 mmol/L (ref 3.5–5.1)
Sodium: 139 mmol/L (ref 135–145)
Total Bilirubin: 0.4 mg/dL (ref 0.0–1.2)
Total Protein: 6.7 g/dL (ref 6.5–8.1)

## 2023-11-13 LAB — LACTATE DEHYDROGENASE: LDH: 147 U/L (ref 98–192)

## 2023-11-13 LAB — PHOSPHORUS: Phosphorus: 2.8 mg/dL (ref 2.5–4.6)

## 2023-11-14 LAB — PTH, INTACT AND CALCIUM
Calcium, Total (PTH): 8.3 mg/dL — ABNORMAL LOW (ref 8.7–10.3)
PTH: 84 pg/mL — ABNORMAL HIGH (ref 15–65)

## 2023-11-16 ENCOUNTER — Other Ambulatory Visit: Payer: Self-pay | Admitting: *Deleted

## 2023-11-16 ENCOUNTER — Other Ambulatory Visit: Payer: Self-pay | Admitting: Hematology and Oncology

## 2023-11-16 DIAGNOSIS — C9000 Multiple myeloma not having achieved remission: Secondary | ICD-10-CM

## 2023-11-16 LAB — KAPPA/LAMBDA LIGHT CHAINS
Kappa free light chain: 18.8 mg/L (ref 3.3–19.4)
Kappa, lambda light chain ratio: 0.98 (ref 0.26–1.65)
Lambda free light chains: 19.2 mg/L (ref 5.7–26.3)

## 2023-11-16 MED ORDER — LENALIDOMIDE 2.5 MG PO CAPS: 2.5000 mg | ORAL_CAPSULE | ORAL | 0 refills | Status: DC

## 2023-11-17 LAB — HM MAMMOGRAPHY

## 2023-11-18 ENCOUNTER — Encounter: Payer: Self-pay | Admitting: Adult Health

## 2023-11-19 LAB — MULTIPLE MYELOMA PANEL, SERUM
Albumin SerPl Elph-Mcnc: 3.6 g/dL (ref 2.9–4.4)
Albumin/Glob SerPl: 1.3 (ref 0.7–1.7)
Alpha 1: 0.3 g/dL (ref 0.0–0.4)
Alpha2 Glob SerPl Elph-Mcnc: 0.8 g/dL (ref 0.4–1.0)
B-Globulin SerPl Elph-Mcnc: 0.9 g/dL (ref 0.7–1.3)
Gamma Glob SerPl Elph-Mcnc: 0.9 g/dL (ref 0.4–1.8)
Globulin, Total: 2.9 g/dL (ref 2.2–3.9)
IgA: 62 mg/dL — ABNORMAL LOW (ref 87–352)
IgG (Immunoglobin G), Serum: 928 mg/dL (ref 586–1602)
IgM (Immunoglobulin M), Srm: 37 mg/dL (ref 26–217)
Total Protein ELP: 6.5 g/dL (ref 6.0–8.5)

## 2023-12-02 ENCOUNTER — Inpatient Hospital Stay

## 2023-12-02 DIAGNOSIS — C9030 Solitary plasmacytoma not having achieved remission: Secondary | ICD-10-CM | POA: Diagnosis not present

## 2023-12-02 DIAGNOSIS — C9001 Multiple myeloma in remission: Secondary | ICD-10-CM

## 2023-12-02 LAB — CMP (CANCER CENTER ONLY)
ALT: 12 U/L (ref 0–44)
AST: 17 U/L (ref 15–41)
Albumin: 4.4 g/dL (ref 3.5–5.0)
Alkaline Phosphatase: 79 U/L (ref 38–126)
Anion gap: 12 (ref 5–15)
BUN: 27 mg/dL — ABNORMAL HIGH (ref 8–23)
CO2: 19 mmol/L — ABNORMAL LOW (ref 22–32)
Calcium: 9.1 mg/dL (ref 8.9–10.3)
Chloride: 107 mmol/L (ref 98–111)
Creatinine: 1.61 mg/dL — ABNORMAL HIGH (ref 0.44–1.00)
GFR, Estimated: 34 mL/min — ABNORMAL LOW (ref >60)
Glucose, Bld: 109 mg/dL — ABNORMAL HIGH (ref 70–99)
Potassium: 4.7 mmol/L (ref 3.5–5.1)
Sodium: 138 mmol/L (ref 135–145)
Total Bilirubin: 0.4 mg/dL (ref 0.0–1.2)
Total Protein: 7.1 g/dL (ref 6.5–8.1)

## 2023-12-02 LAB — CBC WITH DIFFERENTIAL (CANCER CENTER ONLY)
Abs Immature Granulocytes: 0.01 K/uL (ref 0.00–0.07)
Basophils Absolute: 0.1 K/uL (ref 0.0–0.1)
Basophils Relative: 2 %
Eosinophils Absolute: 0.1 K/uL (ref 0.0–0.5)
Eosinophils Relative: 5 %
HCT: 40.8 % (ref 36.0–46.0)
Hemoglobin: 14 g/dL (ref 12.0–15.0)
Immature Granulocytes: 0 %
Lymphocytes Relative: 33 %
Lymphs Abs: 0.9 K/uL (ref 0.7–4.0)
MCH: 30.8 pg (ref 26.0–34.0)
MCHC: 34.3 g/dL (ref 30.0–36.0)
MCV: 89.9 fL (ref 80.0–100.0)
Monocytes Absolute: 0.3 K/uL (ref 0.1–1.0)
Monocytes Relative: 10 %
Neutro Abs: 1.3 K/uL — ABNORMAL LOW (ref 1.7–7.7)
Neutrophils Relative %: 50 %
Platelet Count: 145 K/uL — ABNORMAL LOW (ref 150–400)
RBC: 4.54 MIL/uL (ref 3.87–5.11)
RDW: 13.7 % (ref 11.5–15.5)
WBC Count: 2.6 K/uL — ABNORMAL LOW (ref 4.0–10.5)
nRBC: 0 % (ref 0.0–0.2)

## 2023-12-02 LAB — LACTATE DEHYDROGENASE: LDH: 146 U/L (ref 105–235)

## 2023-12-03 LAB — KAPPA/LAMBDA LIGHT CHAINS
Kappa free light chain: 25.5 mg/L — ABNORMAL HIGH (ref 3.3–19.4)
Kappa, lambda light chain ratio: 1.29 (ref 0.26–1.65)
Lambda free light chains: 19.8 mg/L (ref 5.7–26.3)

## 2023-12-06 LAB — MULTIPLE MYELOMA PANEL, SERUM
Albumin SerPl Elph-Mcnc: 3.7 g/dL (ref 2.9–4.4)
Albumin/Glob SerPl: 1.4 (ref 0.7–1.7)
Alpha 1: 0.2 g/dL (ref 0.0–0.4)
Alpha2 Glob SerPl Elph-Mcnc: 0.7 g/dL (ref 0.4–1.0)
B-Globulin SerPl Elph-Mcnc: 0.9 g/dL (ref 0.7–1.3)
Gamma Glob SerPl Elph-Mcnc: 0.9 g/dL (ref 0.4–1.8)
Globulin, Total: 2.7 g/dL (ref 2.2–3.9)
IgA: 87 mg/dL (ref 87–352)
IgG (Immunoglobin G), Serum: 990 mg/dL (ref 586–1602)
IgM (Immunoglobulin M), Srm: 47 mg/dL (ref 26–217)
Total Protein ELP: 6.4 g/dL (ref 6.0–8.5)

## 2023-12-09 ENCOUNTER — Other Ambulatory Visit: Payer: Self-pay | Admitting: Adult Health

## 2023-12-09 DIAGNOSIS — E039 Hypothyroidism, unspecified: Secondary | ICD-10-CM

## 2023-12-09 DIAGNOSIS — F419 Anxiety disorder, unspecified: Secondary | ICD-10-CM

## 2023-12-09 DIAGNOSIS — C9030 Solitary plasmacytoma not having achieved remission: Secondary | ICD-10-CM

## 2023-12-22 ENCOUNTER — Other Ambulatory Visit: Payer: Self-pay | Admitting: Hematology and Oncology

## 2023-12-22 DIAGNOSIS — C9000 Multiple myeloma not having achieved remission: Secondary | ICD-10-CM

## 2023-12-23 ENCOUNTER — Other Ambulatory Visit: Payer: Self-pay | Admitting: *Deleted

## 2023-12-23 DIAGNOSIS — C9000 Multiple myeloma not having achieved remission: Secondary | ICD-10-CM

## 2023-12-23 MED ORDER — LENALIDOMIDE 2.5 MG PO CAPS: 2.5000 mg | ORAL_CAPSULE | ORAL | 0 refills | Status: DC

## 2023-12-30 ENCOUNTER — Inpatient Hospital Stay: Attending: Hematology and Oncology

## 2023-12-30 ENCOUNTER — Inpatient Hospital Stay: Admitting: Hematology and Oncology

## 2023-12-30 VITALS — BP 134/75 | HR 73 | Temp 97.2°F | Resp 20 | Wt 187.5 lb

## 2023-12-30 DIAGNOSIS — D696 Thrombocytopenia, unspecified: Secondary | ICD-10-CM | POA: Insufficient documentation

## 2023-12-30 DIAGNOSIS — D649 Anemia, unspecified: Secondary | ICD-10-CM

## 2023-12-30 DIAGNOSIS — N289 Disorder of kidney and ureter, unspecified: Secondary | ICD-10-CM | POA: Insufficient documentation

## 2023-12-30 DIAGNOSIS — C9001 Multiple myeloma in remission: Secondary | ICD-10-CM | POA: Diagnosis not present

## 2023-12-30 DIAGNOSIS — C9030 Solitary plasmacytoma not having achieved remission: Secondary | ICD-10-CM | POA: Insufficient documentation

## 2023-12-30 DIAGNOSIS — D709 Neutropenia, unspecified: Secondary | ICD-10-CM | POA: Insufficient documentation

## 2023-12-30 LAB — CBC WITH DIFFERENTIAL (CANCER CENTER ONLY)
Abs Immature Granulocytes: 0.01 K/uL (ref 0.00–0.07)
Basophils Absolute: 0.1 K/uL (ref 0.0–0.1)
Basophils Relative: 3 %
Eosinophils Absolute: 0.1 K/uL (ref 0.0–0.5)
Eosinophils Relative: 6 %
HCT: 39.8 % (ref 36.0–46.0)
Hemoglobin: 13.7 g/dL (ref 12.0–15.0)
Immature Granulocytes: 0 %
Lymphocytes Relative: 30 %
Lymphs Abs: 0.8 K/uL (ref 0.7–4.0)
MCH: 30.8 pg (ref 26.0–34.0)
MCHC: 34.4 g/dL (ref 30.0–36.0)
MCV: 89.4 fL (ref 80.0–100.0)
Monocytes Absolute: 0.2 K/uL (ref 0.1–1.0)
Monocytes Relative: 9 %
Neutro Abs: 1.4 K/uL — ABNORMAL LOW (ref 1.7–7.7)
Neutrophils Relative %: 52 %
Platelet Count: 135 K/uL — ABNORMAL LOW (ref 150–400)
RBC: 4.45 MIL/uL (ref 3.87–5.11)
RDW: 14.2 % (ref 11.5–15.5)
WBC Count: 2.6 K/uL — ABNORMAL LOW (ref 4.0–10.5)
nRBC: 0 % (ref 0.0–0.2)

## 2023-12-30 LAB — LACTATE DEHYDROGENASE: LDH: 150 U/L (ref 105–235)

## 2023-12-30 LAB — CMP (CANCER CENTER ONLY)
ALT: 11 U/L (ref 0–44)
AST: 18 U/L (ref 15–41)
Albumin: 4.3 g/dL (ref 3.5–5.0)
Alkaline Phosphatase: 76 U/L (ref 38–126)
Anion gap: 13 (ref 5–15)
BUN: 26 mg/dL — ABNORMAL HIGH (ref 8–23)
CO2: 18 mmol/L — ABNORMAL LOW (ref 22–32)
Calcium: 8.9 mg/dL (ref 8.9–10.3)
Chloride: 106 mmol/L (ref 98–111)
Creatinine: 1.71 mg/dL — ABNORMAL HIGH (ref 0.44–1.00)
GFR, Estimated: 32 mL/min — ABNORMAL LOW
Glucose, Bld: 138 mg/dL — ABNORMAL HIGH (ref 70–99)
Potassium: 4.4 mmol/L (ref 3.5–5.1)
Sodium: 138 mmol/L (ref 135–145)
Total Bilirubin: 0.4 mg/dL (ref 0.0–1.2)
Total Protein: 7 g/dL (ref 6.5–8.1)

## 2023-12-30 NOTE — Progress Notes (Signed)
 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  Serological testing concerning for a plasma cell neoplasm, with UPEP showing 7.4 g of protein daily with markedly high lambda light chains -11/07/2020: Bone marrow biopsy performed, shows no evidence of multiple myeloma -11/23/2020: Biopsy of right clavicle mass confirmed plasmacytoma.  -11/27/2020: PET scan show intensely hypermetabolic large soft tissue masses arising from the LEFT and RIGHT ribs. Two large lesions in the LEFT chest wall and a single large lesion in the RIGHT chest wall.Hypermetabolic expansile solid masses arising from the medial RIGHT clavicle and RIGHT inferior pubic ischium 01/09/2021: Cycle 1, Day 1 CyBorD 02/06/2021: Cycle 2, Day 1 CyBorD 03/06/2021: Cycle 3, Day 1 CyBorD 04/03/2021: Cycle 4, Day 1 CyBorD  05/01/2021: Cycle 5, Day 1 CyBorD  05/29/2021: Cycle 6, Day 1 CyBorD  06/26/2021: Cycle 7, Day 1 CyBorD  07/29/2021: Started maintenance Revlimid  5 mg once daily, 21 days on, 7 days off every 28 days.  10/2021: Decreased to maintenance Revlimid  2.5 mg once daily, 21 days on, 7 days off every 28 days.  04/2022: Decreased to maintenance Revlimid  2.5 mg every other day, 21 days on, 7 days off every 28 days.   Interval History:  Jordan Gregory 68 y.o. 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 The patient was last seen on 10/07/2023.  In the  interim, she has continued maintenance Revlimid  therapy.   On exam today Jordan Gregory reports she will be staying local for the holiday.  She reports that she has been feeling well overall in the interim since her last visit in October.  [place]  Additionally she had a trip to [place].  She reports she did very well with no recent issues with fevers, chills, sweats, nausea, vomiting or diarrhea.  She has received her flu shot but did not get her COVID shot.  She reports she is taking her Revlimid  as prescribed and has not noticed any side effects.  She is not having any redness, itching, or rash.  She is doing her best to try to walk more.  Overall she feels well and has no questions concerns or complaints today.  A full 10 point ROS is otherwise negative.  MEDICAL HISTORY:  Past Medical History:  Diagnosis Date   Acute renal disease    Hypertension    Insomnia    Plasmacytoma (HCC)    Thyroid  disease    Vitamin D  deficiency     SURGICAL HISTORY: Past Surgical History:  Procedure Laterality Date   MOHS SURGERY  2016    SOCIAL HISTORY: Social History   Socioeconomic History   Marital status: Married    Spouse name: Not on file   Number of children: Not on file   Years of education: Not on file   Highest education level: Master's degree (e.g., MA, MS, MEng, MEd, MSW, MBA)  Occupational History   Not on file  Tobacco Use   Smoking status:  Never   Smokeless tobacco: Never  Vaping Use   Vaping status: Never Used  Substance and Sexual Activity   Alcohol use: Yes    Alcohol/week: 5.0 standard drinks of alcohol    Types: 5 Standard drinks or equivalent per week   Drug use: No   Sexual activity: Not Currently    Partners: Male    Birth control/protection: Post-menopausal    Comment: 1st intercourse 63 yo-5 partners  Other Topics Concern   Not on file  Social History Narrative   She works at [employer] and  [employer].    Married - 23 years    No kids       She likes to travel, read, cycle, be outside.    Social Drivers of Health   Tobacco Use: Low Risk (04/02/2023)   Patient History    Smoking Tobacco Use: Never    Smokeless Tobacco Use: Never    Passive Exposure: Not on file  Financial Resource Strain: Low Risk (01/01/2023)   Overall Financial Resource Strain (CARDIA)    Difficulty of Paying Living Expenses: Not hard at all  Food Insecurity: No Food Insecurity (01/01/2023)   Hunger Vital Sign    Worried About Running Out of Food in the Last Year: Never true    Ran Out of Food in the Last Year: Never true  Transportation Needs: No Transportation Needs (01/01/2023)   PRAPARE - Administrator, Civil Service (Medical): No    Lack of Transportation (Non-Medical): No  Physical Activity: Sufficiently Active (01/01/2023)   Exercise Vital Sign    Days of Exercise per Week: 4 days    Minutes of Exercise per Session: 40 min  Stress: Stress Concern Present (01/01/2023)   [employer] of Occupational Health - Occupational Stress Questionnaire    Feeling of Stress : To some extent  Social Connections: Moderately Isolated (01/01/2023)   Social Connection and Isolation Panel    Frequency of Communication with Friends and Family: More than three times a week    Frequency of Social Gatherings with Friends and Family: Once a week    Attends Religious Services: Never    Database Administrator or Organizations: No    Attends Engineer, Structural: Not on file    Marital Status: Married  Catering Manager Violence: Not on file  Depression (PHQ2-9): Low Risk (04/02/2023)   Depression (PHQ2-9)    PHQ-2 Score: 1  Alcohol Screen: Low Risk (01/01/2023)   Alcohol Screen    Last Alcohol Screening Score (AUDIT): 4  Housing: Unknown (01/01/2023)   Housing Stability Vital Sign    Unable to Pay for Housing in the Last Year: No    Number of Times Moved in the Last Year: Not on file     Homeless in the Last Year: No  Utilities: Not on file  Health Literacy: Not on file    FAMILY HISTORY: Family History  Problem Relation Age of Onset   Arthritis Mother    Hypertension Mother    Dementia Mother    Heart disease Father    Liver cancer Father    Asperger's syndrome Brother    Atrial fibrillation Brother     ALLERGIES:  is allergic to ramipril and velcade  [bortezomib ].  MEDICATIONS:  Current Outpatient Medications  Medication Sig Dispense Refill   aspirin EC 81 MG tablet Take 81 mg by mouth daily. Swallow whole.     calcium carbonate (OS-CAL) 600 MG TABS Take 600 mg by mouth daily.  citalopram  (CELEXA ) 10 MG tablet TAKE 1 TABLET BY MOUTH EVERY DAY 90 tablet 3   lenalidomide  (REVLIMID ) 2.5 MG capsule Take 1 capsule (2.5 mg total) by mouth every other day. Auth # [id number] Date Obtained 12/23/23 Take one capsule every other day for 21 days then none for 7 days. 11 capsule 0   levothyroxine  (SYNTHROID ) 75 MCG tablet TAKE 1 TABLET BY MOUTH EVERY DAY BEFORE BREAKFAST 90 tablet 3   traZODone  (DESYREL ) 50 MG tablet TAKE 1/2 TO 1 TABLET BY MOUTH AT BEDTIME AS NEEDED FOR SLEEP 90 tablet 1   VITAMIN D , CHOLECALCIFEROL, PO Take 2,000 Int'l Units by mouth daily.     No current facility-administered medications for this visit.    REVIEW OF SYSTEMS:   Constitutional: ( - ) fevers, ( - )  chills , ( - ) night sweats Eyes: ( - ) blurriness of vision, ( - ) double vision, ( - ) watery eyes Ears, nose, mouth, throat, and face: ( - ) mucositis, ( - ) sore throat Respiratory: ( - ) cough, ( - ) dyspnea, ( - ) wheezes Cardiovascular: ( - ) palpitation, ( - ) chest discomfort, ( - ) lower extremity swelling Gastrointestinal:  ( - ) nausea, ( - ) heartburn, ( - ) change in bowel habits Skin: ( - ) abnormal skin rashes Lymphatics: ( - ) new lymphadenopathy, ( - ) easy bruising Neurological: ( - ) numbness, ( - ) tingling, ( - ) new weaknesses Behavioral/Psych: ( - ) mood change, ( -  ) new changes  All other systems were reviewed with the patient and are negative.  PHYSICAL EXAMINATION: ECOG PERFORMANCE STATUS: 1 - Symptomatic but completely ambulatory  There were no vitals filed for this visit.       There were no vitals filed for this visit.      GENERAL: Well-appearing middle-age Caucasian female, alert, no distress and comfortable SKIN: skin color, texture, turgor are normal, no rashes or significant lesions EYES: conjunctiva are pink and non-injected, sclera clear LUNGS: clear to auscultation and percussion with normal breathing effort HEART: regular rate & rhythm and no murmurs and no lower extremity edema Musculoskeletal: no cyanosis of digits and no clubbing  PSYCH: alert & oriented x 3, fluent speech NEURO: no focal motor/sensory deficits  LABORATORY DATA:  I have reviewed the data as listed    Latest Ref Rng & Units 12/02/2023   10:31 AM 11/13/2023   10:24 AM 10/07/2023   10:37 AM  CBC  WBC 4.0 - 10.5 K/uL 2.6  2.3  2.5   Hemoglobin 12.0 - 15.0 g/dL 85.9  86.0  85.1   Hematocrit 36.0 - 46.0 % 40.8  39.3  42.6   Platelets 150 - 400 K/uL 145  154  149        Latest Ref Rng & Units 12/02/2023   10:31 AM 11/13/2023   10:24 AM 10/07/2023   10:37 AM  CMP  Glucose 70 - 99 mg/dL 890  890  892   BUN 8 - 23 mg/dL 27  25  28    Creatinine 0.44 - 1.00 mg/dL 8.38  8.37  8.32   Sodium 135 - 145 mmol/L 138  139  140   Potassium 3.5 - 5.1 mmol/L 4.7  3.9  4.9   Chloride 98 - 111 mmol/L 107  112  110   CO2 22 - 32 mmol/L 19  18  24    Calcium 8.9 - 10.3 mg/dL 9.1  8.5  8.3  9.3   Total Protein 6.5 - 8.1 g/dL 7.1  6.7  7.2   Total Bilirubin 0.0 - 1.2 mg/dL 0.4  0.4  0.5   Alkaline Phos 38 - 126 U/L 79  69  78   AST 15 - 41 U/L 17  13  14    ALT 0 - 44 U/L 12  9  12      Lab Results  Component Value Date   MPROTEIN Not Observed 12/02/2023   MPROTEIN Not Observed 11/13/2023   MPROTEIN Not Observed 10/07/2023   Lab Results  Component Value  Date   KPAFRELGTCHN 25.5 (H) 12/02/2023   KPAFRELGTCHN 18.8 11/13/2023   KPAFRELGTCHN 18.6 10/07/2023   LAMBDASER 19.8 12/02/2023   LAMBDASER 19.2 11/13/2023   LAMBDASER 17.3 10/07/2023   KAPLAMBRATIO 1.29 12/02/2023   KAPLAMBRATIO 0.98 11/13/2023   KAPLAMBRATIO 1.08 10/07/2023     RADIOGRAPHIC STUDIES: No results found.  ASSESSMENT & PLAN Jordan Gregory is a 68 y.o. 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  Previously we discussed the diagnosis and treatment options for plasmacytomas without multiple myeloma. This includes radiation therapy verus chemotherapy. Dr. Federico discussed case with Dr. Rojelio from [facility] and the recommendation was to proceed with chemotherapy due to patients decline in renal function.  Given the patient's kidney dysfunction at this time the treatment of choice was CyBorD chemotherapy.  Patient received 7 cycles of CyBorD from 01/09/2021-06/26/2021.   Ms. Mccrystal transitioned to maintenance Revlimid  5 mg PO daily starting 07/29/2021.   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   --Serological studies are confirmatory of a plasma cell neoplasm with markedly high lambda light chains and proteinuria.  --Bone marrow biopsy from 11/07/2020 showed no evidence of multiple myeloma. Right clavicle biopsy from 11/23/2020 confirmed plasma cell neoplasm consistent with plasmacytomas.  --PET scan from 11/27/2020 showed multifocal hypermetabolic soft tissue massing arising from the left and right ribs, left and right chest wall, right clavicle and right inferior pubic ischium. Largest is solid mass in the anterior left chest wall measuring 10.2 x 9.9 cm.  --Recommended to initiate systemic chemotherapy due to acute renal dysfunction.  --Received 7 cycles of CyBorD from 01/09/2021 to 06/26/2021 --Starting 07/29/2021,  started maintenance therapy revlimid  5 mg PO daily 21 days on 7 days off.  Plan: --Most recent M protein was undetectable on 09/09/2023. M protein was not detected and normal serum free light chains.  --Labs today were reviewed and showed WBC 2.6, Hgb 13.7, MCV 89.4, Plt 135, calcium normal. Cr 1.71  --continue Revlimid  dose 2.5 mg  PO every other day, 21 days on and 7 days off.  --RTC for monthly labs and toxicity check in 3 months.   #Neutropenia/Thrombocytopenia: --Secondary to revlimid   #Renal dysfunction--improved: --Secondary to damage from malignancy.  --Creatinine stable at 1.71.  --Continue to monitor closely.   #Supportive Care -- port placement not required -- zofran  8mg  q8H PRN and compazine  10mg  PO q6H for nausea -- stool softeners/laxatives for constipation as needed  No orders of the defined types were placed in this encounter.  All questions were answered. The patient knows to call the clinic with any problems, questions or concerns.  I have spent a total of 30 minutes minutes of face-to-face and non-face-to-face time, preparing to see the patient, performing a medically appropriate examination, counseling and educating the patient, ordering medications, documenting clinical information in  the electronic health record,  and care coordination.   Norleen IVAR Kidney, MD Department of Hematology/Oncology [facility] at [facility] Phone: [phone] Pager: [phone] Email: [email or web address]     "

## 2024-01-01 LAB — KAPPA/LAMBDA LIGHT CHAINS
Kappa free light chain: 20.3 mg/L — ABNORMAL HIGH (ref 3.3–19.4)
Kappa, lambda light chain ratio: 1.19 (ref 0.26–1.65)
Lambda free light chains: 17 mg/L (ref 5.7–26.3)

## 2024-01-05 LAB — MULTIPLE MYELOMA PANEL, SERUM
Albumin SerPl Elph-Mcnc: 3.6 g/dL (ref 2.9–4.4)
Albumin/Glob SerPl: 1.3 (ref 0.7–1.7)
Alpha 1: 0.2 g/dL (ref 0.0–0.4)
Alpha2 Glob SerPl Elph-Mcnc: 0.7 g/dL (ref 0.4–1.0)
B-Globulin SerPl Elph-Mcnc: 0.9 g/dL (ref 0.7–1.3)
Gamma Glob SerPl Elph-Mcnc: 1 g/dL (ref 0.4–1.8)
Globulin, Total: 2.9 g/dL (ref 2.2–3.9)
IgA: 67 mg/dL — ABNORMAL LOW (ref 87–352)
IgG (Immunoglobin G), Serum: 954 mg/dL (ref 586–1602)
IgM (Immunoglobulin M), Srm: 48 mg/dL (ref 26–217)
Total Protein ELP: 6.5 g/dL (ref 6.0–8.5)

## 2024-01-18 ENCOUNTER — Other Ambulatory Visit: Payer: Self-pay | Admitting: *Deleted

## 2024-01-18 ENCOUNTER — Other Ambulatory Visit: Payer: Self-pay | Admitting: Hematology and Oncology

## 2024-01-18 DIAGNOSIS — C9000 Multiple myeloma not having achieved remission: Secondary | ICD-10-CM

## 2024-01-18 MED ORDER — LENALIDOMIDE 2.5 MG PO CAPS: 2.5000 mg | ORAL_CAPSULE | ORAL | 0 refills | Status: DC

## 2024-01-22 ENCOUNTER — Telehealth: Payer: Self-pay | Admitting: Hematology and Oncology

## 2024-01-22 NOTE — Telephone Encounter (Signed)
 Pt called to move future appts

## 2024-01-27 ENCOUNTER — Inpatient Hospital Stay: Attending: Hematology and Oncology

## 2024-01-27 DIAGNOSIS — C9030 Solitary plasmacytoma not having achieved remission: Secondary | ICD-10-CM | POA: Insufficient documentation

## 2024-01-27 DIAGNOSIS — C9001 Multiple myeloma in remission: Secondary | ICD-10-CM

## 2024-01-27 LAB — CBC WITH DIFFERENTIAL (CANCER CENTER ONLY)
Abs Immature Granulocytes: 0.01 K/uL (ref 0.00–0.07)
Basophils Absolute: 0.1 K/uL (ref 0.0–0.1)
Basophils Relative: 3 %
Eosinophils Absolute: 0.1 K/uL (ref 0.0–0.5)
Eosinophils Relative: 5 %
HCT: 40.9 % (ref 36.0–46.0)
Hemoglobin: 14.3 g/dL (ref 12.0–15.0)
Immature Granulocytes: 0 %
Lymphocytes Relative: 28 %
Lymphs Abs: 0.7 K/uL (ref 0.7–4.0)
MCH: 31.2 pg (ref 26.0–34.0)
MCHC: 35 g/dL (ref 30.0–36.0)
MCV: 89.3 fL (ref 80.0–100.0)
Monocytes Absolute: 0.2 K/uL (ref 0.1–1.0)
Monocytes Relative: 9 %
Neutro Abs: 1.4 K/uL — ABNORMAL LOW (ref 1.7–7.7)
Neutrophils Relative %: 55 %
Platelet Count: 148 K/uL — ABNORMAL LOW (ref 150–400)
RBC: 4.58 MIL/uL (ref 3.87–5.11)
RDW: 13.9 % (ref 11.5–15.5)
WBC Count: 2.6 K/uL — ABNORMAL LOW (ref 4.0–10.5)
nRBC: 0 % (ref 0.0–0.2)

## 2024-01-27 LAB — CMP (CANCER CENTER ONLY)
ALT: 15 U/L (ref 0–44)
AST: 17 U/L (ref 15–41)
Albumin: 4.5 g/dL (ref 3.5–5.0)
Alkaline Phosphatase: 82 U/L (ref 38–126)
Anion gap: 13 (ref 5–15)
BUN: 22 mg/dL (ref 8–23)
CO2: 22 mmol/L (ref 22–32)
Calcium: 9.2 mg/dL (ref 8.9–10.3)
Chloride: 104 mmol/L (ref 98–111)
Creatinine: 1.68 mg/dL — ABNORMAL HIGH (ref 0.44–1.00)
GFR, Estimated: 33 mL/min — ABNORMAL LOW
Glucose, Bld: 103 mg/dL — ABNORMAL HIGH (ref 70–99)
Potassium: 4.6 mmol/L (ref 3.5–5.1)
Sodium: 139 mmol/L (ref 135–145)
Total Bilirubin: 0.4 mg/dL (ref 0.0–1.2)
Total Protein: 7.3 g/dL (ref 6.5–8.1)

## 2024-01-27 LAB — LACTATE DEHYDROGENASE: LDH: 150 U/L (ref 105–235)

## 2024-01-28 LAB — KAPPA/LAMBDA LIGHT CHAINS
Kappa free light chain: 19 mg/L (ref 3.3–19.4)
Kappa, lambda light chain ratio: 1.24 (ref 0.26–1.65)
Lambda free light chains: 15.3 mg/L (ref 5.7–26.3)

## 2024-01-29 ENCOUNTER — Inpatient Hospital Stay

## 2024-01-30 LAB — MULTIPLE MYELOMA PANEL, SERUM
Albumin SerPl Elph-Mcnc: 4.1 g/dL (ref 2.9–4.4)
Albumin/Glob SerPl: 1.6 (ref 0.7–1.7)
Alpha 1: 0.2 g/dL (ref 0.0–0.4)
Alpha2 Glob SerPl Elph-Mcnc: 0.7 g/dL (ref 0.4–1.0)
B-Globulin SerPl Elph-Mcnc: 0.9 g/dL (ref 0.7–1.3)
Gamma Glob SerPl Elph-Mcnc: 0.9 g/dL (ref 0.4–1.8)
Globulin, Total: 2.7 g/dL (ref 2.2–3.9)
IgA: 68 mg/dL — ABNORMAL LOW (ref 87–352)
IgG (Immunoglobin G), Serum: 975 mg/dL (ref 586–1602)
IgM (Immunoglobulin M), Srm: 67 mg/dL (ref 26–217)
Total Protein ELP: 6.8 g/dL (ref 6.0–8.5)

## 2024-02-11 ENCOUNTER — Other Ambulatory Visit: Payer: Self-pay | Admitting: Hematology and Oncology

## 2024-02-11 ENCOUNTER — Other Ambulatory Visit: Payer: Self-pay | Admitting: *Deleted

## 2024-02-11 DIAGNOSIS — C9000 Multiple myeloma not having achieved remission: Secondary | ICD-10-CM

## 2024-02-11 MED ORDER — LENALIDOMIDE 2.5 MG PO CAPS: 2.5000 mg | ORAL_CAPSULE | ORAL | 0 refills | Status: AC

## 2024-02-17 ENCOUNTER — Inpatient Hospital Stay: Attending: Hematology and Oncology

## 2024-02-19 ENCOUNTER — Inpatient Hospital Stay

## 2024-03-09 ENCOUNTER — Inpatient Hospital Stay: Admitting: Hematology and Oncology

## 2024-03-09 ENCOUNTER — Inpatient Hospital Stay: Attending: Hematology and Oncology

## 2024-03-11 ENCOUNTER — Inpatient Hospital Stay: Admitting: Hematology and Oncology

## 2024-03-11 ENCOUNTER — Inpatient Hospital Stay

## 2024-03-30 ENCOUNTER — Inpatient Hospital Stay

## 2024-04-01 ENCOUNTER — Inpatient Hospital Stay

## 2024-04-12 ENCOUNTER — Encounter: Admitting: Adult Health

## 2024-04-20 ENCOUNTER — Inpatient Hospital Stay: Attending: Hematology and Oncology

## 2024-04-22 ENCOUNTER — Inpatient Hospital Stay

## 2024-05-18 ENCOUNTER — Inpatient Hospital Stay

## 2024-05-18 ENCOUNTER — Inpatient Hospital Stay: Attending: Hematology and Oncology | Admitting: Hematology and Oncology

## 2024-05-20 ENCOUNTER — Inpatient Hospital Stay: Admitting: Hematology and Oncology

## 2024-05-20 ENCOUNTER — Inpatient Hospital Stay
